# Patient Record
Sex: Male | Born: 1950
Health system: Southern US, Community
[De-identification: ages and names within clinical notes are randomized; demographics above are authoritative.]

## PROBLEM LIST (undated history)

## (undated) DIAGNOSIS — E785 Hyperlipidemia, unspecified: Secondary | ICD-10-CM

## (undated) DIAGNOSIS — M1990 Unspecified osteoarthritis, unspecified site: Secondary | ICD-10-CM

## (undated) DIAGNOSIS — I10 Essential (primary) hypertension: Secondary | ICD-10-CM

## (undated) DIAGNOSIS — T7840XA Allergy, unspecified, initial encounter: Secondary | ICD-10-CM

## (undated) HISTORY — PX: HERNIA REPAIR: SHX51

## (undated) HISTORY — DX: Allergy, unspecified, initial encounter: T78.40XA

## (undated) HISTORY — DX: Hyperlipidemia, unspecified: E78.5

## (undated) HISTORY — DX: Unspecified osteoarthritis, unspecified site: M19.90

---

## 2004-04-13 ENCOUNTER — Ambulatory Visit: Payer: Self-pay | Admitting: Internal Medicine

## 2004-05-13 ENCOUNTER — Ambulatory Visit: Payer: Self-pay | Admitting: Internal Medicine

## 2008-03-23 ENCOUNTER — Ambulatory Visit: Payer: Self-pay | Admitting: Internal Medicine

## 2008-03-24 ENCOUNTER — Ambulatory Visit: Payer: Self-pay | Admitting: *Deleted

## 2008-04-27 ENCOUNTER — Ambulatory Visit: Payer: Self-pay | Admitting: Internal Medicine

## 2008-04-27 ENCOUNTER — Encounter: Payer: Self-pay | Admitting: Family Medicine

## 2008-04-27 LAB — CONVERTED CEMR LAB
ALT: 18 units/L (ref 0–53)
AST: 17 units/L (ref 0–37)
Albumin: 4.9 g/dL (ref 3.5–5.2)
Alkaline Phosphatase: 71 units/L (ref 39–117)
BUN: 25 mg/dL — ABNORMAL HIGH (ref 6–23)
Basophils Absolute: 0 10*3/uL (ref 0.0–0.1)
Basophils Relative: 0 % (ref 0–1)
CO2: 21 meq/L (ref 19–32)
Calcium: 9.8 mg/dL (ref 8.4–10.5)
Chloride: 100 meq/L (ref 96–112)
Cholesterol: 268 mg/dL — ABNORMAL HIGH (ref 0–200)
Creatinine, Ser: 1.21 mg/dL (ref 0.40–1.50)
Eosinophils Absolute: 0.4 10*3/uL (ref 0.0–0.7)
Eosinophils Relative: 7 % — ABNORMAL HIGH (ref 0–5)
Glucose, Bld: 200 mg/dL — ABNORMAL HIGH (ref 70–99)
HCT: 40.6 % (ref 39.0–52.0)
HDL: 53 mg/dL (ref >39)
Hemoglobin: 14.1 g/dL (ref 13.0–17.0)
LDL Cholesterol: 191 mg/dL — ABNORMAL HIGH (ref 0–99)
Lymphocytes Relative: 46 % (ref 12–46)
Lymphs Abs: 2.6 10*3/uL (ref 0.7–4.0)
MCHC: 34.7 g/dL (ref 30.0–36.0)
MCV: 80.7 fL (ref 78.0–100.0)
Microalb, Ur: 1.04 mg/dL (ref 0.00–1.89)
Monocytes Absolute: 0.4 10*3/uL (ref 0.1–1.0)
Monocytes Relative: 7 % (ref 3–12)
Neutro Abs: 2.3 10*3/uL (ref 1.7–7.7)
Neutrophils Relative %: 40 % — ABNORMAL LOW (ref 43–77)
PSA: 2.27 ng/mL (ref 0.10–4.00)
Platelets: 345 10*3/uL (ref 150–400)
Potassium: 4.5 meq/L (ref 3.5–5.3)
RBC: 5.03 M/uL (ref 4.22–5.81)
RDW: 15 % (ref 11.5–15.5)
Sodium: 136 meq/L (ref 135–145)
TSH: 2.608 microintl units/mL (ref 0.350–4.50)
Total Bilirubin: 0.4 mg/dL (ref 0.3–1.2)
Total CHOL/HDL Ratio: 5.1
Total Protein: 7.9 g/dL (ref 6.0–8.3)
Triglycerides: 122 mg/dL (ref <150)
VLDL: 24 mg/dL (ref 0–40)
WBC: 5.8 10*3/uL (ref 4.0–10.5)

## 2008-05-13 ENCOUNTER — Emergency Department (HOSPITAL_COMMUNITY): Admission: EM | Admit: 2008-05-13 | Discharge: 2008-05-13 | Payer: Self-pay | Admitting: Family Medicine

## 2008-06-12 ENCOUNTER — Ambulatory Visit: Payer: Self-pay | Admitting: Internal Medicine

## 2008-06-29 ENCOUNTER — Ambulatory Visit: Payer: Self-pay | Admitting: Internal Medicine

## 2008-06-29 ENCOUNTER — Ambulatory Visit: Payer: Self-pay | Admitting: Family Medicine

## 2009-01-21 ENCOUNTER — Ambulatory Visit: Payer: Self-pay | Admitting: Internal Medicine

## 2009-02-17 ENCOUNTER — Ambulatory Visit: Payer: Self-pay | Admitting: Internal Medicine

## 2009-04-19 ENCOUNTER — Ambulatory Visit: Payer: Self-pay | Admitting: Internal Medicine

## 2009-09-16 ENCOUNTER — Ambulatory Visit: Payer: Self-pay | Admitting: Internal Medicine

## 2009-10-21 ENCOUNTER — Ambulatory Visit: Payer: Self-pay | Admitting: Internal Medicine

## 2009-10-21 LAB — CONVERTED CEMR LAB: Microalb, Ur: 11.65 mg/dL — ABNORMAL HIGH (ref 0.00–1.89)

## 2009-11-11 ENCOUNTER — Ambulatory Visit: Payer: Self-pay | Admitting: Internal Medicine

## 2009-11-11 LAB — CONVERTED CEMR LAB
BUN: 24 mg/dL — ABNORMAL HIGH (ref 6–23)
CO2: 24 meq/L (ref 19–32)
Calcium: 10.4 mg/dL (ref 8.4–10.5)
Chloride: 100 meq/L (ref 96–112)
Cholesterol: 196 mg/dL (ref 0–200)
Creatinine, Ser: 0.97 mg/dL (ref 0.40–1.50)
Glucose, Bld: 161 mg/dL — ABNORMAL HIGH (ref 70–99)
HDL: 65 mg/dL (ref >39)
LDL Cholesterol: 108 mg/dL — ABNORMAL HIGH (ref 0–99)
Magnesium: 1.9 mg/dL (ref 1.5–2.5)
Potassium: 4.4 meq/L (ref 3.5–5.3)
Sodium: 136 meq/L (ref 135–145)
Total CHOL/HDL Ratio: 3
Triglycerides: 114 mg/dL (ref <150)
VLDL: 23 mg/dL (ref 0–40)

## 2009-11-18 ENCOUNTER — Ambulatory Visit: Payer: Self-pay | Admitting: Internal Medicine

## 2010-01-20 ENCOUNTER — Ambulatory Visit: Payer: Self-pay | Admitting: Internal Medicine

## 2010-04-20 ENCOUNTER — Encounter (INDEPENDENT_AMBULATORY_CARE_PROVIDER_SITE_OTHER): Payer: Self-pay | Admitting: Internal Medicine

## 2010-04-20 LAB — CONVERTED CEMR LAB
BUN: 15 mg/dL (ref 6–23)
CO2: 23 meq/L (ref 19–32)
Calcium: 10.1 mg/dL (ref 8.4–10.5)
Chloride: 99 meq/L (ref 96–112)
Cholesterol: 177 mg/dL (ref 0–200)
Creatinine, Ser: 1.03 mg/dL (ref 0.40–1.50)
Glucose, Bld: 163 mg/dL — ABNORMAL HIGH (ref 70–99)
HDL: 108 mg/dL (ref >39)
Hgb A1c MFr Bld: 7 % — ABNORMAL HIGH (ref <5.7)
LDL Cholesterol: 50 mg/dL (ref 0–99)
Potassium: 4.2 meq/L (ref 3.5–5.3)
Pro B Natriuretic peptide (BNP): 8.5 pg/mL (ref 0.0–100.0)
Sodium: 135 meq/L (ref 135–145)
Total CHOL/HDL Ratio: 1.6
Triglycerides: 93 mg/dL (ref <150)
VLDL: 19 mg/dL (ref 0–40)

## 2011-06-14 ENCOUNTER — Emergency Department (HOSPITAL_COMMUNITY): Payer: Self-pay

## 2011-06-14 ENCOUNTER — Emergency Department (HOSPITAL_COMMUNITY)
Admission: EM | Admit: 2011-06-14 | Discharge: 2011-06-14 | Disposition: A | Discharge status: Home / Self Care | Payer: Self-pay | Attending: Emergency Medicine | Admitting: Emergency Medicine

## 2011-06-14 DIAGNOSIS — J3489 Other specified disorders of nose and nasal sinuses: Secondary | ICD-10-CM | POA: Insufficient documentation

## 2011-06-14 DIAGNOSIS — R05 Cough: Secondary | ICD-10-CM | POA: Insufficient documentation

## 2011-06-14 DIAGNOSIS — Z7982 Long term (current) use of aspirin: Secondary | ICD-10-CM | POA: Insufficient documentation

## 2011-06-14 DIAGNOSIS — J069 Acute upper respiratory infection, unspecified: Secondary | ICD-10-CM | POA: Insufficient documentation

## 2011-06-14 DIAGNOSIS — R51 Headache: Secondary | ICD-10-CM | POA: Insufficient documentation

## 2011-06-14 DIAGNOSIS — Z79899 Other long term (current) drug therapy: Secondary | ICD-10-CM | POA: Insufficient documentation

## 2011-06-14 DIAGNOSIS — J40 Bronchitis, not specified as acute or chronic: Secondary | ICD-10-CM | POA: Insufficient documentation

## 2011-06-14 DIAGNOSIS — R062 Wheezing: Secondary | ICD-10-CM | POA: Insufficient documentation

## 2011-06-14 DIAGNOSIS — R070 Pain in throat: Secondary | ICD-10-CM | POA: Insufficient documentation

## 2011-06-14 DIAGNOSIS — R599 Enlarged lymph nodes, unspecified: Secondary | ICD-10-CM | POA: Insufficient documentation

## 2011-06-14 DIAGNOSIS — E119 Type 2 diabetes mellitus without complications: Secondary | ICD-10-CM | POA: Insufficient documentation

## 2011-06-14 DIAGNOSIS — I10 Essential (primary) hypertension: Secondary | ICD-10-CM | POA: Insufficient documentation

## 2011-06-14 DIAGNOSIS — R059 Cough, unspecified: Secondary | ICD-10-CM | POA: Insufficient documentation

## 2011-06-14 HISTORY — DX: Essential (primary) hypertension: I10

## 2011-06-14 MED ORDER — ALBUTEROL SULFATE HFA 108 (90 BASE) MCG/ACT IN AERS: 2.0000 | INHALATION_SPRAY | RESPIRATORY_TRACT | Status: DC | PRN

## 2011-06-14 MED ORDER — AEROCHAMBER PLUS MISC: Status: AC

## 2011-06-14 NOTE — ED Notes (Signed)
Pt presents with 4-5 day  H/o productive cough with yellow phlegm at onset, but now is white.  +nasal congestion +chills, denies any body aches.

## 2011-06-14 NOTE — ED Notes (Signed)
No active coughing noted. No distress noted. Pt sitting on bed quietly

## 2011-06-14 NOTE — ED Provider Notes (Signed)
History     CSN: [id number] Arrival date & time: 06/14/2011  1:44 PM   First MD Initiated Contact with Patient 06/14/11 1702      Chief Complaint  Patient presents with  . Cough    (Consider location/radiation/quality/duration/timing/severity/associated sxs/prior treatment) Patient is a 60 y.o. male presenting with cough. The history is provided by the patient.  Cough This is a new problem. The current episode started more than 2 days ago (about 1 week ago). The problem occurs constantly. The problem has not changed since onset.The cough is productive of sputum. Maximum temperature: subjective. Associated symptoms include headaches and sore throat. Pertinent negatives include no chest pain, no chills, no myalgias and no shortness of breath. He has tried nothing for the symptoms. The treatment provided no relief.    Past Medical History  Diagnosis Date  . Diabetes mellitus   . Hypertension     History reviewed. No pertinent past surgical history.  No family history on file.  History  Substance Use Topics  . Smoking status: Never Smoker   . Smokeless tobacco: Not on file  . Alcohol Use: No      Review of Systems  Unable to perform ROS Constitutional: Negative for fever and chills.  HENT: Positive for congestion and sore throat. Negative for neck pain and neck stiffness.   Respiratory: Positive for cough. Negative for shortness of breath.   Cardiovascular: Negative for chest pain and palpitations.  Gastrointestinal: Negative for nausea, vomiting and abdominal pain.  Musculoskeletal: Negative for myalgias and arthralgias.  Skin: Negative for color change and rash.  Neurological: Positive for headaches. Negative for weakness and light-headedness.  Psychiatric/Behavioral: Negative for confusion and decreased concentration.  All other systems reviewed and are negative.    Allergies  Review of patient's allergies indicates no known allergies.  Home Medications    Current Outpatient Rx  Name Route Sig Dispense Refill  . AMLODIPINE BESYLATE 10 MG PO TABS Oral Take 10 mg by mouth daily.      . ASPIRIN EC 81 MG PO TBEC Oral Take 81 mg by mouth daily.      Marland Kitchen GLIPIZIDE ER 10 MG PO TB24 Oral Take 10 mg by mouth daily.      Marland Kitchen LOSARTAN POTASSIUM-HCTZ 50-12.5 MG PO TABS Oral Take 1 tablet by mouth daily.      Marland Kitchen METFORMIN HCL 1000 MG PO TABS Oral Take 1,000 mg by mouth 2 (two) times daily with a meal.      . PRAVASTATIN SODIUM 20 MG PO TABS Oral Take 20 mg by mouth daily.        BP 119/75  Pulse 99  Temp(Src) 99 F (37.2 C) (Oral)  Resp 18  SpO2 96%  Physical Exam  Nursing note and vitals reviewed. Constitutional: He is oriented to person, place, and time. He appears well-developed and well-nourished.  HENT:  Head: Normocephalic and atraumatic.  Eyes: EOM are normal. Pupils are equal, round, and reactive to light.  Neck: Normal range of motion. Neck supple.  Cardiovascular: Normal rate and regular rhythm.   Pulmonary/Chest: Effort normal. No respiratory distress. He has wheezes (mild, diffuse).  Abdominal: Soft. There is no tenderness.  Lymphadenopathy:    He has cervical adenopathy.  Neurological: He is alert and oriented to person, place, and time.  Skin: Skin is warm and dry.  Psychiatric: He has a normal mood and affect.    ED Course  Procedures (including critical care time)  Labs Reviewed - No data to  display Dg Chest 2 View  06/14/2011  *RADIOLOGY REPORT*  Clinical Data: Productive cough and chills.  High blood pressure. Diabetic.  Nonsmoker.  CHEST - 2 VIEW  Comparison: None.  Findings: Peribronchial thickening may represent bronchitis type changes without segmental consolidation.  Central pulmonary vascular prominence without pulmonary edema.  Biapical pleural thickening without associate bony destruction.  No pneumothorax.  Mild prominence aortic knob.  Heart size within normal limits.  IMPRESSION: Peribronchial thickening may  represent mild bronchitis type changes without evidence of a segmental infiltrate.  Original Report Authenticated By: Fuller Canada, M.D.     1. Bronchitis   2. URI (upper respiratory infection)       MDM  This is a 60 year old male who presents with about one week of gradually worsening productive cough, accompanied by nasal congestion, sore throat, subjective fevers, and intermittent headache. Denies any chest pain, shortness of breath, abdominal pain, nausea vomiting, neck pain or stiffness, or any other complaints. Exam is remarkable for mild wheezing diffusely, mild tender anterior cervical lymphadenopathy mild pharyngeal erythema, no neck stiffness or meningeal signs. Suspicion for probable viral syndrome, however due to a week of gradually worsening cough, will get a chest x-ray to evaluate for possible underlying pneumonia.  Chest x-ray is unremarkable. Discussed with patient symptomatic treatment, including use of inhaler for wheezing and persistent cough, and indications for return. Patient expresses understanding of this plan.      Theotis Burrow, MD 06/15/11 [phone]

## 2011-06-15 NOTE — ED Provider Notes (Signed)
I saw and evaluated the patient, reviewed the resident's note and I agree with the findings and plan.   Dione Booze, MD 06/15/11 1046

## 2012-06-13 ENCOUNTER — Encounter (HOSPITAL_COMMUNITY): Payer: Self-pay

## 2012-06-13 ENCOUNTER — Emergency Department (HOSPITAL_COMMUNITY)
Admission: EM | Admit: 2012-06-13 | Discharge: 2012-06-13 | Disposition: A | Discharge status: Home / Self Care | Payer: No Typology Code available for payment source | Source: Home / Self Care

## 2012-06-13 DIAGNOSIS — E119 Type 2 diabetes mellitus without complications: Secondary | ICD-10-CM

## 2012-06-13 DIAGNOSIS — I10 Essential (primary) hypertension: Secondary | ICD-10-CM

## 2012-06-13 MED ORDER — ASPIRIN EC 81 MG PO TBEC: 81.0000 mg | DELAYED_RELEASE_TABLET | Freq: Every day | ORAL | Status: DC

## 2012-06-13 MED ORDER — AMLODIPINE BESYLATE 10 MG PO TABS: 10.0000 mg | ORAL_TABLET | Freq: Every day | ORAL | Status: DC

## 2012-06-13 MED ORDER — METFORMIN HCL 1000 MG PO TABS: 1000.0000 mg | ORAL_TABLET | Freq: Two times a day (BID) | ORAL | Status: DC

## 2012-06-13 MED ORDER — LOSARTAN POTASSIUM-HCTZ 50-12.5 MG PO TABS: 1.0000 | ORAL_TABLET | Freq: Every day | ORAL | Status: DC

## 2012-06-13 MED ORDER — PRAVASTATIN SODIUM 20 MG PO TABS: 20.0000 mg | ORAL_TABLET | Freq: Every day | ORAL | Status: DC

## 2012-06-13 MED ORDER — GLIPIZIDE ER 10 MG PO TB24: 10.0000 mg | ORAL_TABLET | Freq: Every day | ORAL | Status: DC

## 2012-06-13 NOTE — ED Notes (Signed)
[facility] needing medication refill

## 2012-06-13 NOTE — ED Provider Notes (Signed)
Patient Demographics  Jerry Serrano, is a 61 y.o. male  WUJ:[id number]  NFA:[id number]  DOB - 27-Feb-1951  Chief Complaint  Patient presents with  . Medication Refill        Subjective:   Jerry Serrano is a 61 year old with history of hypertension diabetes and hyperlipidemia who presents for medication refill. Denies any complaints - No headache, No chest pain, No abdominal pain - No Nausea, No new weakness tingling or numbness, No Cough - SOB.   Objective:    Filed Vitals:   06/13/12 1642  BP: 121/82  Pulse: 75  Temp: 98.4 F (36.9 C)  TempSrc: Oral  Resp: 20  SpO2: 100%     Exam  Awake Alert, Oriented X 3, nonfocal, Normal affect [place].AT,PERRAL Supple Neck,No JVD, No cervical lymphadenopathy appriciated.  Symmetrical Chest wall movement, Good air movement bilaterally, CTAB RRR,No Gallops,Rubs or new Murmurs, No Parasternal Heave +ve B.Sounds, Abd Soft, Non tender, No organomegaly appreciated, No rebound - guarding or rigidity. No Cyanosis, Clubbing or edema, no rash    Data Review   CBC No results found for this basename: WBC:5,HGB:5,HCT:5,PLT:5,MCV:5,MCH:5,MCHC:5,RDW:5,NEUTRABS:5,LYMPHSABS:5,MONOABS:5,EOSABS:5,BASOSABS:5,BANDABS:5,BANDSABD:5 in the last 168 hours  Chemistries   No results found for this basename: NA:5,K:5,CL:5,CO2:5,GLUCOSE:5,BUN:5,CREATININE:5,GFRCGP,:5,CALCIUM:5,MG:5,AST:5,ALT:5,ALKPHOS:5,BILITOT:5 in the last 168 hours ------------------------------------------------------------------------------------------------------------------ No results found for this basename: HGBA1C:2 in the last 72 hours ------------------------------------------------------------------------------------------------------------------ No results found for this basename: CHOL:2,HDL:2,LDLCALC:2,TRIG:2,CHOLHDL:2,LDLDIRECT:2 in the last 72  hours ------------------------------------------------------------------------------------------------------------------ No results found for this basename: TSH,T4TOTAL,FREET3,T3FREE,THYROIDAB in the last 72 hours ------------------------------------------------------------------------------------------------------------------ No results found for this basename: VITAMINB12:2,FOLATE:2,FERRITIN:2,TIBC:2,IRON:2,RETICCTPCT:2 in the last 72 hours  Coagulation profile  No results found for this basename: INR:5,PROTIME:5 in the last 168 hours     Prior to Admission medications   Medication Sig Start Date End Date Taking? Authorizing Provider  albuterol (PROVENTIL HFA;VENTOLIN HFA) 108 (90 BASE) MCG/ACT inhaler Inhale 2 puffs into the lungs every 4 (four) hours as needed for wheezing or shortness of breath (cough). 06/14/11 06/13/12  Katie Mahoney-Tesoriero, MD  amLODipine (NORVASC) 10 MG tablet Take 1 tablet (10 mg total) by mouth daily. 06/13/12   Kela Millin, MD  aspirin EC 81 MG tablet Take 1 tablet (81 mg total) by mouth daily. 06/13/12   Kela Millin, MD  glipiZIDE (GLUCOTROL XL) 10 MG 24 hr tablet Take 1 tablet (10 mg total) by mouth daily. 06/13/12   Kela Millin, MD  losartan-hydrochlorothiazide (HYZAAR) 50-12.5 MG per tablet Take 1 tablet by mouth daily. 06/13/12   Kela Millin, MD  metFORMIN (GLUCOPHAGE) 1000 MG tablet Take 1 tablet (1,000 mg total) by mouth 2 (two) times daily with a meal. 06/13/12   Ercelle Winkles C Jonanthan Bolender, MD  pravastatin (PRAVACHOL) 20 MG tablet Take 1 tablet (20 mg total) by mouth daily. 06/13/12   Kela Millin, MD  Spacer/Aero-Holding Chambers (AEROCHAMBER PLUS) inhaler Use as instructed 06/14/11 06/13/12  Theotis Burrow, MD     Assessment & Plan   Hypertension -well controlled, continue current medication -I have refilled all his antihypertensives, his to follow up in 2 months with lab work Diabetes mellitus -Continue metformin, as above  followup as above Hyperlipidemia -Continue pravastatin Follow-up Information    Please follow up. (In 2 months at [facility] with labs)           Denver Faster.D on 06/13/2012 at 5:46 PM   Kela Millin, MD 06/13/12 1750

## 2012-08-02 MED ORDER — LOSARTAN POTASSIUM-HCTZ 50-12.5 MG PO TABS: 1.0000 | ORAL_TABLET | Freq: Every day | ORAL | Status: DC

## 2012-08-02 MED ORDER — METFORMIN HCL 1000 MG PO TABS: 1000.0000 mg | ORAL_TABLET | Freq: Two times a day (BID) | ORAL | Status: DC

## 2012-08-02 MED ORDER — PRAVASTATIN SODIUM 20 MG PO TABS: 20.0000 mg | ORAL_TABLET | Freq: Every day | ORAL | Status: DC

## 2012-08-02 MED ORDER — AMLODIPINE BESYLATE 10 MG PO TABS: 10.0000 mg | ORAL_TABLET | Freq: Every day | ORAL | Status: DC

## 2012-08-02 MED ORDER — GLIPIZIDE ER 10 MG PO TB24: 10.0000 mg | ORAL_TABLET | Freq: Every day | ORAL | Status: DC

## 2012-08-02 NOTE — Progress Notes (Signed)
Pt called requesting refills of meds to [facility] on [address].  Rxs sent to pharmacy for chronic meds.  Pt advised to followup in 1 month for regular medical recheck of chronic medical conditions.  I reviewed note from Dr. Donna Bernard.     Rodney Langton, MD, CDE, FAAFP [facility] [place], [place]

## 2012-08-05 MED ORDER — GLIPIZIDE ER 10 MG PO TB24: 10.0000 mg | ORAL_TABLET | Freq: Every day | ORAL | Status: DC

## 2012-08-05 MED ORDER — LOSARTAN POTASSIUM-HCTZ 50-12.5 MG PO TABS: 1.0000 | ORAL_TABLET | Freq: Every day | ORAL | Status: DC

## 2012-08-08 ENCOUNTER — Encounter (HOSPITAL_COMMUNITY): Payer: Self-pay

## 2012-08-08 ENCOUNTER — Emergency Department (INDEPENDENT_AMBULATORY_CARE_PROVIDER_SITE_OTHER)
Admission: EM | Admit: 2012-08-08 | Discharge: 2012-08-08 | Disposition: A | Discharge status: Home / Self Care | Payer: No Typology Code available for payment source | Source: Home / Self Care | Attending: Family Medicine | Admitting: Family Medicine

## 2012-08-08 DIAGNOSIS — Z23 Encounter for immunization: Secondary | ICD-10-CM

## 2012-08-08 DIAGNOSIS — E785 Hyperlipidemia, unspecified: Secondary | ICD-10-CM

## 2012-08-08 DIAGNOSIS — I10 Essential (primary) hypertension: Secondary | ICD-10-CM

## 2012-08-08 DIAGNOSIS — K029 Dental caries, unspecified: Secondary | ICD-10-CM

## 2012-08-08 LAB — LIPID PANEL
Cholesterol: 195 mg/dL (ref 0–200)
HDL: 54 mg/dL (ref >39)
LDL Cholesterol: 120 mg/dL — ABNORMAL HIGH (ref 0–99)
Total CHOL/HDL Ratio: 3.6 RATIO
Triglycerides: 104 mg/dL (ref <150)
VLDL: 21 mg/dL (ref 0–40)

## 2012-08-08 LAB — COMPREHENSIVE METABOLIC PANEL
ALT: 16 U/L (ref 0–53)
AST: 17 U/L (ref 0–37)
Albumin: 4.2 g/dL (ref 3.5–5.2)
Alkaline Phosphatase: 100 U/L (ref 39–117)
BUN: 10 mg/dL (ref 6–23)
CO2: 25 mEq/L (ref 19–32)
Calcium: 9.9 mg/dL (ref 8.4–10.5)
Chloride: 105 mEq/L (ref 96–112)
Creatinine, Ser: 0.72 mg/dL (ref 0.50–1.35)
GFR calc Af Amer: >90 mL/min (ref >90)
GFR calc non Af Amer: >90 mL/min (ref >90)
Glucose, Bld: 200 mg/dL — ABNORMAL HIGH (ref 70–99)
Potassium: 4.6 mEq/L (ref 3.5–5.1)
Sodium: 139 mEq/L (ref 135–145)
Total Bilirubin: 0.3 mg/dL (ref 0.3–1.2)
Total Protein: 7.8 g/dL (ref 6.0–8.3)

## 2012-08-08 MED ORDER — PRAVASTATIN SODIUM 20 MG PO TABS: 20.0000 mg | ORAL_TABLET | Freq: Every day | ORAL | Status: DC

## 2012-08-08 MED ORDER — GLIPIZIDE ER 10 MG PO TB24: 10.0000 mg | ORAL_TABLET | Freq: Every day | ORAL | Status: DC

## 2012-08-08 MED ORDER — INFLUENZA VIRUS VACC SPLIT PF IM SUSP
0.5000 mL | Freq: Once | INTRAMUSCULAR | Status: AC
  Administered 2012-08-08: 0.5 mL via INTRAMUSCULAR

## 2012-08-08 MED ORDER — LOSARTAN POTASSIUM-HCTZ 50-12.5 MG PO TABS: 1.0000 | ORAL_TABLET | Freq: Every day | ORAL | Status: DC

## 2012-08-08 MED ORDER — METFORMIN HCL 1000 MG PO TABS: 1000.0000 mg | ORAL_TABLET | Freq: Two times a day (BID) | ORAL | Status: DC

## 2012-08-08 MED ORDER — AMLODIPINE BESYLATE 10 MG PO TABS: 10.0000 mg | ORAL_TABLET | Freq: Every day | ORAL | Status: DC

## 2012-08-08 NOTE — ED Notes (Signed)
REFERRAL FAXED TO [facility] 

## 2012-08-08 NOTE — ED Provider Notes (Signed)
History   CSN: [id number]  Arrival date & time 08/08/12  1626   First MD Initiated Contact with Patient 08/08/12 1639     Chief Complaint  Patient presents with  . Medication Refill   HPI Pt presenting to follow up and to have his medications refilled.  Pt says that he has been pleased that his BP has remained well controlled on the medications.  His glycemic control has been good.  No hypoglycemia reported.   Pt reports that he would like to get a flu vaccine today.    Past Medical History  Diagnosis Date  . Diabetes mellitus   . Hypertension    History reviewed. No pertinent past surgical history.  No family history on file.  History  Substance Use Topics  . Smoking status: Never Smoker   . Smokeless tobacco: Not on file  . Alcohol Use: No    Review of Systems  Constitutional: Positive for fatigue.  HENT: Positive for congestion.   All other systems reviewed and are negative.    Allergies  Review of patient's allergies indicates no known allergies.  Home Medications   Current Outpatient Rx  Name  Route  Sig  Dispense  Refill  . ALBUTEROL SULFATE HFA 108 (90 BASE) MCG/ACT IN AERS   Inhalation   Inhale 2 puffs into the lungs every 4 (four) hours as needed for wheezing or shortness of breath (cough).   3.7 g   0   . AMLODIPINE BESYLATE 10 MG PO TABS   Oral   Take 1 tablet (10 mg total) by mouth daily.   30 tablet   3   . ASPIRIN EC 81 MG PO TBEC   Oral   Take 1 tablet (81 mg total) by mouth daily.   30 tablet   0   . GLIPIZIDE ER 10 MG PO TB24   Oral   Take 1 tablet (10 mg total) by mouth daily.   30 tablet   3   . LOSARTAN POTASSIUM-HCTZ 50-12.5 MG PO TABS   Oral   Take 1 tablet by mouth daily.   30 tablet   3   . METFORMIN HCL 1000 MG PO TABS   Oral   Take 1 tablet (1,000 mg total) by mouth 2 (two) times daily with a meal.   60 tablet   0   . PRAVASTATIN SODIUM 20 MG PO TABS   Oral   Take 1 tablet (20 mg total) by mouth daily.   30  tablet   3     BP 117/80  Pulse 76  Temp 97.8 F (36.6 C) (Oral)  Resp 18  SpO2 99%  Physical Exam  Nursing note and vitals reviewed. Constitutional: He is oriented to person, place, and time. He appears well-developed and well-nourished. No distress.  HENT:  Head: Normocephalic and atraumatic.  Right Ear: External ear normal.  Left Ear: External ear normal.  Mouth/Throat: Mucous membranes are normal. Abnormal dentition. Dental caries present. No posterior oropharyngeal erythema or tonsillar abscesses.  Eyes: Conjunctivae normal and EOM are normal. Pupils are equal, round, and reactive to light.  Neck: Normal range of motion. Neck supple. No JVD present. No thyromegaly present.  Cardiovascular: Normal rate, regular rhythm and normal heart sounds.   Pulmonary/Chest: Effort normal and breath sounds normal.  Abdominal: Soft. Bowel sounds are normal. He exhibits no distension and no mass. There is no tenderness. There is no rebound and no guarding.  Musculoskeletal: Normal range of motion.  Neurological:  He is alert and oriented to person, place, and time. He has normal reflexes.  Skin: Skin is warm and dry. No erythema.  Psychiatric: He has a normal mood and affect. His behavior is normal. Judgment and thought content normal.    ED Course  Procedures (including critical care time)  Labs Reviewed - No data to display No results found.   No diagnosis found.  MDM  IMPRESSION  Hypertension  Diabetes Mellitus  Hyperlipidemia  Dental Caries   RECOMMENDATIONS / PLAN Dental Referral made Check labs today Refilled medications Flu vaccine provided  FOLLOW UP 3 months   The patient was given clear instructions to go to ER or return to medical center if symptoms don't improve, worsen or new problems develop.  The patient verbalized understanding.  The patient was told to call to get lab results if they haven't heard anything in the next week.           Cleora Fleet, MD 08/08/12 1712

## 2012-08-08 NOTE — ED Notes (Signed)
Medication refill

## 2012-08-09 LAB — TSH: TSH: 2.122 u[IU]/mL (ref 0.350–4.500)

## 2012-08-09 LAB — HEMOGLOBIN A1C
Hgb A1c MFr Bld: 8.6 % — ABNORMAL HIGH (ref <5.7)
Mean Plasma Glucose: 200 mg/dL — ABNORMAL HIGH (ref <117)

## 2012-08-09 NOTE — Progress Notes (Signed)
Quick Note:  Please notify patient that his blood work came back revealing that his diabetes is poorly controlled as evidenced by hemoglobin A1c of 8.6%. This may be because the patient had been without his medications for a short time. We need to recheck his blood sugar and A1c in 3 months. Please send him some patient education information on diabetes diet plans and exercise plans and meal planning. His other labs came back okay except that his cholesterol was elevated. His LDL cholesterol was greater than 100. For diabetic patients the LDL cholesterol goal is less than 100. Please take the pravastatin regularly and we will recheck his lab work in 3 months.   Rodney Langton, MD, CDE, FAAFP [facility] [place], [place]   ______

## 2012-08-27 NOTE — ED Notes (Signed)
[facility] and pt. Stated he had no time for [place] and wanted off the list.

## 2012-12-09 ENCOUNTER — Ambulatory Visit: Payer: No Typology Code available for payment source | Attending: Family Medicine | Admitting: Family Medicine

## 2012-12-09 VITALS — BP 105/69 | HR 80 | Temp 98.4°F | Resp 17 | Wt 139.6 lb

## 2012-12-09 DIAGNOSIS — I10 Essential (primary) hypertension: Secondary | ICD-10-CM

## 2012-12-09 DIAGNOSIS — IMO0001 Reserved for inherently not codable concepts without codable children: Secondary | ICD-10-CM

## 2012-12-09 DIAGNOSIS — E119 Type 2 diabetes mellitus without complications: Secondary | ICD-10-CM | POA: Insufficient documentation

## 2012-12-09 DIAGNOSIS — E785 Hyperlipidemia, unspecified: Secondary | ICD-10-CM

## 2012-12-09 DIAGNOSIS — K029 Dental caries, unspecified: Secondary | ICD-10-CM

## 2012-12-09 MED ORDER — LOSARTAN POTASSIUM-HCTZ 50-12.5 MG PO TABS: 1.0000 | ORAL_TABLET | Freq: Every day | ORAL | Status: DC

## 2012-12-09 MED ORDER — GLIPIZIDE ER 10 MG PO TB24: 10.0000 mg | ORAL_TABLET | Freq: Every day | ORAL | Status: DC

## 2012-12-09 MED ORDER — METFORMIN HCL 1000 MG PO TABS: 1000.0000 mg | ORAL_TABLET | Freq: Two times a day (BID) | ORAL | Status: DC

## 2012-12-09 MED ORDER — PRAVASTATIN SODIUM 40 MG PO TABS: 40.0000 mg | ORAL_TABLET | Freq: Every day | ORAL | Status: DC

## 2012-12-09 MED ORDER — AMLODIPINE BESYLATE 10 MG PO TABS: 10.0000 mg | ORAL_TABLET | Freq: Every day | ORAL | Status: DC

## 2012-12-09 NOTE — Progress Notes (Signed)
Patient ID: Jerry Serrano, male   DOB: 1951-02-08, 62 y.o.   MRN: [id number]  CC: follow up DM, HTN  HPI: PT without complaints.  He did see a dentist.  His blood sugar has been good per pt.  He says BS was 108-130.  BP has been well controlled on meds.   No Known Allergies Past Medical History  Diagnosis Date  . Diabetes mellitus   . Hypertension    Current Outpatient Prescriptions on File Prior to Visit  Medication Sig Dispense Refill  . albuterol (PROVENTIL HFA;VENTOLIN HFA) 108 (90 BASE) MCG/ACT inhaler Inhale 2 puffs into the lungs every 4 (four) hours as needed for wheezing or shortness of breath (cough).  3.7 g  0  . aspirin EC 81 MG tablet Take 1 tablet (81 mg total) by mouth daily.  30 tablet  0   No current facility-administered medications on file prior to visit.   History reviewed. No pertinent family history. History   Social History  . Marital Status: Married    Spouse Name: N/A    Number of Children: N/A  . Years of Education: N/A   Occupational History  . Not on file.   Social History Main Topics  . Smoking status: Never Smoker   . Smokeless tobacco: Not on file  . Alcohol Use: No  . Drug Use: No  . Sexually Active:    Other Topics Concern  . Not on file   Social History Narrative  . No narrative on file    Review of Systems  Constitutional: Negative for fever, chills, diaphoresis, activity change, appetite change and fatigue.  HENT: Negative for ear pain, nosebleeds, congestion, facial swelling, rhinorrhea, neck pain, neck stiffness and ear discharge.   Eyes: Negative for pain, discharge, redness, itching and visual disturbance.  Respiratory: Negative for cough, choking, chest tightness, shortness of breath, wheezing and stridor.   Cardiovascular: Negative for chest pain, palpitations and leg swelling.  Gastrointestinal: Negative for abdominal distention.  Genitourinary: Negative for dysuria, urgency, frequency, hematuria, flank pain,  decreased urine volume, difficulty urinating and dyspareunia.  Musculoskeletal: Negative for back pain, joint swelling, arthralgias and gait problem.  Neurological: Negative for dizziness, tremors, seizures, syncope, facial asymmetry, speech difficulty, weakness, light-headedness, numbness and headaches.  Hematological: Negative for adenopathy. Does not bruise/bleed easily.  Psychiatric/Behavioral: Negative for hallucinations, behavioral problems, confusion, dysphoric mood, decreased concentration and agitation.    Objective:   Filed Vitals:   12/09/12 1746  BP: 105/69  Pulse: 80  Temp: 98.4 F (36.9 C)  Resp: 17    Physical Exam  Constitutional: Appears well-developed and well-nourished. No distress.  HENT: Normocephalic. External right and left ear normal. Oropharynx is clear and moist. dental caries Eyes: Conjunctivae and EOM are normal. PERRLA, no scleral icterus.  Neck: Normal ROM. Neck supple. No JVD. No tracheal deviation. No thyromegaly.  CVS: RRR, S1/S2 +, no murmurs, no gallops, no carotid bruit.  Pulmonary: Effort and breath sounds normal, no stridor, rhonchi, wheezes, rales.  Abdominal: Soft. BS +,  no distension, tenderness, rebound or guarding.  Musculoskeletal: Normal range of motion. No edema and no tenderness.  Lymphadenopathy: No lymphadenopathy noted, cervical, inguinal. Neuro: Alert. Normal reflexes, muscle tone coordination. No cranial nerve deficit. Skin: Skin is warm and dry. No rash noted. Not diaphoretic. No erythema. No pallor.  Psychiatric: Normal mood and affect. Behavior, judgment, thought content normal.   Lab Results  Component Value Date   WBC 5.8 04/27/2008   HGB 14.1 04/27/2008   HCT  40.6 04/27/2008   MCV 80.7 04/27/2008   PLT 345 04/27/2008   Lab Results  Component Value Date   CREATININE 0.72 08/08/2012   BUN 10 08/08/2012   NA 139 08/08/2012   K 4.6 08/08/2012   CL 105 08/08/2012   CO2 25 08/08/2012    Lab Results  Component Value Date    HGBA1C 8.6* 08/08/2012   Lipid Panel     Component Value Date/Time   CHOL 195 08/08/2012 1649   TRIG 104 08/08/2012 1649   HDL 54 08/08/2012 1649   CHOLHDL 3.6 08/08/2012 1649   VLDL 21 08/08/2012 1649   LDLCALC 120* 08/08/2012 1649       Assessment and plan:   Patient Active Problem List   Diagnosis Date Noted  . Unspecified essential hypertension 12/09/2012  . Type II or unspecified type diabetes mellitus without mention of complication, uncontrolled 12/09/2012  . Dental caries 12/09/2012   Refilled meds Check labs today Recommended pt see dentist   The patient was given clear instructions to go to ER or return to medical center if symptoms don't improve, worsen or new problems develop.  The patient verbalized understanding.  The patient was told to call to get lab results if they haven't heard anything in the next week.    Rodney Langton, MD, CDE, FAAFP [facility] [place], [place]

## 2012-12-09 NOTE — Patient Instructions (Addendum)
DASH Diet The DASH diet stands for "Dietary Approaches to Stop Hypertension." It is a healthy eating plan that has been shown to reduce high blood pressure (hypertension) in as little as 14 days, while also possibly providing other significant health benefits. These other health benefits include reducing the risk of breast cancer after menopause and reducing the risk of type 2 diabetes, heart disease, colon cancer, and stroke. Health benefits also include weight loss and slowing kidney failure in patients with chronic kidney disease.  DIET GUIDELINES  Limit salt (sodium). Your diet should contain less than 1500 mg of sodium daily.  Limit refined or processed carbohydrates. Your diet should include mostly whole grains. Desserts and added sugars should be used sparingly.  Include small amounts of heart-healthy fats. These types of fats include nuts, oils, and tub margarine. Limit saturated and trans fats. These fats have been shown to be harmful in the body. CHOOSING FOODS  The following food groups are based on a 2000 calorie diet. See your Registered Dietitian for individual calorie needs. Grains and Grain Products (6 to 8 servings daily)  Eat More Often: Whole-wheat bread, brown rice, whole-grain or wheat pasta, quinoa, popcorn without added fat or salt (air popped).  Eat Less Often: White bread, white pasta, white rice, cornbread. Vegetables (4 to 5 servings daily)  Eat More Often: Fresh, frozen, and canned vegetables. Vegetables may be raw, steamed, roasted, or grilled with a minimal amount of fat.  Eat Less Often/Avoid: Creamed or fried vegetables. Vegetables in a cheese sauce. Fruit (4 to 5 servings daily)  Eat More Often: All fresh, canned (in natural juice), or frozen fruits. Dried fruits without added sugar. One hundred percent fruit juice ( cup [237 mL] daily).  Eat Less Often: Dried fruits with added sugar. Canned fruit in light or heavy syrup. Foot LockerLean Meats, Fish, and Poultry (2  servings or less daily. One serving is 3 to 4 oz [85-114 g]).  Eat More Often: Ninety percent or leaner ground beef, tenderloin, sirloin. Round cuts of beef, chicken breast, [place]key breast. All fish. Grill, bake, or broil your meat. Nothing should be fried.  Eat Less Often/Avoid: Fatty cuts of meat, [place], or chicken leg, thigh, or wing. Fried cuts of meat or fish. Dairy (2 to 3 servings)  Eat More Often: Low-fat or fat-free milk, low-fat plain or light yogurt, reduced-fat or part-skim cheese.  Eat Less Often/Avoid: Milk (whole, 2%).Whole milk yogurt. Full-fat cheeses. Nuts, Seeds, and Legumes (4 to 5 servings per week)  Eat More Often: All without added salt.  Eat Less Often/Avoid: Salted nuts and seeds, canned beans with added salt. Fats and Sweets (limited)  Eat More Often: Vegetable oils, tub margarines without trans fats, sugar-free gelatin. Mayonnaise and salad dressings.  Eat Less Often/Avoid: Coconut oils, palm oils, butter, stick margarine, cream, half and half, cookies, candy, pie. FOR MORE INFORMATION The Dash Diet Eating Plan: www.dashdiet.org Document Released: 06/15/2011 Document Revised: 09/18/2011 Document Reviewed: 06/15/2011 [facility] Patient Information 2014 [address] [place], [place]. Hypoglycemia (Low Blood Sugar) Hypoglycemia is when the glucose (sugar) in your blood is too low. Hypoglycemia can happen for many reasons. It can happen to people with or without diabetes. Hypoglycemia can develop quickly and can be a medical emergency.  CAUSES  Having hypoglycemia does not mean that you will develop diabetes. Different causes include:  Missed or delayed meals or not enough carbohydrates eaten.  Medication overdose. This could be by accident or deliberate. If by accident, your medication may need to be adjusted or  changed.  Exercise or increased activity without adjustments in carbohydrates or medications.  A nerve disorder that affects body functions like your heart rate,  blood pressure and digestion (autonomic neuropathy).  A condition where the stomach muscles do not function properly (gastroparesis). Therefore, medications may not absorb properly.  The inability to recognize the signs of hypoglycemia (hypoglycemic unawareness).  Absorption of insulin  may be altered.  Alcohol consumption.  Pregnancy/menstrual cycles/postpartum. This may be due to hormones.  Certain kinds of tumors. This is very rare. SYMPTOMS   Sweating.  Hunger.  Dizziness.  Blurred vision.  Drowsiness.  Weakness.  Headache.  Rapid heart beat.  Shakiness.  Nervousness. DIAGNOSIS  Diagnosis is made by monitoring blood glucose in one or all of the following ways:  Fingerstick blood glucose monitoring.  Laboratory results. TREATMENT  If you think your blood glucose is low:  Check your blood glucose, if possible. If it is less than 70 mg/dl, take one of the following:  3-4 glucose tablets.   cup juice (prefer clear like apple).   cup "regular" soda pop.  1 cup milk.  -1 tube of glucose gel.  5-6 hard candies.  Do not over treat because your blood glucose (sugar) will only go too high.  Wait 15 minutes and recheck your blood glucose. If it is still less than 70 mg/dl (or below your target range), repeat treatment.  Eat a snack if it is more than one hour until your next meal. Sometimes, your blood glucose may go so low that you are unable to treat yourself. You may need someone to help you. You may even pass out or be unable to swallow. This may require you to get an injection of glucagon, which raises the blood glucose. HOME CARE INSTRUCTIONS  Check blood glucose as recommended by your caregiver.  Take medication as prescribed by your caregiver.  Follow your meal plan. Do not skip meals. Eat on time.  If you are going to drink alcohol, drink it only with meals.  Check your blood glucose before driving.  Check your blood glucose before and  after exercise. If you exercise longer or different than usual, be sure to check blood glucose more frequently.  Always carry treatment with you. Glucose tablets are the easiest to carry.  Always wear medical alert jewelry or carry some form of identification that states that you have diabetes. This will alert people that you have diabetes. If you have hypoglycemia, they will have a better idea on what to do. SEEK MEDICAL CARE IF:   You are having problems keeping your blood sugar at target range.  You are having frequent episodes of hypoglycemia.  You feel you might be having side effects from your medicines.  You have symptoms of an illness that is not improving after 3-4 days.  You notice a change in vision or a new problem with your vision. SEEK IMMEDIATE MEDICAL CARE IF:   You are a family member or friend of a person whose blood glucose goes below 70 mg/dl and is accompanied by:  Confusion.  A change in mental status.  The inability to swallow.  Passing out. Document Released: 06/26/2005 Document Revised: 09/18/2011 Document Reviewed: 10/23/2011 [facility] Patient Information 2014 [place], [place]. Blood Sugar Monitoring, Adult GLUCOSE METERS FOR SELF-MONITORING OF BLOOD GLUCOSE  It is important to be able to correctly measure your blood sugar (glucose). You can use a blood glucose monitor (a small battery-operated device) to check your glucose level at any time.  This allows you and your caregiver to monitor your diabetes and to determine how well your treatment plan is working. The process of monitoring your blood glucose with a glucose meter is called self-monitoring of blood glucose (SMBG). When people with diabetes control their blood sugar, they have better health. To test for glucose with a typical glucose meter, place the disposable strip in the meter. Then place a small sample of blood on the "test strip." The test strip is coated with chemicals that combine with glucose in  blood. The meter measures how much glucose is present. The meter displays the glucose level as a number. Several new models can record and store a number of test results. Some models can connect to personal computers to store test results or print them out.  Newer meters are often easier to use than older models. Some meters allow you to get blood from places other than your fingertip. Some new models have automatic timing, error codes, signals, or barcode readers to help with proper adjustment (calibration). Some meters have a large display screen or spoken instructions for people with visual impairments.  INSTRUCTIONS FOR USING GLUCOSE METERS  Wash your hands with soap and warm water, or clean the area with alcohol. Dry your hands completely.  Prick the side of your fingertip with a lancet (a sharp-pointed tool used by hand).  Hold the hand down and gently milk the finger until a small drop of blood appears. Catch the blood with the test strip.  Follow the instructions for inserting the test strip and using the SMBG meter. Most meters require the meter to be turned on and the test strip to be inserted before applying the blood sample.  Record the test result.  Read the instructions carefully for both the meter and the test strips that go with it. Meter instructions are found in the user manual. Keep this manual to help you solve any problems that may arise. Many meters use "error codes" when there is a problem with the meter, the test strip, or the blood sample on the strip. You will need the manual to understand these error codes and fix the problem.  New devices are available such as laser lancets and meters that can test blood taken from "alternative sites" of the body, other than fingertips. However, you should use standard fingertip testing if your glucose changes rapidly. Also, use standard testing if:  You have eaten, exercised, or taken insulin in the past 2 hours.  You think your  glucose is low.  You tend to not feel symptoms of low blood glucose (hypoglycemia).  You are ill or under stress.  Clean the meter as directed by the manufacturer.  Test the meter for accuracy as directed by the manufacturer.  Take your meter with you to your caregiver's office. This way, you can test your glucose in front of your caregiver to make sure you are using the meter correctly. Your caregiver can also take a sample of blood to test using a routine lab method. If values on the glucose meter are close to the lab results, you and your caregiver will see that your meter is working well and you are using good technique. Your caregiver will advise you about what to do if the results do not match. FREQUENCY OF TESTING  Your caregiver will tell you how often you should check your blood glucose. This will depend on your type of diabetes, your current level of diabetes control, and your types of  medicines. The following are general guidelines, but your care plan may be different. Record all your readings and the time of day you took them for review with your caregiver.   Diabetes type 1.  When you are using insulin with good diabetic control (either multiple daily injections or via a pump), you should check your glucose 4 times a day.  If your diabetes is not well controlled, you may need to monitor more frequently, including before meals and 2 hours after meals, at bedtime, and occasionally between 2 a.m. and 3 a.m.  You should always check your glucose before a dose of insulin or before changing the rate on your insulin pump.  Diabetes type 2.  Guidelines for SMBG in diabetes type 2 are not as well defined.  If you are on insulin, follow the guidelines above.  If you are on medicines, but not insulin, and your glucose is not well controlled, you should test at least twice daily.  If you are not on insulin, and your diabetes is controlled with medicines or diet alone, you should test  at least once daily, usually before breakfast.  A weekly profile will help your caregiver advise you on your care plan. The week before your visit, check your glucose before a meal and 2 hours after a meal at least daily. You may want to test before and after a different meal each day so you and your caregiver can tell how well controlled your blood sugars are throughout the course of a 24 hour period.  Gestational diabetes (diabetes during pregnancy).  Frequent testing is often necessary. Accurate timing is important.  If you are not on insulin, check your glucose 4 times a day. Check it before breakfast and 1 hour after the start of each meal.  If you are on insulin, check your glucose 6 times a day. Check it before each meal and 1 hour after the first bite of each meal.  General guidelines.  More frequent testing is required at the start of insulin treatment. Your caregiver will instruct you.  Test your glucose any time you suspect you have low blood sugar (hypoglycemia).  You should test more often when you change medicines, when you have unusual stress or illness, or in other unusual circumstances. OTHER THINGS TO KNOW ABOUT GLUCOSE METERS  Measurement Range. Most glucose meters are able to read glucose levels over a broad range of values from as low as 0 to as high as 600 mg/dL. If you get an extremely high or low reading from your meter, you should first confirm it with another reading. Report very high or very low readings to your caregiver.  Whole Blood Glucose versus Plasma Glucose. Some older home glucose meters measure glucose in your whole blood. In a lab or when using some newer home glucose meters, the glucose is measured in your plasma (one component of blood). The difference can be important. It is important for you and your caregiver to know whether your meter gives its results as "whole blood equivalent" or "plasma equivalent."  Display of High and Low Glucose Values. Part  of learning how to operate a meter is understanding what the meter results mean. Know how high and low glucose concentrations are displayed on your meter.  Factors that Affect Glucose Meter Performance. The accuracy of your test results depends on many factors and varies depending on the brand and type of meter. These factors include:  Low red blood cell count (anemia).  Substances in  your blood (such as uric acid, vitamin C, and others).  Environmental factors (temperature, humidity, altitude).  Name-brand versus generic test strips.  Calibration. Make sure your meter is set up properly. It is a good idea to do a calibration test with a control solution recommended by the manufacturer of your meter whenever you begin using a fresh bottle of test strips. This will help verify the accuracy of your meter.  Improperly stored, expired, or defective test strips. Keep your strips in a dry place with the lid on.  Soiled meter.  Inadequate blood sample. NEW TECHNOLOGIES FOR GLUCOSE TESTING Alternative site testing Some glucose meters allow testing blood from alternative sites. These include the:  Upper arm.  Forearm.  Base of the thumb.  Thigh. Sampling blood from alternative sites may be desirable. However, it may have some limitations. Blood in the fingertips show changes in glucose levels more quickly than blood in other parts of the body. This means that alternative site test results may be different from fingertip test results, not because of the meter's ability to test accurately, but because the actual glucose concentration can be different.  Continuous Glucose Monitoring Devices to measure your blood glucose continuously are available, and others are in development. These methods can be more expensive than self-monitoring with a glucose meter. However, it is uncertain how effective and reliable these devices are. Your caregiver will advise you if this approach makes sense for you. IF  BLOOD SUGARS ARE CONTROLLED, PEOPLE WITH DIABETES REMAIN HEALTHIER.  SMBG is an important part of the treatment plan of patients with diabetes mellitus. Below are reasons for using SMBG:   It confirms that your glucose is at a specific, healthy level.  It detects hypoglycemia and severe hyperglycemia.  It allows you and your caregiver to make adjustments in response to changes in lifestyle for individuals requiring medicine.  It determines the need for starting insulin therapy in temporary diabetes that happens during pregnancy (gestational diabetes). Document Released: 06/29/2003 Document Revised: 09/18/2011 Document Reviewed: 10/20/2010 [facility] Patient Information 2014 [place], [place].

## 2012-12-09 NOTE — Progress Notes (Signed)
Patient here for follow up History of DM  

## 2012-12-10 LAB — COMPREHENSIVE METABOLIC PANEL
ALT: 21 U/L (ref 0–53)
AST: 24 U/L (ref 0–37)
Albumin: 4.7 g/dL (ref 3.5–5.2)
Alkaline Phosphatase: 75 U/L (ref 39–117)
BUN: 15 mg/dL (ref 6–23)
CO2: 25 mEq/L (ref 19–32)
Calcium: 9.6 mg/dL (ref 8.4–10.5)
Chloride: 102 mEq/L (ref 96–112)
Creat: 0.82 mg/dL (ref 0.50–1.35)
Glucose, Bld: 126 mg/dL — ABNORMAL HIGH (ref 70–99)
Potassium: 4.2 mEq/L (ref 3.5–5.3)
Sodium: 134 mEq/L — ABNORMAL LOW (ref 135–145)
Total Bilirubin: 0.7 mg/dL (ref 0.3–1.2)
Total Protein: 7.5 g/dL (ref 6.0–8.3)

## 2012-12-10 LAB — LIPID PANEL
Cholesterol: 171 mg/dL (ref 0–200)
HDL: 50 mg/dL (ref >39)
LDL Cholesterol: 110 mg/dL — ABNORMAL HIGH (ref 0–99)
Total CHOL/HDL Ratio: 3.4 Ratio
Triglycerides: 53 mg/dL (ref <150)
VLDL: 11 mg/dL (ref 0–40)

## 2012-12-10 LAB — HEMOGLOBIN A1C
Hgb A1c MFr Bld: 7.2 % — ABNORMAL HIGH (ref <5.7)
Mean Plasma Glucose: 160 mg/dL — ABNORMAL HIGH (ref <117)

## 2012-12-10 LAB — TSH: TSH: 1.024 u[IU]/mL (ref 0.350–4.500)

## 2012-12-10 NOTE — Progress Notes (Signed)
Quick Note:  Please inform patient that his labs came back and show much improvement in his diabetes control. Please inform patient that other labs came back OK.  Rodney Langton, MD, CDE, FAAFP [facility] [place], [place]   ______

## 2012-12-11 ENCOUNTER — Telehealth: Payer: Self-pay | Admitting: *Deleted

## 2012-12-11 NOTE — Progress Notes (Signed)
12/11/12 Spoke with patient  Made aware that lab showed much improvement in his diabetes control and other labs  Were Okay. P.Haven Pylant,RN BSN [facility]

## 2013-01-20 ENCOUNTER — Ambulatory Visit: Payer: No Typology Code available for payment source

## 2013-01-27 ENCOUNTER — Ambulatory Visit: Payer: No Typology Code available for payment source | Attending: Family Medicine | Admitting: Internal Medicine

## 2013-01-27 ENCOUNTER — Encounter: Payer: Self-pay | Admitting: Internal Medicine

## 2013-01-27 VITALS — BP 127/77 | HR 88 | Temp 98.7°F | Resp 16 | Ht 63.0 in | Wt 147.4 lb

## 2013-01-27 DIAGNOSIS — K029 Dental caries, unspecified: Secondary | ICD-10-CM | POA: Insufficient documentation

## 2013-01-27 DIAGNOSIS — IMO0001 Reserved for inherently not codable concepts without codable children: Secondary | ICD-10-CM | POA: Insufficient documentation

## 2013-01-27 DIAGNOSIS — Z79899 Other long term (current) drug therapy: Secondary | ICD-10-CM | POA: Insufficient documentation

## 2013-01-27 DIAGNOSIS — I10 Essential (primary) hypertension: Secondary | ICD-10-CM | POA: Insufficient documentation

## 2013-01-27 NOTE — Progress Notes (Signed)
PT ONLY HERE FOR DENTAL REFERRAL. DENIES PAIN VSS

## 2013-01-27 NOTE — Progress Notes (Signed)
Patient ID: Jerry Serrano, male   DOB: 1950/12/27, 62 y.o.   MRN: [id number]   HPI: 62 year old patient here for followup on dental referral states that he has not yet been called for an appointment. He states he has cavities and one loose tooth. No Known Allergies Past Medical History  Diagnosis Date  . Diabetes mellitus   . Hypertension    Current Outpatient Prescriptions on File Prior to Visit  Medication Sig Dispense Refill  . amLODipine (NORVASC) 10 MG tablet Take 1 tablet (10 mg total) by mouth daily.  30 tablet  4  . aspirin EC 81 MG tablet Take 1 tablet (81 mg total) by mouth daily.  30 tablet  0  . glipiZIDE (GLUCOTROL XL) 10 MG 24 hr tablet Take 1 tablet (10 mg total) by mouth daily.  30 tablet  4  . losartan-hydrochlorothiazide (HYZAAR) 50-12.5 MG per tablet Take 1 tablet by mouth daily.  30 tablet  4  . metFORMIN (GLUCOPHAGE) 1000 MG tablet Take 1 tablet (1,000 mg total) by mouth 2 (two) times daily with a meal.  60 tablet  4  . pravastatin (PRAVACHOL) 40 MG tablet Take 1 tablet (40 mg total) by mouth daily.  30 tablet  4  . albuterol (PROVENTIL HFA;VENTOLIN HFA) 108 (90 BASE) MCG/ACT inhaler Inhale 2 puffs into the lungs every 4 (four) hours as needed for wheezing or shortness of breath (cough).  3.7 g  0   No current facility-administered medications on file prior to visit.   History reviewed. No pertinent family history. History   Social History  . Marital Status: Married    Spouse Name: N/A    Number of Children: N/A  . Years of Education: N/A   Occupational History  . Not on file.   Social History Main Topics  . Smoking status: Never Smoker   . Smokeless tobacco: Not on file  . Alcohol Use: No  . Drug Use: No  . Sexually Active:    Other Topics Concern  . Not on file   Social History Narrative  . No narrative on file    Review of Systems ______ Constitutional: Negative for fever, chills, diaphoresis, activity change, appetite change and fatigue.  ____ HENT: Negative for ear pain, nosebleeds, congestion, facial swelling, rhinorrhea, neck pain, neck stiffness and ear discharge.  ____ Eyes: Negative for pain, discharge, redness, itching and visual disturbance. ____ Respiratory: Negative for cough, choking, chest tightness, shortness of breath, wheezing and stridor.  ____ Cardiovascular: Negative for chest pain, palpitations and leg swelling. ____ Gastrointestinal: Negative for Nausea/ Vomiting/ Diarrhea or Consitpation Genitourinary: Negative for dysuria, urgency, frequency, hematuria, flank pain, decreased urine volume, difficulty urinating and dyspareunia. ____ Musculoskeletal: Negative for back pain, joint swelling, arthralgias and gait problem. ________ Neurological: Negative for dizziness, tremors, seizures, syncope, facial asymmetry, speech difficulty, weakness, light-headedness, numbness and headaches. ____ Hematological: Negative for adenopathy. Does not bruise/bleed easily. ____ Psychiatric/Behavioral: Negative for hallucinations, behavioral problems, confusion, dysphoric mood, decreased concentration and agitation. ______   Objective:   Filed Vitals:   01/27/13 1324  BP: 127/77  Pulse: 88  Temp: 98.7 F (37.1 C)  Resp: 16    Physical Exam ______ Constitutional: Appears well-developed and well-nourished. No distress. ____ HENT: Normocephalic. External right and left ear normal. Oropharynx is clear and moist. ____ Eyes: Conjunctivae and EOM are normal. PERRLA, no scleral icterus. ____ Neck: Normal ROM. Neck supple. No JVD. No tracheal deviation. No thyromegaly. ____ CVS: RRR, S1/S2 +, no murmurs, no gallops, no  carotid bruit.  Pulmonary: Effort and breath sounds normal, no stridor, rhonchi, wheezes, rales.  Abdominal: Soft. BS +,  no distension, tenderness, rebound or guarding. ________ Musculoskeletal: Normal range of motion. No edema and no tenderness. ____ Lymphadenopathy: No lymphadenopathy noted, cervical,  inguinal. Neuro: Alert. Normal reflexes, muscle tone coordination. No cranial nerve deficit. Skin: Skin is warm and dry. No rash noted. Not diaphoretic. No erythema. No pallor. ____ Psychiatric: Normal mood and affect. Behavior, judgment, thought content normal. __  Lab Results  Component Value Date   WBC 5.8 04/27/2008   HGB 14.1 04/27/2008   HCT 40.6 04/27/2008   MCV 80.7 04/27/2008   PLT 345 04/27/2008   Lab Results  Component Value Date   CREATININE 0.82 12/09/2012   BUN 15 12/09/2012   NA 134* 12/09/2012   K 4.2 12/09/2012   CL 102 12/09/2012   CO2 25 12/09/2012    Lab Results  Component Value Date   HGBA1C 7.2* 12/09/2012   Lipid Panel     Component Value Date/Time   CHOL 171 12/09/2012 1815   TRIG 53 12/09/2012 1815   HDL 50 12/09/2012 1815   CHOLHDL 3.4 12/09/2012 1815   VLDL 11 12/09/2012 1815   LDLCALC 110* 12/09/2012 1815       Assessment and plan:   Patient Active Problem List   Diagnosis Date Noted  . Unspecified essential hypertension 12/09/2012  . Type II or unspecified type diabetes mellitus without mention of complication, uncontrolled 12/09/2012  . Dental caries 12/09/2012    Dental referral ordered.

## 2013-03-07 ENCOUNTER — Other Ambulatory Visit: Payer: Self-pay | Admitting: Family Medicine

## 2013-03-11 NOTE — Telephone Encounter (Signed)
Medication refill-glipizide

## 2013-04-10 ENCOUNTER — Encounter: Payer: Self-pay | Admitting: Internal Medicine

## 2013-04-10 ENCOUNTER — Ambulatory Visit: Payer: No Typology Code available for payment source | Attending: Internal Medicine | Admitting: Internal Medicine

## 2013-04-10 VITALS — BP 117/77 | HR 74 | Temp 98.0°F | Resp 16 | Ht 64.0 in | Wt 146.0 lb

## 2013-04-10 DIAGNOSIS — E119 Type 2 diabetes mellitus without complications: Secondary | ICD-10-CM

## 2013-04-10 MED ORDER — METFORMIN HCL 1000 MG PO TABS: 1000.0000 mg | ORAL_TABLET | Freq: Two times a day (BID) | ORAL | Status: DC

## 2013-04-10 MED ORDER — AMLODIPINE BESYLATE 10 MG PO TABS: 10.0000 mg | ORAL_TABLET | Freq: Every day | ORAL | Status: DC

## 2013-04-10 MED ORDER — PRAVASTATIN SODIUM 40 MG PO TABS: 40.0000 mg | ORAL_TABLET | Freq: Every day | ORAL | Status: DC

## 2013-04-10 MED ORDER — GLIPIZIDE ER 10 MG PO TB24: 10.0000 mg | ORAL_TABLET | Freq: Every day | ORAL | Status: DC

## 2013-04-10 MED ORDER — LOSARTAN POTASSIUM-HCTZ 50-12.5 MG PO TABS: 1.0000 | ORAL_TABLET | Freq: Every day | ORAL | Status: DC

## 2013-04-10 MED ORDER — ASPIRIN EC 81 MG PO TBEC: 81.0000 mg | DELAYED_RELEASE_TABLET | Freq: Every day | ORAL | Status: DC

## 2013-04-10 NOTE — Progress Notes (Signed)
Patient ID: Jerry Serrano, male   DOB: [birth year], 62 y.o.   MRN: [id number]  Patient Demographics  Jerry Serrano, is a 62 y.o. male  AOZ:[id number]  NGE:[id number]  DOB - [birth year]  No chief complaint on file.       Subjective:   Jerry Serrano with History of diabetes mellitus type 2, hypertension is here for routine treatment and incentive get A1c checked has no subjective complaints  Denies any subjective complaints except as above, no active headache, no chest abdominal pain at this time, not short of breath. No focal weakness which is new.    Objective:    Patient Active Problem List   Diagnosis Date Noted  . Unspecified essential hypertension 12/09/2012  . Type II or unspecified type diabetes mellitus without mention of complication, uncontrolled 12/09/2012  . Dental caries 12/09/2012     There were no vitals filed for this visit.   Exam   Awake Alert, Oriented X 3, No new F.N deficits, Normal affect [place].AT,PERRAL Supple Neck,No JVD, No cervical lymphadenopathy appriciated.  Symmetrical Chest wall movement, Good air movement bilaterally, CTAB RRR,No Gallops,Rubs or new Murmurs, No Parasternal Heave +ve B.Sounds, Abd Soft, Non tender, No organomegaly appriciated, No rebound - guarding or rigidity. No Cyanosis, Clubbing or edema, No new Rash or bruise       Data Review   CBC No results found for this basename: WBC, HGB, HCT, PLT, MCV, MCH, MCHC, RDW, NEUTRABS, LYMPHSABS, MONOABS, EOSABS, BASOSABS, BANDABS, BANDSABD,  in the last 168 hours  Chemistries   No results found for this basename: NA, K, CL, CO2, GLUCOSE, BUN, CREATININE, GFRCGP, CALCIUM, MG, AST, ALT, ALKPHOS, BILITOT,  in the last 168 hours ------------------------------------------------------------------------------------------------------------------ No results found for this basename: HGBA1C,  in the last 72  hours ------------------------------------------------------------------------------------------------------------------ No results found for this basename: CHOL, HDL, LDLCALC, TRIG, CHOLHDL, LDLDIRECT,  in the last 72 hours ------------------------------------------------------------------------------------------------------------------ No results found for this basename: TSH, T4TOTAL, FREET3, T3FREE, THYROIDAB,  in the last 72 hours ------------------------------------------------------------------------------------------------------------------ No results found for this basename: VITAMINB12, FOLATE, FERRITIN, TIBC, IRON, RETICCTPCT,  in the last 72 hours  Coagulation profile  No results found for this basename: INR, PROTIME,  in the last 168 hours     Prior to Admission medications   Medication Sig Start Date End Date Taking? Authorizing Provider  albuterol (PROVENTIL HFA;VENTOLIN HFA) 108 (90 BASE) MCG/ACT inhaler Inhale 2 puffs into the lungs every 4 (four) hours as needed for wheezing or shortness of breath (cough). 06/14/11 06/13/12  Katie Mahoney-Tesoriero, MD  amLODipine (NORVASC) 10 MG tablet Take 1 tablet (10 mg total) by mouth daily. 04/10/13   Leroy Sea, MD  aspirin EC 81 MG tablet Take 1 tablet (81 mg total) by mouth daily. 04/10/13   Leroy Sea, MD  glipiZIDE (GLIPIZIDE XL) 10 MG 24 hr tablet Take 1 tablet (10 mg total) by mouth daily. 04/10/13   Leroy Sea, MD  losartan-hydrochlorothiazide (HYZAAR) 50-12.5 MG per tablet Take 1 tablet by mouth daily. 04/10/13   Leroy Sea, MD  metFORMIN (GLUCOPHAGE) 1000 MG tablet Take 1 tablet (1,000 mg total) by mouth 2 (two) times daily with a meal. 04/10/13   Leroy Sea, MD  pravastatin (PRAVACHOL) 40 MG tablet Take 1 tablet (40 mg total) by mouth daily. 04/10/13   Leroy Sea, MD     Assessment & Plan    Diabetes mellitus type 2. A1c ordered. Continue home medications get him back in 2  weeks  Hypertension stable. He is on diuretic and ACE Will check BMP     Routine health maintenance.  A1c along with BMP rechecked  Referred for GI made patient is due for colonoscopy

## 2013-05-05 ENCOUNTER — Ambulatory Visit: Payer: No Typology Code available for payment source | Attending: Internal Medicine | Admitting: Internal Medicine

## 2013-05-05 VITALS — BP 134/87 | HR 86 | Temp 98.4°F | Resp 16 | Wt 148.0 lb

## 2013-05-05 DIAGNOSIS — E1110 Type 2 diabetes mellitus with ketoacidosis without coma: Secondary | ICD-10-CM

## 2013-05-05 DIAGNOSIS — E119 Type 2 diabetes mellitus without complications: Secondary | ICD-10-CM | POA: Insufficient documentation

## 2013-05-05 DIAGNOSIS — I10 Essential (primary) hypertension: Secondary | ICD-10-CM | POA: Insufficient documentation

## 2013-05-05 DIAGNOSIS — E1310 Other specified diabetes mellitus with ketoacidosis without coma: Secondary | ICD-10-CM

## 2013-05-05 LAB — CBC WITH DIFFERENTIAL/PLATELET
Basophils Absolute: 0 10*3/uL (ref 0.0–0.1)
Basophils Relative: 0 % (ref 0–1)
Eosinophils Absolute: 0.4 10*3/uL (ref 0.0–0.7)
Eosinophils Relative: 5 % (ref 0–5)
HCT: 37.8 % — ABNORMAL LOW (ref 39.0–52.0)
Hemoglobin: 13.5 g/dL (ref 13.0–17.0)
Lymphocytes Relative: 37 % (ref 12–46)
Lymphs Abs: 2.9 10*3/uL (ref 0.7–4.0)
MCH: 28 pg (ref 26.0–34.0)
MCHC: 35.7 g/dL (ref 30.0–36.0)
MCV: 78.4 fL (ref 78.0–100.0)
Monocytes Absolute: 0.7 10*3/uL (ref 0.1–1.0)
Monocytes Relative: 9 % (ref 3–12)
Neutro Abs: 3.7 10*3/uL (ref 1.7–7.7)
Neutrophils Relative %: 49 % (ref 43–77)
Platelets: 364 10*3/uL (ref 150–400)
RBC: 4.82 MIL/uL (ref 4.22–5.81)
RDW: 15.2 % (ref 11.5–15.5)
WBC: 7.6 10*3/uL (ref 4.0–10.5)

## 2013-05-05 LAB — POCT GLYCOSYLATED HEMOGLOBIN (HGB A1C): Hemoglobin A1C: 8.1

## 2013-05-05 MED ORDER — LOSARTAN POTASSIUM-HCTZ 50-12.5 MG PO TABS: 1.0000 | ORAL_TABLET | Freq: Every day | ORAL | Status: DC

## 2013-05-05 MED ORDER — METFORMIN HCL 1000 MG PO TABS: 1000.0000 mg | ORAL_TABLET | Freq: Two times a day (BID) | ORAL | Status: DC

## 2013-05-05 NOTE — Addendum Note (Signed)
Addended by: Dorothea Ogle on: 05/05/2013 11:28 AM   Modules accepted: Orders

## 2013-05-05 NOTE — Progress Notes (Signed)
Patient here for follow up DM 

## 2013-05-05 NOTE — Progress Notes (Signed)
Patient ID: Jerry Serrano, male   DOB: 1951-06-10, 62 y.o.   MRN: [id number]   CC: follow up  HPI: patient is 62 year old very pleasant gentleman who comes to clinic for followup on diabetes and blood pressure. He was told he needs to come into her blood tests checked. He reports compliance with his medicines, checks blood pressure and sugar levels regularly. He has brought a log book of the monitoring with him. Numbers indicate CBG is less than 150 on daily basis and systolic blood pressure less than 130 on daily basis. Patient denies chest pain or shortness of breath, no recent sicknesses or hospitalizations, no abdominal or urinary concerns.    No Known Allergies Past Medical History  Diagnosis Date  . Diabetes mellitus   . Hypertension    Current Outpatient Prescriptions on File Prior to Visit  Medication Sig Dispense Refill  . albuterol (PROVENTIL HFA;VENTOLIN HFA) 108 (90 BASE) MCG/ACT inhaler Inhale 2 puffs into the lungs every 4 (four) hours as needed for wheezing or shortness of breath (cough).  3.7 g  0  . amLODipine (NORVASC) 10 MG tablet Take 1 tablet (10 mg total) by mouth daily.  30 tablet  4  . aspirin EC 81 MG tablet Take 1 tablet (81 mg total) by mouth daily.  30 tablet  0  . glipiZIDE (GLIPIZIDE XL) 10 MG 24 hr tablet Take 1 tablet (10 mg total) by mouth daily.  30 tablet  3  . losartan-hydrochlorothiazide (HYZAAR) 50-12.5 MG per tablet Take 1 tablet by mouth daily.  30 tablet  3  . metFORMIN (GLUCOPHAGE) 1000 MG tablet Take 1 tablet (1,000 mg total) by mouth 2 (two) times daily with a meal.  60 tablet  4  . pravastatin (PRAVACHOL) 40 MG tablet Take 1 tablet (40 mg total) by mouth daily.  30 tablet  3   No current facility-administered medications on file prior to visit.   No known family medical history  History   Social History  . Marital Status: Married    Spouse Name: N/A    Number of Children: N/A  . Years of Education: N/A   Occupational History  . Not  on file.   Social History Main Topics  . Smoking status: Never Smoker   . Smokeless tobacco: Not on file  . Alcohol Use: No  . Drug Use: No  . Sexual Activity:    Other Topics Concern  . Not on file   Social History Narrative  . No narrative on file    Review of Systems  Constitutional: Negative for fever, chills, diaphoresis, activity change, appetite change and fatigue.  HENT: Negative for ear pain, nosebleeds, congestion, facial swelling, rhinorrhea, neck pain, neck stiffness and ear discharge.   Eyes: Negative for pain, discharge, redness, itching and visual disturbance.  Respiratory: Negative for cough, choking, chest tightness, shortness of breath, wheezing and stridor.   Cardiovascular: Negative for chest pain, palpitations and leg swelling.  Gastrointestinal: Negative for abdominal distention.  Genitourinary: Negative for dysuria, urgency, frequency, hematuria, flank pain, decreased urine volume, difficulty urinating and dyspareunia.  Musculoskeletal: Negative for back pain, joint swelling, arthralgias and gait problem.  Neurological: Negative for dizziness, tremors, seizures, syncope, facial asymmetry, speech difficulty, weakness, light-headedness, numbness and headaches.  Hematological: Negative for adenopathy. Does not bruise/bleed easily.  Psychiatric/Behavioral: Negative for hallucinations, behavioral problems, confusion, dysphoric mood, decreased concentration and agitation.    Objective:   Filed Vitals:   05/05/13 1051  BP: 134/87  Pulse: 86  Temp:  98.4 F (36.9 C)  Resp: 16    Physical Exam  Constitutional: Appears well-developed and well-nourished. No distress.  CVS: RRR, S1/S2 +, no murmurs, no gallops, no carotid bruit.  Pulmonary: Effort and breath sounds normal, no stridor, rhonchi, wheezes, rales.  Abdominal: Soft. BS +,  no distension, tenderness, rebound or guarding.    Lab Results  Component Value Date   WBC 5.8 04/27/2008   HGB 14.1  04/27/2008   HCT 40.6 04/27/2008   MCV 80.7 04/27/2008   PLT 345 04/27/2008   Lab Results  Component Value Date   CREATININE 0.82 12/09/2012   BUN 15 12/09/2012   NA 134* 12/09/2012   K 4.2 12/09/2012   CL 102 12/09/2012   CO2 25 12/09/2012    Lab Results  Component Value Date   HGBA1C 8.1 05/05/2013   Lipid Panel     Component Value Date/Time   CHOL 171 12/09/2012 1815   TRIG 53 12/09/2012 1815   HDL 50 12/09/2012 1815   CHOLHDL 3.4 12/09/2012 1815   VLDL 11 12/09/2012 1815   LDLCALC 110* 12/09/2012 1815       Assessment and plan:   Patient Active Problem List   Diagnosis Date Noted  . Unspecified essential hypertension - we have discussed target blood pressure goal and patient and has shown lockable call his monitoring. I have advised continuing compliance with medicines. We'll check electrolyte panel today.  12/09/2012  . Type II or unspecified type diabetes mellitus without mention of complication, uncontrolled - A1c is 8.1. We have discussed meaning of A1c and importance of regular checkup every 3 months. Patient advised to stay compliant with recommended exercise, dietary restrictions, medicines. We will continue same regimen for now and patient advised to come back in 3 months. If A1c continues to trend down we'll plan on readjust the regimen as indicated.  12/09/2012

## 2013-05-05 NOTE — Patient Instructions (Signed)

## 2013-05-06 LAB — COMPREHENSIVE METABOLIC PANEL
ALT: 17 U/L (ref 0–53)
AST: 18 U/L (ref 0–37)
Albumin: 4.7 g/dL (ref 3.5–5.2)
Alkaline Phosphatase: 87 U/L (ref 39–117)
BUN: 16 mg/dL (ref 6–23)
CO2: 24 mEq/L (ref 19–32)
Calcium: 9.8 mg/dL (ref 8.4–10.5)
Chloride: 101 mEq/L (ref 96–112)
Creat: 0.86 mg/dL (ref 0.50–1.35)
Glucose, Bld: 157 mg/dL — ABNORMAL HIGH (ref 70–99)
Potassium: 4.7 mEq/L (ref 3.5–5.3)
Sodium: 134 mEq/L — ABNORMAL LOW (ref 135–145)
Total Bilirubin: 0.3 mg/dL (ref 0.3–1.2)
Total Protein: 7.1 g/dL (ref 6.0–8.3)

## 2013-05-06 LAB — LIPID PANEL
Cholesterol: 161 mg/dL (ref 0–200)
HDL: 45 mg/dL (ref >39)
LDL Cholesterol: 95 mg/dL (ref 0–99)
Total CHOL/HDL Ratio: 3.6 Ratio
Triglycerides: 103 mg/dL (ref <150)
VLDL: 21 mg/dL (ref 0–40)

## 2013-05-06 LAB — TSH: TSH: 2.254 u[IU]/mL (ref 0.350–4.500)

## 2013-07-17 ENCOUNTER — Ambulatory Visit: Payer: Self-pay

## 2013-07-18 ENCOUNTER — Ambulatory Visit: Payer: 59 | Attending: Internal Medicine | Admitting: Internal Medicine

## 2013-07-18 ENCOUNTER — Encounter: Payer: Self-pay | Admitting: Internal Medicine

## 2013-07-18 VITALS — BP 131/78 | HR 74 | Temp 98.0°F | Resp 16 | Wt 149.2 lb

## 2013-07-18 DIAGNOSIS — E119 Type 2 diabetes mellitus without complications: Secondary | ICD-10-CM | POA: Insufficient documentation

## 2013-07-18 DIAGNOSIS — R059 Cough, unspecified: Secondary | ICD-10-CM

## 2013-07-18 DIAGNOSIS — I10 Essential (primary) hypertension: Secondary | ICD-10-CM | POA: Insufficient documentation

## 2013-07-18 DIAGNOSIS — E785 Hyperlipidemia, unspecified: Secondary | ICD-10-CM | POA: Insufficient documentation

## 2013-07-18 DIAGNOSIS — R05 Cough: Secondary | ICD-10-CM

## 2013-07-18 LAB — GLUCOSE, POCT (MANUAL RESULT ENTRY): POC Glucose: 151 mg/dl — AB (ref 70–99)

## 2013-07-18 LAB — POCT GLYCOSYLATED HEMOGLOBIN (HGB A1C): Hemoglobin A1C: 9

## 2013-07-18 MED ORDER — AMLODIPINE BESYLATE 10 MG PO TABS: 10.0000 mg | ORAL_TABLET | Freq: Every day | ORAL | Status: DC

## 2013-07-18 MED ORDER — PRAVASTATIN SODIUM 40 MG PO TABS: 40.0000 mg | ORAL_TABLET | Freq: Every day | ORAL | Status: DC

## 2013-07-18 MED ORDER — BENZONATATE 100 MG PO CAPS: 100.0000 mg | ORAL_CAPSULE | Freq: Three times a day (TID) | ORAL | Status: DC | PRN

## 2013-07-18 MED ORDER — GLIPIZIDE ER 10 MG PO TB24: 10.0000 mg | ORAL_TABLET | Freq: Two times a day (BID) | ORAL | Status: DC

## 2013-07-18 MED ORDER — GLUCOSE BLOOD VI STRP: ORAL_STRIP | Status: DC

## 2013-07-18 MED ORDER — GLIPIZIDE ER 10 MG PO TB24: 10.0000 mg | ORAL_TABLET | Freq: Every day | ORAL | Status: DC

## 2013-07-18 NOTE — Progress Notes (Signed)
MRN: [id number] Name: Jerry Serrano  Sex: male Age: 63 y.o. DOB: [birth year]  Allergies: Review of patient's allergies indicates no known allergies.  Chief Complaint  Patient presents with  . Follow-up    HPI: Patient is 63 y.o. male who comes today for followup her has history of diabetes hypertension hyperlipidemia, is compliant with his medications and is requesting refill, but reported to have some cough denies any headache dizziness chest and shortness of breath any stuffy nose runny nose.  Past Medical History  Diagnosis Date  . Diabetes mellitus   . Hypertension     History reviewed. No pertinent past surgical history.    Medication List       This list is accurate as of: 07/18/13 11:14 AM.  Always use your most recent med list.               albuterol 108 (90 BASE) MCG/ACT inhaler  Commonly known as:  PROVENTIL HFA;VENTOLIN HFA  Inhale 2 puffs into the lungs every 4 (four) hours as needed for wheezing or shortness of breath (cough).     amLODipine 10 MG tablet  Commonly known as:  NORVASC  Take 1 tablet (10 mg total) by mouth daily.     aspirin EC 81 MG tablet  Take 1 tablet (81 mg total) by mouth daily.     benzonatate 100 MG capsule  Commonly known as:  TESSALON  Take 1 capsule (100 mg total) by mouth 3 (three) times daily as needed for cough.     glipiZIDE 10 MG 24 hr tablet  Commonly known as:  GLIPIZIDE XL  Take 1 tablet (10 mg total) by mouth 2 (two) times daily.     glucose blood test strip  Use as instructed     losartan-hydrochlorothiazide 50-12.5 MG per tablet  Commonly known as:  HYZAAR  Take 1 tablet by mouth daily.     metFORMIN 1000 MG tablet  Commonly known as:  GLUCOPHAGE  Take 1 tablet (1,000 mg total) by mouth 2 (two) times daily with a meal.     pravastatin 40 MG tablet  Commonly known as:  PRAVACHOL  Take 1 tablet (40 mg total) by mouth daily.        Meds ordered this encounter  Medications  . amLODipine (NORVASC) 10  MG tablet    Sig: Take 1 tablet (10 mg total) by mouth daily.    Dispense:  30 tablet    Refill:  4  . DISCONTD: glipiZIDE (GLIPIZIDE XL) 10 MG 24 hr tablet    Sig: Take 1 tablet (10 mg total) by mouth daily.    Dispense:  30 tablet    Refill:  3  . pravastatin (PRAVACHOL) 40 MG tablet    Sig: Take 1 tablet (40 mg total) by mouth daily.    Dispense:  30 tablet    Refill:  3  . glipiZIDE (GLIPIZIDE XL) 10 MG 24 hr tablet    Sig: Take 1 tablet (10 mg total) by mouth 2 (two) times daily.    Dispense:  60 tablet    Refill:  3  . benzonatate (TESSALON) 100 MG capsule    Sig: Take 1 capsule (100 mg total) by mouth 3 (three) times daily as needed for cough.    Dispense:  30 capsule    Refill:  1  . glucose blood test strip    Sig: Use as instructed    Dispense:  100 each    Refill:  12  Immunization History  Administered Date(s) Administered  . Influenza Split 08/08/2012    History reviewed. No pertinent family history.  History  Substance Use Topics  . Smoking status: Never Smoker   . Smokeless tobacco: Not on file  . Alcohol Use: No    Review of Systems  As noted in HPI  Filed Vitals:   07/18/13 1058  BP: 131/78  Pulse: 74  Temp: 98 F (36.7 C)  Resp: 16    Physical Exam  Physical Exam  Constitutional: No distress.  Eyes: EOM are normal. Pupils are equal, round, and reactive to light.  Cardiovascular: Normal rate and regular rhythm.   Pulmonary/Chest: Breath sounds normal. No respiratory distress. He has no wheezes. He has no rales.    CBC    Component Value Date/Time   WBC 7.6 05/05/2013 1116   RBC 4.82 05/05/2013 1116   HGB 13.5 05/05/2013 1116   HCT 37.8* 05/05/2013 1116   PLT 364 05/05/2013 1116   MCV 78.4 05/05/2013 1116   LYMPHSABS 2.9 05/05/2013 1116   MONOABS 0.7 05/05/2013 1116   EOSABS 0.4 05/05/2013 1116   BASOSABS 0.0 05/05/2013 1116    CMP     Component Value Date/Time   NA 134* 05/05/2013 1116   K 4.7 05/05/2013 1116   CL  101 05/05/2013 1116   CO2 24 05/05/2013 1116   GLUCOSE 157* 05/05/2013 1116   BUN 16 05/05/2013 1116   CREATININE 0.86 05/05/2013 1116   CREATININE 0.72 08/08/2012 1649   CALCIUM 9.8 05/05/2013 1116   PROT 7.1 05/05/2013 1116   ALBUMIN 4.7 05/05/2013 1116   AST 18 05/05/2013 1116   ALT 17 05/05/2013 1116   ALKPHOS 87 05/05/2013 1116   BILITOT 0.3 05/05/2013 1116   GFRNONAA >90 08/08/2012 1649   GFRAA >90 08/08/2012 1649    Lab Results  Component Value Date/Time   CHOL 161 05/05/2013 11:16 AM    No components found with this basename: hga1c    Lab Results  Component Value Date/Time   AST 18 05/05/2013 11:16 AM    Assessment and Plan  DM (diabetes mellitus) - Plan: Glucose (CBG), HgB A1c 9.0% uncontrolled, we'll continue with metformin 1 g twice a day I have increased, glipiZIDE (GLIPIZIDE XL) 10 MG 24 hr tablet twice a day, glucose blood test strip, advised to keep the fingerstick log  Unspecified essential hypertension Well controlled continue with losartan hydrochlorothiazide, low salt diet  Other and unspecified hyperlipidemia Pravachol 40 mg daily will check lipid profile on the next visit.  Cough - Plan: When necessary benzonatate (TESSALON) 100 MG capsule.   Return in about 3 months (around 10/16/2013).  Lorayne Marek, MD

## 2013-07-18 NOTE — Progress Notes (Signed)
Patient here for follow up on DM  Needs medication refills

## 2013-07-18 NOTE — Patient Instructions (Signed)

## 2013-08-04 ENCOUNTER — Other Ambulatory Visit: Payer: Self-pay | Admitting: Emergency Medicine

## 2013-08-04 MED ORDER — LOSARTAN POTASSIUM-HCTZ 50-12.5 MG PO TABS: 1.0000 | ORAL_TABLET | Freq: Every day | ORAL | Status: DC

## 2013-08-07 ENCOUNTER — Ambulatory Visit: Payer: Self-pay

## 2013-09-16 ENCOUNTER — Other Ambulatory Visit: Payer: Self-pay | Admitting: Pharmacist

## 2013-09-16 ENCOUNTER — Other Ambulatory Visit: Payer: Self-pay | Admitting: Emergency Medicine

## 2013-09-16 ENCOUNTER — Ambulatory Visit: Payer: 59

## 2013-09-16 DIAGNOSIS — E119 Type 2 diabetes mellitus without complications: Secondary | ICD-10-CM

## 2013-09-16 MED ORDER — TRUERESULT BLOOD GLUCOSE W/DEVICE KIT: 1.0000 | PACK | Status: DC

## 2013-09-16 MED ORDER — TRUEPLUS LANCETS 28G MISC: 1.0000 | Status: DC

## 2013-09-16 MED ORDER — PRAVASTATIN SODIUM 40 MG PO TABS: 40.0000 mg | ORAL_TABLET | Freq: Every day | ORAL | Status: DC

## 2013-09-16 MED ORDER — GLUCOSE BLOOD VI STRP: ORAL_STRIP | Status: DC

## 2013-09-25 ENCOUNTER — Ambulatory Visit: Payer: 59

## 2013-10-10 ENCOUNTER — Ambulatory Visit: Payer: 59

## 2013-10-14 ENCOUNTER — Ambulatory Visit: Payer: 59 | Attending: Internal Medicine

## 2013-10-16 ENCOUNTER — Ambulatory Visit: Payer: 59 | Attending: Internal Medicine | Admitting: Internal Medicine

## 2013-10-16 ENCOUNTER — Encounter: Payer: Self-pay | Admitting: Internal Medicine

## 2013-10-16 VITALS — BP 140/80 | HR 81 | Temp 98.6°F | Resp 15 | Wt 147.8 lb

## 2013-10-16 DIAGNOSIS — E785 Hyperlipidemia, unspecified: Secondary | ICD-10-CM | POA: Insufficient documentation

## 2013-10-16 DIAGNOSIS — I10 Essential (primary) hypertension: Secondary | ICD-10-CM | POA: Insufficient documentation

## 2013-10-16 DIAGNOSIS — E1110 Type 2 diabetes mellitus with ketoacidosis without coma: Secondary | ICD-10-CM

## 2013-10-16 DIAGNOSIS — E1310 Other specified diabetes mellitus with ketoacidosis without coma: Secondary | ICD-10-CM | POA: Insufficient documentation

## 2013-10-16 DIAGNOSIS — E119 Type 2 diabetes mellitus without complications: Secondary | ICD-10-CM

## 2013-10-16 LAB — GLUCOSE, POCT (MANUAL RESULT ENTRY): POC Glucose: 106 mg/dl — AB (ref 70–99)

## 2013-10-16 LAB — POCT GLYCOSYLATED HEMOGLOBIN (HGB A1C): Hemoglobin A1C: 8.8

## 2013-10-16 MED ORDER — SITAGLIPTIN PHOSPHATE 25 MG PO TABS: 25.0000 mg | ORAL_TABLET | Freq: Every day | ORAL | Status: DC

## 2013-10-16 MED ORDER — GLIPIZIDE ER 10 MG PO TB24: 10.0000 mg | ORAL_TABLET | Freq: Two times a day (BID) | ORAL | Status: DC

## 2013-10-16 MED ORDER — PRAVASTATIN SODIUM 40 MG PO TABS: 40.0000 mg | ORAL_TABLET | Freq: Every day | ORAL | Status: DC

## 2013-10-16 NOTE — Progress Notes (Signed)
Pt here for a follow up for diabetes Pt glucose 106 Pt A1C ran

## 2013-10-16 NOTE — Patient Instructions (Signed)

## 2013-10-16 NOTE — Progress Notes (Signed)
MRN: [id number] Name: Jerry Serrano  Sex: male Age: 63 y.o. DOB: 1950/08/02  Allergies: Review of patient's allergies indicates no known allergies.  No chief complaint on file.   HPI: Patient is 63 y.o. male who has history of diabetes hypertension hyperlipidemia comes today for followup, patient denies any hypoglycemic symptoms, has been taking metformin and glipizide as prescribed, initially his blood pressure was elevated, repeat manual blood pressure is 140/80, as per patient he has been taking his blood pressure medication but has not been compliant with low salt diet, patient denies any acute symptoms. Her  Past Medical History  Diagnosis Date  . Diabetes mellitus   . Hypertension     No past surgical history on file.    Medication List       This list is accurate as of: 10/16/13 10:48 AM.  Always use your most recent med list.               albuterol 108 (90 BASE) MCG/ACT inhaler  Commonly known as:  PROVENTIL HFA;VENTOLIN HFA  Inhale 2 puffs into the lungs every 4 (four) hours as needed for wheezing or shortness of breath (cough).     amLODipine 10 MG tablet  Commonly known as:  NORVASC  Take 1 tablet (10 mg total) by mouth daily.     aspirin EC 81 MG tablet  Take 1 tablet (81 mg total) by mouth daily.     benzonatate 100 MG capsule  Commonly known as:  TESSALON  Take 1 capsule (100 mg total) by mouth 3 (three) times daily as needed for cough.     glipiZIDE 10 MG 24 hr tablet  Commonly known as:  GLIPIZIDE XL  Take 1 tablet (10 mg total) by mouth 2 (two) times daily.     glucose blood test strip  Commonly known as:  TRUETEST TEST  Use as instructed     losartan-hydrochlorothiazide 50-12.5 MG per tablet  Commonly known as:  HYZAAR  Take 1 tablet by mouth daily.     metFORMIN 1000 MG tablet  Commonly known as:  GLUCOPHAGE  Take 1 tablet (1,000 mg total) by mouth 2 (two) times daily with a meal.     pravastatin 40 MG tablet  Commonly known  as:  PRAVACHOL  Take 1 tablet (40 mg total) by mouth daily.     sitaGLIPtin 25 MG tablet  Commonly known as:  JANUVIA  Take 1 tablet (25 mg total) by mouth daily.     TRUEPLUS LANCETS 28G Misc  1 Package by Does not apply route as directed.     TRUERESULT BLOOD GLUCOSE W/DEVICE Kit  1 Package by Does not apply route as directed.        Meds ordered this encounter  Medications  . glipiZIDE (GLIPIZIDE XL) 10 MG 24 hr tablet    Sig: Take 1 tablet (10 mg total) by mouth 2 (two) times daily.    Dispense:  60 tablet    Refill:  3  . pravastatin (PRAVACHOL) 40 MG tablet    Sig: Take 1 tablet (40 mg total) by mouth daily.    Dispense:  30 tablet    Refill:  3  . sitaGLIPtin (JANUVIA) 25 MG tablet    Sig: Take 1 tablet (25 mg total) by mouth daily.    Dispense:  30 tablet    Refill:  3    Immunization History  Administered Date(s) Administered  . Influenza Split 08/08/2012    No family  history on file.  History  Substance Use Topics  . Smoking status: Never Smoker   . Smokeless tobacco: Not on file  . Alcohol Use: No    Review of Systems   As noted in HPI  Filed Vitals:   10/16/13 1045  BP: 140/80  Pulse:   Temp:   Resp:     Physical Exam  Physical Exam  Constitutional: He is oriented to person, place, and time. No distress.  HENT:  Head: Normocephalic and atraumatic.  Eyes: EOM are normal. Pupils are equal, round, and reactive to light.  Cardiovascular: Normal rate and regular rhythm.   Pulmonary/Chest: Breath sounds normal. No respiratory distress. He has no wheezes. He has no rales.  Musculoskeletal: He exhibits no edema.  Neurological: He is alert and oriented to person, place, and time.    CBC    Component Value Date/Time   WBC 7.6 05/05/2013 1116   RBC 4.82 05/05/2013 1116   HGB 13.5 05/05/2013 1116   HCT 37.8* 05/05/2013 1116   PLT 364 05/05/2013 1116   MCV 78.4 05/05/2013 1116   LYMPHSABS 2.9 05/05/2013 1116   MONOABS 0.7 05/05/2013  1116   EOSABS 0.4 05/05/2013 1116   BASOSABS 0.0 05/05/2013 1116    CMP     Component Value Date/Time   NA 134* 05/05/2013 1116   K 4.7 05/05/2013 1116   CL 101 05/05/2013 1116   CO2 24 05/05/2013 1116   GLUCOSE 157* 05/05/2013 1116   BUN 16 05/05/2013 1116   CREATININE 0.86 05/05/2013 1116   CREATININE 0.72 08/08/2012 1649   CALCIUM 9.8 05/05/2013 1116   PROT 7.1 05/05/2013 1116   ALBUMIN 4.7 05/05/2013 1116   AST 18 05/05/2013 1116   ALT 17 05/05/2013 1116   ALKPHOS 87 05/05/2013 1116   BILITOT 0.3 05/05/2013 1116   GFRNONAA >90 08/08/2012 1649   GFRAA >90 08/08/2012 1649    Lab Results  Component Value Date/Time   CHOL 161 05/05/2013 11:16 AM    No components found with this basename: hga1c    Lab Results  Component Value Date/Time   AST 18 05/05/2013 11:16 AM    Assessment and Plan  DM (diabetes mellitus) type 2, uncontrolled,  Results for orders placed in visit on 10/16/13  POCT GLYCOSYLATED HEMOGLOBIN (HGB A1C)      Result Value Ref Range   Hemoglobin A1C 8.8    GLUCOSE, POCT (MANUAL RESULT ENTRY)      Result Value Ref Range   POC Glucose 106 (*) 70 - 99 mg/dl   Still uncontrolled, patient will continue with metformin and glipizide, I have added Januvia, advise patient to keep the fingerstick log   Hypertension- Plan: Manual blood pressure is 140/80, advise patient for DASH diet, continue with losartan/hydrochlorothiazide.   Other and unspecified hyperlipidemia Patient will continue with pravastatin (PRAVACHOL) 40 MG tablet, will repeat fasting lipid panel on next visit    Return in about 3 months (around 01/15/2014) for diabetes, hypertension, hyperipidemia.  Lorayne Marek, MD

## 2013-10-17 ENCOUNTER — Telehealth: Payer: Self-pay

## 2013-10-17 NOTE — Telephone Encounter (Signed)
Spoke with [employer] for prior authorization for his Balinda [employer] Max approved the medication Authorization number #[id number] Expires 10/18/2018

## 2014-02-23 ENCOUNTER — Other Ambulatory Visit: Payer: Self-pay | Admitting: Internal Medicine

## 2014-02-23 DIAGNOSIS — E119 Type 2 diabetes mellitus without complications: Secondary | ICD-10-CM

## 2014-04-20 ENCOUNTER — Telehealth: Payer: Self-pay | Admitting: Internal Medicine

## 2014-04-20 NOTE — Telephone Encounter (Signed)
Pt is requesting a refill on metFORMIN (GLUCOPHAGE) 1000 MG tablet. Pt ran out about 2 weeks ago. Please follow up with pt. Prefers the medication to be sent to the [facility] on [address].

## 2014-04-23 ENCOUNTER — Other Ambulatory Visit: Payer: Self-pay | Admitting: Emergency Medicine

## 2014-04-23 MED ORDER — METFORMIN HCL 1000 MG PO TABS: 1000.0000 mg | ORAL_TABLET | Freq: Two times a day (BID) | ORAL | Status: DC

## 2014-05-01 ENCOUNTER — Other Ambulatory Visit: Payer: Self-pay

## 2014-05-01 MED ORDER — AMLODIPINE BESYLATE 10 MG PO TABS: 10.0000 mg | ORAL_TABLET | Freq: Every day | ORAL | Status: DC

## 2014-05-29 ENCOUNTER — Telehealth: Payer: Self-pay | Admitting: Internal Medicine

## 2014-05-29 ENCOUNTER — Other Ambulatory Visit: Payer: Self-pay | Admitting: Internal Medicine

## 2014-05-29 NOTE — Telephone Encounter (Signed)
Patient has come in today to request a medication refill for Betamethasone Dipropionate Ointment USP (Augmented*) 0.05%; Patient states this is the only cream that works for his hands and is only available at [facility]; please f/u with this request;

## 2014-06-03 NOTE — Telephone Encounter (Signed)
Expand All Collapse All   Patient has come in today to request a medication refill for Betamethasone Dipropionate Ointment USP (Augmented*) 0.05%; Patient states this is the only cream that works for his hands and is only available at [facility]; please f/u with this request;

## 2014-06-09 ENCOUNTER — Ambulatory Visit: Payer: 59 | Attending: Internal Medicine | Admitting: Internal Medicine

## 2014-06-09 ENCOUNTER — Encounter: Payer: Self-pay | Admitting: Internal Medicine

## 2014-06-09 VITALS — BP 120/75 | HR 92 | Temp 97.0°F | Resp 16 | Wt 152.2 lb

## 2014-06-09 DIAGNOSIS — E785 Hyperlipidemia, unspecified: Secondary | ICD-10-CM | POA: Insufficient documentation

## 2014-06-09 DIAGNOSIS — L309 Dermatitis, unspecified: Secondary | ICD-10-CM | POA: Insufficient documentation

## 2014-06-09 DIAGNOSIS — E139 Other specified diabetes mellitus without complications: Secondary | ICD-10-CM

## 2014-06-09 DIAGNOSIS — E1165 Type 2 diabetes mellitus with hyperglycemia: Secondary | ICD-10-CM | POA: Insufficient documentation

## 2014-06-09 DIAGNOSIS — I10 Essential (primary) hypertension: Secondary | ICD-10-CM | POA: Diagnosis not present

## 2014-06-09 DIAGNOSIS — Z7982 Long term (current) use of aspirin: Secondary | ICD-10-CM | POA: Diagnosis not present

## 2014-06-09 DIAGNOSIS — Z1211 Encounter for screening for malignant neoplasm of colon: Secondary | ICD-10-CM

## 2014-06-09 LAB — POCT GLYCOSYLATED HEMOGLOBIN (HGB A1C): Hemoglobin A1C: 8.7

## 2014-06-09 LAB — GLUCOSE, POCT (MANUAL RESULT ENTRY): POC Glucose: 320 mg/dl — AB (ref 70–99)

## 2014-06-09 MED ORDER — BETAMETHASONE DIPROPIONATE 0.05 % EX OINT: TOPICAL_OINTMENT | Freq: Two times a day (BID) | CUTANEOUS | Status: DC

## 2014-06-09 MED ORDER — SITAGLIPTIN PHOSPHATE 50 MG PO TABS: 50.0000 mg | ORAL_TABLET | Freq: Every day | ORAL | Status: DC

## 2014-06-09 NOTE — Progress Notes (Signed)
MRN: [id number] Name: Jerry Serrano  Sex: male Age: 63 y.o. DOB: [birth year]  Allergies: Review of patient's allergies indicates no known allergies.  Chief Complaint  Patient presents with  . Follow-up    HPI: Patient is 63 y.o. male who has to of diabetes hypertension and hyperlipidemia, dermatitis comes today for followup, patient is requesting refill for dermatitis cream, for diabetes patient has been taking metformin, glipizide as well as Januvia 25 mg, patient denies any hypoglycemic symptoms, his hemoglobin A1c has not much improved compared to last visit. His current hemoglobin A1c is 8.7% still uncontrolled, as per patient he recently got the flu shot at the pharmacy. Past Medical History  Diagnosis Date  . Diabetes mellitus   . Hypertension     History reviewed. No pertinent past surgical history.    Medication List       This list is accurate as of: 06/09/14  3:25 PM.  Always use your most recent med list.               albuterol 108 (90 BASE) MCG/ACT inhaler  Commonly known as:  PROVENTIL HFA;VENTOLIN HFA  Inhale 2 puffs into the lungs every 4 (four) hours as needed for wheezing or shortness of breath (cough).     amLODipine 10 MG tablet  Commonly known as:  NORVASC  Take 1 tablet (10 mg total) by mouth daily.     aspirin EC 81 MG tablet  Take 1 tablet (81 mg total) by mouth daily.     benzonatate 100 MG capsule  Commonly known as:  TESSALON  Take 1 capsule (100 mg total) by mouth 3 (three) times daily as needed for cough.     betamethasone dipropionate 0.05 % ointment  Commonly known as:  DIPROLENE  Apply topically 2 (two) times daily.     glipiZIDE 10 MG 24 hr tablet  Commonly known as:  GLIPIZIDE XL  Take 1 tablet (10 mg total) by mouth 2 (two) times daily.     glucose blood test strip  Commonly known as:  TRUETEST TEST  Use as instructed     losartan-hydrochlorothiazide 50-12.5 MG per tablet  Commonly known as:  HYZAAR  Take 1 tablet  by mouth daily.     metFORMIN 1000 MG tablet  Commonly known as:  GLUCOPHAGE  Take 1 tablet (1,000 mg total) by mouth 2 (two) times daily with a meal.     pravastatin 40 MG tablet  Commonly known as:  PRAVACHOL  Take 1 tablet (40 mg total) by mouth daily.     sitaGLIPtin 50 MG tablet  Commonly known as:  JANUVIA  Take 1 tablet (50 mg total) by mouth daily.     TRUEPLUS LANCETS 28G Misc  1 Package by Does not apply route as directed.     TRUERESULT BLOOD GLUCOSE W/DEVICE Kit  1 Package by Does not apply route as directed.        Meds ordered this encounter  Medications  . sitaGLIPtin (JANUVIA) 50 MG tablet    Sig: Take 1 tablet (50 mg total) by mouth daily.    Dispense:  30 tablet    Refill:  3    FUTURE REFILLS REQUIRE AN APPOINTMENT  . betamethasone dipropionate (DIPROLENE) 0.05 % ointment    Sig: Apply topically 2 (two) times daily.    Dispense:  30 g    Refill:  0    Immunization History  Administered Date(s) Administered  . Influenza [place] 08/08/2012  History reviewed. No pertinent family history.  History  Substance Use Topics  . Smoking status: Never Smoker   . Smokeless tobacco: Not on file  . Alcohol Use: No    Review of Systems   As noted in HPI  Filed Vitals:   06/09/14 1459  BP: 120/75  Pulse: 92  Temp: 97 F (36.1 C)  Resp: 16    Physical Exam  Physical Exam  Constitutional: No distress.  Eyes: EOM are normal. Pupils are equal, round, and reactive to light.  Cardiovascular: Normal rate and regular rhythm.   Pulmonary/Chest: Breath sounds normal. No respiratory distress. He has no wheezes. He has no rales.  Musculoskeletal: He exhibits no edema.    CBC    Component Value Date/Time   WBC 7.6 05/05/2013 1116   RBC 4.82 05/05/2013 1116   HGB 13.5 05/05/2013 1116   HCT 37.8* 05/05/2013 1116   PLT 364 05/05/2013 1116   MCV 78.4 05/05/2013 1116   LYMPHSABS 2.9 05/05/2013 1116   MONOABS 0.7 05/05/2013 1116   EOSABS 0.4  05/05/2013 1116   BASOSABS 0.0 05/05/2013 1116    CMP     Component Value Date/Time   NA 134* 05/05/2013 1116   K 4.7 05/05/2013 1116   CL 101 05/05/2013 1116   CO2 24 05/05/2013 1116   GLUCOSE 157* 05/05/2013 1116   BUN 16 05/05/2013 1116   CREATININE 0.86 05/05/2013 1116   CREATININE 0.72 08/08/2012 1649   CALCIUM 9.8 05/05/2013 1116   PROT 7.1 05/05/2013 1116   ALBUMIN 4.7 05/05/2013 1116   AST 18 05/05/2013 1116   ALT 17 05/05/2013 1116   ALKPHOS 87 05/05/2013 1116   BILITOT 0.3 05/05/2013 1116   GFRNONAA >90 08/08/2012 1649   GFRAA >90 08/08/2012 1649    Lab Results  Component Value Date/Time   CHOL 161 05/05/2013 11:16 AM    No components found for: HGA1C  Lab Results  Component Value Date/Time   AST 18 05/05/2013 11:16 AM    Assessment and Plan  Other specified diabetes mellitus without complications - Plan:  Results for orders placed or performed in visit on 06/09/14  Glucose (CBG)  Result Value Ref Range   POC Glucose 320 (A) 70 - 99 mg/dl  HgB A1c  Result Value Ref Range   Hemoglobin A1C 8.7    Diabetes is still uncontrolled, have advised patient for diabetes meal planning, he'll continue with metformin, glipizide, I have increased the dose of Januvia to to 50 mg daily, will repeat  A1c in 3 months. sitaGLIPtin (JANUVIA) 50 MG tablet  Special screening for malignant neoplasms, colon - Plan: Ambulatory referral to Gastroenterology  Essential hypertension - Plan: advised patient for DASH diet, continue with current meds, blood pressure is well controlled, will check blood chemistry COMPLETE METABOLIC PANEL WITH GFR  Hyperlipidemia - Plan: currently patient is on Pravachol 40 mg daily, will get fasting Lipid panel  Dermatitis - Plan: betamethasone dipropionate (DIPROLENE) 0.05 % ointment   Health Maintenance -Colonoscopy: referred to GI   -Vaccinations:  As per patient he got flu shot at the pharmacy    Return in about 3 months (around  09/08/2014) for diabetes, hypertension, hyperipidemia.  Lorayne Marek, MD

## 2014-06-09 NOTE — Patient Instructions (Signed)
Diabetes Mellitus and Food It is important for you to manage your blood sugar (glucose) level. Your blood glucose level can be greatly affected by what you eat. Eating healthier foods in the appropriate amounts throughout the day at about the same time each day will help you control your blood glucose level. It can also help slow or prevent worsening of your diabetes mellitus. Healthy eating may even help you improve the level of your blood pressure and reach or maintain a healthy weight.  HOW CAN FOOD AFFECT ME? Carbohydrates Carbohydrates affect your blood glucose level more than any other type of food. Your dietitian will help you determine how many carbohydrates to eat at each meal and teach you how to count carbohydrates. Counting carbohydrates is important to keep your blood glucose at a healthy level, especially if you are using insulin or taking certain medicines for diabetes mellitus. Alcohol Alcohol can cause sudden decreases in blood glucose (hypoglycemia), especially if you use insulin or take certain medicines for diabetes mellitus. Hypoglycemia can be a life-threatening condition. Symptoms of hypoglycemia (sleepiness, dizziness, and disorientation) are similar to symptoms of having too much alcohol.  If your health care provider has given you approval to drink alcohol, do so in moderation and use the following guidelines:  Women should not have more than one drink per day, and men should not have more than two drinks per day. One drink is equal to:  12 oz of beer.  5 oz of wine.  1 oz of hard liquor.  Do not drink on an empty stomach.  Keep yourself hydrated. Have water, diet soda, or unsweetened iced tea.  Regular soda, juice, and other mixers might contain a lot of carbohydrates and should be counted. WHAT FOODS ARE NOT RECOMMENDED? As you make food choices, it is important to remember that all foods are not the same. Some foods have fewer nutrients per serving than other  foods, even though they might have the same number of calories or carbohydrates. It is difficult to get your body what it needs when you eat foods with fewer nutrients. Examples of foods that you should avoid that are high in calories and carbohydrates but low in nutrients include:  Trans fats (most processed foods list trans fats on the Nutrition Facts label).  Regular soda.  Juice.  Candy.  Sweets, such as cake, pie, doughnuts, and cookies.  Fried foods. WHAT FOODS CAN I EAT? Have nutrient-rich foods, which will nourish your body and keep you healthy. The food you should eat also will depend on several factors, including:  The calories you need.  The medicines you take.  Your weight.  Your blood glucose level.  Your blood pressure level.  Your cholesterol level. You also should eat a variety of foods, including:  Protein, such as meat, poultry, fish, tofu, nuts, and seeds (lean animal proteins are best).  Fruits.  Vegetables.  Dairy products, such as milk, cheese, and yogurt (low fat is best).  Breads, grains, pasta, cereal, rice, and beans.  Fats such as olive oil, trans fat-free margarine, canola oil, avocado, and olives. DOES EVERYONE WITH DIABETES MELLITUS HAVE THE SAME MEAL PLAN? Because every person with diabetes mellitus is different, there is not one meal plan that works for everyone. It is very important that you meet with a dietitian who will help you create a meal plan that is just right for you. Document Released: 03/23/2005 Document Revised: 07/01/2013 Document Reviewed: 05/23/2013 Exit[employer] Patient Information 2015 [employer]. This   information is not intended to replace advice given to you by your health care provider. Make sure you discuss any questions you have with your health care provider.  

## 2014-06-09 NOTE — Progress Notes (Signed)
Patient here for follow up on his DM and HTn Needs refill on his [place]  And betamethasone cream

## 2014-07-07 ENCOUNTER — Other Ambulatory Visit: Payer: Self-pay | Admitting: Internal Medicine

## 2014-07-22 ENCOUNTER — Other Ambulatory Visit: Payer: Self-pay | Admitting: Internal Medicine

## 2014-07-22 NOTE — Telephone Encounter (Signed)
Left voice message, need OV for refills

## 2014-07-22 NOTE — Telephone Encounter (Signed)
Patient has come in today to request a medication refill for betamethasone dipropionate (DIPROLENE) 0.05 % ointment ; Please send request to [facility] on [address] because this is the only place that has his exact brand; please f/u with patient

## 2014-07-28 ENCOUNTER — Other Ambulatory Visit: Payer: Self-pay

## 2014-07-28 DIAGNOSIS — L309 Dermatitis, unspecified: Secondary | ICD-10-CM

## 2014-07-28 MED ORDER — BETAMETHASONE DIPROPIONATE 0.05 % EX OINT: TOPICAL_OINTMENT | Freq: Two times a day (BID) | CUTANEOUS | Status: DC

## 2014-08-10 ENCOUNTER — Other Ambulatory Visit: Payer: Self-pay | Admitting: Internal Medicine

## 2014-08-13 ENCOUNTER — Telehealth: Payer: Self-pay | Admitting: Internal Medicine

## 2014-08-13 MED ORDER — LOSARTAN POTASSIUM-HCTZ 50-12.5 MG PO TABS: 1.0000 | ORAL_TABLET | Freq: Every day | ORAL | Status: DC

## 2014-08-13 MED ORDER — AMLODIPINE BESYLATE 10 MG PO TABS: 10.0000 mg | ORAL_TABLET | Freq: Every day | ORAL | Status: DC

## 2014-08-13 NOTE — Telephone Encounter (Signed)
Patient needs refill on the following medications and would like them to be sent to [facility], [place] - [address]   amLODipine (NORVASC) 10 MG tablet [[id number]],YOFVWAQL-RJPVGKKDPTELMRAJHHI (HYZAAR) 50-12.5 MG per tablet [[id number]], glipiZIDE (GLIPIZIDE XL) 10 MG 24 hr tablet [[id number]]. One of the medications on his chart it states it was sent but he states they did not receive the medication. Please follow up with patient

## 2014-08-13 NOTE — Telephone Encounter (Signed)
Medications refilled and sent to the pharmacy on file

## 2014-08-14 ENCOUNTER — Other Ambulatory Visit: Payer: Self-pay | Admitting: Internal Medicine

## 2014-08-14 ENCOUNTER — Telehealth: Payer: Self-pay | Admitting: Internal Medicine

## 2014-08-14 ENCOUNTER — Other Ambulatory Visit: Payer: Self-pay

## 2014-08-14 NOTE — Telephone Encounter (Signed)
[facility] pharm is calling to request a refill of [place] for pt.

## 2014-08-15 ENCOUNTER — Other Ambulatory Visit: Payer: Self-pay | Admitting: Internal Medicine

## 2014-08-18 ENCOUNTER — Other Ambulatory Visit: Payer: Self-pay | Admitting: *Deleted

## 2014-08-18 DIAGNOSIS — E119 Type 2 diabetes mellitus without complications: Secondary | ICD-10-CM

## 2014-08-18 MED ORDER — GLIPIZIDE ER 10 MG PO TB24: 10.0000 mg | ORAL_TABLET | Freq: Two times a day (BID) | ORAL | Status: DC

## 2014-09-08 ENCOUNTER — Other Ambulatory Visit: Payer: Self-pay | Admitting: *Deleted

## 2014-09-08 DIAGNOSIS — E119 Type 2 diabetes mellitus without complications: Secondary | ICD-10-CM

## 2014-09-08 MED ORDER — GLIPIZIDE ER 10 MG PO TB24: 10.0000 mg | ORAL_TABLET | Freq: Two times a day (BID) | ORAL | Status: DC

## 2014-09-09 ENCOUNTER — Other Ambulatory Visit: Payer: Self-pay

## 2014-09-09 DIAGNOSIS — E1110 Type 2 diabetes mellitus with ketoacidosis without coma: Secondary | ICD-10-CM

## 2014-09-09 MED ORDER — PRAVASTATIN SODIUM 40 MG PO TABS: 40.0000 mg | ORAL_TABLET | Freq: Every day | ORAL | Status: DC

## 2014-10-13 ENCOUNTER — Other Ambulatory Visit: Payer: Self-pay | Admitting: Internal Medicine

## 2014-10-19 ENCOUNTER — Telehealth: Payer: Self-pay | Admitting: Internal Medicine

## 2014-10-19 NOTE — Telephone Encounter (Signed)
Patient has come in today to see if he can receive a medication refill for betamethasone dipropionate (DIPROLENE) 0.05 % ointment ; please f/u with patient

## 2014-10-27 ENCOUNTER — Other Ambulatory Visit: Payer: Self-pay

## 2014-10-27 DIAGNOSIS — L309 Dermatitis, unspecified: Secondary | ICD-10-CM

## 2014-10-27 MED ORDER — BETAMETHASONE DIPROPIONATE 0.05 % EX OINT: TOPICAL_OINTMENT | Freq: Two times a day (BID) | CUTANEOUS | Status: DC

## 2014-10-27 NOTE — Telephone Encounter (Signed)
Patient presented to request a med refill for  betamethasone dipropionate (DIPROLENE) 0.05 % ointment. Please f/u with pt.

## 2014-10-27 NOTE — Progress Notes (Unsigned)
Patient came into office requesting a refill on betamethasone cream [facility]

## 2014-10-29 ENCOUNTER — Other Ambulatory Visit: Payer: Self-pay | Admitting: Internal Medicine

## 2014-11-02 ENCOUNTER — Other Ambulatory Visit: Payer: Self-pay

## 2014-11-02 ENCOUNTER — Ambulatory Visit: Payer: 59 | Attending: Internal Medicine

## 2014-11-02 MED ORDER — LOSARTAN POTASSIUM-HCTZ 50-12.5 MG PO TABS: 1.0000 | ORAL_TABLET | Freq: Every day | ORAL | Status: DC

## 2014-11-02 MED ORDER — AMLODIPINE BESYLATE 10 MG PO TABS: 10.0000 mg | ORAL_TABLET | Freq: Every day | ORAL | Status: DC

## 2014-11-27 ENCOUNTER — Ambulatory Visit: Payer: 59 | Attending: Internal Medicine

## 2015-02-03 ENCOUNTER — Encounter: Payer: Self-pay | Admitting: Internal Medicine

## 2015-02-03 ENCOUNTER — Ambulatory Visit: Payer: 59 | Attending: Internal Medicine | Admitting: Internal Medicine

## 2015-02-03 VITALS — BP 123/84 | HR 82 | Temp 98.0°F | Resp 16 | Wt 145.4 lb

## 2015-02-03 DIAGNOSIS — E139 Other specified diabetes mellitus without complications: Secondary | ICD-10-CM

## 2015-02-03 DIAGNOSIS — E119 Type 2 diabetes mellitus without complications: Secondary | ICD-10-CM | POA: Insufficient documentation

## 2015-02-03 DIAGNOSIS — I10 Essential (primary) hypertension: Secondary | ICD-10-CM | POA: Insufficient documentation

## 2015-02-03 DIAGNOSIS — Z79899 Other long term (current) drug therapy: Secondary | ICD-10-CM | POA: Diagnosis not present

## 2015-02-03 DIAGNOSIS — E785 Hyperlipidemia, unspecified: Secondary | ICD-10-CM | POA: Insufficient documentation

## 2015-02-03 LAB — GLUCOSE, POCT (MANUAL RESULT ENTRY): POC Glucose: 134 mg/dl — AB (ref 70–99)

## 2015-02-03 LAB — POCT GLYCOSYLATED HEMOGLOBIN (HGB A1C): Hemoglobin A1C: 9.4

## 2015-02-03 MED ORDER — SITAGLIPTIN PHOSPHATE 100 MG PO TABS: 50.0000 mg | ORAL_TABLET | Freq: Every day | ORAL | Status: DC

## 2015-02-03 MED ORDER — METFORMIN HCL 1000 MG PO TABS: 1000.0000 mg | ORAL_TABLET | Freq: Two times a day (BID) | ORAL | Status: DC

## 2015-02-03 MED ORDER — PRAVASTATIN SODIUM 40 MG PO TABS: 40.0000 mg | ORAL_TABLET | Freq: Every day | ORAL | Status: DC

## 2015-02-03 MED ORDER — LOSARTAN POTASSIUM-HCTZ 50-12.5 MG PO TABS: 1.0000 | ORAL_TABLET | Freq: Every day | ORAL | Status: DC

## 2015-02-03 MED ORDER — AMLODIPINE BESYLATE 10 MG PO TABS: 10.0000 mg | ORAL_TABLET | Freq: Every day | ORAL | Status: DC

## 2015-02-03 MED ORDER — GLIPIZIDE ER 10 MG PO TB24: 10.0000 mg | ORAL_TABLET | Freq: Two times a day (BID) | ORAL | Status: DC

## 2015-02-03 NOTE — Progress Notes (Signed)
Patient here for follow up on his diabetes and for medications refills

## 2015-02-03 NOTE — Patient Instructions (Signed)
Fat and Cholesterol Control Diet Fat and cholesterol levels in your blood and organs are influenced by your diet. High levels of fat and cholesterol may lead to diseases of the heart, small and large blood vessels, gallbladder, liver, and pancreas. CONTROLLING FAT AND CHOLESTEROL WITH DIET Although exercise and lifestyle factors are important, your diet is key. That is because certain foods are known to raise cholesterol and others to lower it. The goal is to balance foods for their effect on cholesterol and more importantly, to replace saturated and trans fat with other types of fat, such as monounsaturated fat, polyunsaturated fat, and omega-3 fatty acids. On average, a person should consume no more than 15 to 17 g of saturated fat daily. Saturated and trans fats are considered "bad" fats, and they will raise LDL cholesterol. Saturated fats are primarily found in animal products such as meats, butter, and cream. However, that does not mean you need to give up all your favorite foods. Today, there are good tasting, low-fat, low-cholesterol substitutes for most of the things you like to eat. Choose low-fat or nonfat alternatives. Choose round or loin cuts of red meat. These types of cuts are lowest in fat and cholesterol. Chicken (without the skin), fish, veal, and ground turkey breast are great choices. Eliminate fatty meats, such as hot dogs and salami. Even shellfish have little or no saturated fat. Have a 3 oz (85 g) portion when you eat lean meat, poultry, or fish. Trans fats are also called "partially hydrogenated oils." They are oils that have been scientifically manipulated so that they are solid at room temperature resulting in a longer shelf life and improved taste and texture of foods in which they are added. Trans fats are found in stick margarine, some tub margarines, cookies, crackers, and baked goods.  When baking and cooking, oils are a great substitute for butter. The monounsaturated oils are  especially beneficial since it is believed they lower LDL and raise HDL. The oils you should avoid entirely are saturated tropical oils, such as coconut and palm.  Remember to eat a lot from food groups that are naturally free of saturated and trans fat, including fish, fruit, vegetables, beans, grains (barley, rice, couscous, bulgur wheat), and pasta (without cream sauces).  IDENTIFYING FOODS THAT LOWER FAT AND CHOLESTEROL  Soluble fiber may lower your cholesterol. This type of fiber is found in fruits such as apples, vegetables such as broccoli, potatoes, and carrots, legumes such as beans, peas, and lentils, and grains such as barley. Foods fortified with plant sterols (phytosterol) may also lower cholesterol. You should eat at least 2 g per day of these foods for a cholesterol lowering effect.  Read package labels to identify low-saturated fats, trans fat free, and low-fat foods at the supermarket. Select cheeses that have only 2 to 3 g saturated fat per ounce. Use a heart-healthy tub margarine that is free of trans fats or partially hydrogenated oil. When buying baked goods (cookies, crackers), avoid partially hydrogenated oils. Breads and muffins should be made from whole grains (whole-wheat or whole oat flour, instead of "flour" or "enriched flour"). Buy non-creamy canned soups with reduced salt and no added fats.  FOOD PREPARATION TECHNIQUES  Never deep-fry. If you must fry, either stir-fry, which uses very little fat, or use non-stick cooking sprays. When possible, broil, bake, or roast meats, and steam vegetables. Instead of putting butter or margarine on vegetables, use lemon and herbs, applesauce, and cinnamon (for squash and sweet potatoes). Use nonfat   yogurt, salsa, and low-fat dressings for salads.  LOW-SATURATED FAT / LOW-FAT FOOD SUBSTITUTES Meats / Saturated Fat (g)  Avoid: Steak, marbled (3 oz/85 g) / 11 g  Choose: Steak, lean (3 oz/85 g) / 4 g  Avoid: Hamburger (3 oz/85 g) / 7  g  Choose: Hamburger, lean (3 oz/85 g) / 5 g  Avoid: Ham (3 oz/85 g) / 6 g  Choose: Ham, lean cut (3 oz/85 g) / 2.4 g  Avoid: Chicken, with skin, dark meat (3 oz/85 g) / 4 g  Choose: Chicken, skin removed, dark meat (3 oz/85 g) / 2 g  Avoid: Chicken, with skin, light meat (3 oz/85 g) / 2.5 g  Choose: Chicken, skin removed, light meat (3 oz/85 g) / 1 g Dairy / Saturated Fat (g)  Avoid: Whole milk (1 cup) / 5 g  Choose: Low-fat milk, 2% (1 cup) / 3 g  Choose: Low-fat milk, 1% (1 cup) / 1.5 g  Choose: Skim milk (1 cup) / 0.3 g  Avoid: Hard cheese (1 oz/28 g) / 6 g  Choose: Skim milk cheese (1 oz/28 g) / 2 to 3 g  Avoid: Cottage cheese, 4% fat (1 cup) / 6.5 g  Choose: Low-fat cottage cheese, 1% fat (1 cup) / 1.5 g  Avoid: Ice cream (1 cup) / 9 g  Choose: Sherbet (1 cup) / 2.5 g  Choose: Nonfat frozen yogurt (1 cup) / 0.3 g  Choose: Frozen fruit bar / trace  Avoid: Whipped cream (1 tbs) / 3.5 g  Choose: Nondairy whipped topping (1 tbs) / 1 g Condiments / Saturated Fat (g)  Avoid: Mayonnaise (1 tbs) / 2 g  Choose: Low-fat mayonnaise (1 tbs) / 1 g  Avoid: Butter (1 tbs) / 7 g  Choose: Extra light margarine (1 tbs) / 1 g  Avoid: Coconut oil (1 tbs) / 11.8 g  Choose: Olive oil (1 tbs) / 1.8 g  Choose: Corn oil (1 tbs) / 1.7 g  Choose: Safflower oil (1 tbs) / 1.2 g  Choose: Sunflower oil (1 tbs) / 1.4 g  Choose: Soybean oil (1 tbs) / 2.4 g  Choose: Canola oil (1 tbs) / 1 g Document Released: 06/26/2005 Document Revised: 10/21/2012 Document Reviewed: 09/24/2013 ExitCare Patient Information 2015 [place], [place]. This information is not intended to replace advice given to you by your health care provider. Make sure you discuss any questions you have with your health care provider. Diabetes Mellitus and Food It is important for you to manage your blood sugar (glucose) level. Your blood glucose level can be greatly affected by what you eat. Eating healthier  foods in the appropriate amounts throughout the day at about the same time each day will help you control your blood glucose level. It can also help slow or prevent worsening of your diabetes mellitus. Healthy eating may even help you improve the level of your blood pressure and reach or maintain a healthy weight.  HOW CAN FOOD AFFECT ME? Carbohydrates Carbohydrates affect your blood glucose level more than any other type of food. Your dietitian will help you determine how many carbohydrates to eat at each meal and teach you how to count carbohydrates. Counting carbohydrates is important to keep your blood glucose at a healthy level, especially if you are using insulin or taking certain medicines for diabetes mellitus. Alcohol Alcohol can cause sudden decreases in blood glucose (hypoglycemia), especially if you use insulin or take certain medicines for diabetes mellitus. Hypoglycemia can be a life-threatening  condition. Symptoms of hypoglycemia (sleepiness, dizziness, and disorientation) are similar to symptoms of having too much alcohol.  If your health care provider has given you approval to drink alcohol, do so in moderation and use the following guidelines:  Women should not have more than one drink per day, and men should not have more than two drinks per day. One drink is equal to:  12 oz of beer.  5 oz of wine.  1 oz of hard liquor.  Do not drink on an empty stomach.  Keep yourself hydrated. Have water, diet soda, or unsweetened iced tea.  Regular soda, juice, and other mixers might contain a lot of carbohydrates and should be counted. WHAT FOODS ARE NOT RECOMMENDED? As you make food choices, it is important to remember that all foods are not the same. Some foods have fewer nutrients per serving than other foods, even though they might have the same number of calories or carbohydrates. It is difficult to get your body what it needs when you eat foods with fewer nutrients. Examples of  foods that you should avoid that are high in calories and carbohydrates but low in nutrients include:  Trans fats (most processed foods list trans fats on the Nutrition Facts label).  Regular soda.  Juice.  Candy.  Sweets, such as cake, pie, doughnuts, and cookies.  Fried foods. WHAT FOODS CAN I EAT? Have nutrient-rich foods, which will nourish your body and keep you healthy. The food you should eat also will depend on several factors, including:  The calories you need.  The medicines you take.  Your weight.  Your blood glucose level.  Your blood pressure level.  Your cholesterol level. You also should eat a variety of foods, including:  Protein, such as meat, poultry, fish, tofu, nuts, and seeds (lean animal proteins are best).  Fruits.  Vegetables.  Dairy products, such as milk, cheese, and yogurt (low fat is best).  Breads, grains, pasta, cereal, rice, and beans.  Fats such as olive oil, trans fat-free margarine, canola oil, avocado, and olives. DOES EVERYONE WITH DIABETES MELLITUS HAVE THE SAME MEAL PLAN? Because every person with diabetes mellitus is different, there is not one meal plan that works for everyone. It is very important that you meet with a dietitian who will help you create a meal plan that is just right for you. Document Released: 03/23/2005 Document Revised: 07/01/2013 Document Reviewed: 05/23/2013 ExitCare Patient Information 2015 [employer]. This information is not intended to replace advice given to you by your health care provider. Make sure you discuss any questions you have with your health care provider.  

## 2015-02-03 NOTE — Progress Notes (Signed)
MRN: [id number] Name: Jerry Serrano  Sex: male Age: 64 y.o. DOB: 1950/11/04  Allergies: Review of patient's allergies indicates no known allergies.  Chief Complaint  Patient presents with  . Follow-up    HPI: Patient is 64 y.o. male who history of hypertension diabetes, hyperlipidemia comes today for followup, he denies any hypoglycemic symptoms, currently taking metformin glipizide and Januvia, today noticed his hemoglobin A1c has trended up, as per patient is compliant in taking his medications, currently denies any headache dizziness chest and shortness of breath.  Past Medical History  Diagnosis Date  . Diabetes mellitus   . Hypertension     History reviewed. No pertinent past surgical history.    Medication List       This list is accurate as of: 02/03/15 12:24 PM.  Always use your most recent med list.               albuterol 108 (90 BASE) MCG/ACT inhaler  Commonly known as:  PROVENTIL HFA;VENTOLIN HFA  Inhale 2 puffs into the lungs every 4 (four) hours as needed for wheezing or shortness of breath (cough).     amLODipine 10 MG tablet  Commonly known as:  NORVASC  Take 1 tablet (10 mg total) by mouth daily.     aspirin EC 81 MG tablet  Take 1 tablet (81 mg total) by mouth daily.     benzonatate 100 MG capsule  Commonly known as:  TESSALON  Take 1 capsule (100 mg total) by mouth 3 (three) times daily as needed for cough.     betamethasone dipropionate 0.05 % ointment  Commonly known as:  DIPROLENE  Apply topically 2 (two) times daily.     glipiZIDE 10 MG 24 hr tablet  Commonly known as:  GLIPIZIDE XL  Take 1 tablet (10 mg total) by mouth 2 (two) times daily.     glucose blood test strip  Commonly known as:  TRUETEST TEST  Use as instructed     losartan-hydrochlorothiazide 50-12.5 MG per tablet  Commonly known as:  HYZAAR  Take 1 tablet by mouth daily.     metFORMIN 1000 MG tablet  Commonly known as:  GLUCOPHAGE  Take 1 tablet (1,000 mg  total) by mouth 2 (two) times daily with a meal.     pravastatin 40 MG tablet  Commonly known as:  PRAVACHOL  Take 1 tablet (40 mg total) by mouth daily.     sitaGLIPtin 100 MG tablet  Commonly known as:  JANUVIA  Take 0.5 tablets (50 mg total) by mouth daily.     TRUEPLUS LANCETS 28G Misc  1 Package by Does not apply route as directed.     TRUERESULT BLOOD GLUCOSE W/DEVICE Kit  1 Package by Does not apply route as directed.        Meds ordered this encounter  Medications  . amLODipine (NORVASC) 10 MG tablet    Sig: Take 1 tablet (10 mg total) by mouth daily.    Dispense:  30 tablet    Refill:  4  . glipiZIDE (GLIPIZIDE XL) 10 MG 24 hr tablet    Sig: Take 1 tablet (10 mg total) by mouth 2 (two) times daily.    Dispense:  60 tablet    Refill:  4  . sitaGLIPtin (JANUVIA) 100 MG tablet    Sig: Take 0.5 tablets (50 mg total) by mouth daily.    Dispense:  30 tablet    Refill:  4  . losartan-hydrochlorothiazide (HYZAAR) 50-12.5  MG per tablet    Sig: Take 1 tablet by mouth daily.    Dispense:  30 tablet    Refill:  4  . metFORMIN (GLUCOPHAGE) 1000 MG tablet    Sig: Take 1 tablet (1,000 mg total) by mouth 2 (two) times daily with a meal.    Dispense:  60 tablet    Refill:  7  . pravastatin (PRAVACHOL) 40 MG tablet    Sig: Take 1 tablet (40 mg total) by mouth daily.    Dispense:  30 tablet    Refill:  4    Immunization History  Administered Date(s) Administered  . Influenza Split 08/08/2012    History reviewed. No pertinent family history.  History  Substance Use Topics  . Smoking status: Never Smoker   . Smokeless tobacco: Not on file  . Alcohol Use: No    Review of Systems   As noted in HPI  Filed Vitals:   02/03/15 1207  BP: 123/84  Pulse: 82  Temp: 98 F (36.7 C)  Resp: 16    Physical Exam  Physical Exam  Constitutional: No distress.  Eyes: EOM are normal. Pupils are equal, round, and reactive to light.  Cardiovascular: Normal rate and  regular rhythm.   Pulmonary/Chest: Breath sounds normal. No respiratory distress. He has no wheezes. He has no rales.  Musculoskeletal: He exhibits no edema.    Labs   Lab Results  Component Value Date   WBC 7.6 05/05/2013   HGB 13.5 05/05/2013   HCT 37.8* 05/05/2013   PLT 364 05/05/2013   GLUCOSE 157* 05/05/2013   CHOL 161 05/05/2013   TRIG 103 05/05/2013   HDL 45 05/05/2013   LDLCALC 95 05/05/2013   ALT 17 05/05/2013   AST 18 05/05/2013   NA 134* 05/05/2013   K 4.7 05/05/2013   CL 101 05/05/2013   CREATININE 0.86 05/05/2013   BUN 16 05/05/2013   CO2 24 05/05/2013   TSH 2.254 05/05/2013   PSA 2.27 04/27/2008   HGBA1C 9.40 02/03/2015   MICROALBUR 11.65* 10/21/2009    Lab Results  Component Value Date   HGBA1C 9.40 02/03/2015   HGBA1C 8.7 06/09/2014   HGBA1C 8.8 10/16/2013     Assessment and Plan  Other specified diabetes mellitus without complications - Plan:  Results for orders placed or performed in visit on 02/03/15  Glucose (CBG)  Result Value Ref Range   POC Glucose 134.0 (A) 70 - 99 mg/dl  HgB A1c  Result Value Ref Range   Hemoglobin A1C 9.40    Hemoglobin A1c has trended up, I have advised patient for diabetes meal planning, increased the dose of January 2 100 mg, continue with metformin, glipizide, check A1c in 3 months  glipiZIDE (GLIPIZIDE XL) 10 MG 24 hr tablet, sitaGLIPtin (JANUVIA) 100 MG tablet, metFORMIN (GLUCOPHAGE) 1000 MG tablet, COMPLETE METABOLIC PANEL WITH GFR  Hyperlipemia - Plan:Currently patient is on  pravastatin (PRAVACHOL) 40 MG tablet, repeat Lipid panel  Essential hypertension - Plan:blood pressure is well controlled, continue with current meds  amLODipine (NORVASC) 10 MG tablet, losartan-hydrochlorothiazide (HYZAAR) 50-12.5 MG per tablet   Return in about 3 months (around 05/06/2015), or if symptoms worsen or fail to improve.   This note has been created with Surveyor, quantity. Any  transcriptional errors are unintentional.    Lorayne Marek, MD

## 2015-02-04 ENCOUNTER — Telehealth: Payer: Self-pay

## 2015-02-04 LAB — COMPLETE METABOLIC PANEL WITH GFR
ALT: 16 U/L (ref 9–46)
AST: 17 U/L (ref 10–35)
Albumin: 4.5 g/dL (ref 3.6–5.1)
Alkaline Phosphatase: 60 U/L (ref 40–115)
BUN: 17 mg/dL (ref 7–25)
CO2: 23 mEq/L (ref 20–31)
Calcium: 10.4 mg/dL — ABNORMAL HIGH (ref 8.6–10.3)
Chloride: 100 mEq/L (ref 98–110)
Creat: 0.89 mg/dL (ref 0.70–1.25)
GFR, Est African American: >89 mL/min (ref >=60)
GFR, Est Non African American: >89 mL/min (ref >=60)
Glucose, Bld: 136 mg/dL — ABNORMAL HIGH (ref 65–99)
Potassium: 4.6 mEq/L (ref 3.5–5.3)
Sodium: 140 mEq/L (ref 135–146)
Total Bilirubin: 0.8 mg/dL (ref 0.2–1.2)
Total Protein: 7.4 g/dL (ref 6.1–8.1)

## 2015-02-04 LAB — LIPID PANEL
Cholesterol: 205 mg/dL — ABNORMAL HIGH (ref 125–200)
HDL: 52 mg/dL (ref >=40)
LDL Cholesterol: 143 mg/dL — ABNORMAL HIGH (ref <130)
Total CHOL/HDL Ratio: 3.9 Ratio (ref <=5.0)
Triglycerides: 52 mg/dL (ref <150)
VLDL: 10 mg/dL (ref <30)

## 2015-02-04 NOTE — Telephone Encounter (Signed)
Patient not available Left message on voice mail to return our call 

## 2015-02-04 NOTE — Telephone Encounter (Signed)
-----   Message from Lorayne Marek, MD sent at 02/04/2015 12:09 PM EDT ----- Call and let the patient know that his cholesterol has increased as compared to last year, advised patient for low carbohydrate diet, compliance in taking Lipitor, recheck fasting lipid panel on the following visit.

## 2015-02-05 ENCOUNTER — Telehealth: Payer: Self-pay

## 2015-02-05 NOTE — Telephone Encounter (Signed)
Patient not available Left message on voice mail to return our call 

## 2015-02-05 NOTE — Telephone Encounter (Signed)
Patient came into office stating he received vm to contact us back. Please f/u with patient

## 2015-02-08 ENCOUNTER — Telehealth: Payer: Self-pay

## 2015-02-08 NOTE — Telephone Encounter (Signed)
Patient came into facility to request blood work results, please f/u with pt. °

## 2015-02-08 NOTE — Telephone Encounter (Signed)
Returned patient phone call and he is aware of his lab results

## 2015-02-17 ENCOUNTER — Telehealth: Payer: Self-pay | Admitting: Internal Medicine

## 2015-02-17 NOTE — Telephone Encounter (Signed)
Patient came into office requesting eye test for diabetes, patient received a letter from insurance requesting to get an appointment . Please f/u with patient

## 2015-03-16 ENCOUNTER — Other Ambulatory Visit: Payer: Self-pay | Admitting: Internal Medicine

## 2015-04-22 ENCOUNTER — Ambulatory Visit: Payer: 59 | Attending: Internal Medicine | Admitting: Internal Medicine

## 2015-04-22 ENCOUNTER — Encounter: Payer: Self-pay | Admitting: Internal Medicine

## 2015-04-22 VITALS — BP 123/72 | HR 75 | Temp 98.0°F | Resp 16 | Ht 64.0 in | Wt 148.2 lb

## 2015-04-22 DIAGNOSIS — I10 Essential (primary) hypertension: Secondary | ICD-10-CM | POA: Insufficient documentation

## 2015-04-22 DIAGNOSIS — Z7984 Long term (current) use of oral hypoglycemic drugs: Secondary | ICD-10-CM | POA: Insufficient documentation

## 2015-04-22 DIAGNOSIS — Z79899 Other long term (current) drug therapy: Secondary | ICD-10-CM | POA: Insufficient documentation

## 2015-04-22 DIAGNOSIS — E119 Type 2 diabetes mellitus without complications: Secondary | ICD-10-CM | POA: Diagnosis not present

## 2015-04-22 DIAGNOSIS — Z7982 Long term (current) use of aspirin: Secondary | ICD-10-CM | POA: Insufficient documentation

## 2015-04-22 LAB — BASIC METABOLIC PANEL
BUN: 10 mg/dL (ref 7–25)
CO2: 27 mmol/L (ref 20–31)
Calcium: 9.9 mg/dL (ref 8.6–10.3)
Chloride: 103 mmol/L (ref 98–110)
Creat: 0.94 mg/dL (ref 0.70–1.25)
Glucose, Bld: 160 mg/dL — ABNORMAL HIGH (ref 65–99)
Potassium: 4.7 mmol/L (ref 3.5–5.3)
Sodium: 137 mmol/L (ref 135–146)

## 2015-04-22 LAB — POCT GLYCOSYLATED HEMOGLOBIN (HGB A1C): Hemoglobin A1C: 8.5

## 2015-04-22 LAB — GLUCOSE, POCT (MANUAL RESULT ENTRY): POC Glucose: 198 mg/dl — AB (ref 70–99)

## 2015-04-22 MED ORDER — LINAGLIPTIN 5 MG PO TABS: 5.0000 mg | ORAL_TABLET | Freq: Every day | ORAL | Status: DC

## 2015-04-22 NOTE — Progress Notes (Signed)
Patient ID: Jerry Serrano, male   DOB: 12-Mar-1951, 64 y.o.   MRN: [id number] SUBJECTIVE: 64 y.o. male for follow up of diabetes and hypertension. Diabetic Review of Systems - medication compliance: compliant most of the time, diabetic diet compliance: compliant most of the time, further diabetic ROS: no polyuria or polydipsia, no chest pain, dyspnea or TIA's, no numbness, tingling or pain in extremities, no unusual visual symptoms, no hypoglycemia.  Other symptoms and concerns: Patient states that he has been having a hard time affording Januvia and would like to be switched if possible. He has already received his flu vaccine at his local pharmacy.   Current Outpatient Prescriptions  Medication Sig Dispense Refill  . amLODipine (NORVASC) 10 MG tablet Take 1 tablet (10 mg total) by mouth daily. 30 tablet 4  . aspirin EC 81 MG tablet Take 1 tablet (81 mg total) by mouth daily. 30 tablet 0  . benzonatate (TESSALON) 100 MG capsule Take 1 capsule (100 mg total) by mouth 3 (three) times daily as needed for cough. 30 capsule 1  . glipiZIDE (GLIPIZIDE XL) 10 MG 24 hr tablet Take 1 tablet (10 mg total) by mouth 2 (two) times daily. 60 tablet 4  . losartan-hydrochlorothiazide (HYZAAR) 50-12.5 MG per tablet Take 1 tablet by mouth daily. 30 tablet 4  . metFORMIN (GLUCOPHAGE) 1000 MG tablet Take 1 tablet (1,000 mg total) by mouth 2 (two) times daily with a meal. 60 tablet 7  . pravastatin (PRAVACHOL) 40 MG tablet Take 1 tablet (40 mg total) by mouth daily. 30 tablet 4  . sitaGLIPtin (JANUVIA) 100 MG tablet Take 0.5 tablets (50 mg total) by mouth daily. 30 tablet 4  . albuterol (PROVENTIL HFA;VENTOLIN HFA) 108 (90 BASE) MCG/ACT inhaler Inhale 2 puffs into the lungs every 4 (four) hours as needed for wheezing or shortness of breath (cough). 3.7 g 0  . betamethasone dipropionate (DIPROLENE) 0.05 % ointment Apply topically 2 (two) times daily. 30 g 0  . Blood Glucose Monitoring Suppl (TRUERESULT BLOOD GLUCOSE)  W/DEVICE KIT 1 Package by Does not apply route as directed. 1 each 0  . glucose blood (TRUETEST TEST) test strip Use as instructed 100 each 12  . TRUEPLUS LANCETS 28G MISC 1 Package by Does not apply route as directed. 100 each 12   No current facility-administered medications for this visit.    OBJECTIVE: Appearance: alert, well appearing, and in no distress, oriented to person, place, and time and normal appearing weight. BP 123/72 mmHg  Pulse 75  Temp(Src) 98 F (36.7 C)  Resp 16  Ht _0  (1.626 m)  Wt 148 lb 3.2 oz (67.223 kg)  BMI 25.43 kg/m2  SpO2 100%  Exam: heart sounds normal rate, regular rhythm, normal S1, S2, no murmurs, rubs, clicks or gallops, no JVD, chest clear, no carotid bruits, feet: warm, good capillary refill, no trophic changes or ulcerative lesions, normal DP and PT pulses, normal monofilament exam and normal sensory exam  ASSESSMENT: Diabetes Mellitus: needs improvement and needs to follow diet more regularly  HTN: Patient blood pressure is stable and may continue on current medication.  Education on diet, exercise, and modifiable risk factors discussed. Will obtain appropriate labs as needed. Will follow up in 3-6 months.    PLAN: See orders for this visit as documented in the electronic medical record. Issues reviewed with him: diabetic diet discussed in detail, written exchange diet given, low cholesterol diet, weight control and daily exercise discussed, foot care discussed and Podiatry visits discussed,  annual eye examinations at Ophthalmology discussed and long term diabetic complications discussed.   Return in about 3 months (around 07/23/2015) for DM/HTN.  Lance Bosch, NP 04/25/2015 7:58 PM

## 2015-04-22 NOTE — Progress Notes (Signed)
Patient here for follow up on his HTN and diabetes Patient is having a hard time affording [place] and want to know if  It can be changed to something else Patient received his flu vaccine at [facility] about three weeks ago

## 2015-04-22 NOTE — Patient Instructions (Signed)
Stop Januvia and switch to Tradjenta. We will monitor you in 3 months

## 2015-04-23 ENCOUNTER — Telehealth: Payer: Self-pay

## 2015-04-23 LAB — MICROALBUMIN, URINE: Microalb, Ur: 0.4 mg/dL (ref <2.0)

## 2015-04-23 NOTE — Telephone Encounter (Signed)
-----   Message from Lance Bosch, NP sent at 04/23/2015 12:19 PM EDT ----- Labs are within normal limits

## 2015-04-23 NOTE — Telephone Encounter (Signed)
Spoke with patient and he is aware of his normal lab results 

## 2015-07-01 ENCOUNTER — Ambulatory Visit: Payer: Self-pay

## 2015-07-01 ENCOUNTER — Ambulatory Visit (INDEPENDENT_AMBULATORY_CARE_PROVIDER_SITE_OTHER): Payer: 59 | Admitting: Emergency Medicine

## 2015-07-01 ENCOUNTER — Ambulatory Visit (INDEPENDENT_AMBULATORY_CARE_PROVIDER_SITE_OTHER): Payer: PPO

## 2015-07-01 VITALS — BP 132/84 | HR 80 | Temp 97.8°F | Resp 16 | Ht 64.0 in | Wt 145.0 lb

## 2015-07-01 DIAGNOSIS — M549 Dorsalgia, unspecified: Secondary | ICD-10-CM | POA: Diagnosis not present

## 2015-07-01 DIAGNOSIS — M79642 Pain in left hand: Secondary | ICD-10-CM | POA: Diagnosis not present

## 2015-07-01 DIAGNOSIS — M79643 Pain in unspecified hand: Secondary | ICD-10-CM | POA: Diagnosis not present

## 2015-07-01 DIAGNOSIS — M19049 Primary osteoarthritis, unspecified hand: Secondary | ICD-10-CM

## 2015-07-01 DIAGNOSIS — M79641 Pain in right hand: Secondary | ICD-10-CM

## 2015-07-01 DIAGNOSIS — M1990 Unspecified osteoarthritis, unspecified site: Secondary | ICD-10-CM | POA: Diagnosis not present

## 2015-07-01 MED ORDER — DICLOFENAC SODIUM 75 MG PO TBEC: 75.0000 mg | DELAYED_RELEASE_TABLET | Freq: Two times a day (BID) | ORAL | Status: DC | PRN

## 2015-07-01 MED ORDER — METHOCARBAMOL 500 MG PO TABS: 500.0000 mg | ORAL_TABLET | Freq: Every evening | ORAL | Status: DC | PRN

## 2015-07-01 NOTE — Patient Instructions (Signed)
Warm should feel good on your hands.
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
## 2015-07-01 NOTE — Progress Notes (Signed)
  Medical screening examination/treatment/procedure(s) were performed by non-physician practitioner and as supervising physician I was immediately available for consultation/collaboration.     

## 2015-07-20 ENCOUNTER — Telehealth: Payer: Self-pay

## 2015-07-20 ENCOUNTER — Telehealth: Payer: Self-pay | Admitting: Internal Medicine

## 2015-07-20 NOTE — Telephone Encounter (Signed)
Pt. Needs prescriptions change due to new healthcare coverage...he now has Jerry Serrano....please follow up with patient

## 2015-07-20 NOTE — Telephone Encounter (Signed)
Returned call to patient  Patient not available Message left on voice mail to return our call

## 2015-07-21 ENCOUNTER — Telehealth: Payer: Self-pay

## 2015-07-21 DIAGNOSIS — E119 Type 2 diabetes mellitus without complications: Secondary | ICD-10-CM

## 2015-07-21 MED ORDER — LINAGLIPTIN 5 MG PO TABS: 5.0000 mg | ORAL_TABLET | Freq: Every day | ORAL | Status: DC

## 2015-07-21 NOTE — Telephone Encounter (Signed)
Pt. Returned call. Pt. Stated he has been out of his medications for three weeks. Please f/u with pt.

## 2015-07-21 NOTE — Telephone Encounter (Signed)
Returned phone call to patient  Patient is requesting a refill on [facility]

## 2015-07-29 ENCOUNTER — Other Ambulatory Visit: Payer: Self-pay

## 2015-07-29 DIAGNOSIS — E785 Hyperlipidemia, unspecified: Secondary | ICD-10-CM

## 2015-07-29 MED ORDER — PRAVASTATIN SODIUM 40 MG PO TABS: 40.0000 mg | ORAL_TABLET | Freq: Every day | ORAL | Status: DC

## 2015-08-03 ENCOUNTER — Telehealth: Payer: Self-pay | Admitting: Internal Medicine

## 2015-08-03 NOTE — Telephone Encounter (Signed)
Patient came into office requesting medication change due to new insurance,patient states he has not started medication due to medication not being covered by insurance. Please f/u with pt

## 2015-08-04 ENCOUNTER — Telehealth: Payer: Self-pay

## 2015-08-04 ENCOUNTER — Other Ambulatory Visit: Payer: Self-pay | Admitting: Internal Medicine

## 2015-08-04 NOTE — Telephone Encounter (Signed)
I thought I switched his medication to Januvia in the past. You may resend the Januvia if needed

## 2015-08-04 NOTE — Telephone Encounter (Signed)
Returned phone call to patient Patient stated he has new insurance and his [place] is not  Covered Patient is requesting a new medication  be prescribed

## 2015-08-10 ENCOUNTER — Telehealth: Payer: Self-pay | Admitting: Internal Medicine

## 2015-08-10 NOTE — Telephone Encounter (Signed)
Pt is here and states that the pharmacy informed him that he would be unable to retrieve his medications, linagliptin (TRADJENTA) 5 MG TABS and sitaGLIPtin (JANUVIA) 25 MG tablet, because his insurance is refusing to pay for it.  Pt likely needs a prior auth. For this med Please inform pt when complete.  Sadie Reynolds, ASA

## 2015-08-16 ENCOUNTER — Other Ambulatory Visit: Payer: Self-pay

## 2015-08-16 MED ORDER — SITAGLIPTIN PHOSPHATE 50 MG PO TABS: 50.0000 mg | ORAL_TABLET | Freq: Every day | ORAL | Status: DC

## 2015-08-17 ENCOUNTER — Telehealth: Payer: Self-pay

## 2015-08-17 ENCOUNTER — Other Ambulatory Visit: Payer: Self-pay | Admitting: Internal Medicine

## 2015-08-17 MED ORDER — SAXAGLIPTIN HCL 5 MG PO TABS: 5.0000 mg | ORAL_TABLET | Freq: Every day | ORAL | Status: DC

## 2015-08-17 NOTE — Telephone Encounter (Signed)
CMA called patient, patient verified name and DOB. CMA called patient to follow up with diabetes medication. Patient informed me that he was here at [facility]. Patient states that a new rx was sent over to the Pharmacy here. Patient was given the name of the new med, Onglyza (saxagliptin) for his diabetes. Patient states he understood with no further questions.

## 2015-08-18 MED FILL — ONGLYZA 5 MG TABLET: 5 | 30 days supply | Qty: 30 | Fill #0

## 2015-08-25 ENCOUNTER — Telehealth: Payer: Self-pay | Admitting: Internal Medicine

## 2015-08-25 DIAGNOSIS — I10 Essential (primary) hypertension: Secondary | ICD-10-CM

## 2015-08-25 MED ORDER — AMLODIPINE BESYLATE 10 MG PO TABS: 10.0000 mg | ORAL_TABLET | Freq: Every day | ORAL | Status: DC

## 2015-08-25 MED ORDER — LOSARTAN POTASSIUM-HCTZ 50-12.5 MG PO TABS: 1.0000 | ORAL_TABLET | Freq: Every day | ORAL | Status: DC

## 2015-08-25 MED FILL — AMLODIPINE BESYLATE 10 MG T: 10 | 30 days supply | Qty: 30 | Fill #0

## 2015-08-25 MED FILL — LOSARTAN-HCTZ 50-12.5 MG TA: 50-12.5 | 30 days supply | Qty: 30 | Fill #0

## 2015-08-25 NOTE — Telephone Encounter (Signed)
Patient came in requesting a medication refill for losartan and amlodipine.  [facility] on [address]

## 2015-08-26 ENCOUNTER — Other Ambulatory Visit: Payer: Self-pay

## 2015-08-26 DIAGNOSIS — I10 Essential (primary) hypertension: Secondary | ICD-10-CM

## 2015-08-26 MED ORDER — LOSARTAN POTASSIUM-HCTZ 50-12.5 MG PO TABS: 1.0000 | ORAL_TABLET | Freq: Every day | ORAL | Status: DC

## 2015-08-27 ENCOUNTER — Telehealth: Payer: Self-pay

## 2015-08-27 NOTE — Telephone Encounter (Signed)
Pt. Came into facility stating that his medication of amlodipine and losartan have not been refilled. Pt. Was told that his medication had been refilled. Pt. Would like to speak to nurse. Please f/u

## 2015-08-27 NOTE — Telephone Encounter (Signed)
Returned phone call to patient   Patient stated he had picked up his medication already

## 2015-09-06 ENCOUNTER — Other Ambulatory Visit: Payer: Self-pay | Admitting: Internal Medicine

## 2015-09-15 ENCOUNTER — Other Ambulatory Visit: Payer: Self-pay | Admitting: Internal Medicine

## 2015-09-15 ENCOUNTER — Other Ambulatory Visit: Payer: Self-pay | Admitting: Pharmacist

## 2015-09-15 MED ORDER — BAYER CONTOUR MONITOR DEVI: Status: DC

## 2015-09-15 MED ORDER — GLUCOSE BLOOD VI STRP: ORAL_STRIP | Status: DC

## 2015-09-15 MED ORDER — MICROLET LANCETS MISC: Status: DC

## 2015-09-15 MED ORDER — TRUE METRIX METER W/DEVICE KIT: PACK | Status: DC

## 2015-09-20 MED FILL — ONGLYZA 5 MG TABLET: 5 | 30 days supply | Qty: 30 | Fill #1

## 2015-09-23 MED FILL — AMLODIPINE BESYLATE 10 MG T: 10 | 30 days supply | Qty: 30 | Fill #1

## 2015-10-14 MED FILL — ONGLYZA 5 MG TABLET: 5 | 30 days supply | Qty: 30 | Fill #2

## 2015-10-25 MED FILL — AMLODIPINE BESYLATE 10 MG T: 10 | 30 days supply | Qty: 30 | Fill #2

## 2015-11-12 MED FILL — ONGLYZA 5 MG TABLET: 5 | 30 days supply | Qty: 30 | Fill #3

## 2015-11-24 ENCOUNTER — Other Ambulatory Visit: Payer: Self-pay | Admitting: Internal Medicine

## 2015-11-24 MED FILL — AMLODIPINE BESYLATE 10 MG T: 10 | 30 days supply | Qty: 30 | Fill #0

## 2015-12-20 ENCOUNTER — Other Ambulatory Visit: Payer: Self-pay | Admitting: Internal Medicine

## 2015-12-20 MED FILL — ONGLYZA 5 MG TABLET: 5 | 30 days supply | Qty: 30 | Fill #0

## 2015-12-22 ENCOUNTER — Other Ambulatory Visit: Payer: Self-pay | Admitting: Internal Medicine

## 2015-12-22 MED FILL — AMLODIPINE BESYLATE 10 MG T: 10 | 30 days supply | Qty: 30 | Fill #0

## 2015-12-30 ENCOUNTER — Telehealth: Payer: Self-pay | Admitting: Internal Medicine

## 2015-12-30 DIAGNOSIS — I10 Essential (primary) hypertension: Secondary | ICD-10-CM

## 2015-12-30 MED ORDER — LOSARTAN POTASSIUM-HCTZ 50-12.5 MG PO TABS: 1.0000 | ORAL_TABLET | Freq: Every day | ORAL | Status: DC

## 2015-12-30 MED FILL — LOSARTAN-HCTZ 50-12.5 MG TA: 50-12.5 | 30 days supply | Qty: 30 | Fill #1

## 2015-12-30 NOTE — Telephone Encounter (Signed)
Medication Refill: losartan-hydrochlorothiazide (HYZAAR) 50-12.5 MG tablet

## 2015-12-30 NOTE — Telephone Encounter (Signed)
Refilled losartan-HCTZ x 30 days - patient must have office visit for more refills

## 2016-01-03 ENCOUNTER — Telehealth: Payer: Self-pay | Admitting: Internal Medicine

## 2016-01-03 DIAGNOSIS — E785 Hyperlipidemia, unspecified: Secondary | ICD-10-CM

## 2016-01-03 MED ORDER — PRAVASTATIN SODIUM 40 MG PO TABS: 40.0000 mg | ORAL_TABLET | Freq: Every day | ORAL | Status: DC

## 2016-01-03 NOTE — Telephone Encounter (Signed)
Pravastatin refilled x 30 days

## 2016-01-03 NOTE — Telephone Encounter (Signed)
Patient needs a refill for pravastatin please follow up.

## 2016-01-04 MED FILL — PRAVASTATIN NA 40 MG TAB: 40 | 30 days supply | Qty: 30 | Fill #0

## 2016-01-25 MED FILL — AMLODIPINE BESYLATE 10 MG T: 10 | 30 days supply | Qty: 30 | Fill #1

## 2016-01-26 ENCOUNTER — Other Ambulatory Visit: Payer: Self-pay | Admitting: Internal Medicine

## 2016-01-26 NOTE — Telephone Encounter (Signed)
Refill request for Onglyza

## 2016-01-27 MED FILL — ONGLYZA 5 MG TABLET: 5 | 30 days supply | Qty: 30 | Fill #0

## 2016-01-31 ENCOUNTER — Other Ambulatory Visit: Payer: Self-pay | Admitting: Internal Medicine

## 2016-01-31 MED FILL — LOSARTAN-HCTZ 50-12.5 MG TA: 50-12.5 | 30 days supply | Qty: 30 | Fill #2

## 2016-01-31 NOTE — Telephone Encounter (Signed)
Rx request 

## 2016-02-01 ENCOUNTER — Other Ambulatory Visit: Payer: Self-pay | Admitting: Internal Medicine

## 2016-02-01 DIAGNOSIS — E785 Hyperlipidemia, unspecified: Secondary | ICD-10-CM

## 2016-02-02 NOTE — Telephone Encounter (Signed)
Rx Request 

## 2016-02-03 MED FILL — PRAVASTATIN NA 40 MG TAB: 40 | 30 days supply | Qty: 30 | Fill #0

## 2016-02-08 ENCOUNTER — Other Ambulatory Visit: Payer: Self-pay | Admitting: Pharmacist

## 2016-02-08 DIAGNOSIS — E139 Other specified diabetes mellitus without complications: Secondary | ICD-10-CM

## 2016-02-08 MED ORDER — METFORMIN HCL 1000 MG PO TABS: 1000.0000 mg | ORAL_TABLET | Freq: Two times a day (BID) | ORAL | 0 refills | Status: DC

## 2016-02-14 ENCOUNTER — Other Ambulatory Visit: Payer: Self-pay | Admitting: Pharmacist

## 2016-02-14 DIAGNOSIS — E139 Other specified diabetes mellitus without complications: Secondary | ICD-10-CM

## 2016-02-14 MED ORDER — METFORMIN HCL 1000 MG PO TABS: 1000.0000 mg | ORAL_TABLET | Freq: Two times a day (BID) | ORAL | 0 refills | Status: DC

## 2016-02-16 DIAGNOSIS — Z23 Encounter for immunization: Secondary | ICD-10-CM | POA: Diagnosis not present

## 2016-02-17 MED FILL — TRUE METRIX TEST STRIP: 25 days supply | Qty: 100 | Fill #0

## 2016-02-22 ENCOUNTER — Telehealth: Payer: Self-pay | Admitting: Internal Medicine

## 2016-02-22 MED ORDER — AMLODIPINE BESYLATE 10 MG PO TABS: 10.0000 mg | ORAL_TABLET | Freq: Every day | ORAL | 0 refills | Status: DC

## 2016-02-22 MED FILL — AMLODIPINE BESYLATE 10 MG T: 10 | 30 days supply | Qty: 30 | Fill #0

## 2016-02-22 NOTE — Telephone Encounter (Signed)
Appt scheduled for 02/29/16 with Dr. Jarold Song. Amlodipine refilled x 30 days

## 2016-02-22 NOTE — Telephone Encounter (Signed)
Patient needs a refill for amlodipine. Please follow up.

## 2016-02-28 ENCOUNTER — Other Ambulatory Visit: Payer: Self-pay | Admitting: *Deleted

## 2016-02-28 DIAGNOSIS — E119 Type 2 diabetes mellitus without complications: Secondary | ICD-10-CM

## 2016-02-28 MED ORDER — GLUCOSE BLOOD VI STRP: ORAL_STRIP | 12 refills | Status: DC

## 2016-02-28 MED ORDER — ACCU-CHEK SOFTCLIX LANCET DEV MISC: 12 refills | Status: DC

## 2016-02-28 MED ORDER — ACCU-CHEK AVIVA PLUS W/DEVICE KIT: 1.0000 | PACK | Freq: Three times a day (TID) | 0 refills | Status: DC

## 2016-02-28 MED FILL — ACCU-CHEK SOFTCLIX LANCETS: 25 days supply | Qty: 100 | Fill #0

## 2016-02-28 MED FILL — ACCU-CHEK AVIVA PLUS TEST S: 25 days supply | Qty: 100 | Fill #0

## 2016-02-28 MED FILL — ACCU-CHEK AVIVA PLUS METER: W/DEVICE | 1 days supply | Qty: 1 | Fill #0

## 2016-02-28 NOTE — Telephone Encounter (Signed)
Accu-chek DM supplies ordered based on patients insurance.

## 2016-02-29 ENCOUNTER — Encounter: Payer: Self-pay | Admitting: Family Medicine

## 2016-02-29 ENCOUNTER — Ambulatory Visit: Payer: Medicare Other | Attending: Family Medicine | Admitting: Family Medicine

## 2016-02-29 VITALS — BP 116/77 | HR 87 | Temp 98.3°F | Ht 64.0 in | Wt 147.2 lb

## 2016-02-29 DIAGNOSIS — E119 Type 2 diabetes mellitus without complications: Secondary | ICD-10-CM

## 2016-02-29 DIAGNOSIS — M1990 Unspecified osteoarthritis, unspecified site: Secondary | ICD-10-CM

## 2016-02-29 DIAGNOSIS — M79643 Pain in unspecified hand: Secondary | ICD-10-CM

## 2016-02-29 DIAGNOSIS — Z794 Long term (current) use of insulin: Secondary | ICD-10-CM | POA: Insufficient documentation

## 2016-02-29 DIAGNOSIS — E785 Hyperlipidemia, unspecified: Secondary | ICD-10-CM | POA: Diagnosis not present

## 2016-02-29 DIAGNOSIS — M19049 Primary osteoarthritis, unspecified hand: Secondary | ICD-10-CM

## 2016-02-29 DIAGNOSIS — E139 Other specified diabetes mellitus without complications: Secondary | ICD-10-CM | POA: Diagnosis not present

## 2016-02-29 DIAGNOSIS — I10 Essential (primary) hypertension: Secondary | ICD-10-CM | POA: Diagnosis not present

## 2016-02-29 DIAGNOSIS — Z79899 Other long term (current) drug therapy: Secondary | ICD-10-CM | POA: Insufficient documentation

## 2016-02-29 LAB — COMPLETE METABOLIC PANEL WITH GFR
ALT: 17 U/L (ref 9–46)
AST: 18 U/L (ref 10–35)
Albumin: 4.3 g/dL (ref 3.6–5.1)
Alkaline Phosphatase: 72 U/L (ref 40–115)
BUN: 21 mg/dL (ref 7–25)
CO2: 26 mmol/L (ref 20–31)
Calcium: 9.7 mg/dL (ref 8.6–10.3)
Chloride: 100 mmol/L (ref 98–110)
Creat: 0.88 mg/dL (ref 0.70–1.25)
GFR, Est African American: >89 mL/min (ref >=60)
GFR, Est Non African American: 89 mL/min (ref >=60)
Glucose, Bld: 163 mg/dL — ABNORMAL HIGH (ref 65–99)
Potassium: 4.2 mmol/L (ref 3.5–5.3)
Sodium: 135 mmol/L (ref 135–146)
Total Bilirubin: 0.7 mg/dL (ref 0.2–1.2)
Total Protein: 7.2 g/dL (ref 6.1–8.1)

## 2016-02-29 LAB — LIPID PANEL
Cholesterol: 147 mg/dL (ref 125–200)
HDL: 52 mg/dL (ref >=40)
LDL Cholesterol: 83 mg/dL (ref <130)
Total CHOL/HDL Ratio: 2.8 Ratio (ref <=5.0)
Triglycerides: 58 mg/dL (ref <150)
VLDL: 12 mg/dL (ref <30)

## 2016-02-29 LAB — POCT GLYCOSYLATED HEMOGLOBIN (HGB A1C): Hemoglobin A1C: 10.1

## 2016-02-29 LAB — GLUCOSE, POCT (MANUAL RESULT ENTRY): POC Glucose: 171 mg/dl — AB (ref 70–99)

## 2016-02-29 MED ORDER — ASPIRIN EC 81 MG PO TBEC: 81.0000 mg | DELAYED_RELEASE_TABLET | Freq: Every day | ORAL | 3 refills | Status: AC

## 2016-02-29 MED ORDER — DICLOFENAC SODIUM 75 MG PO TBEC: 75.0000 mg | DELAYED_RELEASE_TABLET | Freq: Two times a day (BID) | ORAL | 1 refills | Status: DC | PRN

## 2016-02-29 MED ORDER — METFORMIN HCL 1000 MG PO TABS: 1000.0000 mg | ORAL_TABLET | Freq: Two times a day (BID) | ORAL | 3 refills | Status: DC

## 2016-02-29 MED ORDER — GLIPIZIDE ER 10 MG PO TB24: 10.0000 mg | ORAL_TABLET | Freq: Two times a day (BID) | ORAL | 4 refills | Status: DC

## 2016-02-29 MED ORDER — AMLODIPINE BESYLATE 10 MG PO TABS: 10.0000 mg | ORAL_TABLET | Freq: Every day | ORAL | 3 refills | Status: DC

## 2016-02-29 MED ORDER — PRAVASTATIN SODIUM 40 MG PO TABS: 40.0000 mg | ORAL_TABLET | Freq: Every day | ORAL | 3 refills | Status: DC

## 2016-02-29 MED ORDER — LOSARTAN POTASSIUM-HCTZ 50-12.5 MG PO TABS: 1.0000 | ORAL_TABLET | Freq: Every day | ORAL | 3 refills | Status: DC

## 2016-02-29 MED ORDER — CANAGLIFLOZIN 100 MG PO TABS: 100.0000 mg | ORAL_TABLET | Freq: Every day | ORAL | 3 refills | Status: DC

## 2016-02-29 MED FILL — DICLOFENAC SOD DR 75 MG TAB: 75 | 60 days supply | Qty: 60 | Fill #0

## 2016-02-29 MED FILL — PRAVASTATIN NA 40 MG TAB: 40 | 30 days supply | Qty: 30 | Fill #0

## 2016-02-29 MED FILL — INVOKANA 100 MG TABLET: 100 | 30 days supply | Qty: 30 | Fill #0

## 2016-02-29 MED FILL — LOSARTAN-HCTZ 50-12.5 MG TA: 50-12.5 | 30 days supply | Qty: 30 | Fill #0

## 2016-02-29 MED FILL — glipiZIDE XL 10 MG TB24: 10 | 30 days supply | Qty: 60 | Fill #0

## 2016-02-29 NOTE — Progress Notes (Signed)
Medication refills-everything Mentioned having arthritis- has not filled the voltaren

## 2016-02-29 NOTE — Progress Notes (Signed)
Subjective:  Patient ID: Jerry Serrano, male    DOB: 10-16-1950  Age: 65 y.o. MRN: [id number]  CC: Diabetes; Hypertension; and hyperlipidema   HPI Jerry Serrano is a 65 year old male with a history of type 2 diabetes mellitus (A1c 10), hypertension, hyperlipidemia, arthritis who presents today to establish care with me  He endorses compliance with all his medications however his A1c today is 10.0 and has trended up from 8.5 previously. He denies hypoglycemia and has no tingling or numbness in hands or feet. He denies blurry vision and is not up-to-date on an annual eye exam. Not compliant with prescribed exercise regimen or a diabetic diet.  Takes diclofenac for occasional pain in the joints of his hands and this helps control his symptoms.  He received his Pneumovax and flu shot at the [facility]. He has no complaints today.  Past Medical History:  Diagnosis Date  . Diabetes mellitus   . Hypertension     History reviewed. No pertinent surgical history.  No Known Allergies   Outpatient Medications Prior to Visit  Medication Sig Dispense Refill  . Blood Glucose Monitoring Suppl (ACCU-CHEK AVIVA PLUS) w/Device KIT 1 each by Does not apply route 4 (four) times daily - after meals and at bedtime. 1 kit 0  . glucose blood test strip Use as instructed 100 each 12  . Lancet Devices (ACCU-CHEK SOFTCLIX) lancets Use as instructed 1 each 12  . MICROLET LANCETS MISC Test 3 times per day 100 each 12  . saxagliptin HCl (ONGLYZA) 5 MG TABS tablet Take 1 tablet (5 mg total) by mouth daily. 30 tablet 0  . TRUEPLUS LANCETS 28G MISC 1 Package by Does not apply route as directed. 100 each 12  . amLODipine (NORVASC) 10 MG tablet Take 1 tablet (10 mg total) by mouth daily. 30 tablet 0  . aspirin EC 81 MG tablet Take 1 tablet (81 mg total) by mouth daily. 30 tablet 0  . glipiZIDE (GLIPIZIDE XL) 10 MG 24 hr tablet Take 1 tablet (10 mg total) by mouth 2 (two) times daily. 60  tablet 4  . losartan-hydrochlorothiazide (HYZAAR) 50-12.5 MG tablet Take 1 tablet by mouth daily. Must have office visit for refills 30 tablet 0  . metFORMIN (GLUCOPHAGE) 1000 MG tablet Take 1 tablet (1,000 mg total) by mouth 2 (two) times daily with a meal. 60 tablet 0  . pravastatin (PRAVACHOL) 40 MG tablet TAKE 1 TABLET BY MOUTH DAILY. 30 tablet 0  . diclofenac (VOLTAREN) 75 MG EC tablet Take 1 tablet (75 mg total) by mouth 2 (two) times daily as needed for moderate pain. (Patient not taking: Reported on 02/29/2016) 60 tablet 1  . methocarbamol (ROBAXIN) 500 MG tablet Take 1 tablet (500 mg total) by mouth at bedtime as needed for muscle spasms. (Patient not taking: Reported on 02/29/2016) 30 tablet 0   No facility-administered medications prior to visit.     ROS Review of Systems  Constitutional: Negative for activity change and appetite change.  HENT: Negative for sinus pressure and sore throat.   Eyes: Negative for visual disturbance.  Respiratory: Negative for cough, chest tightness and shortness of breath.   Cardiovascular: Negative for chest pain and leg swelling.  Gastrointestinal: Negative for abdominal distention, abdominal pain, constipation and diarrhea.  Endocrine: Negative.   Genitourinary: Negative for dysuria.  Musculoskeletal:       See history of present illness  Skin: Negative for rash.  Allergic/Immunologic: Negative.   Neurological: Negative for weakness, light-headedness and  numbness.  Psychiatric/Behavioral: Negative for dysphoric mood and suicidal ideas.    Objective:  BP 116/77 (BP Location: Right Arm, Patient Position: Sitting, Cuff Size: Large)   Pulse 87   Temp 98.3 F (36.8 C) (Oral)   Ht '5\' 4"'$  (1.626 m)   Wt 147 lb 3.2 oz (66.8 kg)   SpO2 96%   BMI 25.27 kg/m   BP/Weight 02/29/2016 07/01/2015 16/01/3709  Systolic BP 626 948 546  Diastolic BP 77 84 72  Wt. (Lbs) 147.2 145 148.2  BMI 25.27 24.88 25.43      Physical Exam  Constitutional: He is  oriented to person, place, and time. He appears well-developed and well-nourished.  Cardiovascular: Normal rate, normal heart sounds and intact distal pulses.   No murmur heard. Pulmonary/Chest: Effort normal and breath sounds normal. He has no wheezes. He has no rales. He exhibits no tenderness.  Abdominal: Soft. Bowel sounds are normal. He exhibits no distension and no mass. There is no tenderness.  Musculoskeletal: Normal range of motion.  Neurological: He is alert and oriented to person, place, and time.     Assessment & Plan:   1. Type 2 diabetes mellitus without complication, without long-term current use of insulin (HCC) Uncontrolled with A1c of 10 which has trended up from 8.5 a few months ago [place], added to regimen Discussed with the patient-insulin may be the next step of management if hyperglycemia persists Referred to ophthalmology for annual eye exam Up-to-date on Pneumovax (received from right-sided) which has been updated chart - Glucose (CBG) - HgB A1c - COMPLETE METABOLIC PANEL WITH GFR - Microalbumin / creatinine urine ratio - canagliflozin (INVOKANA) 100 MG TABS tablet; Take 1 tablet (100 mg total) by mouth daily before breakfast.  Dispense: 30 tablet; Refill: 3  2. Arthritis pain, hand Stable - diclofenac (VOLTAREN) 75 MG EC tablet; Take 1 tablet (75 mg total) by mouth 2 (two) times daily as needed for moderate pain.  Dispense: 60 tablet; Refill: 1  3. Other specified diabetes mellitus without complications (HCC) - glipiZIDE (GLIPIZIDE XL) 10 MG 24 hr tablet; Take 1 tablet (10 mg total) by mouth 2 (two) times daily.  Dispense: 60 tablet; Refill: 4 - metFORMIN (GLUCOPHAGE) 1000 MG tablet; Take 1 tablet (1,000 mg total) by mouth 2 (two) times daily with a meal.  Dispense: 60 tablet; Refill: 3  4. Essential hypertension Controlled- losartan-hydrochlorothiazide (HYZAAR) 50-12.5 MG tablet; Take 1 tablet by mouth daily.  Dispense: 30 tablet;  Refill: 3  5. Hyperlipemia Advised on low-cholesterol diet - pravastatin (PRAVACHOL) 40 MG tablet; Take 1 tablet (40 mg total) by mouth daily.  Dispense: 30 tablet; Refill: 3 - Lipid panel   Meds ordered this encounter  Medications  . amLODipine (NORVASC) 10 MG tablet    Sig: Take 1 tablet (10 mg total) by mouth daily.    Dispense:  30 tablet    Refill:  3  . aspirin EC 81 MG tablet    Sig: Take 1 tablet (81 mg total) by mouth daily.    Dispense:  30 tablet    Refill:  3  . diclofenac (VOLTAREN) 75 MG EC tablet    Sig: Take 1 tablet (75 mg total) by mouth 2 (two) times daily as needed for moderate pain.    Dispense:  60 tablet    Refill:  1  . glipiZIDE (GLIPIZIDE XL) 10 MG 24 hr tablet    Sig: Take 1 tablet (10 mg total) by mouth 2 (two) times daily.  Dispense:  60 tablet    Refill:  4  . losartan-hydrochlorothiazide (HYZAAR) 50-12.5 MG tablet    Sig: Take 1 tablet by mouth daily.    Dispense:  30 tablet    Refill:  3  . metFORMIN (GLUCOPHAGE) 1000 MG tablet    Sig: Take 1 tablet (1,000 mg total) by mouth 2 (two) times daily with a meal.    Dispense:  60 tablet    Refill:  3  . pravastatin (PRAVACHOL) 40 MG tablet    Sig: Take 1 tablet (40 mg total) by mouth daily.    Dispense:  30 tablet    Refill:  3  . canagliflozin (INVOKANA) 100 MG TABS tablet    Sig: Take 1 tablet (100 mg total) by mouth daily before breakfast.    Dispense:  30 tablet    Refill:  3    Discontinue Onglyza    Follow-up: Return in about 3 months (around 05/31/2016) for follow up on Diabetes Mellitus.   Arnoldo Morale MD

## 2016-03-01 LAB — MICROALBUMIN / CREATININE URINE RATIO
Creatinine, Urine: 84 mg/dL (ref 20–370)
Microalb Creat Ratio: 6 ug/mg{creat}
Microalb, Ur: 0.5 mg/dL

## 2016-03-02 ENCOUNTER — Telehealth: Payer: Self-pay

## 2016-03-02 NOTE — Telephone Encounter (Signed)
-----   Message from Arnoldo Morale, MD sent at 03/02/2016  1:41 PM EDT ----- Please inform the patient that labs are normal. Thank you.

## 2016-03-02 NOTE — Telephone Encounter (Signed)
Writer called patient per Dr. Jarold Song to inform him that his labs came back normal.  Patient stated understanding.

## 2016-03-03 ENCOUNTER — Encounter: Payer: Self-pay | Admitting: Family Medicine

## 2016-03-22 ENCOUNTER — Encounter: Payer: Self-pay | Admitting: Family Medicine

## 2016-03-22 DIAGNOSIS — E119 Type 2 diabetes mellitus without complications: Secondary | ICD-10-CM | POA: Diagnosis not present

## 2016-03-22 DIAGNOSIS — H25813 Combined forms of age-related cataract, bilateral: Secondary | ICD-10-CM | POA: Diagnosis not present

## 2016-03-22 LAB — HM DIABETES EYE EXAM

## 2016-03-23 MED FILL — metFORMIN HCL 1000 MG TABS: 1000 | 30 days supply | Qty: 60 | Fill #0

## 2016-03-27 MED FILL — AMLODIPINE BESYLATE 10 MG T: 10 | 30 days supply | Qty: 30 | Fill #0

## 2016-03-31 MED FILL — PRAVASTATIN NA 40 MG TAB: 40 | 30 days supply | Qty: 30 | Fill #1

## 2016-03-31 MED FILL — LOSARTAN-HCTZ 50-12.5 MG TA: 50-12.5 | 30 days supply | Qty: 30 | Fill #1

## 2016-03-31 MED FILL — INVOKANA 100 MG TABLET: 100 | 30 days supply | Qty: 30 | Fill #1

## 2016-04-26 MED FILL — AMLODIPINE BESYLATE 10 MG T: 10 | 30 days supply | Qty: 30 | Fill #1

## 2016-05-02 MED FILL — metFORMIN HCL 1000 MG TABS: 1000 | 30 days supply | Qty: 60 | Fill #1

## 2016-05-02 MED FILL — LOSARTAN-HCTZ 50-12.5 MG TA: 50-12.5 | 30 days supply | Qty: 30 | Fill #2

## 2016-05-05 MED FILL — glipiZIDE ER 10 MG TB24: 10 | 30 days supply | Qty: 60 | Fill #1

## 2016-05-05 MED FILL — PRAVASTATIN NA 40 MG TAB: 40 | 30 days supply | Qty: 30 | Fill #2

## 2016-05-17 ENCOUNTER — Encounter: Payer: Self-pay | Admitting: Family Medicine

## 2016-05-17 ENCOUNTER — Ambulatory Visit: Payer: Medicare Other | Attending: Family Medicine | Admitting: Family Medicine

## 2016-05-17 VITALS — BP 128/80 | HR 82 | Temp 97.6°F | Resp 16 | Ht 64.0 in | Wt 143.0 lb

## 2016-05-17 DIAGNOSIS — E7800 Pure hypercholesterolemia, unspecified: Secondary | ICD-10-CM

## 2016-05-17 DIAGNOSIS — Z79899 Other long term (current) drug therapy: Secondary | ICD-10-CM | POA: Diagnosis not present

## 2016-05-17 DIAGNOSIS — Z7982 Long term (current) use of aspirin: Secondary | ICD-10-CM | POA: Diagnosis not present

## 2016-05-17 DIAGNOSIS — I10 Essential (primary) hypertension: Secondary | ICD-10-CM | POA: Insufficient documentation

## 2016-05-17 DIAGNOSIS — Z7984 Long term (current) use of oral hypoglycemic drugs: Secondary | ICD-10-CM | POA: Diagnosis not present

## 2016-05-17 DIAGNOSIS — E785 Hyperlipidemia, unspecified: Secondary | ICD-10-CM | POA: Diagnosis not present

## 2016-05-17 DIAGNOSIS — E119 Type 2 diabetes mellitus without complications: Secondary | ICD-10-CM | POA: Diagnosis not present

## 2016-05-17 DIAGNOSIS — Z9119 Patient's noncompliance with other medical treatment and regimen: Secondary | ICD-10-CM | POA: Diagnosis not present

## 2016-05-17 LAB — GLUCOSE, POCT (MANUAL RESULT ENTRY): POC Glucose: 182 mg/dl — AB (ref 70–99)

## 2016-05-17 LAB — POCT GLYCOSYLATED HEMOGLOBIN (HGB A1C): Hemoglobin A1C: 9.9

## 2016-05-17 MED ORDER — SITAGLIPTIN PHOSPHATE 100 MG PO TABS: 100.0000 mg | ORAL_TABLET | Freq: Every day | ORAL | 5 refills | Status: DC

## 2016-05-17 NOTE — Progress Notes (Signed)
CC: Follow-up on diabetes mellitus  HPI: Jerry Serrano is a 65 y.o. male here today for a follow up visit.   Patient has past medical history of type 2 diabetes mellitus (A1c 9.9 from today), hypertension, hyperlipidemia who presents today for follow-up visit. He was commenced on Invokana at his last office visit but informs me that "it does not work for him"and he would like to go back to [place] but on further questioning he admits that the co-pay for invokana, is $40 while it is $5 for Januvia. Compliance with Invokana cannot be ascertained and he endorses maintaining dietary indiscretion as he admits to eating a lot of rice and starchy foods.  He has been compliant with his antihypertensive.  Patient has No headache, No chest pain, No abdominal pain - No Nausea, No new weakness tingling or numbness, No Cough - SOB.   No Known Allergies Past Medical History:  Diagnosis Date  . Diabetes mellitus   . Hypertension    Current Outpatient Prescriptions on File Prior to Visit  Medication Sig Dispense Refill  . amLODipine (NORVASC) 10 MG tablet Take 1 tablet (10 mg total) by mouth daily. 30 tablet 3  . aspirin EC 81 MG tablet Take 1 tablet (81 mg total) by mouth daily. 30 tablet 3  . diclofenac (VOLTAREN) 75 MG EC tablet Take 1 tablet (75 mg total) by mouth 2 (two) times daily as needed for moderate pain. 60 tablet 1  . glipiZIDE (GLIPIZIDE XL) 10 MG 24 hr tablet Take 1 tablet (10 mg total) by mouth 2 (two) times daily. 60 tablet 4  . losartan-hydrochlorothiazide (HYZAAR) 50-12.5 MG tablet Take 1 tablet by mouth daily. 30 tablet 3  . metFORMIN (GLUCOPHAGE) 1000 MG tablet Take 1 tablet (1,000 mg total) by mouth 2 (two) times daily with a meal. 60 tablet 3  . pravastatin (PRAVACHOL) 40 MG tablet Take 1 tablet (40 mg total) by mouth daily. 30 tablet 3  . Blood Glucose Monitoring Suppl (ACCU-CHEK AVIVA PLUS) w/Device KIT 1 each by Does not apply route 4 (four) times daily - after meals  and at bedtime. 1 kit 0  . glucose blood test strip Use as instructed 100 each 12  . Lancet Devices (ACCU-CHEK SOFTCLIX) lancets Use as instructed 1 each 12  . MICROLET LANCETS MISC Test 3 times per day 100 each 12  . TRUEPLUS LANCETS 28G MISC 1 Package by Does not apply route as directed. 100 each 12   No current facility-administered medications on file prior to visit.    No family history on file. Social History   Social History  . Marital status: Married    Spouse name: N/A  . Number of children: N/A  . Years of education: N/A   Occupational History  . Not on file.   Social History Main Topics  . Smoking status: Never Smoker  . Smokeless tobacco: Never Used  . Alcohol use No  . Drug use: No  . Sexual activity: Not on file   Other Topics Concern  . Not on file   Social History Narrative  . No narrative on file    Review of Systems: Constitutional: Negative for fever, chills, diaphoresis, activity change, appetite change and fatigue. HENT: Negative for ear pain, nosebleeds, congestion, facial swelling, rhinorrhea, neck pain, neck stiffness and ear discharge.  Eyes: Negative for pain, discharge, redness, itching and visual disturbance. Respiratory: Negative for cough, choking, chest tightness, shortness of breath, wheezing and stridor.  Cardiovascular: Negative for chest  pain, palpitations and leg swelling. Gastrointestinal: Negative for abdominal distention. Genitourinary: Negative for dysuria, urgency, frequency, hematuria, flank pain, decreased urine volume, difficulty urinating and dyspareunia.  Musculoskeletal: Negative for back pain, joint swelling, arthralgias and gait problem. Neurological: Negative for dizziness, tremors, seizures, syncope, facial asymmetry, speech difficulty, weakness, light-headedness, numbness and headaches.  Hematological: Negative for adenopathy. Does not bruise/bleed easily. Psychiatric/Behavioral: Negative for hallucinations, behavioral  problems, confusion, dysphoric mood, decreased concentration and agitation.    Objective:   Vitals:   05/17/16 1040  BP: 128/80  Pulse: 82  Resp: 16  Temp: 97.6 F (36.4 C)    Physical Exam: Constitutional: Patient appears well-developed and well-nourished. No distress. HENT: Normocephalic, atraumatic, External right and left ear normal. Oropharynx is clear and moist.  Eyes: Conjunctivae and EOM are normal. PERRLA, no scleral icterus. Neck: Normal ROM. Neck supple. No JVD. No tracheal deviation. No thyromegaly. CVS: RRR, S1/S2 +, no murmurs, no gallops, no carotid bruit.  Pulmonary: Effort and breath sounds normal, no stridor, rhonchi, wheezes, rales.  Abdominal: Soft. BS +,  no distension, tenderness, rebound or guarding.  Musculoskeletal: Normal range of motion. No edema and no tenderness.  Lymphadenopathy: No lymphadenopathy noted, cervical, inguinal or axillary Neuro: Alert. Normal reflexes, muscle tone coordination. No cranial nerve deficit. Skin: Skin is warm and dry. No rash noted. Not diaphoretic. No erythema. No pallor. Psychiatric: Normal mood and affect. Behavior, judgment, thought content normal.  Lab Results  Component Value Date   WBC 7.6 05/05/2013   HGB 13.5 05/05/2013   HCT 37.8 (L) 05/05/2013   MCV 78.4 05/05/2013   PLT 364 05/05/2013   Lab Results  Component Value Date   CREATININE 0.88 02/29/2016   BUN 21 02/29/2016   NA 135 02/29/2016   K 4.2 02/29/2016   CL 100 02/29/2016   CO2 26 02/29/2016    Lab Results  Component Value Date   HGBA1C 9.9 05/17/2016   Lipid Panel     Component Value Date/Time   CHOL 147 02/29/2016 1018   TRIG 58 02/29/2016 1018   HDL 52 02/29/2016 1018   CHOLHDL 2.8 02/29/2016 1018   VLDL 12 02/29/2016 1018   LDLCALC 83 02/29/2016 1018        Assessment and plan:  1. Type 2 diabetes mellitus without complication, without long-term current use of insulin (HCC) Uncontrolled with A1c of 9.9 due to dietary  indiscretion and noncompliance with Invokana Switched to Januvia as per patient request  Discontinue Onglyza, Invokana Continue metformin and glipizide Will review blood sugar log at next visit. Keep blood sugar logs with fasting goals of 80-120 mg/dl, random of less than 180 and in the event of sugars less than 60 mg/dl or greater than 400 mg/dl please notify the clinic ASAP. It is recommended that you undergo annual eye exams and annual foot exams. Pneumovax is recommended every 5 years before the age of 78 and once for a lifetime at or after the age of 31. Discussed with the patient-insulin may be the next step of management if hyperglycemia persists Up-to-date on Pneumovax (received from right-sided) which has been updated chart - Glucose (CBG) - HgB A1c  2. Essential hypertension Controlled- losartan-hydrochlorothiazide (HYZAAR) 50-12.5 MG tablet; Take 1 tablet by mouth daily.  Dispense: 30 tablet; Refill: 3  3. Hyperlipemia Advised on low-cholesterol diet - pravastatin (PRAVACHOL) 40 MG tablet; Take 1 tablet (40 mg total) by mouth daily.  Dispense: 30 tablet; Refill: 3       Arnoldo Morale, MD. [facility] [phone] 05/17/2016, 11:24 AM

## 2016-05-17 NOTE — Progress Notes (Signed)
Patient states [place] 50mg  once daily works well would like refill.

## 2016-05-18 MED FILL — JANUVIA 100 MG TABLET: 100 | 30 days supply | Qty: 30 | Fill #0

## 2016-05-26 MED FILL — AMLODIPINE BESYLATE 10 MG T: 10 | 30 days supply | Qty: 30 | Fill #2

## 2016-05-31 MED FILL — LOSARTAN-HCTZ 50-12.5 MG TA: 50-12.5 | 30 days supply | Qty: 30 | Fill #3

## 2016-05-31 MED FILL — DICLOFENAC SOD DR 75 MG TAB: 75 | 60 days supply | Qty: 60 | Fill #1

## 2016-05-31 MED FILL — PRAVASTATIN NA 40 MG TAB: 40 | 30 days supply | Qty: 30 | Fill #3

## 2016-06-07 MED FILL — metFORMIN HCL 1000 MG TABS: 1000 | 30 days supply | Qty: 60 | Fill #2

## 2016-06-07 MED FILL — glipiZIDE XL 10 MG TB24: 10 | 30 days supply | Qty: 60 | Fill #2

## 2016-06-15 MED FILL — JANUVIA 100 MG TABLET: 100 | 30 days supply | Qty: 30 | Fill #1

## 2016-06-16 ENCOUNTER — Ambulatory Visit: Payer: Medicare Other | Admitting: Family Medicine

## 2016-06-26 MED FILL — AMLODIPINE BESYLATE 10 MG T: 10 | 30 days supply | Qty: 30 | Fill #3

## 2016-07-05 ENCOUNTER — Other Ambulatory Visit: Payer: Self-pay | Admitting: Family Medicine

## 2016-07-05 DIAGNOSIS — I10 Essential (primary) hypertension: Secondary | ICD-10-CM

## 2016-07-05 DIAGNOSIS — E785 Hyperlipidemia, unspecified: Secondary | ICD-10-CM

## 2016-07-05 MED FILL — LOSARTAN-HCTZ 50-12.5 MG TA: 50-12.5 | 30 days supply | Qty: 30 | Fill #0

## 2016-07-05 MED FILL — PRAVASTATIN NA 40 MG TAB: 40 | 30 days supply | Qty: 30 | Fill #0

## 2016-07-07 ENCOUNTER — Ambulatory Visit: Payer: Medicare Other | Admitting: Family Medicine

## 2016-07-17 MED FILL — glipiZIDE XL 10 MG TB24: 10 | 30 days supply | Qty: 60 | Fill #3

## 2016-07-17 MED FILL — JANUVIA 100 MG TABLET: 100 | 30 days supply | Qty: 30 | Fill #2

## 2016-07-18 MED FILL — metFORMIN HCL 1000 MG TABS: 1000 | 30 days supply | Qty: 60 | Fill #3

## 2016-07-21 ENCOUNTER — Other Ambulatory Visit: Payer: Self-pay | Admitting: Family Medicine

## 2016-07-21 DIAGNOSIS — M19049 Primary osteoarthritis, unspecified hand: Secondary | ICD-10-CM

## 2016-07-21 MED FILL — ?AMLODIPINE BESYLATE 10 MG: 10 | 30 days supply | Qty: 30 | Fill #0

## 2016-07-21 MED FILL — ?DICLOFENAC SOD DR 75 MG TA: 75 | 30 days supply | Qty: 60 | Fill #0

## 2016-07-31 MED FILL — LOSARTAN-HCTZ 50-12.5 MG TA: 50-12.5 | 30 days supply | Qty: 30 | Fill #1

## 2016-07-31 MED FILL — PRAVASTATIN NA 40 MG TAB: 40 | 30 days supply | Qty: 30 | Fill #1

## 2016-08-01 ENCOUNTER — Ambulatory Visit: Payer: Medicare Other | Admitting: Family Medicine

## 2016-08-17 MED FILL — JANUVIA 100 MG TABLET: 100 | 30 days supply | Qty: 30 | Fill #3

## 2016-08-18 ENCOUNTER — Telehealth: Payer: Self-pay | Admitting: Family Medicine

## 2016-08-18 MED FILL — glipiZIDE XL 10 MG TB24: 10 | 30 days supply | Qty: 60 | Fill #4

## 2016-08-21 ENCOUNTER — Other Ambulatory Visit: Payer: Self-pay | Admitting: Family Medicine

## 2016-08-21 MED FILL — AMLODIPINE BESYLATE 10 MG T: 10 | 30 days supply | Qty: 30 | Fill #1

## 2016-08-21 MED FILL — metFORMIN HCL 1000 MG TABS: 1000 | 30 days supply | Qty: 60 | Fill #0

## 2016-08-28 ENCOUNTER — Other Ambulatory Visit: Payer: Self-pay | Admitting: Family Medicine

## 2016-08-28 DIAGNOSIS — M19049 Primary osteoarthritis, unspecified hand: Secondary | ICD-10-CM

## 2016-08-29 ENCOUNTER — Other Ambulatory Visit: Payer: Self-pay | Admitting: Family Medicine

## 2016-08-29 DIAGNOSIS — M19049 Primary osteoarthritis, unspecified hand: Secondary | ICD-10-CM

## 2016-08-29 MED FILL — PRAVASTATIN NA 40 MG TAB: 40 | 30 days supply | Qty: 30 | Fill #2

## 2016-08-29 MED FILL — LOSARTAN-HCTZ 50-12.5 MG TA: 50-12.5 | 30 days supply | Qty: 30 | Fill #2

## 2016-08-30 ENCOUNTER — Other Ambulatory Visit: Payer: Self-pay | Admitting: Family Medicine

## 2016-08-30 DIAGNOSIS — M19049 Primary osteoarthritis, unspecified hand: Secondary | ICD-10-CM

## 2016-08-30 MED FILL — ?DICLOFENAC SOD DR 75 MG TA: 75 | 30 days supply | Qty: 60 | Fill #0

## 2016-09-05 ENCOUNTER — Encounter: Payer: Self-pay | Admitting: Gastroenterology

## 2016-09-05 ENCOUNTER — Ambulatory Visit: Payer: Medicare Other | Attending: Family Medicine | Admitting: Family Medicine

## 2016-09-05 ENCOUNTER — Encounter: Payer: Self-pay | Admitting: Family Medicine

## 2016-09-05 VITALS — BP 126/74 | HR 76 | Temp 98.1°F | Ht 64.0 in | Wt 148.2 lb

## 2016-09-05 DIAGNOSIS — M19049 Primary osteoarthritis, unspecified hand: Secondary | ICD-10-CM

## 2016-09-05 DIAGNOSIS — Z9111 Patient's noncompliance with dietary regimen: Secondary | ICD-10-CM | POA: Insufficient documentation

## 2016-09-05 DIAGNOSIS — Z1211 Encounter for screening for malignant neoplasm of colon: Secondary | ICD-10-CM

## 2016-09-05 DIAGNOSIS — E7800 Pure hypercholesterolemia, unspecified: Secondary | ICD-10-CM | POA: Diagnosis not present

## 2016-09-05 DIAGNOSIS — Z79899 Other long term (current) drug therapy: Secondary | ICD-10-CM | POA: Diagnosis not present

## 2016-09-05 DIAGNOSIS — Z7984 Long term (current) use of oral hypoglycemic drugs: Secondary | ICD-10-CM | POA: Diagnosis not present

## 2016-09-05 DIAGNOSIS — E119 Type 2 diabetes mellitus without complications: Secondary | ICD-10-CM

## 2016-09-05 DIAGNOSIS — Z7982 Long term (current) use of aspirin: Secondary | ICD-10-CM | POA: Diagnosis not present

## 2016-09-05 DIAGNOSIS — I10 Essential (primary) hypertension: Secondary | ICD-10-CM | POA: Diagnosis not present

## 2016-09-05 LAB — COMPLETE METABOLIC PANEL WITH GFR
ALT: 23 U/L (ref 9–46)
AST: 22 U/L (ref 10–35)
Albumin: 4.5 g/dL (ref 3.6–5.1)
Alkaline Phosphatase: 78 U/L (ref 40–115)
BUN: 14 mg/dL (ref 7–25)
CO2: 25 mmol/L (ref 20–31)
Calcium: 9.6 mg/dL (ref 8.6–10.3)
Chloride: 103 mmol/L (ref 98–110)
Creat: 0.9 mg/dL (ref 0.70–1.25)
GFR, Est African American: >89 mL/min (ref >=60)
GFR, Est Non African American: 88 mL/min (ref >=60)
Glucose, Bld: 168 mg/dL — ABNORMAL HIGH (ref 65–99)
Potassium: 4.2 mmol/L (ref 3.5–5.3)
Sodium: 136 mmol/L (ref 135–146)
Total Bilirubin: 0.5 mg/dL (ref 0.2–1.2)
Total Protein: 7.6 g/dL (ref 6.1–8.1)

## 2016-09-05 LAB — POCT GLYCOSYLATED HEMOGLOBIN (HGB A1C): Hemoglobin A1C: 8.6

## 2016-09-05 LAB — GLUCOSE, POCT (MANUAL RESULT ENTRY): POC Glucose: 166 mg/dl — AB (ref 70–99)

## 2016-09-05 MED ORDER — PRAVASTATIN SODIUM 40 MG PO TABS: 40.0000 mg | ORAL_TABLET | Freq: Every day | ORAL | 5 refills | Status: DC

## 2016-09-05 MED ORDER — AMLODIPINE BESYLATE 10 MG PO TABS: 10.0000 mg | ORAL_TABLET | Freq: Every day | ORAL | 5 refills | Status: DC

## 2016-09-05 MED ORDER — DICLOFENAC SODIUM 75 MG PO TBEC: DELAYED_RELEASE_TABLET | ORAL | 1 refills | Status: DC

## 2016-09-05 MED ORDER — LOSARTAN POTASSIUM-HCTZ 50-12.5 MG PO TABS: 1.0000 | ORAL_TABLET | Freq: Every day | ORAL | 5 refills | Status: DC

## 2016-09-05 MED ORDER — METFORMIN HCL 1000 MG PO TABS: ORAL_TABLET | ORAL | 5 refills | Status: DC

## 2016-09-05 MED ORDER — SITAGLIPTIN PHOSPHATE 100 MG PO TABS: 100.0000 mg | ORAL_TABLET | Freq: Every day | ORAL | 5 refills | Status: DC

## 2016-09-05 MED ORDER — GLIPIZIDE ER 10 MG PO TB24: 20.0000 mg | ORAL_TABLET | Freq: Every day | ORAL | 5 refills | Status: DC

## 2016-09-05 MED FILL — DICLOFENAC SOD DR 75 MG TAB: 75 | 30 days supply | Qty: 60 | Fill #0

## 2016-09-05 NOTE — Progress Notes (Signed)
Subjective:  Patient ID: Jerry Serrano, male    DOB: 1950-07-16  Age: 66 y.o. MRN: [id number]  CC: Diabetes and Hypertension   HPI Jerry Serrano is a 66 year old male with history of hypertension, hyperlipidemia, type 2 diabetes mellitus (A1c 8.4 from today) who presents today for follow-up visit.  He endorses compliance with his medications but has been noncompliant with a diabetic diet. Endorses ingesting foods high in starches and less of vegetables and fruits He states he gets exercise at his job at the [employer]. Denies numbness in extremities, hypoglycemia and his last eye exam was in 04/2016.  He tolerates his antihypertensives been no side effects and also tolerates his statins and denies any myalgias.  Denies chest pains, shortness of breath and has no complaints today.  Past Medical History:  Diagnosis Date  . Diabetes mellitus   . Hypertension     History reviewed. No pertinent surgical history.  No Known Allergies  Outpatient Medications Prior to Visit  Medication Sig Dispense Refill  . aspirin EC 81 MG tablet Take 1 tablet (81 mg total) by mouth daily. 30 tablet 3  . Blood Glucose Monitoring Suppl (ACCU-CHEK AVIVA PLUS) w/Device KIT 1 each by Does not apply route 4 (four) times daily - after meals and at bedtime. 1 kit 0  . glucose blood test strip Use as instructed 100 each 12  . Lancet Devices (ACCU-CHEK SOFTCLIX) lancets Use as instructed 1 each 12  . MICROLET LANCETS MISC Test 3 times per day 100 each 12  . TRUEPLUS LANCETS 28G MISC 1 Package by Does not apply route as directed. 100 each 12  . amLODipine (NORVASC) 10 MG tablet TAKE 1 TABLET BY MOUTH DAILY. 30 tablet 3  . diclofenac (VOLTAREN) 75 MG EC tablet TAKE 1 TABLET BY MOUTH 2 TIMES DAILY AS NEEDED FOR MODERATE PAIN. 60 tablet 0  . glipiZIDE (GLIPIZIDE XL) 10 MG 24 hr tablet Take 1 tablet (10 mg total) by mouth 2 (two) times daily. 60 tablet 4  . losartan-hydrochlorothiazide (HYZAAR)  50-12.5 MG tablet TAKE 1 TABLET BY MOUTH DAILY. 30 tablet 3  . metFORMIN (GLUCOPHAGE) 1000 MG tablet TAKE 1 TABLET BY MOUTH 2 TIMES DAILY WITH A MEAL. 60 tablet 3  . pravastatin (PRAVACHOL) 40 MG tablet TAKE 1 TABLET BY MOUTH DAILY. 30 tablet 3  . sitaGLIPtin (JANUVIA) 100 MG tablet Take 1 tablet (100 mg total) by mouth daily. 30 tablet 5   No facility-administered medications prior to visit.     ROS Review of Systems  Constitutional: Negative for activity change and appetite change.  HENT: Negative for sinus pressure and sore throat.   Eyes: Negative for visual disturbance.  Respiratory: Negative for cough, chest tightness and shortness of breath.   Cardiovascular: Negative for chest pain and leg swelling.  Gastrointestinal: Negative for abdominal distention, abdominal pain, constipation and diarrhea.  Endocrine: Negative.   Genitourinary: Negative for dysuria.  Musculoskeletal: Negative for joint swelling and myalgias.  Skin: Negative for rash.  Allergic/Immunologic: Negative.   Neurological: Negative for weakness, light-headedness and numbness.  Psychiatric/Behavioral: Negative for dysphoric mood and suicidal ideas.    Objective:  BP 126/74 (BP Location: Right Arm, Patient Position: Sitting, Cuff Size: Small)   Pulse 76   Temp 98.1 F (36.7 C) (Oral)   Ht '5\' 4"'$  (1.626 m)   Wt 148 lb 3.2 oz (67.2 kg)   SpO2 98%   BMI 25.44 kg/m   BP/Weight 09/05/2016 05/17/2016 7/49/4496  Systolic BP 759 163 846  Diastolic BP 74 80 77  Wt. (Lbs) 148.2 143 147.2  BMI 25.44 24.55 25.27      Physical Exam  Constitutional: He is oriented to person, place, and time. He appears well-developed and well-nourished.  HENT:  Right Ear: External ear normal.  Left Ear: External ear normal.  Neck: Normal range of motion.  Cardiovascular: Normal rate, normal heart sounds and intact distal pulses.   No murmur heard. Pulmonary/Chest: Effort normal and breath sounds normal. He has no wheezes. He  has no rales. He exhibits no tenderness.  Abdominal: Soft. Bowel sounds are normal. He exhibits no distension and no mass. There is no tenderness.  Musculoskeletal: Normal range of motion.  Neurological: He is alert and oriented to person, place, and time.  Skin: Skin is warm and dry.  Psychiatric: He has a normal mood and affect.     Lab Results  Component Value Date   HGBA1C 8.6 09/05/2016    Assessment & Plan:   1. Screening for colon cancer - Ambulatory referral to Gastroenterology  2. Arthritis pain, hand - diclofenac (VOLTAREN) 75 MG EC tablet; TAKE 1 TABLET BY MOUTH 2 TIMES DAILY AS NEEDED FOR MODERATE PAIN.  Dispense: 60 tablet; Refill: 1  3. Essential hypertension Controlled Low-sodium diet - amLODipine (NORVASC) 10 MG tablet; Take 1 tablet (10 mg total) by mouth daily.  Dispense: 30 tablet; Refill: 5 - losartan-hydrochlorothiazide (HYZAAR) 50-12.5 MG tablet; Take 1 tablet by mouth daily.  Dispense: 30 tablet; Refill: 5  4. Pure hypercholesterolemia Controlled Low-cholesterol diet - pravastatin (PRAVACHOL) 40 MG tablet; Take 1 tablet (40 mg total) by mouth daily.  Dispense: 30 tablet; Refill: 5  5. Type 2 diabetes mellitus without complication, without long-term current use of insulin (HCC) Improved with A1c of 8.6 which has trended down from 9.9 previously Less than goal of 7.0 Poor control likely due to intake of high, will hydrate foods Discussed my plate method, diabetic diet He will work on reducing carbs Increase metformin from total daily dose of 2002 maximum dose of 2500 We'll consider addition of insulin next visit if still uncontrolled Up-to-date on eye exam, foot exam. - Glucose (CBG) - HgB A1c - Microalbumin / creatinine urine ratio - COMPLETE METABOLIC PANEL WITH GFR - metFORMIN (GLUCOPHAGE) 1000 MG tablet; TAKE 1.5 TABLET ('1500MG'$ ) BY MOUTH IN THE MORNING AND 1 TABLET ('1000MG'$ ) IN THE EVENING.  Dispense: 75 tablet; Refill: 5 - glipiZIDE (GLIPIZIDE  XL) 10 MG 24 hr tablet; Take 2 tablets (20 mg total) by mouth daily with breakfast.  Dispense: 60 tablet; Refill: 5 - sitaGLIPtin (JANUVIA) 100 MG tablet; Take 1 tablet (100 mg total) by mouth daily.  Dispense: 30 tablet; Refill: 5   Meds ordered this encounter  Medications  . amLODipine (NORVASC) 10 MG tablet    Sig: Take 1 tablet (10 mg total) by mouth daily.    Dispense:  30 tablet    Refill:  5  . metFORMIN (GLUCOPHAGE) 1000 MG tablet    Sig: TAKE 1.5 TABLET ('1500MG'$ ) BY MOUTH IN THE MORNING AND 1 TABLET ('1000MG'$ ) IN THE EVENING.    Dispense:  75 tablet    Refill:  5  . diclofenac (VOLTAREN) 75 MG EC tablet    Sig: TAKE 1 TABLET BY MOUTH 2 TIMES DAILY AS NEEDED FOR MODERATE PAIN.    Dispense:  60 tablet    Refill:  1  . losartan-hydrochlorothiazide (HYZAAR) 50-12.5 MG tablet    Sig: Take 1 tablet by mouth daily.  Dispense:  30 tablet    Refill:  5  . pravastatin (PRAVACHOL) 40 MG tablet    Sig: Take 1 tablet (40 mg total) by mouth daily.    Dispense:  30 tablet    Refill:  5  . glipiZIDE (GLIPIZIDE XL) 10 MG 24 hr tablet    Sig: Take 2 tablets (20 mg total) by mouth daily with breakfast.    Dispense:  60 tablet    Refill:  5  . sitaGLIPtin (JANUVIA) 100 MG tablet    Sig: Take 1 tablet (100 mg total) by mouth daily.    Dispense:  30 tablet    Refill:  5    Discontinue Invokana and Onglyza    Follow-up: Return in about 3 months (around 12/03/2016) for Follow-up of diabetes mellitus.   Arnoldo Morale MD

## 2016-09-05 NOTE — Patient Instructions (Signed)
Diabetes Mellitus and Food It is important for you to manage your blood sugar (glucose) level. Your blood glucose level can be greatly affected by what you eat. Eating healthier foods in the appropriate amounts throughout the day at about the same time each day will help you control your blood glucose level. It can also help slow or prevent worsening of your diabetes mellitus. Healthy eating may even help you improve the level of your blood pressure and reach or maintain a healthy weight. General recommendations for healthful eating and cooking habits include:  Eating meals and snacks regularly. Avoid going long periods of time without eating to lose weight.  Eating a diet that consists mainly of plant-based foods, such as fruits, vegetables, nuts, legumes, and whole grains.  Using low-heat cooking methods, such as baking, instead of high-heat cooking methods, such as deep frying.  Work with your dietitian to make sure you understand how to use the Nutrition Facts information on food labels. How can food affect me? Carbohydrates Carbohydrates affect your blood glucose level more than any other type of food. Your dietitian will help you determine how many carbohydrates to eat at each meal and teach you how to count carbohydrates. Counting carbohydrates is important to keep your blood glucose at a healthy level, especially if you are using insulin or taking certain medicines for diabetes mellitus. Alcohol Alcohol can cause sudden decreases in blood glucose (hypoglycemia), especially if you use insulin or take certain medicines for diabetes mellitus. Hypoglycemia can be a life-threatening condition. Symptoms of hypoglycemia (sleepiness, dizziness, and disorientation) are similar to symptoms of having too much alcohol. If your health care provider has given you approval to drink alcohol, do so in moderation and use the following guidelines:  Women should not have more than one drink per day, and men  should not have more than two drinks per day. One drink is equal to: ? 12 oz of beer. ? 5 oz of wine. ? 1 oz of hard liquor.  Do not drink on an empty stomach.  Keep yourself hydrated. Have water, diet soda, or unsweetened iced tea.  Regular soda, juice, and other mixers might contain a lot of carbohydrates and should be counted.  What foods are not recommended? As you make food choices, it is important to remember that all foods are not the same. Some foods have fewer nutrients per serving than other foods, even though they might have the same number of calories or carbohydrates. It is difficult to get your body what it needs when you eat foods with fewer nutrients. Examples of foods that you should avoid that are high in calories and carbohydrates but low in nutrients include:  Trans fats (most processed foods list trans fats on the Nutrition Facts label).  Regular soda.  Juice.  Candy.  Sweets, such as cake, pie, doughnuts, and cookies.  Fried foods.  What foods can I eat? Eat nutrient-rich foods, which will nourish your body and keep you healthy. The food you should eat also will depend on several factors, including:  The calories you need.  The medicines you take.  Your weight.  Your blood glucose level.  Your blood pressure level.  Your cholesterol level.  You should eat a variety of foods, including:  Protein. ? Lean cuts of meat. ? Proteins low in saturated fats, such as fish, egg whites, and beans. Avoid processed meats.  Fruits and vegetables. ? Fruits and vegetables that may help control blood glucose levels, such as apples,   mangoes, and yams.  Dairy products. ? Choose fat-free or low-fat dairy products, such as milk, yogurt, and cheese.  Grains, bread, pasta, and rice. ? Choose whole grain products, such as multigrain bread, whole oats, and brown rice. These foods may help control blood pressure.  Fats. ? Foods containing healthful fats, such as  nuts, avocado, olive oil, canola oil, and fish.  Does everyone with diabetes mellitus have the same meal plan? Because every person with diabetes mellitus is different, there is not one meal plan that works for everyone. It is very important that you meet with a dietitian who will help you create a meal plan that is just right for you. This information is not intended to replace advice given to you by your health care provider. Make sure you discuss any questions you have with your health care provider. Document Released: 03/23/2005 Document Revised: 12/02/2015 Document Reviewed: 05/23/2013 Elsevier Interactive Patient Education  2017 [employer]  

## 2016-09-05 NOTE — Progress Notes (Signed)
Needs refills on all meds 

## 2016-09-06 LAB — MICROALBUMIN / CREATININE URINE RATIO
Creatinine, Urine: 378 mg/dL — ABNORMAL HIGH (ref 20–370)
Microalb Creat Ratio: 5 mcg/mg creat (ref <30)
Microalb, Ur: 2 mg/dL

## 2016-09-07 ENCOUNTER — Telehealth: Payer: Self-pay

## 2016-09-07 NOTE — Telephone Encounter (Signed)
Writer called patient and discussed labs.  Patient stated understanding of those results.

## 2016-09-07 NOTE — Telephone Encounter (Signed)
-----   Message from Arnoldo Morale, MD sent at 09/06/2016 10:05 AM EST ----- Please inform the patient that labs are normal. Thank you.

## 2016-09-11 MED FILL — JANUVIA 100 MG TABLET: 100 | 30 days supply | Qty: 30 | Fill #0

## 2016-09-12 MED FILL — metFORMIN HCL 1000 MG TABS: 1000 | 30 days supply | Qty: 75 | Fill #0
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
## 2016-09-18 ENCOUNTER — Other Ambulatory Visit: Payer: Self-pay | Admitting: Family Medicine

## 2016-09-22 ENCOUNTER — Other Ambulatory Visit: Payer: Self-pay | Admitting: Family Medicine

## 2016-09-25 LAB — IFOBT (OCCULT BLOOD): IFOBT: NEGATIVE

## 2016-09-25 MED FILL — AMLODIPINE BESYLATE 10 MG T: 10 | 30 days supply | Qty: 30 | Fill #2

## 2016-09-26 MED FILL — glipiZIDE XL 10 MG TB24: 10 | 30 days supply | Qty: 60 | Fill #0

## 2016-09-27 ENCOUNTER — Other Ambulatory Visit: Payer: Self-pay

## 2016-09-27 ENCOUNTER — Other Ambulatory Visit: Payer: Self-pay | Admitting: Family Medicine

## 2016-09-27 MED ORDER — BETAMETHASONE DIPROPIONATE AUG 0.05 % EX CREA: TOPICAL_CREAM | Freq: Two times a day (BID) | CUTANEOUS | 0 refills | Status: DC

## 2016-09-29 ENCOUNTER — Telehealth: Payer: Self-pay | Admitting: Family Medicine

## 2016-09-29 MED ORDER — BETAMETHASONE DIPROPIONATE AUG 0.05 % EX CREA: TOPICAL_CREAM | Freq: Two times a day (BID) | CUTANEOUS | 0 refills | Status: DC

## 2016-09-29 NOTE — Telephone Encounter (Signed)
Pt. Came to facility stating that he was prescribed augmented betamethasone dipropionate (DIPROLENE-AF) 0.05 % cream, and when he went to [facility] pharmacy he was told that they did not have it. Could the Rx be sent again. Please f/u

## 2016-10-02 MED FILL — LOSARTAN-HCTZ 50-12.5 MG TA: 50-12.5 | 30 days supply | Qty: 30 | Fill #3

## 2016-10-02 MED FILL — PRAVASTATIN NA 40 MG TAB: 40 | 30 days supply | Qty: 30 | Fill #3

## 2016-10-18 ENCOUNTER — Encounter: Payer: Self-pay | Admitting: Gastroenterology

## 2016-10-18 ENCOUNTER — Ambulatory Visit (AMBULATORY_SURGERY_CENTER): Payer: Self-pay

## 2016-10-18 VITALS — Ht 64.0 in | Wt 149.2 lb

## 2016-10-18 DIAGNOSIS — Z1211 Encounter for screening for malignant neoplasm of colon: Secondary | ICD-10-CM

## 2016-10-18 MED ORDER — NA SULFATE-K SULFATE-MG SULF 17.5-3.13-1.6 GM/177ML PO SOLN: 1.0000 | Freq: Once | ORAL | 0 refills | Status: AC

## 2016-10-18 MED FILL — JANUVIA 100 MG TABLET: 100 | 30 days supply | Qty: 30 | Fill #4

## 2016-10-18 NOTE — Progress Notes (Signed)
Denies allergies to eggs or soy products. Denies complication of anesthesia or sedation. Denies use of weight loss medication. Denies use of O2.   Emmi instructions declined, does not have email.

## 2016-10-23 MED FILL — AMLODIPINE BESYLATE 10 MG T: 10 | 30 days supply | Qty: 30 | Fill #3

## 2016-10-26 MED FILL — glipiZIDE ER 10 MG TB24: 10 | 30 days supply | Qty: 60 | Fill #1

## 2016-10-30 MED FILL — DICLOFENAC SOD DR 75 MG TAB: 75 | 30 days supply | Qty: 60 | Fill #1

## 2016-10-30 MED FILL — PRAVASTATIN NA 40 MG TAB: 40 | 30 days supply | Qty: 30 | Fill #0

## 2016-10-31 MED FILL — LOSARTAN-HCTZ 50-12.5 MG TA: 50-12.5 | 30 days supply | Qty: 30 | Fill #0

## 2016-11-01 ENCOUNTER — Encounter: Payer: Self-pay | Admitting: Gastroenterology

## 2016-11-01 ENCOUNTER — Ambulatory Visit (AMBULATORY_SURGERY_CENTER): Payer: Medicare Other | Admitting: Gastroenterology

## 2016-11-01 VITALS — BP 128/61 | HR 88 | Temp 96.8°F | Resp 18 | Ht 64.0 in | Wt 149.0 lb

## 2016-11-01 DIAGNOSIS — D128 Benign neoplasm of rectum: Secondary | ICD-10-CM

## 2016-11-01 DIAGNOSIS — Z1211 Encounter for screening for malignant neoplasm of colon: Secondary | ICD-10-CM

## 2016-11-01 DIAGNOSIS — Z1212 Encounter for screening for malignant neoplasm of rectum: Secondary | ICD-10-CM | POA: Diagnosis not present

## 2016-11-01 DIAGNOSIS — K623 Rectal prolapse: Secondary | ICD-10-CM | POA: Diagnosis not present

## 2016-11-01 MED ORDER — SODIUM CHLORIDE 0.9 % IV SOLN: 500.0000 mL | INTRAVENOUS | Status: AC

## 2016-11-01 MED FILL — metFORMIN HCL 1000 MG TABS: 1000 | 30 days supply | Qty: 75 | Fill #1

## 2016-11-01 NOTE — Op Note (Signed)
[place] Patient Name: Jerry Serrano Procedure Date: 11/01/2016 11:23 AM MRN: [id number] Endoscopist: Cuy[place]. Loletha Carrow , MD Age: 66 Referring MD:  Date of Birth: [birth year] Gender: Male Account #: [id number] Procedure:                Colonoscopy Indications:              Screening for colorectal malignant neoplasm, This                            is the patient's first colonoscopy Medicines:                Monitored Anesthesia Care Procedure:                Pre-Anesthesia Assessment:                           - Prior to the procedure, a History and Physical                            was performed, and patient medications and                            allergies were reviewed. The patient's tolerance of                            previous anesthesia was also reviewed. The risks                            and benefits of the procedure and the sedation                            options and risks were discussed with the patient.                            All questions were answered, and informed consent                            was obtained. Anticoagulants: The patient has taken                            aspirin. It was decided not to withhold this                            medication prior to the procedure. ASA Grade                            Assessment: III - A patient with severe systemic                            disease. After reviewing the risks and benefits,                            the patient was deemed in satisfactory condition to  undergo the procedure.                           After obtaining informed consent, the colonoscope                            was passed under direct vision. Throughout the                            procedure, the patient's blood pressure, pulse, and                            oxygen saturations were monitored continuously. The                            Colonoscope was introduced through the anus  and                            advanced to the the cecum, identified by                            appendiceal orifice and ileocecal valve. The                            colonoscopy was performed with moderate difficulty                            due to multiple diverticula in the colon and a                            tortuous colon. Successful completion of the                            procedure was aided by changing the patient to a                            supine position. The patient tolerated the                            procedure well. The quality of the bowel                            preparation was good. The ileocecal valve,                            appendiceal orifice, and rectum were photographed.                            The quality of the bowel preparation was evaluated                            using the BBPS [facility] Bowel Preparation Scale)  with scores of: Right Colon = 2, Transverse Colon =                            2 and Left Colon = 2. The total BBPS score equals                            6. The bowel preparation used was SUPREP. Scope In: 11:35:20 AM Scope Out: 11:53:39 AM Scope Withdrawal Time: 0 hours 10 minutes 39 seconds  Total Procedure Duration: 0 hours 18 minutes 19 seconds  Findings:                 The perianal and digital rectal examinations were                            normal.                           Many small and large-mouthed diverticula were found                            in the entire colon.                           A 6 mm polyp was found in the rectum. The polyp was                            semi-pedunculated. The polyp was removed with a hot                            snare. Resection and retrieval were complete.                           The exam was otherwise without abnormality on                            direct and retroflexion views. Complications:            No immediate  complications. Estimated Blood Loss:     Estimated blood loss: none. Impression:               - Diverticulosis in the entire examined colon.                           - One 6 mm polyp in the rectum, removed with a hot                            snare. Resected and retrieved.                           - The examination was otherwise normal on direct                            and retroflexion views. Recommendation:           - Patient has a contact number available for  emergencies. The signs and symptoms of potential                            delayed complications were discussed with the                            patient. Return to normal activities tomorrow.                            Written discharge instructions were provided to the                            patient.                           - Resume previous diet.                           - No aspirin, ibuprofen, naproxen, or other                            non-steroidal anti-inflammatory drugs for 7 days                            after polyp removal.                           - Await pathology results.                           - Repeat colonoscopy is recommended for                            surveillance. The colonoscopy date will be                            determined after pathology results from today's                            exam become available for review. Henry L. Loletha Carrow, MD 11/01/2016 12:00:15 PM This report has been signed electronically.

## 2016-11-01 NOTE — Progress Notes (Signed)
Called to room to assist during endoscopic procedure.  Patient ID and intended procedure confirmed with present staff. Received instructions for my participation in the procedure from the performing physician.  

## 2016-11-01 NOTE — Patient Instructions (Signed)
Diverticulosis and 1 polyp found and removed.  Await pathology for final recommendations.   No aspirin, ibuprofen, naproxen or other non-steroidal anti-inflammatory drugs for 7 days.  ( 11/08/2016)    YOU HAD AN ENDOSCOPIC PROCEDURE TODAY AT [facility]:   Refer to the procedure report that was given to you for any specific questions about what was found during the examination.  If the procedure report does not answer your questions, please call your gastroenterologist to clarify.  If you requested that your care partner not be given the details of your procedure findings, then the procedure report has been included in a sealed envelope for you to review at your convenience later.  YOU SHOULD EXPECT: Some feelings of bloating in the abdomen. Passage of more gas than usual.  Walking can help get rid of the air that was put into your GI tract during the procedure and reduce the bloating. If you had a lower endoscopy (such as a colonoscopy or flexible sigmoidoscopy) you may notice spotting of blood in your stool or on the toilet paper. If you underwent a bowel prep for your procedure, you may not have a normal bowel movement for a few days.  Please Note:  You might notice some irritation and congestion in your nose or some drainage.  This is from the oxygen used during your procedure.  There is no need for concern and it should clear up in a day or so.  SYMPTOMS TO REPORT IMMEDIATELY:   Following lower endoscopy (colonoscopy or flexible sigmoidoscopy):  Excessive amounts of blood in the stool  Significant tenderness or worsening of abdominal pains  Swelling of the abdomen that is new, acute  Fever of 100F or higher   For urgent or emergent issues, a gastroenterologist can be reached at any hour by calling [phone].   DIET:  We do recommend a small meal at first, but then you may proceed to your regular diet.  Drink plenty of fluids but you should avoid alcoholic beverages  for 24 hours.  ACTIVITY:  You should plan to take it easy for the rest of today and you should NOT DRIVE or use heavy machinery until tomorrow (because of the sedation medicines used during the test).    FOLLOW UP: Our staff will call the number listed on your records the next business day following your procedure to check on you and address any questions or concerns that you may have regarding the information given to you following your procedure. If we do not reach you, we will leave a message.  However, if you are feeling well and you are not experiencing any problems, there is no need to return our call.  We will assume that you have returned to your regular daily activities without incident.  If any biopsies were taken you will be contacted by phone or by letter within the next 1-3 weeks.  Please call us at [phone] if you have not heard about the biopsies in 3 weeks.    SIGNATURES/CONFIDENTIALITY: You and/or your care partner have signed paperwork which will be entered into your electronic medical record.  These signatures attest to the fact that that the information above on your After Visit Summary has been reviewed and is understood.  Full responsibility of the confidentiality of this discharge information lies with you and/or your care-partner.

## 2016-11-01 NOTE — Progress Notes (Signed)
A and O x3. Report to RN. Tolerated MAC anesthesia well.

## 2016-11-02 ENCOUNTER — Telehealth: Payer: Self-pay | Admitting: *Deleted

## 2016-11-02 NOTE — Telephone Encounter (Signed)
  Follow up Call-  Call back number 11/01/2016  Post procedure Call Back phone  # [phone]  Permission to leave phone message Yes  Some recent data might be hidden     No answer at # given.  Left message on VM.

## 2016-11-02 NOTE — Telephone Encounter (Signed)
  Follow up Call-  Call back number 11/01/2016  Post procedure Call Back phone  # [phone]  Permission to leave phone message Yes  Some recent data might be hidden     Patient questions:  Left message to call if necessary.

## 2016-11-06 ENCOUNTER — Encounter: Payer: Self-pay | Admitting: Gastroenterology

## 2016-11-21 ENCOUNTER — Other Ambulatory Visit: Payer: Self-pay | Admitting: Family Medicine

## 2016-11-21 DIAGNOSIS — E119 Type 2 diabetes mellitus without complications: Secondary | ICD-10-CM

## 2016-11-21 MED FILL — JANUVIA 100 MG TABLET: 100 | 30 days supply | Qty: 30 | Fill #0

## 2016-11-24 ENCOUNTER — Other Ambulatory Visit: Payer: Self-pay | Admitting: Family Medicine

## 2016-11-27 MED FILL — glipiZIDE ER 10 MG TB24: 10 | 30 days supply | Qty: 60 | Fill #2

## 2016-11-27 MED FILL — AMLODIPINE BESYLATE 10 MG T: 10 | 30 days supply | Qty: 30 | Fill #0

## 2016-11-27 MED FILL — PRAVASTATIN NA 40 MG TAB: 40 | 30 days supply | Qty: 30 | Fill #1

## 2016-11-28 ENCOUNTER — Other Ambulatory Visit: Payer: Self-pay | Admitting: Internal Medicine

## 2016-11-28 DIAGNOSIS — I10 Essential (primary) hypertension: Secondary | ICD-10-CM

## 2016-11-28 MED FILL — LOSARTAN-HCTZ 50-12.5 MG TA: 50-12.5 | 30 days supply | Qty: 30 | Fill #0

## 2016-12-01 ENCOUNTER — Other Ambulatory Visit: Payer: Self-pay | Admitting: Family Medicine

## 2016-12-01 DIAGNOSIS — M19049 Primary osteoarthritis, unspecified hand: Secondary | ICD-10-CM

## 2016-12-05 ENCOUNTER — Ambulatory Visit: Payer: Medicare Other | Attending: Family Medicine | Admitting: Family Medicine

## 2016-12-05 ENCOUNTER — Encounter: Payer: Self-pay | Admitting: Family Medicine

## 2016-12-05 VITALS — BP 96/62 | HR 84 | Temp 98.3°F | Resp 18 | Ht 64.0 in | Wt 145.4 lb

## 2016-12-05 DIAGNOSIS — Z7982 Long term (current) use of aspirin: Secondary | ICD-10-CM | POA: Insufficient documentation

## 2016-12-05 DIAGNOSIS — Z79899 Other long term (current) drug therapy: Secondary | ICD-10-CM | POA: Insufficient documentation

## 2016-12-05 DIAGNOSIS — M19049 Primary osteoarthritis, unspecified hand: Secondary | ICD-10-CM

## 2016-12-05 DIAGNOSIS — E119 Type 2 diabetes mellitus without complications: Secondary | ICD-10-CM | POA: Diagnosis not present

## 2016-12-05 DIAGNOSIS — M13849 Other specified arthritis, unspecified hand: Secondary | ICD-10-CM | POA: Diagnosis not present

## 2016-12-05 DIAGNOSIS — K089 Disorder of teeth and supporting structures, unspecified: Secondary | ICD-10-CM | POA: Insufficient documentation

## 2016-12-05 DIAGNOSIS — E7800 Pure hypercholesterolemia, unspecified: Secondary | ICD-10-CM | POA: Insufficient documentation

## 2016-12-05 DIAGNOSIS — Z7984 Long term (current) use of oral hypoglycemic drugs: Secondary | ICD-10-CM | POA: Insufficient documentation

## 2016-12-05 DIAGNOSIS — I10 Essential (primary) hypertension: Secondary | ICD-10-CM | POA: Insufficient documentation

## 2016-12-05 LAB — POCT GLYCOSYLATED HEMOGLOBIN (HGB A1C): Hemoglobin A1C: 7.8

## 2016-12-05 LAB — GLUCOSE, POCT (MANUAL RESULT ENTRY): POC Glucose: 124 mg/dl — AB (ref 70–99)

## 2016-12-05 MED ORDER — METFORMIN HCL 1000 MG PO TABS: ORAL_TABLET | ORAL | 5 refills | Status: DC

## 2016-12-05 MED ORDER — PRAVASTATIN SODIUM 40 MG PO TABS: 40.0000 mg | ORAL_TABLET | Freq: Every day | ORAL | 5 refills | Status: DC

## 2016-12-05 MED ORDER — SITAGLIPTIN PHOSPHATE 100 MG PO TABS: 100.0000 mg | ORAL_TABLET | Freq: Every day | ORAL | 5 refills | Status: DC

## 2016-12-05 MED ORDER — LOSARTAN POTASSIUM-HCTZ 50-12.5 MG PO TABS: 1.0000 | ORAL_TABLET | Freq: Every day | ORAL | 5 refills | Status: DC

## 2016-12-05 MED ORDER — AMLODIPINE BESYLATE 10 MG PO TABS: 10.0000 mg | ORAL_TABLET | Freq: Every day | ORAL | 5 refills | Status: DC

## 2016-12-05 MED ORDER — GLIPIZIDE ER 10 MG PO TB24: 20.0000 mg | ORAL_TABLET | Freq: Every day | ORAL | 5 refills | Status: DC

## 2016-12-05 MED ORDER — DICLOFENAC SODIUM 75 MG PO TBEC: DELAYED_RELEASE_TABLET | ORAL | 3 refills | Status: DC

## 2016-12-05 MED FILL — ?DICLOFENAC SOD DR 75 MG TA: 75 | 30 days supply | Qty: 60 | Fill #0

## 2016-12-05 NOTE — Progress Notes (Signed)
Patient is here for f/up  

## 2016-12-05 NOTE — Progress Notes (Signed)
 Subjective:  Patient ID: Jerry Serrano, male    DOB: 05/21/1951  Age: 65 y.o. MRN: [id number]  CC: Follow-up on diabetes  HPI Jerry Serrano  is a 65-year-old male with history of hypertension, hyperlipidemia, type 2 diabetes mellitus (A1c 7.8 which is down from 8.6) who presents today for follow-up visit.  He endorses compliance with his medications and has been working on cutting back on his starches as he eats a lot of starches and less of vegetables and fruits. He denies neuropathy in his extremities or visual complaints ;he gets exercise at his job at the [employer] where he states he is constantly walking Denies hypoglycemia and his last eye exam was in 04/2016.  He tolerates his antihypertensives been no side effects and also tolerates his statins and denies any myalgias. 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 Allergy   . Arthritis   . Diabetes mellitus   . Hyperlipidemia   . Hypertension     Past Surgical History:  Procedure Laterality Date  . HERNIA REPAIR      No Known Allergies  Outpatient Medications Prior to Visit  Medication Sig Dispense Refill  . aspirin EC 81 MG tablet Take 1 tablet (81 mg total) by mouth daily. 30 tablet 3  . Blood Glucose Monitoring Suppl (ACCU-CHEK AVIVA PLUS) w/Device KIT 1 each by Does not apply route 4 (four) times daily - after meals and at bedtime. 1 kit 0  . glucose blood test strip Use as instructed 100 each 12  . Lancet Devices (ACCU-CHEK SOFTCLIX) lancets Use as instructed 1 each 12  . MICROLET LANCETS MISC Test 3 times per day 100 each 12  . TRUEPLUS LANCETS 28G MISC 1 Package by Does not apply route as directed. 100 each 12  . amLODipine (NORVASC) 10 MG tablet Take 1 tablet (10 mg total) by mouth daily. 30 tablet 5  . diclofenac (VOLTAREN) 75 MG EC tablet TAKE 1 TABLET BY MOUTH  2 TIMES DAILY AS NEEDED FOR MODERATE PAIN. 60 tablet 1  . glipiZIDE (GLIPIZIDE XL) 10 MG 24 hr tablet Take 2 tablets (20 mg total) by mouth daily with breakfast. 60 tablet 5  . JANUVIA 100 MG tablet TAKE 1 TABLET BY MOUTH DAILY. 30 tablet 0  . losartan-hydrochlorothiazide (HYZAAR) 50-12.5 MG tablet Take 1 tablet by mouth daily. 30 tablet 5  . metFORMIN (GLUCOPHAGE) 1000 MG tablet TAKE 1.5 TABLET (1500MG) BY MOUTH IN THE MORNING AND 1 TABLET (1000MG) IN THE EVENING. 75 tablet 5  . pravastatin (PRAVACHOL) 40 MG tablet Take 1 tablet (40 mg total) by mouth daily. 30 tablet 5   Facility-Administered Medications Prior to Visit  Medication Dose Route Frequency Provider Last Rate Last Dose  . 0.9 %  sodium chloride infusion  500 mL Intravenous Continuous Danis, Henry L III, MD        ROS Review of Systems  Constitutional: Negative for activity change and appetite change.  HENT: Negative for sinus pressure and sore throat.   Eyes: Negative for visual disturbance.  Respiratory: Negative for cough, chest tightness and shortness of breath.   Cardiovascular: Negative for chest pain and leg swelling.  Gastrointestinal: Negative for abdominal distention, abdominal pain, constipation and diarrhea.  Endocrine: Negative.   Genitourinary: Negative for dysuria.  Musculoskeletal: Negative for joint swelling and myalgias.  Skin: Negative for   rash.  Allergic/Immunologic: Negative.   Neurological: Negative for weakness, light-headedness and numbness.  Psychiatric/Behavioral: Negative for dysphoric mood and suicidal ideas.    Objective:  BP 96/62 (BP Location: Left Arm, Patient Position: Sitting, Cuff Size: Normal)   Pulse 84   Temp 98.3 F (36.8 C) (Oral)   Resp 18   Ht 5' 4" (1.626 m)   Wt 145 lb 6.4 oz (66 kg)   SpO2 97%   BMI 24.96 kg/m   BP/Weight 12/05/2016 11/01/2016 10/18/2016  Systolic BP 96 128 -  Diastolic BP 62 61 -  Wt. (Lbs) 145.4 149 149.2  BMI 24.96 25.58 25.61      Physical  Exam  Constitutional: He is oriented to person, place, and time. He appears well-developed and well-nourished.  Cardiovascular: Normal rate, normal heart sounds and intact distal pulses.   No murmur heard. Pulmonary/Chest: Effort normal and breath sounds normal. He has no wheezes. He has no rales. He exhibits no tenderness.  Abdominal: Soft. Bowel sounds are normal. He exhibits no distension and no mass. There is no tenderness.  Musculoskeletal: Normal range of motion.  Neurological: He is alert and oriented to person, place, and time.  Skin: Skin is warm and dry.  Psychiatric: He has a normal mood and affect.    CMP Latest Ref Rng & Units 09/05/2016 02/29/2016 04/22/2015  Glucose 65 - 99 mg/dL 168(H) 163(H) 160(H)  BUN 7 - 25 mg/dL 14 21 10  Creatinine 0.70 - 1.25 mg/dL 0.90 0.88 0.94  Sodium 135 - 146 mmol/L 136 135 137  Potassium 3.5 - 5.3 mmol/L 4.2 4.2 4.7  Chloride 98 - 110 mmol/L 103 100 103  CO2 20 - 31 mmol/L 25 26 27  Calcium 8.6 - 10.3 mg/dL 9.6 9.7 9.9  Total Protein 6.1 - 8.1 g/dL 7.6 7.2 -  Total Bilirubin 0.2 - 1.2 mg/dL 0.5 0.7 -  Alkaline Phos 40 - 115 U/L 78 72 -  AST 10 - 35 U/L 22 18 -  ALT 9 - 46 U/L 23 17 -    Lipid Panel     Component Value Date/Time   CHOL 147 02/29/2016 1018   TRIG 58 02/29/2016 1018   HDL 52 02/29/2016 1018   CHOLHDL 2.8 02/29/2016 1018   VLDL 12 02/29/2016 1018   LDLCALC 83 02/29/2016 1018    Lab Results  Component Value Date   HGBA1C 7.8 12/05/2016     Assessment & Plan:   1. Essential hypertension Controlled - amLODipine (NORVASC) 10 MG tablet; Take 1 tablet (10 mg total) by mouth daily.  Dispense: 30 tablet; Refill: 5 - losartan-hydrochlorothiazide (HYZAAR) 50-12.5 MG tablet; Take 1 tablet by mouth daily.  Dispense: 30 tablet; Refill: 5  2. Arthritis pain, hand Stable - diclofenac (VOLTAREN) 75 MG EC tablet; TAKE 1 TABLET BY MOUTH 2 TIMES DAILY AS NEEDED FOR MODERATE PAIN.  Dispense: 60 tablet; Refill: 3  3. Type 2  diabetes mellitus without complication, without long-term current use of insulin (HCC) A1c of 7.8 which has improved from 8.6 previously He has not been taking metformin according to prescribed dose and this has been corrected Continue to work on diabetic diet and exercise and cutting back on starches and sweets. - Glucose (CBG) - HgB A1c - glipiZIDE (GLIPIZIDE XL) 10 MG 24 hr tablet; Take 2 tablets (20 mg total) by mouth daily with breakfast.  Dispense: 60 tablet; Refill: 5 - sitaGLIPtin (JANUVIA) 100 MG tablet; Take 1 tablet (100 mg total) by mouth daily.    Dispense: 30 tablet; Refill: 5 - metFORMIN (GLUCOPHAGE) 1000 MG tablet; TAKE 1.5 TABLET (1500MG) BY MOUTH IN THE MORNING AND 1 TABLET (1000MG) IN THE EVENING.  Dispense: 75 tablet; Refill: 5  4. Pure hypercholesterolemia Stable - pravastatin (PRAVACHOL) 40 MG tablet; Take 1 tablet (40 mg total) by mouth daily.  Dispense: 30 tablet; Refill: 5  5. Dental disorder - Ambulatory referral to Dentistry   Meds ordered this encounter  Medications  . amLODipine (NORVASC) 10 MG tablet    Sig: Take 1 tablet (10 mg total) by mouth daily.    Dispense:  30 tablet    Refill:  5  . diclofenac (VOLTAREN) 75 MG EC tablet    Sig: TAKE 1 TABLET BY MOUTH 2 TIMES DAILY AS NEEDED FOR MODERATE PAIN.    Dispense:  60 tablet    Refill:  3  . glipiZIDE (GLIPIZIDE XL) 10 MG 24 hr tablet    Sig: Take 2 tablets (20 mg total) by mouth daily with breakfast.    Dispense:  60 tablet    Refill:  5  . sitaGLIPtin (JANUVIA) 100 MG tablet    Sig: Take 1 tablet (100 mg total) by mouth daily.    Dispense:  30 tablet    Refill:  5    Must have office visit for refills  . losartan-hydrochlorothiazide (HYZAAR) 50-12.5 MG tablet    Sig: Take 1 tablet by mouth daily.    Dispense:  30 tablet    Refill:  5  . metFORMIN (GLUCOPHAGE) 1000 MG tablet    Sig: TAKE 1.5 TABLET (1500MG) BY MOUTH IN THE MORNING AND 1 TABLET (1000MG) IN THE EVENING.    Dispense:  75 tablet     Refill:  5  . pravastatin (PRAVACHOL) 40 MG tablet    Sig: Take 1 tablet (40 mg total) by mouth daily.    Dispense:  30 tablet    Refill:  5    Follow-up: Return in about 3 months (around 03/07/2017) for Follow-up on diabetes mellitus.   Enobong Amao MD   

## 2016-12-05 NOTE — Patient Instructions (Signed)
Diabetes Mellitus and Food It is important for you to manage your blood sugar (glucose) level. Your blood glucose level can be greatly affected by what you eat. Eating healthier foods in the appropriate amounts throughout the day at about the same time each day will help you control your blood glucose level. It can also help slow or prevent worsening of your diabetes mellitus. Healthy eating may even help you improve the level of your blood pressure and reach or maintain a healthy weight. General recommendations for healthful eating and cooking habits include:  Eating meals and snacks regularly. Avoid going long periods of time without eating to lose weight.  Eating a diet that consists mainly of plant-based foods, such as fruits, vegetables, nuts, legumes, and whole grains.  Using low-heat cooking methods, such as baking, instead of high-heat cooking methods, such as deep frying.  Work with your dietitian to make sure you understand how to use the Nutrition Facts information on food labels. How can food affect me? Carbohydrates Carbohydrates affect your blood glucose level more than any other type of food. Your dietitian will help you determine how many carbohydrates to eat at each meal and teach you how to count carbohydrates. Counting carbohydrates is important to keep your blood glucose at a healthy level, especially if you are using insulin or taking certain medicines for diabetes mellitus. Alcohol Alcohol can cause sudden decreases in blood glucose (hypoglycemia), especially if you use insulin or take certain medicines for diabetes mellitus. Hypoglycemia can be a life-threatening condition. Symptoms of hypoglycemia (sleepiness, dizziness, and disorientation) are similar to symptoms of having too much alcohol. If your health care provider has given you approval to drink alcohol, do so in moderation and use the following guidelines:  Women should not have more than one drink per day, and men  should not have more than two drinks per day. One drink is equal to: ? 12 oz of beer. ? 5 oz of wine. ? 1 oz of hard liquor.  Do not drink on an empty stomach.  Keep yourself hydrated. Have water, diet soda, or unsweetened iced tea.  Regular soda, juice, and other mixers might contain a lot of carbohydrates and should be counted.  What foods are not recommended? As you make food choices, it is important to remember that all foods are not the same. Some foods have fewer nutrients per serving than other foods, even though they might have the same number of calories or carbohydrates. It is difficult to get your body what it needs when you eat foods with fewer nutrients. Examples of foods that you should avoid that are high in calories and carbohydrates but low in nutrients include:  Trans fats (most processed foods list trans fats on the Nutrition Facts label).  Regular soda.  Juice.  Candy.  Sweets, such as cake, pie, doughnuts, and cookies.  Fried foods.  What foods can I eat? Eat nutrient-rich foods, which will nourish your body and keep you healthy. The food you should eat also will depend on several factors, including:  The calories you need.  The medicines you take.  Your weight.  Your blood glucose level.  Your blood pressure level.  Your cholesterol level.  You should eat a variety of foods, including:  Protein. ? Lean cuts of meat. ? Proteins low in saturated fats, such as fish, egg whites, and beans. Avoid processed meats.  Fruits and vegetables. ? Fruits and vegetables that may help control blood glucose levels, such as apples,   mangoes, and yams.  Dairy products. ? Choose fat-free or low-fat dairy products, such as milk, yogurt, and cheese.  Grains, bread, pasta, and rice. ? Choose whole grain products, such as multigrain bread, whole oats, and brown rice. These foods may help control blood pressure.  Fats. ? Foods containing healthful fats, such as  nuts, avocado, olive oil, canola oil, and fish.  Does everyone with diabetes mellitus have the same meal plan? Because every person with diabetes mellitus is different, there is not one meal plan that works for everyone. It is very important that you meet with a dietitian who will help you create a meal plan that is just right for you. This information is not intended to replace advice given to you by your health care provider. Make sure you discuss any questions you have with your health care provider. Document Released: 03/23/2005 Document Revised: 12/02/2015 Document Reviewed: 05/23/2013 Elsevier Interactive Patient Education  2017 [employer]  

## 2016-12-07 ENCOUNTER — Other Ambulatory Visit: Payer: Self-pay | Admitting: Family Medicine

## 2016-12-07 DIAGNOSIS — M19049 Primary osteoarthritis, unspecified hand: Secondary | ICD-10-CM

## 2016-12-07 MED ORDER — DICLOFENAC SODIUM 75 MG PO TBEC: DELAYED_RELEASE_TABLET | ORAL | 3 refills | Status: DC

## 2016-12-07 MED FILL — metFORMIN HCL 1000 MG TABS: 1000 | 30 days supply | Qty: 75 | Fill #0

## 2016-12-25 MED FILL — LOSARTAN-HCTZ 50-12.5 MG TA: 50-12.5 | 30 days supply | Qty: 30 | Fill #1

## 2016-12-25 MED FILL — glipiZIDE ER 10 MG TB24: 10 | 30 days supply | Qty: 60 | Fill #3

## 2016-12-25 MED FILL — PRAVASTATIN NA 40 MG TAB: 40 | 30 days supply | Qty: 30 | Fill #2

## 2016-12-25 MED FILL — AMLODIPINE BESYLATE 10 MG T: 10 | 30 days supply | Qty: 30 | Fill #1

## 2017-01-05 MED FILL — metFORMIN HCL 1000 MG TABS: 1000 | 30 days supply | Qty: 75 | Fill #1

## 2017-01-08 MED FILL — DICLOFENAC SOD DR 75 MG TAB: 75 | 30 days supply | Qty: 60 | Fill #0

## 2017-01-23 MED FILL — PRAVASTATIN NA 40 MG TAB: 40 | 30 days supply | Qty: 30 | Fill #3

## 2017-01-23 MED FILL — LOSARTAN-HCTZ 50-12.5 MG TA: 50-12.5 | 30 days supply | Qty: 30 | Fill #2

## 2017-01-23 MED FILL — AMLODIPINE BESYLATE 10 MG T: 10 | 30 days supply | Qty: 30 | Fill #2

## 2017-01-25 MED FILL — glipiZIDE ER 10 MG TB24: 10 | 30 days supply | Qty: 60 | Fill #4

## 2017-02-08 MED FILL — metFORMIN HCL 1000 MG TABS: 1000 | 30 days supply | Qty: 75 | Fill #2

## 2017-02-23 MED FILL — PRAVASTATIN NA 40 MG TAB: 40 | 30 days supply | Qty: 30 | Fill #4

## 2017-03-01 MED FILL — LOSARTAN-HCTZ 50-12.5 MG TA: 50-12.5 | 30 days supply | Qty: 30 | Fill #3

## 2017-03-01 MED FILL — AMLODIPINE BESYLATE 10 MG T: 10 | 30 days supply | Qty: 30 | Fill #3

## 2017-03-01 MED FILL — glipiZIDE ER 10 MG TB24: 10 | 30 days supply | Qty: 60 | Fill #5

## 2017-03-05 ENCOUNTER — Other Ambulatory Visit: Payer: Self-pay | Admitting: Family Medicine

## 2017-03-05 DIAGNOSIS — E119 Type 2 diabetes mellitus without complications: Secondary | ICD-10-CM

## 2017-03-07 ENCOUNTER — Other Ambulatory Visit: Payer: Self-pay | Admitting: Family Medicine

## 2017-03-07 DIAGNOSIS — E119 Type 2 diabetes mellitus without complications: Secondary | ICD-10-CM

## 2017-03-07 MED FILL — metFORMIN HCL 1000 MG TABS: 1000 | 30 days supply | Qty: 75 | Fill #3

## 2017-03-28 ENCOUNTER — Ambulatory Visit: Payer: Medicare Other | Admitting: Family Medicine

## 2017-03-29 MED FILL — AMLODIPINE BESYLATE 10 MG T: 10 | 30 days supply | Qty: 30 | Fill #4

## 2017-03-29 MED FILL — LOSARTAN-HCTZ 50-12.5 MG TA: 50-12.5 | 30 days supply | Qty: 30 | Fill #4

## 2017-03-29 MED FILL — PRAVASTATIN NA 40 MG TAB: 40 | 30 days supply | Qty: 30 | Fill #5

## 2017-04-05 ENCOUNTER — Other Ambulatory Visit: Payer: Self-pay | Admitting: Family Medicine

## 2017-04-05 DIAGNOSIS — E119 Type 2 diabetes mellitus without complications: Secondary | ICD-10-CM

## 2017-04-05 MED FILL — ?DICLOFENAC SOD DR 75 MG TA: 75 | 30 days supply | Qty: 60 | Fill #1

## 2017-04-06 MED FILL — glipiZIDE XL 10 MG TB24: 10 | 30 days supply | Qty: 60 | Fill #0

## 2017-04-10 ENCOUNTER — Encounter: Payer: Self-pay | Admitting: Family Medicine

## 2017-04-10 ENCOUNTER — Ambulatory Visit: Payer: Medicare Other | Attending: Family Medicine | Admitting: Family Medicine

## 2017-04-10 VITALS — BP 140/72 | HR 84 | Temp 98.2°F | Ht 64.0 in | Wt 144.4 lb

## 2017-04-10 DIAGNOSIS — Z23 Encounter for immunization: Secondary | ICD-10-CM | POA: Insufficient documentation

## 2017-04-10 DIAGNOSIS — Z79899 Other long term (current) drug therapy: Secondary | ICD-10-CM | POA: Diagnosis not present

## 2017-04-10 DIAGNOSIS — M19049 Primary osteoarthritis, unspecified hand: Secondary | ICD-10-CM | POA: Insufficient documentation

## 2017-04-10 DIAGNOSIS — Z794 Long term (current) use of insulin: Secondary | ICD-10-CM | POA: Diagnosis not present

## 2017-04-10 DIAGNOSIS — E7800 Pure hypercholesterolemia, unspecified: Secondary | ICD-10-CM | POA: Insufficient documentation

## 2017-04-10 DIAGNOSIS — I10 Essential (primary) hypertension: Secondary | ICD-10-CM | POA: Diagnosis not present

## 2017-04-10 DIAGNOSIS — E1100 Type 2 diabetes mellitus with hyperosmolarity without nonketotic hyperglycemic-hyperosmolar coma (NKHHC): Secondary | ICD-10-CM | POA: Diagnosis not present

## 2017-04-10 DIAGNOSIS — E119 Type 2 diabetes mellitus without complications: Secondary | ICD-10-CM | POA: Diagnosis not present

## 2017-04-10 DIAGNOSIS — Z7982 Long term (current) use of aspirin: Secondary | ICD-10-CM | POA: Diagnosis not present

## 2017-04-10 DIAGNOSIS — Z9119 Patient's noncompliance with other medical treatment and regimen: Secondary | ICD-10-CM | POA: Diagnosis not present

## 2017-04-10 DIAGNOSIS — E1165 Type 2 diabetes mellitus with hyperglycemia: Secondary | ICD-10-CM | POA: Insufficient documentation

## 2017-04-10 LAB — POCT GLYCOSYLATED HEMOGLOBIN (HGB A1C): Hemoglobin A1C: 8.8

## 2017-04-10 LAB — GLUCOSE, POCT (MANUAL RESULT ENTRY): POC Glucose: 144 mg/dl — AB (ref 70–99)

## 2017-04-10 MED ORDER — AMLODIPINE BESYLATE 10 MG PO TABS: 10.0000 mg | ORAL_TABLET | Freq: Every day | ORAL | 5 refills | Status: DC

## 2017-04-10 MED ORDER — INSULIN PEN NEEDLE 31G X 5 MM MISC: 1.0000 | Freq: Every day | 5 refills | Status: DC

## 2017-04-10 MED ORDER — INSULIN GLARGINE 100 UNIT/ML SOLOSTAR PEN: 10.0000 [IU] | PEN_INJECTOR | Freq: Every day | SUBCUTANEOUS | 5 refills | Status: DC

## 2017-04-10 MED ORDER — PRAVASTATIN SODIUM 40 MG PO TABS: 40.0000 mg | ORAL_TABLET | Freq: Every day | ORAL | 5 refills | Status: DC

## 2017-04-10 MED ORDER — LOSARTAN POTASSIUM-HCTZ 50-12.5 MG PO TABS: 1.0000 | ORAL_TABLET | Freq: Every day | ORAL | 5 refills | Status: DC

## 2017-04-10 MED ORDER — METFORMIN HCL 1000 MG PO TABS: ORAL_TABLET | ORAL | 5 refills | Status: DC

## 2017-04-10 MED ORDER — GLIPIZIDE ER 10 MG PO TB24
20.0000 mg | ORAL_TABLET | Freq: Every day | ORAL | 5 refills | Status: DC
Start: 2017-04-10 — End: 2017-07-11

## 2017-04-10 MED ORDER — DICLOFENAC SODIUM 75 MG PO TBEC: DELAYED_RELEASE_TABLET | ORAL | 3 refills | Status: DC

## 2017-04-10 MED FILL — TRUEPLUS PEN NDL 31GX3/16": 31G X 5 MM | 30 days supply | Qty: 100 | Fill #0

## 2017-04-10 MED FILL — DICLOFENAC SOD DR 75 MG TAB: 75 | 30 days supply | Qty: 60 | Fill #0

## 2017-04-10 MED FILL — metFORMIN HCL 1000 MG TABS: 1000 | 30 days supply | Qty: 75 | Fill #0

## 2017-04-10 MED FILL — LANTUS SOLOSTAR 100 UNITS/M: 100 | 28 days supply | Qty: 3 | Fill #0

## 2017-04-10 MED FILL — TRUEPLUS PEN NDL 31GX3/16: 31G X 5 MM | 30 days supply | Qty: 100 | Fill #0

## 2017-04-10 NOTE — Patient Instructions (Signed)
Diabetes Mellitus and Food It is important for you to manage your blood sugar (glucose) level. Your blood glucose level can be greatly affected by what you eat. Eating healthier foods in the appropriate amounts throughout the day at about the same time each day will help you control your blood glucose level. It can also help slow or prevent worsening of your diabetes mellitus. Healthy eating may even help you improve the level of your blood pressure and reach or maintain a healthy weight. General recommendations for healthful eating and cooking habits include:  Eating meals and snacks regularly. Avoid going long periods of time without eating to lose weight.  Eating a diet that consists mainly of plant-based foods, such as fruits, vegetables, nuts, legumes, and whole grains.  Using low-heat cooking methods, such as baking, instead of high-heat cooking methods, such as deep frying.  Work with your dietitian to make sure you understand how to use the Nutrition Facts information on food labels. How can food affect me? Carbohydrates Carbohydrates affect your blood glucose level more than any other type of food. Your dietitian will help you determine how many carbohydrates to eat at each meal and teach you how to count carbohydrates. Counting carbohydrates is important to keep your blood glucose at a healthy level, especially if you are using insulin or taking certain medicines for diabetes mellitus. Alcohol Alcohol can cause sudden decreases in blood glucose (hypoglycemia), especially if you use insulin or take certain medicines for diabetes mellitus. Hypoglycemia can be a life-threatening condition. Symptoms of hypoglycemia (sleepiness, dizziness, and disorientation) are similar to symptoms of having too much alcohol. If your health care provider has given you approval to drink alcohol, do so in moderation and use the following guidelines:  Women should not have more than one drink per day, and men  should not have more than two drinks per day. One drink is equal to: ? 12 oz of beer. ? 5 oz of wine. ? 1 oz of hard liquor.  Do not drink on an empty stomach.  Keep yourself hydrated. Have water, diet soda, or unsweetened iced tea.  Regular soda, juice, and other mixers might contain a lot of carbohydrates and should be counted.  What foods are not recommended? As you make food choices, it is important to remember that all foods are not the same. Some foods have fewer nutrients per serving than other foods, even though they might have the same number of calories or carbohydrates. It is difficult to get your body what it needs when you eat foods with fewer nutrients. Examples of foods that you should avoid that are high in calories and carbohydrates but low in nutrients include:  Trans fats (most processed foods list trans fats on the Nutrition Facts label).  Regular soda.  Juice.  Candy.  Sweets, such as cake, pie, doughnuts, and cookies.  Fried foods.  What foods can I eat? Eat nutrient-rich foods, which will nourish your body and keep you healthy. The food you should eat also will depend on several factors, including:  The calories you need.  The medicines you take.  Your weight.  Your blood glucose level.  Your blood pressure level.  Your cholesterol level.  You should eat a variety of foods, including:  Protein. ? Lean cuts of meat. ? Proteins low in saturated fats, such as fish, egg whites, and beans. Avoid processed meats.  Fruits and vegetables. ? Fruits and vegetables that may help control blood glucose levels, such as apples,   mangoes, and yams.  Dairy products. ? Choose fat-free or low-fat dairy products, such as milk, yogurt, and cheese.  Grains, bread, pasta, and rice. ? Choose whole grain products, such as multigrain bread, whole oats, and brown rice. These foods may help control blood pressure.  Fats. ? Foods containing healthful fats, such as  nuts, avocado, olive oil, canola oil, and fish.  Does everyone with diabetes mellitus have the same meal plan? Because every person with diabetes mellitus is different, there is not one meal plan that works for everyone. It is very important that you meet with a dietitian who will help you create a meal plan that is just right for you. This information is not intended to replace advice given to you by your health care provider. Make sure you discuss any questions you have with your health care provider. Document Released: 03/23/2005 Document Revised: 12/02/2015 Document Reviewed: 05/23/2013 Elsevier Interactive Patient Education  2017 [employer]  

## 2017-04-10 NOTE — Progress Notes (Signed)
Subjective:  Patient ID: Jerry Serrano, male    DOB: [birth year]  Age: 66 y.o. MRN: [id number]  CC: Diabetes   HPI Jerry Serrano is a 66 year old male with history of hypertension, hyperlipidemia, type 2 diabetes mellitus (A1c 8.8 up from 7.8) who presents today for follow-up visit.  His diabetes has been uncontrolled due to running out of glipizide for the last 2 weeks and he endorses not taking Januvia for the last 3 months due to a high co-pay. He had previously declined initiation of insulin but is agreeable today. A currently working on a diabetic diet and exercise. Denies numbness in extremities, hypoglycemia visual complaints.  He is doing well on his antihypertensive and denies chest pain or shortness of breath.  Tolerates his statin with no complaints of myalgia.   He has been unable to see a dentist for implant insertion due to the dentist denying Medicare.   Past Medical History:  Diagnosis Date  . Allergy   . Arthritis   . Diabetes mellitus   . Hyperlipidemia   . Hypertension     Past Surgical History:  Procedure Laterality Date  . HERNIA REPAIR      Outpatient Medications Prior to Visit  Medication Sig Dispense Refill  . aspirin EC 81 MG tablet Take 1 tablet (81 mg total) by mouth daily. 30 tablet 3  . Blood Glucose Monitoring Suppl (ACCU-CHEK AVIVA PLUS) w/Device KIT 1 each by Does not apply route 4 (four) times daily - after meals and at bedtime. 1 kit 0  . glucose blood test strip Use as instructed 100 each 12  . Lancet Devices (ACCU-CHEK SOFTCLIX) lancets Use as instructed 1 each 12  . MICROLET LANCETS MISC Test 3 times per day 100 each 12  . TRUEPLUS LANCETS 28G MISC 1 Package by Does not apply route as directed. 100 each 12  . amLODipine (NORVASC) 10 MG tablet Take 1 tablet (10 mg total) by mouth daily. 30 tablet 5  . diclofenac (VOLTAREN) 75 MG EC tablet TAKE 1 TABLET BY MOUTH 2 TIMES DAILY AS NEEDED FOR MODERATE PAIN. 60 tablet 3  .  losartan-hydrochlorothiazide (HYZAAR) 50-12.5 MG tablet Take 1 tablet by mouth daily. 30 tablet 5  . metFORMIN (GLUCOPHAGE) 1000 MG tablet TAKE 1.5 TABLET ('1500MG'$ ) BY MOUTH IN THE MORNING AND 1 TABLET ('1000MG'$ ) IN THE EVENING. 75 tablet 5  . pravastatin (PRAVACHOL) 40 MG tablet Take 1 tablet (40 mg total) by mouth daily. 30 tablet 5  . glipiZIDE (GLIPIZIDE XL) 10 MG 24 hr tablet Take 2 tablets (20 mg total) by mouth daily with breakfast. (Patient not taking: Reported on 04/10/2017) 60 tablet 5  . sitaGLIPtin (JANUVIA) 100 MG tablet Take 1 tablet (100 mg total) by mouth daily. (Patient not taking: Reported on 04/10/2017) 30 tablet 5   Facility-Administered Medications Prior to Visit  Medication Dose Route Frequency Provider Last Rate Last Dose  . 0.9 %  sodium chloride infusion  500 mL Intravenous Continuous Danis, Estill Cotta III, MD        ROS Review of Systems  Constitutional: Negative for activity change and appetite change.  HENT: Negative for sinus pressure and sore throat.   Eyes: Negative for visual disturbance.  Respiratory: Negative for cough, chest tightness and shortness of breath.   Cardiovascular: Negative for chest pain and leg swelling.  Gastrointestinal: Negative for abdominal distention, abdominal pain, constipation and diarrhea.  Endocrine: Negative.   Genitourinary: Negative for dysuria.  Musculoskeletal: Negative for joint swelling and myalgias.  Skin: Negative for rash.  Allergic/Immunologic: Negative.   Neurological: Negative for weakness, light-headedness and numbness.  Psychiatric/Behavioral: Negative for dysphoric mood and suicidal ideas.    Objective:  BP 140/72   Pulse 84   Temp 98.2 F (36.8 C) (Oral)   Ht 5' 4" (1.626 m)   Wt 144 lb 6.4 oz (65.5 kg)   SpO2 99%   BMI 24.79 kg/m   BP/Weight 04/10/2017 12/05/2016 4/97/5300  Systolic BP 511 96 021  Diastolic BP 72 62 61  Wt. (Lbs) 144.4 145.4 149  BMI 24.79 24.96 25.58      Physical Exam    Constitutional: He is oriented to person, place, and time. He appears well-developed and well-nourished.  Cardiovascular: Normal rate, normal heart sounds and intact distal pulses.   No murmur heard. Pulmonary/Chest: Effort normal and breath sounds normal. He has no wheezes. He has no rales. He exhibits no tenderness.  Abdominal: Soft. Bowel sounds are normal. He exhibits no distension and no mass. There is no tenderness.  Musculoskeletal: Normal range of motion.  Neurological: He is alert and oriented to person, place, and time.  Skin: Skin is warm and dry.  Psychiatric: He has a normal mood and affect.    CMP Latest Ref Rng & Units 09/05/2016 02/29/2016 04/22/2015  Glucose 65 - 99 mg/dL 168(H) 163(H) 160(H)  BUN 7 - 25 mg/dL _0 Creatinine 0.70 - 1.25 mg/dL 0.90 0.88 0.94  Sodium 135 - 146 mmol/L 136 135 137  Potassium 3.5 - 5.3 mmol/L 4.2 4.2 4.7  Chloride 98 - 110 mmol/L 103 100 103  CO2 20 - 31 mmol/L _1 Calcium 8.6 - 10.3 mg/dL 9.6 9.7 9.9  Total Protein 6.1 - 8.1 g/dL 7.6 7.2 -  Total Bilirubin 0.2 - 1.2 mg/dL 0.5 0.7 -  Alkaline Phos 40 - 115 U/L 78 72 -  AST 10 - 35 U/L 22 18 -  ALT 9 - 46 U/L 23 17 -    Lipid Panel     Component Value Date/Time   CHOL 147 02/29/2016 1018   TRIG 58 02/29/2016 1018   HDL 52 02/29/2016 1018   CHOLHDL 2.8 02/29/2016 1018   VLDL 12 02/29/2016 1018   LDLCALC 83 02/29/2016 1018    Lab Results  Component Value Date   HGBA1C 8.8 04/10/2017    Assessment & Plan:   1. Type 2 diabetes mellitus without complication, with long-term current use of insulin (HCC) Uncontrolled with A1c of 8.8 He has not been compliant with Januvia due to cost Commenced on Lantus Keep blood sugar logs with fasting goals of 80-120 mg/dl, random of less than 180 and in the event of sugars less than 60 mg/dl or greater than 400 mg/dl please notify the clinic ASAP. It is recommended that you undergo annual eye exa assess compliance with diabetic  dietms and annual foot exams. Pneumovax is recommended every 5 years before the age of 53 and once for a lifetime at or after the age of 42.  - POCT glucose (manual entry) - POCT glycosylated hemoglobin (Hb A1C) - glipiZIDE (GLIPIZIDE XL) 10 MG 24 hr tablet; Take 2 tablets (20 mg total) by mouth daily with breakfast.  Dispense: 60 tablet; Refill: 5 - metFORMIN (GLUCOPHAGE) 1000 MG tablet; TAKE 1.5 TABLET (1500MG) BY MOUTH IN THE MORNING AND 1 TABLET (1000MG) IN THE EVENING.  Dispense: 75 tablet; Refill: 5 - Comprehensive metabolic panel - Lipid panel - Microalbumin/Creatinine Ratio, Urine - Insulin Glargine (  LANTUS SOLOSTAR) 100 UNIT/ML Solostar Pen; Inject 10 Units into the skin daily at 10 pm.  Dispense: 3 pen; Refill: 5 - Insulin Pen Needle 31G X 5 MM MISC; 1 each by Does not apply route at bedtime.  Dispense: 30 each; Refill: 5  2. Arthritis pain, hand No acute flare - diclofenac (VOLTAREN) 75 MG EC tablet; TAKE 1 TABLET BY MOUTH 2 TIMES DAILY AS NEEDED FOR MODERATE PAIN.  Dispense: 60 tablet; Refill: 3  3. Essential hypertension Slightly above goal of less than 130/80 Low-sodium diet No regimen changes - losartan-hydrochlorothiazide (HYZAAR) 50-12.5 MG tablet; Take 1 tablet by mouth daily.  Dispense: 30 tablet; Refill: 5 - amLODipine (NORVASC) 10 MG tablet; Take 1 tablet (10 mg total) by mouth daily.  Dispense: 30 tablet; Refill: 5  4. Pure hypercholesterolemia Controlled Low cholesterol diet - pravastatin (PRAVACHOL) 40 MG tablet; Take 1 tablet (40 mg total) by mouth daily.  Dispense: 30 tablet; Refill: 5  5. Need for vaccination against Streptococcus pneumoniae Prevnar 13 administered   Meds ordered this encounter  Medications  . glipiZIDE (GLIPIZIDE XL) 10 MG 24 hr tablet    Sig: Take 2 tablets (20 mg total) by mouth daily with breakfast.    Dispense:  60 tablet    Refill:  5  . metFORMIN (GLUCOPHAGE) 1000 MG tablet    Sig: TAKE 1.5 TABLET (1500MG) BY MOUTH IN THE  MORNING AND 1 TABLET (1000MG) IN THE EVENING.    Dispense:  75 tablet    Refill:  5  . diclofenac (VOLTAREN) 75 MG EC tablet    Sig: TAKE 1 TABLET BY MOUTH 2 TIMES DAILY AS NEEDED FOR MODERATE PAIN.    Dispense:  60 tablet    Refill:  3  . losartan-hydrochlorothiazide (HYZAAR) 50-12.5 MG tablet    Sig: Take 1 tablet by mouth daily.    Dispense:  30 tablet    Refill:  5  . amLODipine (NORVASC) 10 MG tablet    Sig: Take 1 tablet (10 mg total) by mouth daily.    Dispense:  30 tablet    Refill:  5  . Insulin Glargine (LANTUS SOLOSTAR) 100 UNIT/ML Solostar Pen    Sig: Inject 10 Units into the skin daily at 10 pm.    Dispense:  3 pen    Refill:  5  . Insulin Pen Needle 31G X 5 MM MISC    Sig: 1 each by Does not apply route at bedtime.    Dispense:  30 each    Refill:  5  . pravastatin (PRAVACHOL) 40 MG tablet    Sig: Take 1 tablet (40 mg total) by mouth daily.    Dispense:  30 tablet    Refill:  5    Follow-up: Return in about 3 months (around 07/11/2017) for Follow up on diabetes mellitus.   Arnoldo Morale MD

## 2017-04-11 LAB — COMPREHENSIVE METABOLIC PANEL
ALT: 21 IU/L (ref 0–44)
AST: 18 IU/L (ref 0–40)
Albumin/Globulin Ratio: 2 (ref 1.2–2.2)
Albumin: 4.5 g/dL (ref 3.6–4.8)
Alkaline Phosphatase: 64 IU/L (ref 39–117)
BUN/Creatinine Ratio: 19 (ref 10–24)
BUN: 15 mg/dL (ref 8–27)
Bilirubin Total: 0.4 mg/dL (ref 0.0–1.2)
CO2: 23 mmol/L (ref 20–29)
Calcium: 9.8 mg/dL (ref 8.6–10.2)
Chloride: 101 mmol/L (ref 96–106)
Creatinine, Ser: 0.81 mg/dL (ref 0.76–1.27)
GFR calc Af Amer: 106 mL/min/{1.73_m2} (ref >59)
GFR calc non Af Amer: 91 mL/min/{1.73_m2} (ref >59)
Globulin, Total: 2.3 g/dL (ref 1.5–4.5)
Glucose: 144 mg/dL — ABNORMAL HIGH (ref 65–99)
Potassium: 4.7 mmol/L (ref 3.5–5.2)
Sodium: 137 mmol/L (ref 134–144)
Total Protein: 6.8 g/dL (ref 6.0–8.5)

## 2017-04-11 LAB — LIPID PANEL
Chol/HDL Ratio: 3 ratio (ref 0.0–5.0)
Cholesterol, Total: 144 mg/dL (ref 100–199)
HDL: 48 mg/dL (ref >39)
LDL Calculated: 85 mg/dL (ref 0–99)
Triglycerides: 54 mg/dL (ref 0–149)
VLDL Cholesterol Cal: 11 mg/dL (ref 5–40)

## 2017-04-12 ENCOUNTER — Telehealth: Payer: Self-pay

## 2017-04-12 NOTE — Telephone Encounter (Signed)
Pt was called and informed of lab results. 

## 2017-04-30 MED FILL — AMLODIPINE BESYLATE 10 MG T: 10 | 30 days supply | Qty: 30 | Fill #5

## 2017-04-30 MED FILL — PRAVASTATIN NA 40 MG TAB: 40 | 30 days supply | Qty: 30 | Fill #0

## 2017-05-03 MED FILL — LOSARTAN-HCTZ 50-12.5 MG TA: 50-12.5 | 30 days supply | Qty: 30 | Fill #5

## 2017-05-09 MED FILL — glipiZIDE XL 10 MG TB24: 10 | 30 days supply | Qty: 60 | Fill #1

## 2017-05-11 MED FILL — metFORMIN HCL 1000 MG TABS: 1000 | 30 days supply | Qty: 75 | Fill #1

## 2017-05-18 MED FILL — LANTUS SOLOSTAR 100 UNITS/M: 100 | 28 days supply | Qty: 3 | Fill #1

## 2017-05-28 ENCOUNTER — Ambulatory Visit: Payer: Medicare Other

## 2017-05-28 MED FILL — AMLODIPINE BESYLATE 10 MG T: 10 | 30 days supply | Qty: 30 | Fill #0

## 2017-05-28 MED FILL — LOSARTAN-HCTZ 50-12.5 MG TA: 50-12.5 | 30 days supply | Qty: 30 | Fill #0

## 2017-05-28 MED FILL — PRAVASTATIN NA 40 MG TAB: 40 | 30 days supply | Qty: 30 | Fill #1

## 2017-06-08 MED FILL — DICLOFENAC SODIUM 75 MG TAB: 75 | 30 days supply | Qty: 60 | Fill #1

## 2017-06-08 MED FILL — glipiZIDE XL 10 MG TB24: 10 | 30 days supply | Qty: 60 | Fill #2

## 2017-06-12 MED FILL — metFORMIN HCL 1000 MG TABS: 1000 | 30 days supply | Qty: 75 | Fill #2

## 2017-06-29 MED FILL — PRAVASTATIN NA 40 MG TAB: 40 | 30 days supply | Qty: 30 | Fill #2

## 2017-06-29 MED FILL — AMLODIPINE BESYLATE 10 MG T: 10 | 30 days supply | Qty: 30 | Fill #1

## 2017-06-29 MED FILL — LOSARTAN-HCTZ 50-12.5 MG TA: 50-12.5 | 30 days supply | Qty: 30 | Fill #1

## 2017-07-04 MED FILL — LANTUS SOLOSTAR 100 UNITS/M: 100 | 28 days supply | Qty: 3 | Fill #2

## 2017-07-11 ENCOUNTER — Ambulatory Visit: Payer: Medicare Other | Attending: Family Medicine | Admitting: Family Medicine

## 2017-07-11 ENCOUNTER — Encounter: Payer: Self-pay | Admitting: Family Medicine

## 2017-07-11 VITALS — BP 146/70 | HR 89 | Temp 97.5°F | Ht 64.0 in | Wt 144.4 lb

## 2017-07-11 DIAGNOSIS — Z7982 Long term (current) use of aspirin: Secondary | ICD-10-CM | POA: Insufficient documentation

## 2017-07-11 DIAGNOSIS — Z794 Long term (current) use of insulin: Secondary | ICD-10-CM

## 2017-07-11 DIAGNOSIS — M19049 Primary osteoarthritis, unspecified hand: Secondary | ICD-10-CM | POA: Diagnosis not present

## 2017-07-11 DIAGNOSIS — M1990 Unspecified osteoarthritis, unspecified site: Secondary | ICD-10-CM | POA: Diagnosis not present

## 2017-07-11 DIAGNOSIS — E7800 Pure hypercholesterolemia, unspecified: Secondary | ICD-10-CM

## 2017-07-11 DIAGNOSIS — Z79899 Other long term (current) drug therapy: Secondary | ICD-10-CM | POA: Insufficient documentation

## 2017-07-11 DIAGNOSIS — I10 Essential (primary) hypertension: Secondary | ICD-10-CM | POA: Diagnosis not present

## 2017-07-11 DIAGNOSIS — E119 Type 2 diabetes mellitus without complications: Secondary | ICD-10-CM

## 2017-07-11 LAB — POCT GLYCOSYLATED HEMOGLOBIN (HGB A1C): Hemoglobin A1C: 8.5

## 2017-07-11 LAB — GLUCOSE, POCT (MANUAL RESULT ENTRY): POC Glucose: 142 mg/dl — AB (ref 70–99)

## 2017-07-11 MED ORDER — PRAVASTATIN SODIUM 40 MG PO TABS: 40.0000 mg | ORAL_TABLET | Freq: Every day | ORAL | 5 refills | Status: DC

## 2017-07-11 MED ORDER — GLIPIZIDE ER 10 MG PO TB24: 20.0000 mg | ORAL_TABLET | Freq: Every day | ORAL | 5 refills | Status: DC

## 2017-07-11 MED ORDER — INSULIN GLARGINE 100 UNIT/ML SOLOSTAR PEN: 10.0000 [IU] | PEN_INJECTOR | Freq: Every day | SUBCUTANEOUS | 5 refills | Status: DC

## 2017-07-11 MED ORDER — AMLODIPINE BESYLATE 10 MG PO TABS: 10.0000 mg | ORAL_TABLET | Freq: Every day | ORAL | 5 refills | Status: DC

## 2017-07-11 MED ORDER — LOSARTAN POTASSIUM-HCTZ 50-12.5 MG PO TABS: 1.0000 | ORAL_TABLET | Freq: Every day | ORAL | 5 refills | Status: DC

## 2017-07-11 MED ORDER — METFORMIN HCL 1000 MG PO TABS: ORAL_TABLET | ORAL | 5 refills | Status: DC

## 2017-07-11 MED ORDER — DICLOFENAC SODIUM 75 MG PO TBEC: DELAYED_RELEASE_TABLET | ORAL | 1 refills | Status: DC

## 2017-07-11 MED FILL — metFORMIN HCL 1000 MG TABS: 1000 | 30 days supply | Qty: 75 | Fill #0

## 2017-07-11 MED FILL — DICLOFENAC SODIUM 75 MG TAB: 75 | 30 days supply | Qty: 60 | Fill #0

## 2017-07-11 MED FILL — glipiZIDE XL 10 MG TB24: 10 | 30 days supply | Qty: 60 | Fill #0

## 2017-07-11 NOTE — Patient Instructions (Signed)
Diabetes Mellitus and Nutrition When you have diabetes (diabetes mellitus), it is very important to have healthy eating habits because your blood sugar (glucose) levels are greatly affected by what you eat and drink. Eating healthy foods in the appropriate amounts, at about the same times every day, can help you:  Control your blood glucose.  Lower your risk of heart disease.  Improve your blood pressure.  Reach or maintain a healthy weight.  Every person with diabetes is different, and each person has different needs for a meal plan. Your health care provider may recommend that you work with a diet and nutrition specialist (dietitian) to make a meal plan that is best for you. Your meal plan may vary depending on factors such as:  The calories you need.  The medicines you take.  Your weight.  Your blood glucose, blood pressure, and cholesterol levels.  Your activity level.  Other health conditions you have, such as heart or kidney disease.  How do carbohydrates affect me? Carbohydrates affect your blood glucose level more than any other type of food. Eating carbohydrates naturally increases the amount of glucose in your blood. Carbohydrate counting is a method for keeping track of how many carbohydrates you eat. Counting carbohydrates is important to keep your blood glucose at a healthy level, especially if you use insulin or take certain oral diabetes medicines. It is important to know how many carbohydrates you can safely have in each meal. This is different for every person. Your dietitian can help you calculate how many carbohydrates you should have at each meal and for snack. Foods that contain carbohydrates include:  Bread, cereal, rice, pasta, and crackers.  Potatoes and corn.  Peas, beans, and lentils.  Milk and yogurt.  Fruit and juice.  Desserts, such as cakes, cookies, ice cream, and candy.  How does alcohol affect me? Alcohol can cause a sudden decrease in blood  glucose (hypoglycemia), especially if you use insulin or take certain oral diabetes medicines. Hypoglycemia can be a life-threatening condition. Symptoms of hypoglycemia (sleepiness, dizziness, and confusion) are similar to symptoms of having too much alcohol. If your health care provider says that alcohol is safe for you, follow these guidelines:  Limit alcohol intake to no more than 1 drink per day for nonpregnant women and 2 drinks per day for men. One drink equals 12 oz of beer, 5 oz of wine, or 1 oz of hard liquor.  Do not drink on an empty stomach.  Keep yourself hydrated with water, diet soda, or unsweetened iced tea.  Keep in mind that regular soda, juice, and other mixers may contain a lot of sugar and must be counted as carbohydrates.  What are tips for following this plan? Reading food labels  Start by checking the serving size on the label. The amount of calories, carbohydrates, fats, and other nutrients listed on the label are based on one serving of the food. Many foods contain more than one serving per package.  Check the total grams (g) of carbohydrates in one serving. You can calculate the number of servings of carbohydrates in one serving by dividing the total carbohydrates by 15. For example, if a food has 30 g of total carbohydrates, it would be equal to 2 servings of carbohydrates.  Check the number of grams (g) of saturated and trans fats in one serving. Choose foods that have low or no amount of these fats.  Check the number of milligrams (mg) of sodium in one serving. Most people   should limit total sodium intake to less than 2,300 mg per day.  Always check the nutrition information of foods labeled as "low-fat" or "nonfat". These foods may be higher in added sugar or refined carbohydrates and should be avoided.  Talk to your dietitian to identify your daily goals for nutrients listed on the label. Shopping  Avoid buying canned, premade, or processed foods. These  foods tend to be high in fat, sodium, and added sugar.  Shop around the outside edge of the grocery store. This includes fresh fruits and vegetables, bulk grains, fresh meats, and fresh dairy. Cooking  Use low-heat cooking methods, such as baking, instead of high-heat cooking methods like deep frying.  Cook using healthy oils, such as olive, canola, or sunflower oil.  Avoid cooking with butter, cream, or high-fat meats. Meal planning  Eat meals and snacks regularly, preferably at the same times every day. Avoid going long periods of time without eating.  Eat foods high in fiber, such as fresh fruits, vegetables, beans, and whole grains. Talk to your dietitian about how many servings of carbohydrates you can eat at each meal.  Eat 4-6 ounces of lean protein each day, such as lean meat, chicken, fish, eggs, or tofu. 1 ounce is equal to 1 ounce of meat, chicken, or fish, 1 egg, or 1/4 cup of tofu.  Eat some foods each day that contain healthy fats, such as avocado, nuts, seeds, and fish. Lifestyle   Check your blood glucose regularly.  Exercise at least 30 minutes 5 or more days each week, or as told by your health care provider.  Take medicines as told by your health care provider.  Do not use any products that contain nicotine or tobacco, such as cigarettes and e-cigarettes. If you need help quitting, ask your health care provider.  Work with a counselor or diabetes educator to identify strategies to manage stress and any emotional and social challenges. What are some questions to ask my health care provider?  Do I need to meet with a diabetes educator?  Do I need to meet with a dietitian?  What number can I call if I have questions?  When are the best times to check my blood glucose? Where to find more information:  American Diabetes Association: diabetes.org/food-and-fitness/food  Academy of Nutrition and Dietetics:  www.eatright.org/resources/health/diseases-and-conditions/diabetes  National Institute of Diabetes and Digestive and Kidney Diseases (NIH): www.niddk.nih.gov/health-information/diabetes/overview/diet-eating-physical-activity Summary  A healthy meal plan will help you control your blood glucose and maintain a healthy lifestyle.  Working with a diet and nutrition specialist (dietitian) can help you make a meal plan that is best for you.  Keep in mind that carbohydrates and alcohol have immediate effects on your blood glucose levels. It is important to count carbohydrates and to use alcohol carefully. This information is not intended to replace advice given to you by your health care provider. Make sure you discuss any questions you have with your health care provider. Document Released: 03/23/2005 Document Revised: 07/31/2016 Document Reviewed: 07/31/2016 Elsevier Interactive Patient Education  2018 Elsevier Inc.  

## 2017-07-11 NOTE — Progress Notes (Signed)
Subjective:  Patient ID: Jerry Serrano, male    DOB: 06/22/1951  Age: 67 y.o. MRN: [id number]  CC: Diabetes   HPI Jerry Serrano is a 67 year old male with history of hypertension, hyperlipidemia, type 2 diabetes mellitus (A1c 8.5) who presents today for follow-up visit.  His blood sugar log reveals his fasting sugars have been in the 70-80 range and he denies hypoglycemia but then states he has been chewing gum because he felt blood sugars were low. He denies numbness in extremities blurry vision.  He has been compliant with his statin and denies any adverse effects. Also tolerates his statin with no complaints of myalgias. The major form of exercise he gets is at his job where he works at the [employer].  He denies chest pain, shortness of breath.  Past Medical History:  Diagnosis Date  . Allergy   . Arthritis   . Diabetes mellitus   . Hyperlipidemia   . Hypertension     Past Surgical History:  Procedure Laterality Date  . HERNIA REPAIR      No Known Allergies   Outpatient Medications Prior to Visit  Medication Sig Dispense Refill  . aspirin EC 81 MG tablet Take 1 tablet (81 mg total) by mouth daily. 30 tablet 3  . Blood Glucose Monitoring Suppl (ACCU-CHEK AVIVA PLUS) w/Device KIT 1 each by Does not apply route 4 (four) times daily - after meals and at bedtime. 1 kit 0  . glucose blood test strip Use as instructed 100 each 12  . Insulin Pen Needle 31G X 5 MM MISC 1 each by Does not apply route at bedtime. 30 each 5  . Lancet Devices (ACCU-CHEK SOFTCLIX) lancets Use as instructed 1 each 12  . MICROLET LANCETS MISC Test 3 times per day 100 each 12  . TRUEPLUS LANCETS 28G MISC 1 Package by Does not apply route as directed. 100 each 12  . amLODipine (NORVASC) 10 MG tablet Take 1 tablet (10 mg total) by mouth daily. 30 tablet 5  . diclofenac (VOLTAREN) 75 MG EC tablet TAKE 1 TABLET BY MOUTH 2 TIMES DAILY AS NEEDED FOR MODERATE PAIN. 60 tablet 3  . glipiZIDE  (GLIPIZIDE XL) 10 MG 24 hr tablet Take 2 tablets (20 mg total) by mouth daily with breakfast. 60 tablet 5  . Insulin Glargine (LANTUS SOLOSTAR) 100 UNIT/ML Solostar Pen Inject 10 Units into the skin daily at 10 pm. 3 pen 5  . losartan-hydrochlorothiazide (HYZAAR) 50-12.5 MG tablet Take 1 tablet by mouth daily. 30 tablet 5  . metFORMIN (GLUCOPHAGE) 1000 MG tablet TAKE 1.5 TABLET (1500MG) BY MOUTH IN THE MORNING AND 1 TABLET (1000MG) IN THE EVENING. 75 tablet 5  . pravastatin (PRAVACHOL) 40 MG tablet Take 1 tablet (40 mg total) by mouth daily. 30 tablet 5   Facility-Administered Medications Prior to Visit  Medication Dose Route Frequency Provider Last Rate Last Dose  . 0.9 %  sodium chloride infusion  500 mL Intravenous Continuous Danis, Estill Cotta III, MD        ROS Review of Systems  Constitutional: Negative for activity change and appetite change.  HENT: Negative for sinus pressure and sore throat.   Eyes: Negative for visual disturbance.  Respiratory: Negative for cough, chest tightness and shortness of breath.   Cardiovascular: Negative for chest pain and leg swelling.  Gastrointestinal: Negative for abdominal distention, abdominal pain, constipation and diarrhea.  Endocrine: Negative.   Genitourinary: Negative for dysuria.  Musculoskeletal: Negative for joint swelling and  myalgias.  Skin: Negative for rash.  Allergic/Immunologic: Negative.   Neurological: Negative for weakness, light-headedness and numbness.  Psychiatric/Behavioral: Negative for dysphoric mood and suicidal ideas.    Objective:  BP (!) 146/70   Pulse 89   Temp (!) 97.5 F (36.4 C) (Oral)   Ht _0  (1.626 m)   Wt 144 lb 6.4 oz (65.5 kg)   SpO2 97%   BMI 24.79 kg/m   BP/Weight 07/11/2017 04/10/2017 0/98/1191  Systolic BP 478 295 96  Diastolic BP 70 72 62  Wt. (Lbs) 144.4 144.4 145.4  BMI 24.79 24.79 24.96      Physical Exam  Constitutional: He is oriented to person, place, and time. He appears  well-developed and well-nourished.  Cardiovascular: Normal rate, normal heart sounds and intact distal pulses.  No murmur heard. Pulmonary/Chest: Effort normal and breath sounds normal. He has no wheezes. He has no rales. He exhibits no tenderness.  Abdominal: Soft. Bowel sounds are normal. He exhibits no distension and no mass. There is no tenderness.  Musculoskeletal: Normal range of motion.  Neurological: He is alert and oriented to person, place, and time.  Skin: Skin is warm and dry.  Psychiatric: He has a normal mood and affect.     Lab Results  Component Value Date   HGBA1C 8.5 07/11/2017    CMP Latest Ref Rng & Units 04/10/2017 09/05/2016 02/29/2016  Glucose 65 - 99 mg/dL 144(H) 168(H) 163(H)  BUN 8 - 27 mg/dL _1 Creatinine 0.76 - 1.27 mg/dL 0.81 0.90 0.88  Sodium 134 - 144 mmol/L 137 136 135  Potassium 3.5 - 5.2 mmol/L 4.7 4.2 4.2  Chloride 96 - 106 mmol/L 101 103 100  CO2 20 - 29 mmol/L _2 Calcium 8.6 - 10.2 mg/dL 9.8 9.6 9.7  Total Protein 6.0 - 8.5 g/dL 6.8 7.6 7.2  Total Bilirubin 0.0 - 1.2 mg/dL 0.4 0.5 0.7  Alkaline Phos 39 - 117 IU/L 64 78 72  AST 0 - 40 IU/L _3 ALT 0 - 44 IU/L _4 Assessment & Plan:   1. Type 2 diabetes mellitus without complication, with long-term current use of insulin (HCC) Uncontrolled with A1c of 8.5 Fasting blood sugars have been in the 70-80 range Strongly emphasized compliance with diabetic diet; he may need mealtime insulin if A1c remains elevated - POCT glucose (manual entry) - POCT glycosylated hemoglobin (Hb A1C) - glipiZIDE (GLIPIZIDE XL) 10 MG 24 hr tablet; Take 2 tablets (20 mg total) by mouth daily with breakfast.  Dispense: 60 tablet; Refill: 5 - Insulin Glargine (LANTUS SOLOSTAR) 100 UNIT/ML Solostar Pen; Inject 10 Units into the skin daily at 10 pm.  Dispense: 3 pen; Refill: 5 - metFORMIN (GLUCOPHAGE) 1000 MG tablet; TAKE 1.5 TABLET (1500MG) BY MOUTH IN THE MORNING AND 1 TABLET (1000MG) IN THE  EVENING.  Dispense: 75 tablet; Refill: 5  2. Essential hypertension Slightly elevated above 130/80 No regimen change, continue current medications. Low-sodium diet, lifestyle modifications - amLODipine (NORVASC) 10 MG tablet; Take 1 tablet (10 mg total) by mouth daily.  Dispense: 30 tablet; Refill: 5 - losartan-hydrochlorothiazide (HYZAAR) 50-12.5 MG tablet; Take 1 tablet by mouth daily.  Dispense: 30 tablet; Refill: 5  3. Pure hypercholesterolemia Controlled - pravastatin (PRAVACHOL) 40 MG tablet; Take 1 tablet (40 mg total) by mouth daily.  Dispense: 30 tablet; Refill: 5  4. Arthritis pain, hand Controlled Advised to use diclofenac only as needed - diclofenac (VOLTAREN) 75  MG EC tablet; TAKE 1 TABLET BY MOUTH 2 TIMES DAILY AS NEEDED FOR MODERATE PAIN.  Dispense: 60 tablet; Refill: 1   Meds ordered this encounter  Medications  . amLODipine (NORVASC) 10 MG tablet    Sig: Take 1 tablet (10 mg total) by mouth daily.    Dispense:  30 tablet    Refill:  5  . glipiZIDE (GLIPIZIDE XL) 10 MG 24 hr tablet    Sig: Take 2 tablets (20 mg total) by mouth daily with breakfast.    Dispense:  60 tablet    Refill:  5  . Insulin Glargine (LANTUS SOLOSTAR) 100 UNIT/ML Solostar Pen    Sig: Inject 10 Units into the skin daily at 10 pm.    Dispense:  3 pen    Refill:  5  . losartan-hydrochlorothiazide (HYZAAR) 50-12.5 MG tablet    Sig: Take 1 tablet by mouth daily.    Dispense:  30 tablet    Refill:  5  . metFORMIN (GLUCOPHAGE) 1000 MG tablet    Sig: TAKE 1.5 TABLET (1500MG) BY MOUTH IN THE MORNING AND 1 TABLET (1000MG) IN THE EVENING.    Dispense:  75 tablet    Refill:  5  . pravastatin (PRAVACHOL) 40 MG tablet    Sig: Take 1 tablet (40 mg total) by mouth daily.    Dispense:  30 tablet    Refill:  5  . diclofenac (VOLTAREN) 75 MG EC tablet    Sig: TAKE 1 TABLET BY MOUTH 2 TIMES DAILY AS NEEDED FOR MODERATE PAIN.    Dispense:  60 tablet    Refill:  1    Follow-up: Return in about 3  months (around 10/09/2017) for follow up of diabetes mellitus.   Arnoldo Morale MD

## 2017-07-12 ENCOUNTER — Other Ambulatory Visit: Payer: Self-pay | Admitting: Internal Medicine

## 2017-07-13 ENCOUNTER — Other Ambulatory Visit: Payer: Self-pay | Admitting: Internal Medicine

## 2017-07-13 ENCOUNTER — Telehealth: Payer: Self-pay

## 2017-07-13 MED ORDER — ONETOUCH ULTRASOFT LANCETS MISC: 12 refills | Status: DC

## 2017-07-13 MED ORDER — GLUCOSE BLOOD VI STRP: ORAL_STRIP | 12 refills | Status: DC

## 2017-07-13 MED ORDER — ONETOUCH ULTRA MINI W/DEVICE KIT: PACK | 0 refills | Status: DC

## 2017-07-13 MED FILL — ONE TOUCH ULTRASOFT LANCETS: 30 days supply | Qty: 100 | Fill #0

## 2017-07-13 MED FILL — ONE TOUCH ULTRAMINI METER: W/DEVICE | 1 days supply | Qty: 1 | Fill #0

## 2017-07-13 MED FILL — ONE TOUCH ULTRA TEST STRIPS: 30 days supply | Qty: 100 | Fill #0

## 2017-07-13 NOTE — Telephone Encounter (Signed)
Error

## 2017-07-18 ENCOUNTER — Other Ambulatory Visit: Payer: Self-pay | Admitting: Pharmacist

## 2017-07-18 MED ORDER — GLUCOSE BLOOD VI STRP: ORAL_STRIP | 12 refills | Status: DC

## 2017-07-30 MED FILL — AMLODIPINE BESYLATE 10 MG T: 10 | 30 days supply | Qty: 30 | Fill #2

## 2017-07-30 MED FILL — LOSARTAN-HCTZ 50-12.5 MG TA: 50-12.5 | 30 days supply | Qty: 30 | Fill #2

## 2017-07-30 MED FILL — PRAVASTATIN NA 40 MG TAB: 40 | 30 days supply | Qty: 30 | Fill #3

## 2017-07-31 MED FILL — LANTUS SOLOSTAR 100 UNITS/M: 100 | 28 days supply | Qty: 3 | Fill #3

## 2017-08-07 MED FILL — TRUEPLUS PEN NDL 31GX3/16: 31G X 5 MM | 30 days supply | Qty: 100 | Fill #1

## 2017-08-07 MED FILL — TRUEPLUS PEN NDL 31GX3/16": 31G X 5 MM | 30 days supply | Qty: 100 | Fill #1

## 2017-08-10 MED FILL — ONE TOUCH ULTRASOFT LANCETS: 30 days supply | Qty: 100 | Fill #1

## 2017-08-13 MED FILL — glipiZIDE XL 10 MG TB24: 10 | 30 days supply | Qty: 60 | Fill #1

## 2017-08-15 ENCOUNTER — Other Ambulatory Visit: Payer: Self-pay | Admitting: Family Medicine

## 2017-08-15 DIAGNOSIS — E119 Type 2 diabetes mellitus without complications: Secondary | ICD-10-CM

## 2017-08-20 MED FILL — metFORMIN HCL 1000 MG TABS: 1000 | 30 days supply | Qty: 75 | Fill #1

## 2017-09-03 MED FILL — LOSARTAN-HCTZ 50-12.5 MG TA: 50-12.5 | 30 days supply | Qty: 30 | Fill #3

## 2017-09-03 MED FILL — PRAVASTATIN NA 40 MG TAB: 40 | 30 days supply | Qty: 30 | Fill #4

## 2017-09-03 MED FILL — AMLODIPINE BESYLATE 10 MG T: 10 | 30 days supply | Qty: 30 | Fill #3

## 2017-09-17 MED FILL — glipiZIDE ER 10 MG TB24: 10 | 30 days supply | Qty: 60 | Fill #2

## 2017-09-17 MED FILL — DICLOFENAC SODIUM 75 MG TAB: 75 | 30 days supply | Qty: 60 | Fill #1

## 2017-09-25 MED FILL — metFORMIN HCL 1000 MG TABS: 1000 | 30 days supply | Qty: 75 | Fill #2

## 2017-09-25 MED FILL — LANTUS SOLOSTAR 100 UNITS/M: 100 | 28 days supply | Qty: 3 | Fill #4

## 2017-10-01 MED FILL — AMLODIPINE BESYLATE 10 MG T: 10 | 30 days supply | Qty: 30 | Fill #4

## 2017-10-01 MED FILL — PRAVASTATIN NA 40 MG TAB: 40 | 30 days supply | Qty: 30 | Fill #5

## 2017-10-01 MED FILL — LOSARTAN-HCTZ 50-12.5 MG TA: 50-12.5 | 30 days supply | Qty: 30 | Fill #4

## 2017-10-10 ENCOUNTER — Encounter: Payer: Self-pay | Admitting: Family Medicine

## 2017-10-10 ENCOUNTER — Ambulatory Visit: Payer: Medicare Other | Attending: Family Medicine | Admitting: Family Medicine

## 2017-10-10 VITALS — BP 121/67 | HR 87 | Temp 98.2°F | Ht 64.0 in | Wt 148.8 lb

## 2017-10-10 DIAGNOSIS — M19049 Primary osteoarthritis, unspecified hand: Secondary | ICD-10-CM

## 2017-10-10 DIAGNOSIS — Z79899 Other long term (current) drug therapy: Secondary | ICD-10-CM | POA: Insufficient documentation

## 2017-10-10 DIAGNOSIS — E7800 Pure hypercholesterolemia, unspecified: Secondary | ICD-10-CM | POA: Diagnosis not present

## 2017-10-10 DIAGNOSIS — E119 Type 2 diabetes mellitus without complications: Secondary | ICD-10-CM | POA: Diagnosis not present

## 2017-10-10 DIAGNOSIS — Z794 Long term (current) use of insulin: Secondary | ICD-10-CM | POA: Insufficient documentation

## 2017-10-10 DIAGNOSIS — Z7982 Long term (current) use of aspirin: Secondary | ICD-10-CM | POA: Diagnosis not present

## 2017-10-10 DIAGNOSIS — Z1159 Encounter for screening for other viral diseases: Secondary | ICD-10-CM

## 2017-10-10 DIAGNOSIS — I10 Essential (primary) hypertension: Secondary | ICD-10-CM

## 2017-10-10 LAB — GLUCOSE, POCT (MANUAL RESULT ENTRY): POC Glucose: 105 mg/dl — AB (ref 70–99)

## 2017-10-10 LAB — POCT GLYCOSYLATED HEMOGLOBIN (HGB A1C): Hemoglobin A1C: 9.6

## 2017-10-10 MED ORDER — GLIPIZIDE ER 10 MG PO TB24: 20.0000 mg | ORAL_TABLET | Freq: Every day | ORAL | 5 refills | Status: DC

## 2017-10-10 MED ORDER — METFORMIN HCL 1000 MG PO TABS: ORAL_TABLET | ORAL | 5 refills | Status: DC

## 2017-10-10 MED ORDER — CANAGLIFLOZIN 100 MG PO TABS: 100.0000 mg | ORAL_TABLET | Freq: Every day | ORAL | 6 refills | Status: DC

## 2017-10-10 MED ORDER — LOSARTAN POTASSIUM-HCTZ 50-12.5 MG PO TABS: 1.0000 | ORAL_TABLET | Freq: Every day | ORAL | 5 refills | Status: DC

## 2017-10-10 MED ORDER — PRAVASTATIN SODIUM 40 MG PO TABS: 40.0000 mg | ORAL_TABLET | Freq: Every day | ORAL | 5 refills | Status: DC

## 2017-10-10 MED ORDER — INSULIN GLARGINE 100 UNIT/ML SOLOSTAR PEN: 15.0000 [IU] | PEN_INJECTOR | Freq: Every day | SUBCUTANEOUS | 5 refills | Status: DC

## 2017-10-10 MED ORDER — DICLOFENAC SODIUM 75 MG PO TBEC: DELAYED_RELEASE_TABLET | ORAL | 1 refills | Status: DC

## 2017-10-10 MED ORDER — AMLODIPINE BESYLATE 10 MG PO TABS: 10.0000 mg | ORAL_TABLET | Freq: Every day | ORAL | 5 refills | Status: DC

## 2017-10-10 MED FILL — DICLOFENAC SODIUM 75 MG TAB: 75 | 30 days supply | Qty: 60 | Fill #0

## 2017-10-10 MED FILL — glipiZIDE ER 10 MG TB24: 10 | 30 days supply | Qty: 60 | Fill #0

## 2017-10-10 MED FILL — LANTUS SOLOSTAR 100 UNITS/M: 100 | 20 days supply | Qty: 3 | Fill #0

## 2017-10-10 MED FILL — INVOKANA 100 MG TABLET: 100 | 30 days supply | Qty: 30 | Fill #0

## 2017-10-10 NOTE — Patient Instructions (Signed)
Diabetes Mellitus and Nutrition When you have diabetes (diabetes mellitus), it is very important to have healthy eating habits because your blood sugar (glucose) levels are greatly affected by what you eat and drink. Eating healthy foods in the appropriate amounts, at about the same times every day, can help you:  Control your blood glucose.  Lower your risk of heart disease.  Improve your blood pressure.  Reach or maintain a healthy weight.  Every person with diabetes is different, and each person has different needs for a meal plan. Your health care provider may recommend that you work with a diet and nutrition specialist (dietitian) to make a meal plan that is best for you. Your meal plan may vary depending on factors such as:  The calories you need.  The medicines you take.  Your weight.  Your blood glucose, blood pressure, and cholesterol levels.  Your activity level.  Other health conditions you have, such as heart or kidney disease.  How do carbohydrates affect me? Carbohydrates affect your blood glucose level more than any other type of food. Eating carbohydrates naturally increases the amount of glucose in your blood. Carbohydrate counting is a method for keeping track of how many carbohydrates you eat. Counting carbohydrates is important to keep your blood glucose at a healthy level, especially if you use insulin or take certain oral diabetes medicines. It is important to know how many carbohydrates you can safely have in each meal. This is different for every person. Your dietitian can help you calculate how many carbohydrates you should have at each meal and for snack. Foods that contain carbohydrates include:  Bread, cereal, rice, pasta, and crackers.  Potatoes and corn.  Peas, beans, and lentils.  Milk and yogurt.  Fruit and juice.  Desserts, such as cakes, cookies, ice cream, and candy.  How does alcohol affect me? Alcohol can cause a sudden decrease in blood  glucose (hypoglycemia), especially if you use insulin or take certain oral diabetes medicines. Hypoglycemia can be a life-threatening condition. Symptoms of hypoglycemia (sleepiness, dizziness, and confusion) are similar to symptoms of having too much alcohol. If your health care provider says that alcohol is safe for you, follow these guidelines:  Limit alcohol intake to no more than 1 drink per day for nonpregnant women and 2 drinks per day for men. One drink equals 12 oz of beer, 5 oz of wine, or 1 oz of hard liquor.  Do not drink on an empty stomach.  Keep yourself hydrated with water, diet soda, or unsweetened iced tea.  Keep in mind that regular soda, juice, and other mixers may contain a lot of sugar and must be counted as carbohydrates.  What are tips for following this plan? Reading food labels  Start by checking the serving size on the label. The amount of calories, carbohydrates, fats, and other nutrients listed on the label are based on one serving of the food. Many foods contain more than one serving per package.  Check the total grams (g) of carbohydrates in one serving. You can calculate the number of servings of carbohydrates in one serving by dividing the total carbohydrates by 15. For example, if a food has 30 g of total carbohydrates, it would be equal to 2 servings of carbohydrates.  Check the number of grams (g) of saturated and trans fats in one serving. Choose foods that have low or no amount of these fats.  Check the number of milligrams (mg) of sodium in one serving. Most people   should limit total sodium intake to less than 2,300 mg per day.  Always check the nutrition information of foods labeled as "low-fat" or "nonfat". These foods may be higher in added sugar or refined carbohydrates and should be avoided.  Talk to your dietitian to identify your daily goals for nutrients listed on the label. Shopping  Avoid buying canned, premade, or processed foods. These  foods tend to be high in fat, sodium, and added sugar.  Shop around the outside edge of the grocery store. This includes fresh fruits and vegetables, bulk grains, fresh meats, and fresh dairy. Cooking  Use low-heat cooking methods, such as baking, instead of high-heat cooking methods like deep frying.  Cook using healthy oils, such as olive, canola, or sunflower oil.  Avoid cooking with butter, cream, or high-fat meats. Meal planning  Eat meals and snacks regularly, preferably at the same times every day. Avoid going long periods of time without eating.  Eat foods high in fiber, such as fresh fruits, vegetables, beans, and whole grains. Talk to your dietitian about how many servings of carbohydrates you can eat at each meal.  Eat 4-6 ounces of lean protein each day, such as lean meat, chicken, fish, eggs, or tofu. 1 ounce is equal to 1 ounce of meat, chicken, or fish, 1 egg, or 1/4 cup of tofu.  Eat some foods each day that contain healthy fats, such as avocado, nuts, seeds, and fish. Lifestyle   Check your blood glucose regularly.  Exercise at least 30 minutes 5 or more days each week, or as told by your health care provider.  Take medicines as told by your health care provider.  Do not use any products that contain nicotine or tobacco, such as cigarettes and e-cigarettes. If you need help quitting, ask your health care provider.  Work with a counselor or diabetes educator to identify strategies to manage stress and any emotional and social challenges. What are some questions to ask my health care provider?  Do I need to meet with a diabetes educator?  Do I need to meet with a dietitian?  What number can I call if I have questions?  When are the best times to check my blood glucose? Where to find more information:  American Diabetes Association: diabetes.org/food-and-fitness/food  Academy of Nutrition and Dietetics:  www.eatright.org/resources/health/diseases-and-conditions/diabetes  National Institute of Diabetes and Digestive and Kidney Diseases (NIH): www.niddk.nih.gov/health-information/diabetes/overview/diet-eating-physical-activity Summary  A healthy meal plan will help you control your blood glucose and maintain a healthy lifestyle.  Working with a diet and nutrition specialist (dietitian) can help you make a meal plan that is best for you.  Keep in mind that carbohydrates and alcohol have immediate effects on your blood glucose levels. It is important to count carbohydrates and to use alcohol carefully. This information is not intended to replace advice given to you by your health care provider. Make sure you discuss any questions you have with your health care provider. Document Released: 03/23/2005 Document Revised: 07/31/2016 Document Reviewed: 07/31/2016 Elsevier Interactive Patient Education  2018 Elsevier Inc.  

## 2017-10-10 NOTE — Progress Notes (Signed)
Subjective:  Patient ID: Jerry Serrano, male    DOB: [birth year]  Age: 67 y.o. MRN: [id number]  CC: Diabetes   HPI Jerry Serrano is a 67 year old male with history of hypertension, hyperlipidemia, type 2 diabetes mellitus (A1c 9.6) who presents today for follow-up visit.  His A1c is 9.6 which has trended up from 8.5 previously for which I had commenced Lantus at his last office visit however he informs me a friend had told him to take Lantus a couple of days of the week rather than daily but he has been compliant with his oral diabetic medications and does not have his blood sugar log with him. He denies numbness in extremities blurry vision; last eye exam was in 02/2016 and he is due for an annual eye exam. He has been compliant with his statin and denies any adverse effects. Also tolerates his statin with no complaints of myalgias. The major form of exercise he gets is at his job where he works at the [employer]. He denies chest pain, shortness of breath and has no additional concerns today.   Past Medical History:  Diagnosis Date  . Allergy   . Arthritis   . Diabetes mellitus   . Hyperlipidemia   . Hypertension     Past Surgical History:  Procedure Laterality Date  . HERNIA REPAIR      No Known Allergies   Outpatient Medications Prior to Visit  Medication Sig Dispense Refill  . aspirin EC 81 MG tablet Take 1 tablet (81 mg total) by mouth daily. 30 tablet 3  . Blood Glucose Monitoring Suppl (ACCU-CHEK AVIVA PLUS) w/Device KIT USE AS DIRECTED 4 TIMES DAILY AFTER MEALS AND AT BEDTIME. 1 kit 0  . glucose blood (ACCU-CHEK AVIVA) test strip Use as instructed 3 times daily. E11.9 100 each 12  . Insulin Pen Needle 31G X 5 MM MISC 1 each by Does not apply route at bedtime. 30 each 5  . Lancet Devices (ACCU-CHEK SOFTCLIX) lancets Use as instructed 1 each 12  . MICROLET LANCETS MISC Test 3 times per day 100 each 12  . amLODipine (NORVASC) 10 MG tablet Take 1 tablet (10 mg  total) by mouth daily. 30 tablet 5  . diclofenac (VOLTAREN) 75 MG EC tablet TAKE 1 TABLET BY MOUTH 2 TIMES DAILY AS NEEDED FOR MODERATE PAIN. 60 tablet 1  . glipiZIDE (GLIPIZIDE XL) 10 MG 24 hr tablet Take 2 tablets (20 mg total) by mouth daily with breakfast. 60 tablet 5  . Insulin Glargine (LANTUS SOLOSTAR) 100 UNIT/ML Solostar Pen Inject 10 Units into the skin daily at 10 pm. 3 pen 5  . JANUVIA 100 MG tablet TAKE 1 TABLET BY MOUTH DAILY. 30 tablet 0  . losartan-hydrochlorothiazide (HYZAAR) 50-12.5 MG tablet Take 1 tablet by mouth daily. 30 tablet 5  . metFORMIN (GLUCOPHAGE) 1000 MG tablet TAKE 1.5 TABLET ('1500MG'$ ) BY MOUTH IN THE MORNING AND 1 TABLET ('1000MG'$ ) IN THE EVENING. 75 tablet 5  . pravastatin (PRAVACHOL) 40 MG tablet Take 1 tablet (40 mg total) by mouth daily. 30 tablet 5   Facility-Administered Medications Prior to Visit  Medication Dose Route Frequency Provider Last Rate Last Dose  . 0.9 %  sodium chloride infusion  500 mL Intravenous Continuous Danis, Estill Cotta III, MD        ROS Review of Systems  Constitutional: Negative for activity change and appetite change.  HENT: Negative for sinus pressure and sore throat.   Eyes: Negative for visual disturbance.  Respiratory: Negative for cough, chest tightness and shortness of breath.   Cardiovascular: Negative for chest pain and leg swelling.  Gastrointestinal: Negative for abdominal distention, abdominal pain, constipation and diarrhea.  Endocrine: Negative.   Genitourinary: Negative for dysuria.  Musculoskeletal: Negative for joint swelling and myalgias.  Skin: Negative for rash.  Allergic/Immunologic: Negative.   Neurological: Negative for weakness, light-headedness and numbness.  Psychiatric/Behavioral: Negative for dysphoric mood and suicidal ideas.    Objective:  BP 121/67   Pulse 87   Temp 98.2 F (36.8 C) (Oral)   Ht '5\' 4"'$  (1.626 m)   Wt 148 lb 12.8 oz (67.5 kg)   SpO2 98%   BMI 25.54 kg/m   BP/Weight 10/10/2017  07/11/2017 56/08/5636  Systolic BP 937 342 876  Diastolic BP 67 70 72  Wt. (Lbs) 148.8 144.4 144.4  BMI 25.54 24.79 24.79      Physical Exam  Constitutional: He is oriented to person, place, and time. He appears well-developed and well-nourished.  Cardiovascular: Normal rate, normal heart sounds and intact distal pulses.  No murmur heard. Pulmonary/Chest: Effort normal and breath sounds normal. He has no wheezes. He has no rales. He exhibits no tenderness.  Abdominal: Soft. Bowel sounds are normal. He exhibits no distension and no mass. There is no tenderness.  Musculoskeletal: Normal range of motion.  Neurological: He is alert and oriented to person, place, and time.  Skin: Skin is warm and dry.  Psychiatric: He has a normal mood and affect.     Lab Results  Component Value Date   HGBA1C 9.6 10/10/2017    Assessment & Plan:   1. Type 2 diabetes mellitus without complication, without long-term current use of insulin (HCC) Uncontrolled with A1c of 9.6 which has trended up from 8.5 previously Not compliant with current dose of Lantus; I have increased it from 10 to15 units and encourage compliance Switched from Januvia to [place]. Diabetic diet, lifestyle modifications - POCT glucose (manual entry) - POCT glycosylated hemoglobin (Hb A1C) - Insulin Glargine (LANTUS SOLOSTAR) 100 UNIT/ML Solostar Pen; Inject 15 Units into the skin daily at 10 pm.  Dispense: 3 pen; Refill: 5 - CMP14+EGFR - Lipid panel - Microalbumin/Creatinine Ratio, Urine - Ambulatory referral to Ophthalmology - glipiZIDE (GLIPIZIDE XL) 10 MG 24 hr tablet; Take 2 tablets (20 mg total) by mouth daily with breakfast.  Dispense: 60 tablet; Refill: 5 - metFORMIN (GLUCOPHAGE) 1000 MG tablet; TAKE 1.5 TABLET ('1500MG'$ ) BY MOUTH IN THE MORNING AND 1 TABLET ('1000MG'$ ) IN THE EVENING.  Dispense: 75 tablet; Refill: 5 - canagliflozin (INVOKANA) 100 MG TABS tablet; Take 1 tablet (100 mg total) by mouth daily before breakfast.   Dispense: 30 tablet; Refill: 6  2. Essential Hypertension  Controlled Low-sodium diet - amLODipine (NORVASC) 10 MG tablet; Take 1 tablet (10 mg total) by mouth daily.  Dispense: 30 tablet; Refill: 5 - losartan-hydrochlorothiazide (HYZAAR) 50-12.5 MG tablet; Take 1 tablet by mouth daily.  Dispense: 30 tablet; Refill: 5  3. Arthritis pain, hand Stable - diclofenac (VOLTAREN) 75 MG EC tablet; TAKE 1 TABLET BY MOUTH 2 TIMES DAILY AS NEEDED FOR MODERATE PAIN.  Dispense: 60 tablet; Refill: 1  4. Pure hypercholesterolemia Controlled Low-cholesterol diet - pravastatin (PRAVACHOL) 40 MG tablet; Take 1 tablet (40 mg total) by mouth daily.  Dispense: 30 tablet; Refill: 5  5. Need for hepatitis C screening test - Hepatitis c antibody (reflex)   Meds ordered this encounter  Medications  . Insulin Glargine (LANTUS SOLOSTAR) 100 UNIT/ML Solostar Pen  Sig: Inject 15 Units into the skin daily at 10 pm.    Dispense:  3 pen    Refill:  5    Discontinue previous dose  . amLODipine (NORVASC) 10 MG tablet    Sig: Take 1 tablet (10 mg total) by mouth daily.    Dispense:  30 tablet    Refill:  5  . diclofenac (VOLTAREN) 75 MG EC tablet    Sig: TAKE 1 TABLET BY MOUTH 2 TIMES DAILY AS NEEDED FOR MODERATE PAIN.    Dispense:  60 tablet    Refill:  1  . glipiZIDE (GLIPIZIDE XL) 10 MG 24 hr tablet    Sig: Take 2 tablets (20 mg total) by mouth daily with breakfast.    Dispense:  60 tablet    Refill:  5  . losartan-hydrochlorothiazide (HYZAAR) 50-12.5 MG tablet    Sig: Take 1 tablet by mouth daily.    Dispense:  30 tablet    Refill:  5  . metFORMIN (GLUCOPHAGE) 1000 MG tablet    Sig: TAKE 1.5 TABLET ('1500MG'$ ) BY MOUTH IN THE MORNING AND 1 TABLET ('1000MG'$ ) IN THE EVENING.    Dispense:  75 tablet    Refill:  5  . pravastatin (PRAVACHOL) 40 MG tablet    Sig: Take 1 tablet (40 mg total) by mouth daily.    Dispense:  30 tablet    Refill:  5  . canagliflozin (INVOKANA) 100 MG TABS tablet    Sig: Take  1 tablet (100 mg total) by mouth daily before breakfast.    Dispense:  30 tablet    Refill:  6    Discontinue Januvia    Follow-up: Return in about 3 months (around 01/09/2018) for annual wellness exam.   Charlott Rakes MD

## 2017-10-11 LAB — CMP14+EGFR
ALT: 21 IU/L (ref 0–44)
AST: 24 IU/L (ref 0–40)
Albumin/Globulin Ratio: 1.7 (ref 1.2–2.2)
Albumin: 4.7 g/dL (ref 3.6–4.8)
Alkaline Phosphatase: 103 IU/L (ref 39–117)
BUN/Creatinine Ratio: 24 (ref 10–24)
BUN: 24 mg/dL (ref 8–27)
Bilirubin Total: 0.2 mg/dL (ref 0.0–1.2)
CO2: 20 mmol/L (ref 20–29)
Calcium: 10.1 mg/dL (ref 8.6–10.2)
Chloride: 105 mmol/L (ref 96–106)
Creatinine, Ser: 1.01 mg/dL (ref 0.76–1.27)
GFR calc Af Amer: 88 mL/min/{1.73_m2} (ref >59)
GFR calc non Af Amer: 76 mL/min/{1.73_m2} (ref >59)
Globulin, Total: 2.8 g/dL (ref 1.5–4.5)
Glucose: 126 mg/dL — ABNORMAL HIGH (ref 65–99)
Potassium: 4.6 mmol/L (ref 3.5–5.2)
Sodium: 142 mmol/L (ref 134–144)
Total Protein: 7.5 g/dL (ref 6.0–8.5)

## 2017-10-11 LAB — HCV COMMENT:

## 2017-10-11 LAB — MICROALBUMIN / CREATININE URINE RATIO
Creatinine, Urine: 128.2 mg/dL
Microalb/Creat Ratio: 6.4 mg/g creat (ref 0.0–30.0)
Microalbumin, Urine: 8.2 ug/mL

## 2017-10-11 LAB — LIPID PANEL
Chol/HDL Ratio: 3.5 ratio (ref 0.0–5.0)
Cholesterol, Total: 171 mg/dL (ref 100–199)
HDL: 49 mg/dL (ref >39)
LDL Calculated: 108 mg/dL — ABNORMAL HIGH (ref 0–99)
Triglycerides: 72 mg/dL (ref 0–149)
VLDL Cholesterol Cal: 14 mg/dL (ref 5–40)

## 2017-10-11 LAB — HEPATITIS C ANTIBODY (REFLEX): HCV Ab: <0.1 s/co ratio (ref 0.0–0.9)

## 2017-10-12 ENCOUNTER — Telehealth: Payer: Self-pay

## 2017-10-12 NOTE — Telephone Encounter (Signed)
Patient was called and there is no voicemail set up to leave a message.   If patient call for lab results please inform patient that labs are normal and Hep C is negative.

## 2017-10-22 MED FILL — metFORMIN HCL 1000 MG TABS: 1000 | 30 days supply | Qty: 75 | Fill #3

## 2017-10-23 ENCOUNTER — Telehealth: Payer: Self-pay | Admitting: Family Medicine

## 2017-10-23 NOTE — Telephone Encounter (Signed)
Pt called back, verified DOB and received Note left by his nurse

## 2017-10-25 NOTE — Telephone Encounter (Signed)
Noted  

## 2017-10-29 MED FILL — AMLODIPINE BESYLATE 10 MG T: 10 | 30 days supply | Qty: 30 | Fill #5

## 2017-10-29 MED FILL — LOSARTAN-HCTZ 50-12.5 MG TA: 50-12.5 | 30 days supply | Qty: 30 | Fill #5

## 2017-10-29 MED FILL — PRAVASTATIN NA 40 MG TAB: 40 | 30 days supply | Qty: 30 | Fill #0

## 2017-11-08 MED FILL — INVOKANA 100 MG TABLET: 100 | 30 days supply | Qty: 30 | Fill #1

## 2017-11-19 MED FILL — glipiZIDE ER 10 MG TB24: 10 | 30 days supply | Qty: 60 | Fill #1

## 2017-11-19 MED FILL — LANTUS SOLOSTAR 100 UNITS/M: 100 | 28 days supply | Qty: 3 | Fill #5

## 2017-11-19 MED FILL — DICLOFENAC SODIUM 75 MG TAB: 75 | 30 days supply | Qty: 60 | Fill #1

## 2017-11-20 MED FILL — metFORMIN HCL 1000 MG TABS: 1000 | 30 days supply | Qty: 75 | Fill #4

## 2017-11-26 MED FILL — PRAVASTATIN NA 40 MG TAB: 40 | 30 days supply | Qty: 30 | Fill #1

## 2017-11-26 MED FILL — AMLODIPINE BESYLATE 10 MG T: 10 | 30 days supply | Qty: 30 | Fill #0

## 2017-11-26 MED FILL — LOSARTAN-HCTZ 50-12.5 MG TA: 50-12.5 | 30 days supply | Qty: 30 | Fill #0

## 2017-12-14 MED FILL — INVOKANA 100 MG TABLET: 100 | 30 days supply | Qty: 30 | Fill #2

## 2017-12-17 MED FILL — LANTUS SOLOSTAR 100 UNITS/M: 100 | 30 days supply | Qty: 3 | Fill #0

## 2017-12-24 MED FILL — glipiZIDE XL 10 MG TB24: 10 | 30 days supply | Qty: 60 | Fill #2

## 2017-12-24 MED FILL — LOSARTAN-HCTZ 50-12.5 MG TA: 50-12.5 | 30 days supply | Qty: 30 | Fill #1

## 2017-12-24 MED FILL — metFORMIN HCL 1000 MG TABS: 1000 | 30 days supply | Qty: 75 | Fill #5

## 2017-12-24 MED FILL — DICLOFENAC SOD EC 75 MG TAB: 75 | 30 days supply | Qty: 60 | Fill #1

## 2017-12-24 MED FILL — PRAVASTATIN NA 40 MG TAB: 40 | 30 days supply | Qty: 30 | Fill #2

## 2017-12-24 MED FILL — AMLODIPINE BESYLATE 10 MG T: 10 | 30 days supply | Qty: 30 | Fill #1

## 2017-12-31 MED FILL — TRUEPLUS PEN NDL 31GX3/16: 31G X 5 MM | 30 days supply | Qty: 100 | Fill #2

## 2017-12-31 MED FILL — TRUEPLUS PEN NDL 31GX3/16": 31G X 5 MM | 30 days supply | Qty: 100 | Fill #2

## 2018-01-09 ENCOUNTER — Ambulatory Visit: Payer: Medicare Other | Admitting: Family Medicine

## 2018-01-09 ENCOUNTER — Telehealth: Payer: Self-pay | Admitting: Family Medicine

## 2018-01-09 NOTE — Telephone Encounter (Signed)
Error

## 2018-01-21 MED FILL — DICLOFENAC SOD EC 75 MG TAB: 75 | 30 days supply | Qty: 60 | Fill #2

## 2018-01-21 MED FILL — glipiZIDE XL 10 MG TB24: 10 | 30 days supply | Qty: 60 | Fill #3

## 2018-01-21 MED FILL — metFORMIN HCL 1000 MG TABS: 1000 | 30 days supply | Qty: 75 | Fill #0

## 2018-01-28 MED FILL — AMLODIPINE BESYLATE 10 MG T: 10 | 30 days supply | Qty: 30 | Fill #2

## 2018-01-28 MED FILL — PRAVASTATIN NA 40 MG TAB: 40 | 30 days supply | Qty: 30 | Fill #3

## 2018-01-28 MED FILL — LOSARTAN-HCTZ 50-12.5 MG TA: 50-12.5 | 30 days supply | Qty: 30 | Fill #2

## 2018-02-18 MED FILL — metFORMIN HCL 1000 MG TABS: 1000 | 30 days supply | Qty: 75 | Fill #1

## 2018-02-18 MED FILL — glipiZIDE XL 10 MG TB24: 10 | 30 days supply | Qty: 60 | Fill #4

## 2018-02-21 ENCOUNTER — Encounter: Payer: Self-pay | Admitting: Family Medicine

## 2018-02-21 ENCOUNTER — Ambulatory Visit: Payer: Medicare Other | Attending: Family Medicine | Admitting: Family Medicine

## 2018-02-21 VITALS — BP 145/72 | HR 92 | Temp 98.5°F | Ht 64.0 in | Wt 142.0 lb

## 2018-02-21 DIAGNOSIS — Z794 Long term (current) use of insulin: Secondary | ICD-10-CM | POA: Insufficient documentation

## 2018-02-21 DIAGNOSIS — Z79899 Other long term (current) drug therapy: Secondary | ICD-10-CM | POA: Diagnosis not present

## 2018-02-21 DIAGNOSIS — E785 Hyperlipidemia, unspecified: Secondary | ICD-10-CM | POA: Diagnosis not present

## 2018-02-21 DIAGNOSIS — I10 Essential (primary) hypertension: Secondary | ICD-10-CM | POA: Insufficient documentation

## 2018-02-21 DIAGNOSIS — E119 Type 2 diabetes mellitus without complications: Secondary | ICD-10-CM | POA: Diagnosis not present

## 2018-02-21 DIAGNOSIS — E7800 Pure hypercholesterolemia, unspecified: Secondary | ICD-10-CM

## 2018-02-21 DIAGNOSIS — Z23 Encounter for immunization: Secondary | ICD-10-CM

## 2018-02-21 DIAGNOSIS — Z7982 Long term (current) use of aspirin: Secondary | ICD-10-CM | POA: Diagnosis not present

## 2018-02-21 LAB — POCT GLYCOSYLATED HEMOGLOBIN (HGB A1C): HbA1c, POC (controlled diabetic range): 8 % — AB (ref 0.0–7.0)

## 2018-02-21 LAB — GLUCOSE, POCT (MANUAL RESULT ENTRY): POC Glucose: 103 mg/dl — AB (ref 70–99)

## 2018-02-21 MED ORDER — INSULIN GLARGINE 100 UNIT/ML SOLOSTAR PEN: 15.0000 [IU] | PEN_INJECTOR | Freq: Every day | SUBCUTANEOUS | 5 refills | Status: DC

## 2018-02-21 MED ORDER — CANAGLIFLOZIN 100 MG PO TABS: 100.0000 mg | ORAL_TABLET | Freq: Every day | ORAL | 6 refills | Status: DC

## 2018-02-21 MED ORDER — TETANUS-DIPHTH-ACELL PERTUSSIS 5-2.5-18.5 LF-MCG/0.5 IM SUSP: 0.5000 mL | Freq: Once | INTRAMUSCULAR | 0 refills | Status: AC

## 2018-02-21 MED ORDER — AMLODIPINE BESYLATE 10 MG PO TABS: 10.0000 mg | ORAL_TABLET | Freq: Every day | ORAL | 5 refills | Status: DC

## 2018-02-21 MED ORDER — GLIPIZIDE ER 10 MG PO TB24: 20.0000 mg | ORAL_TABLET | Freq: Every day | ORAL | 5 refills | Status: DC

## 2018-02-21 MED ORDER — SILDENAFIL CITRATE 50 MG PO TABS: 50.0000 mg | ORAL_TABLET | Freq: Every day | ORAL | 1 refills | Status: AC | PRN

## 2018-02-21 MED ORDER — PRAVASTATIN SODIUM 40 MG PO TABS: 40.0000 mg | ORAL_TABLET | Freq: Every day | ORAL | 5 refills | Status: DC

## 2018-02-21 MED ORDER — METFORMIN HCL 1000 MG PO TABS: ORAL_TABLET | ORAL | 5 refills | Status: DC

## 2018-02-21 MED ORDER — LOSARTAN POTASSIUM-HCTZ 50-12.5 MG PO TABS: 1.0000 | ORAL_TABLET | Freq: Every day | ORAL | 5 refills | Status: DC

## 2018-02-21 MED FILL — AMLODIPINE BESYLATE 10 MG T: 10 | 30 days supply | Qty: 30 | Fill #0

## 2018-02-21 MED FILL — PRAVASTATIN NA 40 MG TAB: 40 | 30 days supply | Qty: 30 | Fill #0

## 2018-02-21 MED FILL — LANTUS SOLOSTAR 100 UNITS/M: 100 | 30 days supply | Qty: 3 | Fill #1

## 2018-02-21 MED FILL — INVOKANA 100 MG TABLET: 100 | 30 days supply | Qty: 30 | Fill #3

## 2018-02-21 MED FILL — LOSARTAN-HCTZ 50-12.5 MG TA: 50-12.5 | 30 days supply | Qty: 30 | Fill #0

## 2018-02-21 NOTE — Progress Notes (Signed)
Subjective:  Patient ID: Jerry Serrano, male    DOB: 1951/04/26  Age: 67 y.o. MRN: [id number]  CC: Annual Exam   HPI Jerry Serrano is a 67 year old male with history of hypertension, hyperlipidemia, type 2 diabetes mellitus (A1c 8.0) who presents today for follow-up visit. His A1c  Is  8.0  Which has decreased from 9.6 previously and he endorses non compliance with Invokana and Lantus for the last 2 weeks due to the cost as he has to pay $180 per month for both medications. He denies hypoglycemia, visual concerns or numbness in his extremities. He  Is doing well on his antihypertensive and statin and denies adverse effects from his medications. He requests samples of Viagra. Denies acute concerns at this time.  Past Medical History:  Diagnosis Date  . Allergy   . Arthritis   . Diabetes mellitus   . Hyperlipidemia   . Hypertension     Past Surgical History:  Procedure Laterality Date  . HERNIA REPAIR      No Known Allergies    Outpatient Medications Prior to Visit  Medication Sig Dispense Refill  . amLODipine (NORVASC) 10 MG tablet Take 1 tablet (10 mg total) by mouth daily. 30 tablet 5  . aspirin EC 81 MG tablet Take 1 tablet (81 mg total) by mouth daily. 30 tablet 3  . Blood Glucose Monitoring Suppl (ACCU-CHEK AVIVA PLUS) w/Device KIT USE AS DIRECTED 4 TIMES DAILY AFTER MEALS AND AT BEDTIME. 1 kit 0  . canagliflozin (INVOKANA) 100 MG TABS tablet Take 1 tablet (100 mg total) by mouth daily before breakfast. 30 tablet 6  . glipiZIDE (GLIPIZIDE XL) 10 MG 24 hr tablet Take 2 tablets (20 mg total) by mouth daily with breakfast. 60 tablet 5  . glucose blood (ACCU-CHEK AVIVA) test strip Use as instructed 3 times daily. E11.9 100 each 12  . Insulin Pen Needle 31G X 5 MM MISC 1 each by Does not apply route at bedtime. 30 each 5  . Lancet Devices (ACCU-CHEK SOFTCLIX) lancets Use as instructed 1 each 12  . losartan-hydrochlorothiazide (HYZAAR) 50-12.5 MG tablet Take 1 tablet by  mouth daily. 30 tablet 5  . metFORMIN (GLUCOPHAGE) 1000 MG tablet TAKE 1.5 TABLET ('1500MG'$ ) BY MOUTH IN THE MORNING AND 1 TABLET ('1000MG'$ ) IN THE EVENING. 75 tablet 5  . MICROLET LANCETS MISC Test 3 times per day 100 each 12  . pravastatin (PRAVACHOL) 40 MG tablet Take 1 tablet (40 mg total) by mouth daily. 30 tablet 5  . diclofenac (VOLTAREN) 75 MG EC tablet TAKE 1 TABLET BY MOUTH 2 TIMES DAILY AS NEEDED FOR MODERATE PAIN. (Patient not taking: Reported on 02/21/2018) 60 tablet 1  . Insulin Glargine (LANTUS SOLOSTAR) 100 UNIT/ML Solostar Pen Inject 15 Units into the skin daily at 10 pm. (Patient not taking: Reported on 02/21/2018) 3 pen 5   Facility-Administered Medications Prior to Visit  Medication Dose Route Frequency Provider Last Rate Last Dose  . 0.9 %  sodium chloride infusion  500 mL Intravenous Continuous Danis, Estill Cotta III, MD        ROS Review of Systems  Constitutional: Negative for activity change and appetite change.  HENT: Negative for sinus pressure and sore throat.   Eyes: Negative for visual disturbance.  Respiratory: Negative for cough, chest tightness and shortness of breath.   Cardiovascular: Negative for chest pain and leg swelling.  Gastrointestinal: Negative for abdominal distention, abdominal pain, constipation and diarrhea.  Endocrine: Negative.   Genitourinary: Negative  for dysuria.  Musculoskeletal: Negative for joint swelling and myalgias.  Skin: Negative for rash.  Allergic/Immunologic: Negative.   Neurological: Negative for weakness, light-headedness and numbness.  Psychiatric/Behavioral: Negative for dysphoric mood and suicidal ideas.    Objective:  BP (!) 145/72   Pulse 92   Temp 98.5 F (36.9 C) (Oral)   Ht '5\' 4"'$  (1.626 m)   Wt 142 lb (64.4 kg)   SpO2 98%   BMI 24.37 kg/m   BP/Weight 02/21/2018 07/16/7937 0/09/90  Systolic BP 330 076 226  Diastolic BP 72 67 70  Wt. (Lbs) 142 148.8 144.4  BMI 24.37 25.54 24.79      Physical Exam    Constitutional: He is oriented to person, place, and time. He appears well-developed and well-nourished.  Cardiovascular: Normal rate, normal heart sounds and intact distal pulses.  No murmur heard. Pulmonary/Chest: Effort normal and breath sounds normal. He has no wheezes. He has no rales. He exhibits no tenderness.  Abdominal: Soft. Bowel sounds are normal. He exhibits no distension and no mass. There is no tenderness.  Musculoskeletal: Normal range of motion.  Neurological: He is alert and oriented to person, place, and time.  Skin: Skin is warm and dry.  Psychiatric: He has a normal mood and affect.     CMP Latest Ref Rng & Units 10/10/2017 04/10/2017 09/05/2016  Glucose 65 - 99 mg/dL 126(H) 144(H) 168(H)  BUN 8 - 27 mg/dL '24 15 14  '$ Creatinine 0.76 - 1.27 mg/dL 1.01 0.81 0.90  Sodium 134 - 144 mmol/L 142 137 136  Potassium 3.5 - 5.2 mmol/L 4.6 4.7 4.2  Chloride 96 - 106 mmol/L 105 101 103  CO2 20 - 29 mmol/L '20 23 25  '$ Calcium 8.6 - 10.2 mg/dL 10.1 9.8 9.6  Total Protein 6.0 - 8.5 g/dL 7.5 6.8 7.6  Total Bilirubin 0.0 - 1.2 mg/dL 0.2 0.4 0.5  Alkaline Phos 39 - 117 IU/L 103 64 78  AST 0 - 40 IU/L '24 18 22  '$ ALT 0 - 44 IU/L '21 21 23    '$ Lipid Panel     Component Value Date/Time   CHOL 171 10/10/2017 1233   TRIG 72 10/10/2017 1233   HDL 49 10/10/2017 1233   CHOLHDL 3.5 10/10/2017 1233   CHOLHDL 2.8 02/29/2016 1018   VLDL 12 02/29/2016 1018   LDLCALC 108 (H) 10/10/2017 1233    Lab Results  Component Value Date   HGBA1C 8.0 (A) 02/21/2018    Assessment & Plan:   1. Type 2 diabetes mellitus without complication, with long-term current use of insulin (HCC) A1c of 8.0 which has improved from 9.6 previously He is having difficulty obtaining his medications due to high co-pay The pharmacy will work with his insurance company to find a means of obtaining his medications at a low cost Counseled on Diabetic diet, my plate method, 333 minutes of moderate intensity  exercise/week Keep blood sugar logs with fasting goals of 80-120 mg/dl, random of less than 180 and in the event of sugars less than 60 mg/dl or greater than 400 mg/dl please notify the clinic ASAP. It is recommended that you undergo annual eye exams and annual foot exams. Pneumonia vaccine is recommended. - POCT glucose (manual entry) - POCT glycosylated hemoglobin (Hb A1C)  2. Pure hypercholesterolemia Controlled Low-cholesterol diet  3. Essential hypertension Slightly elevated No regimen change today Counseled on blood pressure goal of less than 130/80, low-sodium, DASH diet, medication compliance, 150 minutes of moderate intensity exercise per week. Discussed  medication compliance, adverse effects.    No orders of the defined types were placed in this encounter.   Follow-up: Return in about 3 months (around 05/24/2018) for Follow-up of diabetes mellitus.   Charlott Rakes MD

## 2018-02-21 NOTE — Patient Instructions (Signed)
Diabetes Mellitus and Nutrition When you have diabetes (diabetes mellitus), it is very important to have healthy eating habits because your blood sugar (glucose) levels are greatly affected by what you eat and drink. Eating healthy foods in the appropriate amounts, at about the same times every day, can help you:  Control your blood glucose.  Lower your risk of heart disease.  Improve your blood pressure.  Reach or maintain a healthy weight.  Every person with diabetes is different, and each person has different needs for a meal plan. Your health care provider may recommend that you work with a diet and nutrition specialist (dietitian) to make a meal plan that is best for you. Your meal plan may vary depending on factors such as:  The calories you need.  The medicines you take.  Your weight.  Your blood glucose, blood pressure, and cholesterol levels.  Your activity level.  Other health conditions you have, such as heart or kidney disease.  How do carbohydrates affect me? Carbohydrates affect your blood glucose level more than any other type of food. Eating carbohydrates naturally increases the amount of glucose in your blood. Carbohydrate counting is a method for keeping track of how many carbohydrates you eat. Counting carbohydrates is important to keep your blood glucose at a healthy level, especially if you use insulin or take certain oral diabetes medicines. It is important to know how many carbohydrates you can safely have in each meal. This is different for every person. Your dietitian can help you calculate how many carbohydrates you should have at each meal and for snack. Foods that contain carbohydrates include:  Bread, cereal, rice, pasta, and crackers.  Potatoes and corn.  Peas, beans, and lentils.  Milk and yogurt.  Fruit and juice.  Desserts, such as cakes, cookies, ice cream, and candy.  How does alcohol affect me? Alcohol can cause a sudden decrease in blood  glucose (hypoglycemia), especially if you use insulin or take certain oral diabetes medicines. Hypoglycemia can be a life-threatening condition. Symptoms of hypoglycemia (sleepiness, dizziness, and confusion) are similar to symptoms of having too much alcohol. If your health care provider says that alcohol is safe for you, follow these guidelines:  Limit alcohol intake to no more than 1 drink per day for nonpregnant women and 2 drinks per day for men. One drink equals 12 oz of beer, 5 oz of wine, or 1 oz of hard liquor.  Do not drink on an empty stomach.  Keep yourself hydrated with water, diet soda, or unsweetened iced tea.  Keep in mind that regular soda, juice, and other mixers may contain a lot of sugar and must be counted as carbohydrates.  What are tips for following this plan? Reading food labels  Start by checking the serving size on the label. The amount of calories, carbohydrates, fats, and other nutrients listed on the label are based on one serving of the food. Many foods contain more than one serving per package.  Check the total grams (g) of carbohydrates in one serving. You can calculate the number of servings of carbohydrates in one serving by dividing the total carbohydrates by 15. For example, if a food has 30 g of total carbohydrates, it would be equal to 2 servings of carbohydrates.  Check the number of grams (g) of saturated and trans fats in one serving. Choose foods that have low or no amount of these fats.  Check the number of milligrams (mg) of sodium in one serving. Most people   should limit total sodium intake to less than 2,300 mg per day.  Always check the nutrition information of foods labeled as "low-fat" or "nonfat". These foods may be higher in added sugar or refined carbohydrates and should be avoided.  Talk to your dietitian to identify your daily goals for nutrients listed on the label. Shopping  Avoid buying canned, premade, or processed foods. These  foods tend to be high in fat, sodium, and added sugar.  Shop around the outside edge of the grocery store. This includes fresh fruits and vegetables, bulk grains, fresh meats, and fresh dairy. Cooking  Use low-heat cooking methods, such as baking, instead of high-heat cooking methods like deep frying.  Cook using healthy oils, such as olive, canola, or sunflower oil.  Avoid cooking with butter, cream, or high-fat meats. Meal planning  Eat meals and snacks regularly, preferably at the same times every day. Avoid going long periods of time without eating.  Eat foods high in fiber, such as fresh fruits, vegetables, beans, and whole grains. Talk to your dietitian about how many servings of carbohydrates you can eat at each meal.  Eat 4-6 ounces of lean protein each day, such as lean meat, chicken, fish, eggs, or tofu. 1 ounce is equal to 1 ounce of meat, chicken, or fish, 1 egg, or 1/4 cup of tofu.  Eat some foods each day that contain healthy fats, such as avocado, nuts, seeds, and fish. Lifestyle   Check your blood glucose regularly.  Exercise at least 30 minutes 5 or more days each week, or as told by your health care provider.  Take medicines as told by your health care provider.  Do not use any products that contain nicotine or tobacco, such as cigarettes and e-cigarettes. If you need help quitting, ask your health care provider.  Work with a counselor or diabetes educator to identify strategies to manage stress and any emotional and social challenges. What are some questions to ask my health care provider?  Do I need to meet with a diabetes educator?  Do I need to meet with a dietitian?  What number can I call if I have questions?  When are the best times to check my blood glucose? Where to find more information:  American Diabetes Association: diabetes.org/food-and-fitness/food  Academy of Nutrition and Dietetics:  www.eatright.org/resources/health/diseases-and-conditions/diabetes  National Institute of Diabetes and Digestive and Kidney Diseases (NIH): www.niddk.nih.gov/health-information/diabetes/overview/diet-eating-physical-activity Summary  A healthy meal plan will help you control your blood glucose and maintain a healthy lifestyle.  Working with a diet and nutrition specialist (dietitian) can help you make a meal plan that is best for you.  Keep in mind that carbohydrates and alcohol have immediate effects on your blood glucose levels. It is important to count carbohydrates and to use alcohol carefully. This information is not intended to replace advice given to you by your health care provider. Make sure you discuss any questions you have with your health care provider. Document Released: 03/23/2005 Document Revised: 07/31/2016 Document Reviewed: 07/31/2016 Elsevier Interactive Patient Education  2018 Elsevier Inc.  

## 2018-02-21 NOTE — Progress Notes (Signed)
Patient has not been taking insulin due to cost.

## 2018-02-25 MED FILL — DICLOFENAC SOD EC 75 MG TAB: 75 | 30 days supply | Qty: 60 | Fill #3

## 2018-03-18 MED FILL — metFORMIN HCL 1000 MG TABS: 1000 | 30 days supply | Qty: 75 | Fill #2

## 2018-04-01 ENCOUNTER — Other Ambulatory Visit: Payer: Self-pay | Admitting: Family Medicine

## 2018-04-01 DIAGNOSIS — M19049 Primary osteoarthritis, unspecified hand: Secondary | ICD-10-CM

## 2018-04-01 MED FILL — glipiZIDE XL 10 MG TB24: 10 | 30 days supply | Qty: 60 | Fill #5

## 2018-04-01 MED FILL — LOSARTAN-HCTZ 50-12.5 MG TA: 50-12.5 | 30 days supply | Qty: 30 | Fill #1

## 2018-04-02 MED FILL — DICLOFENAC SOD EC 75 MG TAB: 75 | 30 days supply | Qty: 60 | Fill #0

## 2018-04-08 MED FILL — PRAVASTATIN NA 40 MG TAB: 40 | 30 days supply | Qty: 30 | Fill #1

## 2018-04-08 MED FILL — AMLODIPINE BESYLATE 10 MG T: 10 | 30 days supply | Qty: 30 | Fill #1

## 2018-04-29 ENCOUNTER — Other Ambulatory Visit: Payer: Self-pay | Admitting: Family Medicine

## 2018-04-29 DIAGNOSIS — M19049 Primary osteoarthritis, unspecified hand: Secondary | ICD-10-CM

## 2018-04-29 MED FILL — glipiZIDE XL 10 MG TB24: 10 | 30 days supply | Qty: 60 | Fill #3

## 2018-04-29 MED FILL — DICLOFENAC SOD EC 75 MG TAB: 75 | 30 days supply | Qty: 60 | Fill #0

## 2018-04-29 MED FILL — metFORMIN HCL 1000 MG TABS: 1000 | 30 days supply | Qty: 75 | Fill #3

## 2018-05-08 MED FILL — LOSARTAN-HCTZ 50-12.5 MG TA: 50-12.5 | 30 days supply | Qty: 30 | Fill #2

## 2018-05-08 MED FILL — AMLODIPINE BESYLATE 10 MG T: 10 | 30 days supply | Qty: 30 | Fill #2

## 2018-05-08 MED FILL — PRAVASTATIN NA 40 MG TAB: 40 | 30 days supply | Qty: 30 | Fill #2

## 2018-05-27 ENCOUNTER — Ambulatory Visit: Payer: Medicare Other | Admitting: Family Medicine

## 2018-06-03 MED FILL — metFORMIN HCL 1000 MG TABS: 1000 | 30 days supply | Qty: 75 | Fill #4

## 2018-06-03 MED FILL — glipiZIDE XL 10 MG TB24: 10 | 30 days supply | Qty: 60 | Fill #4

## 2018-06-03 MED FILL — LOSARTAN-HCTZ 50-12.5 MG TA: 50-12.5 | 30 days supply | Qty: 30 | Fill #3

## 2018-06-04 ENCOUNTER — Ambulatory Visit: Payer: Medicare Other | Attending: Family Medicine | Admitting: Family Medicine

## 2018-06-04 ENCOUNTER — Encounter: Payer: Self-pay | Admitting: Family Medicine

## 2018-06-04 VITALS — BP 143/77 | HR 74 | Temp 97.7°F | Ht 64.0 in | Wt 143.4 lb

## 2018-06-04 DIAGNOSIS — E1142 Type 2 diabetes mellitus with diabetic polyneuropathy: Secondary | ICD-10-CM | POA: Diagnosis not present

## 2018-06-04 DIAGNOSIS — Z794 Long term (current) use of insulin: Secondary | ICD-10-CM

## 2018-06-04 DIAGNOSIS — M1990 Unspecified osteoarthritis, unspecified site: Secondary | ICD-10-CM | POA: Diagnosis not present

## 2018-06-04 DIAGNOSIS — Z79899 Other long term (current) drug therapy: Secondary | ICD-10-CM | POA: Diagnosis not present

## 2018-06-04 DIAGNOSIS — Z23 Encounter for immunization: Secondary | ICD-10-CM | POA: Diagnosis not present

## 2018-06-04 DIAGNOSIS — Z7982 Long term (current) use of aspirin: Secondary | ICD-10-CM | POA: Insufficient documentation

## 2018-06-04 DIAGNOSIS — E7800 Pure hypercholesterolemia, unspecified: Secondary | ICD-10-CM

## 2018-06-04 DIAGNOSIS — I10 Essential (primary) hypertension: Secondary | ICD-10-CM

## 2018-06-04 LAB — GLUCOSE, POCT (MANUAL RESULT ENTRY): POC Glucose: 103 mg/dl — AB (ref 70–99)

## 2018-06-04 LAB — POCT GLYCOSYLATED HEMOGLOBIN (HGB A1C): Hemoglobin A1C: 8.3 % — AB (ref 4.0–5.6)

## 2018-06-04 MED ORDER — GABAPENTIN 300 MG PO CAPS: 300.0000 mg | ORAL_CAPSULE | Freq: Every day | ORAL | 3 refills | Status: DC

## 2018-06-04 MED ORDER — LIRAGLUTIDE 18 MG/3ML ~~LOC~~ SOPN: PEN_INJECTOR | SUBCUTANEOUS | 3 refills | Status: DC

## 2018-06-04 MED FILL — GABAPENTIN 300 MG CAPSULE: 300 | 30 days supply | Qty: 30 | Fill #0

## 2018-06-04 NOTE — Progress Notes (Signed)
Subjective:  Patient ID: Jerry Serrano, male    DOB: 05/12/1951  Age: 67 y.o. MRN: [id number]  CC: Diabetes   HPI Jerry Serrano  is a 67 year old male with history of hypertension, hyperlipidemia, type 2 diabetes mellitus (A1c 8.3) who presents today for follow-up visit. His A1c is 8.3 which has trended up from 8.0 previously despite his compliance with his medications, exercise but has not really been compliant with a diabetic diet.  He endorses some numbness in the tip of his fingers on his feet but this is not bothersome.  Denies visual concerns. Doing well on his antihypertensive and his statin and denies myalgias or other adverse effects. He has no additional concerns today and is willing to receive the flu shot.  Past Medical History:  Diagnosis Date  . Allergy   . Arthritis   . Diabetes mellitus   . Hyperlipidemia   . Hypertension     Past Surgical History:  Procedure Laterality Date  . HERNIA REPAIR      No Known Allergies   Outpatient Medications Prior to Visit  Medication Sig Dispense Refill  . amLODipine (NORVASC) 10 MG tablet Take 1 tablet (10 mg total) by mouth daily. 30 tablet 5  . aspirin EC 81 MG tablet Take 1 tablet (81 mg total) by mouth daily. 30 tablet 3  . Blood Glucose Monitoring Suppl (ACCU-CHEK AVIVA PLUS) w/Device KIT USE AS DIRECTED 4 TIMES DAILY AFTER MEALS AND AT BEDTIME. 1 kit 0  . diclofenac (VOLTAREN) 75 MG EC tablet TAKE 1 TABLET BY MOUTH 2 TIMES DAILY AS NEEDED FOR MODERATE PAIN. 60 tablet 0  . glipiZIDE (GLIPIZIDE XL) 10 MG 24 hr tablet Take 2 tablets (20 mg total) by mouth daily with breakfast. 60 tablet 5  . glucose blood (ACCU-CHEK AVIVA) test strip Use as instructed 3 times daily. E11.9 100 each 12  . Insulin Glargine (LANTUS SOLOSTAR) 100 UNIT/ML Solostar Pen Inject 15 Units into the skin daily at 10 pm. 3 pen 5  . Insulin Pen Needle 31G X 5 MM MISC 1 each by Does not apply route at bedtime. 30 each 5  . Lancet Devices (ACCU-CHEK  SOFTCLIX) lancets Use as instructed 1 each 12  . losartan-hydrochlorothiazide (HYZAAR) 50-12.5 MG tablet Take 1 tablet by mouth daily. 30 tablet 5  . metFORMIN (GLUCOPHAGE) 1000 MG tablet TAKE 1.5 TABLET ('1500MG'$ ) BY MOUTH IN THE MORNING AND 1 TABLET ('1000MG'$ ) IN THE EVENING. 75 tablet 5  . MICROLET LANCETS MISC Test 3 times per day 100 each 12  . pravastatin (PRAVACHOL) 40 MG tablet Take 1 tablet (40 mg total) by mouth daily. 30 tablet 5  . sildenafil (VIAGRA) 50 MG tablet Take 1 tablet (50 mg total) by mouth daily as needed for erectile dysfunction. 10 tablet 1  . canagliflozin (INVOKANA) 100 MG TABS tablet Take 1 tablet (100 mg total) by mouth daily before breakfast. 30 tablet 6   Facility-Administered Medications Prior to Visit  Medication Dose Route Frequency Provider Last Rate Last Dose  . 0.9 %  sodium chloride infusion  500 mL Intravenous Continuous Danis, Estill Cotta III, MD        ROS Review of Systems  Constitutional: Negative for activity change and appetite change.  HENT: Negative for sinus pressure and sore throat.   Eyes: Negative for visual disturbance.  Respiratory: Negative for cough, chest tightness and shortness of breath.   Cardiovascular: Negative for chest pain and leg swelling.  Gastrointestinal: Negative for abdominal distention, abdominal  pain, constipation and diarrhea.  Endocrine: Negative.   Genitourinary: Negative for dysuria.  Musculoskeletal: Negative for joint swelling and myalgias.  Skin: Negative for rash.  Allergic/Immunologic: Negative.   Neurological: Positive for numbness. Negative for weakness and light-headedness.  Psychiatric/Behavioral: Negative for dysphoric mood and suicidal ideas.    Objective:  BP (!) 143/77   Pulse 74   Temp 97.7 F (36.5 C) (Oral)   Ht '5\' 4"'$  (1.626 m)   Wt 143 lb 6.4 oz (65 kg)   SpO2 99%   BMI 24.61 kg/m   BP/Weight 06/04/2018 0/37/0488 02/15/1693  Systolic BP 503 888 280  Diastolic BP 77 72 67  Wt. (Lbs) 143.4 142  148.8  BMI 24.61 24.37 25.54      Physical Exam  Constitutional: He is oriented to person, place, and time. He appears well-developed and well-nourished.  Cardiovascular: Normal rate, normal heart sounds and intact distal pulses.  No murmur heard. Pulmonary/Chest: Effort normal and breath sounds normal. He has no wheezes. He has no rales. He exhibits no tenderness.  Abdominal: Soft. Bowel sounds are normal. He exhibits no distension and no mass. There is no tenderness.  Musculoskeletal: Normal range of motion.  Neurological: He is alert and oriented to person, place, and time.  Skin: Skin is warm and dry.  Psychiatric: He has a normal mood and affect.    CMP Latest Ref Rng & Units 10/10/2017 04/10/2017 09/05/2016  Glucose 65 - 99 mg/dL 126(H) 144(H) 168(H)  BUN 8 - 27 mg/dL '24 15 14  '$ Creatinine 0.76 - 1.27 mg/dL 1.01 0.81 0.90  Sodium 134 - 144 mmol/L 142 137 136  Potassium 3.5 - 5.2 mmol/L 4.6 4.7 4.2  Chloride 96 - 106 mmol/L 105 101 103  CO2 20 - 29 mmol/L '20 23 25  '$ Calcium 8.6 - 10.2 mg/dL 10.1 9.8 9.6  Total Protein 6.0 - 8.5 g/dL 7.5 6.8 7.6  Total Bilirubin 0.0 - 1.2 mg/dL 0.2 0.4 0.5  Alkaline Phos 39 - 117 IU/L 103 64 78  AST 0 - 40 IU/L '24 18 22  '$ ALT 0 - 44 IU/L '21 21 23     '$ Lipid Panel     Component Value Date/Time   CHOL 171 10/10/2017 1233   TRIG 72 10/10/2017 1233   HDL 49 10/10/2017 1233   CHOLHDL 3.5 10/10/2017 1233   CHOLHDL 2.8 02/29/2016 1018   VLDL 12 02/29/2016 1018   LDLCALC 108 (H) 10/10/2017 1233    Lab Results  Component Value Date   HGBA1C 8.3 (A) 06/04/2018    Assessment & Plan:   1. Type 2 diabetes mellitus with diabetic polyneuropathy, with long-term current use of insulin (HCC) Uncontrolled with A1c of 8.3 Victoza added to regimen Discontinue Invokana due to concerns about amputation Continue lifestyle modification, diabetic diet Clinical pharmacist called in to educate on administration of Victoza Commence gabapentin-discussed  sedating side effects - POCT glucose (manual entry) - POCT glycosylated hemoglobin (Hb A1C) - gabapentin (NEURONTIN) 300 MG capsule; Take 1 capsule (300 mg total) by mouth daily.  Dispense: 30 capsule; Refill: 3 - liraglutide (VICTOZA) 18 MG/3ML SOPN; Inject subcutaneously daily 0.6 mg for 1 week then 1.2 mg for 1 week then 1.8 mg thereafter  Dispense: 3 pen; Refill: 3  2. Pure hypercholesterolemia Controlled Continue statin Low-cholesterol diet  3. Essential hypertension Slightly above goal No regimen change today Counseled on blood pressure goal of less than 130/80, low-sodium, DASH diet, medication compliance, 150 minutes of moderate intensity exercise per week. Discussed  medication compliance, adverse effects. - Basic Metabolic Panel   Meds ordered this encounter  Medications  . gabapentin (NEURONTIN) 300 MG capsule    Sig: Take 1 capsule (300 mg total) by mouth daily.    Dispense:  30 capsule    Refill:  3  . liraglutide (VICTOZA) 18 MG/3ML SOPN    Sig: Inject subcutaneously daily 0.6 mg for 1 week then 1.2 mg for 1 week then 1.8 mg thereafter    Dispense:  3 pen    Refill:  3    Discontinue Invokana    Follow-up: Return in about 3 months (around 09/04/2018) for Follow-up of chronic medical conditions.   Charlott Rakes MD

## 2018-06-05 ENCOUNTER — Telehealth: Payer: Self-pay

## 2018-06-05 LAB — BASIC METABOLIC PANEL
BUN/Creatinine Ratio: 20 (ref 10–24)
BUN: 16 mg/dL (ref 8–27)
CO2: 21 mmol/L (ref 20–29)
Calcium: 10.1 mg/dL (ref 8.6–10.2)
Chloride: 103 mmol/L (ref 96–106)
Creatinine, Ser: 0.8 mg/dL (ref 0.76–1.27)
GFR calc Af Amer: 105 mL/min/{1.73_m2} (ref >59)
GFR calc non Af Amer: 91 mL/min/{1.73_m2} (ref >59)
Glucose: 100 mg/dL — ABNORMAL HIGH (ref 65–99)
Potassium: 4.3 mmol/L (ref 3.5–5.2)
Sodium: 140 mmol/L (ref 134–144)

## 2018-06-05 NOTE — Telephone Encounter (Signed)
-----   Message from Charlott Rakes, MD sent at 06/05/2018  1:58 PM EST ----- Please inform the patient that labs are normal. Thank you.

## 2018-06-05 NOTE — Telephone Encounter (Signed)
Patient was called and informed of lab results. 

## 2018-06-10 MED FILL — PRAVASTATIN NA 40 MG TAB: 40 | 30 days supply | Qty: 30 | Fill #3

## 2018-06-10 MED FILL — AMLODIPINE BESYLATE 10 MG T: 10 | 30 days supply | Qty: 30 | Fill #3

## 2018-06-18 ENCOUNTER — Other Ambulatory Visit: Payer: Self-pay | Admitting: Family Medicine

## 2018-06-18 DIAGNOSIS — M19049 Primary osteoarthritis, unspecified hand: Secondary | ICD-10-CM

## 2018-06-18 MED FILL — DICLOFENAC SOD EC 75 MG TAB: 75 | 30 days supply | Qty: 60 | Fill #0

## 2018-07-01 MED FILL — glipiZIDE XL 10 MG TB24: 10 | 30 days supply | Qty: 60 | Fill #5

## 2018-07-01 MED FILL — metFORMIN HCL 1000 MG TABS: 1000 | 30 days supply | Qty: 75 | Fill #5

## 2018-07-08 MED FILL — AMLODIPINE BESYLATE 10 MG T: 10 | 30 days supply | Qty: 30 | Fill #4

## 2018-07-08 MED FILL — LOSARTAN-HCTZ 50-12.5 MG TA: 50-12.5 | 30 days supply | Qty: 30 | Fill #4

## 2018-07-08 MED FILL — GABAPENTIN 300 MG CAPSULE: 300 | 30 days supply | Qty: 30 | Fill #1

## 2018-07-08 MED FILL — PRAVASTATIN NA 40 MG TAB: 40 | 30 days supply | Qty: 30 | Fill #4

## 2018-07-22 MED FILL — DICLOFENAC SOD EC 75 MG TAB: 75 | 30 days supply | Qty: 60 | Fill #1

## 2018-07-29 MED FILL — metFORMIN HCL 1000 MG TABS: 1000 | 30 days supply | Qty: 75 | Fill #0

## 2018-08-05 MED FILL — PRAVASTATIN NA 40 MG TAB: 40 | 30 days supply | Qty: 30 | Fill #5

## 2018-08-05 MED FILL — glipiZIDE XL 10 MG TB24: 10 | 30 days supply | Qty: 60 | Fill #0

## 2018-08-05 MED FILL — GABAPENTIN 300 MG CAPSULE: 300 | 30 days supply | Qty: 30 | Fill #2

## 2018-08-05 MED FILL — AMLODIPINE BESYLATE 10 MG T: 10 | 30 days supply | Qty: 30 | Fill #5

## 2018-08-12 MED FILL — LOSARTAN-HCTZ 50-12.5 MG TA: 50-12.5 | 30 days supply | Qty: 30 | Fill #5

## 2018-08-26 ENCOUNTER — Other Ambulatory Visit: Payer: Self-pay | Admitting: Family Medicine

## 2018-08-26 DIAGNOSIS — M19049 Primary osteoarthritis, unspecified hand: Secondary | ICD-10-CM

## 2018-08-26 MED FILL — metFORMIN HCL 1000 MG TABS: 1000 | 30 days supply | Qty: 75 | Fill #1

## 2018-08-27 MED FILL — DICLOFENAC SOD EC 75 MG TAB: 75 | 30 days supply | Qty: 60 | Fill #0

## 2018-08-30 MED FILL — LANTUS SOLOSTAR 100 UNITS/M: 100 | 20 days supply | Qty: 3 | Fill #0

## 2018-09-02 MED FILL — AMLODIPINE BESYLATE 10 MG T: 10 | 30 days supply | Qty: 30 | Fill #0

## 2018-09-02 MED FILL — GABAPENTIN 300 MG CAPSULE: 300 | 30 days supply | Qty: 30 | Fill #3

## 2018-09-02 MED FILL — glipiZIDE XL 10 MG TB24: 10 | 30 days supply | Qty: 60 | Fill #1

## 2018-09-04 ENCOUNTER — Ambulatory Visit: Payer: Medicare Other | Admitting: Family Medicine

## 2018-09-09 MED FILL — LOSARTAN-HCTZ 50-12.5 MG TA: 50-12.5 | 30 days supply | Qty: 30 | Fill #0

## 2018-09-09 MED FILL — PRAVASTATIN NA 40 MG TAB: 40 | 30 days supply | Qty: 30 | Fill #4

## 2018-09-24 ENCOUNTER — Other Ambulatory Visit: Payer: Self-pay

## 2018-09-24 ENCOUNTER — Ambulatory Visit: Payer: Medicare Other | Attending: Family Medicine | Admitting: Family Medicine

## 2018-09-24 ENCOUNTER — Encounter: Payer: Self-pay | Admitting: Family Medicine

## 2018-09-24 VITALS — BP 138/76 | HR 86 | Temp 97.8°F | Ht 64.0 in | Wt 144.0 lb

## 2018-09-24 DIAGNOSIS — Z794 Long term (current) use of insulin: Secondary | ICD-10-CM | POA: Diagnosis not present

## 2018-09-24 DIAGNOSIS — E119 Type 2 diabetes mellitus without complications: Secondary | ICD-10-CM

## 2018-09-24 DIAGNOSIS — E1142 Type 2 diabetes mellitus with diabetic polyneuropathy: Secondary | ICD-10-CM

## 2018-09-24 DIAGNOSIS — I10 Essential (primary) hypertension: Secondary | ICD-10-CM

## 2018-09-24 DIAGNOSIS — E7800 Pure hypercholesterolemia, unspecified: Secondary | ICD-10-CM

## 2018-09-24 LAB — POCT GLYCOSYLATED HEMOGLOBIN (HGB A1C): HbA1c, POC (controlled diabetic range): 11.2 % — AB (ref 0.0–7.0)

## 2018-09-24 LAB — GLUCOSE, POCT (MANUAL RESULT ENTRY): POC Glucose: 198 mg/dl — AB (ref 70–99)

## 2018-09-24 MED ORDER — LOSARTAN POTASSIUM-HCTZ 50-12.5 MG PO TABS: 1.0000 | ORAL_TABLET | Freq: Every day | ORAL | 5 refills | Status: DC

## 2018-09-24 MED ORDER — AMLODIPINE BESYLATE 10 MG PO TABS: 10.0000 mg | ORAL_TABLET | Freq: Every day | ORAL | 5 refills | Status: DC

## 2018-09-24 MED ORDER — GABAPENTIN 300 MG PO CAPS: 300.0000 mg | ORAL_CAPSULE | Freq: Every day | ORAL | 5 refills | Status: DC

## 2018-09-24 MED ORDER — PANTOPRAZOLE SODIUM 40 MG PO TBEC: 40.0000 mg | DELAYED_RELEASE_TABLET | Freq: Every day | ORAL | 5 refills | Status: DC

## 2018-09-24 MED ORDER — PRAVASTATIN SODIUM 40 MG PO TABS: 40.0000 mg | ORAL_TABLET | Freq: Every day | ORAL | 5 refills | Status: DC

## 2018-09-24 MED ORDER — METFORMIN HCL 1000 MG PO TABS: ORAL_TABLET | ORAL | 5 refills | Status: DC

## 2018-09-24 MED ORDER — GLIPIZIDE ER 10 MG PO TB24: 20.0000 mg | ORAL_TABLET | Freq: Every day | ORAL | 5 refills | Status: DC

## 2018-09-24 MED ORDER — INSULIN GLARGINE 100 UNIT/ML SOLOSTAR PEN: 25.0000 [IU] | PEN_INJECTOR | Freq: Every day | SUBCUTANEOUS | 5 refills | Status: DC

## 2018-09-24 MED FILL — glipiZIDE XL 10 MG TB24: 10 | 90 days supply | Qty: 180 | Fill #0

## 2018-09-24 MED FILL — LANTUS SOLOSTAR 100 UNITS/M: 100 | 12 days supply | Qty: 3 | Fill #0

## 2018-09-24 MED FILL — PRAVASTATIN NA 40 MG TAB: 40 | 90 days supply | Qty: 90 | Fill #0

## 2018-09-24 MED FILL — LOSARTAN-HCTZ 50-12.5 MG TA: 50-12.5 | 90 days supply | Qty: 90 | Fill #0

## 2018-09-24 MED FILL — AMLODIPINE BESYLATE 10 MG T: 10 | 90 days supply | Qty: 90 | Fill #0

## 2018-09-24 MED FILL — PANTOPRAZOLE SOD DR 40 MG T: 40 | 90 days supply | Qty: 90 | Fill #0

## 2018-09-24 MED FILL — GABAPENTIN 300 MG CAPSULE: 300 | 90 days supply | Qty: 90 | Fill #0

## 2018-09-24 MED FILL — metFORMIN HCL 1000 MG TABS: 1000 | 90 days supply | Qty: 225 | Fill #0

## 2018-09-24 NOTE — Progress Notes (Signed)
Subjective:  Patient ID: Jerry Serrano, male    DOB: [birth year]  Age: 68 y.o. MRN: [id number]  CC: Diabetes   HPI Jerry Serrano is a 68 year old male with history of hypertension, hyperlipidemia, type 2 diabetes mellitus (A1c 11.2) who presents today for follow-up visit. His A1c is 11.2 which is up from 8.3 previously.  He attributes this to administering Lantus only when he can afford it due to the high cost and he never commenced Victoza due to the fact that it cost him over $200.  He tries to adhere to a diabetic diet the best he can and gets a lot of exercise at his job at the [employer]. He is compliant with his antihypertensive and statin.  He complains of intermittent epigastric pain which sometimes started with a feeling of hunger and it lasts for a day then resolves.  He denies dyspepsia, sour taste in mouth, reflux and has no diarrhea, vomiting, nausea or constipation.  Past Medical History:  Diagnosis Date  . Allergy   . Arthritis   . Diabetes mellitus   . Hyperlipidemia   . Hypertension     Past Surgical History:  Procedure Laterality Date  . HERNIA REPAIR      Family History  Problem Relation Age of Onset  . Colon cancer Neg Hx   . Esophageal cancer Neg Hx   . Rectal cancer Neg Hx   . Stomach cancer Neg Hx     No Known Allergies  Outpatient Medications Prior to Visit  Medication Sig Dispense Refill  . aspirin EC 81 MG tablet Take 1 tablet (81 mg total) by mouth daily. 30 tablet 3  . Blood Glucose Monitoring Suppl (ACCU-CHEK AVIVA PLUS) w/Device KIT USE AS DIRECTED 4 TIMES DAILY AFTER MEALS AND AT BEDTIME. 1 kit 0  . diclofenac (VOLTAREN) 75 MG EC tablet TAKE 1 TABLET BY MOUTH 2 TIMES DAILY AS NEEDED FOR MODERATE PAIN. 60 tablet 0  . glucose blood (ACCU-CHEK AVIVA) test strip Use as instructed 3 times daily. E11.9 100 each 12  . Insulin Pen Needle 31G X 5 MM MISC 1 each by Does not apply route at bedtime. 30 each 5  . Lancet Devices (ACCU-CHEK SOFTCLIX)  lancets Use as instructed 1 each 12  . liraglutide (VICTOZA) 18 MG/3ML SOPN Inject subcutaneously daily 0.6 mg for 1 week then 1.2 mg for 1 week then 1.8 mg thereafter 3 pen 3  . MICROLET LANCETS MISC Test 3 times per day 100 each 12  . sildenafil (VIAGRA) 50 MG tablet Take 1 tablet (50 mg total) by mouth daily as needed for erectile dysfunction. 10 tablet 1  . amLODipine (NORVASC) 10 MG tablet Take 1 tablet (10 mg total) by mouth daily. 30 tablet 5  . gabapentin (NEURONTIN) 300 MG capsule Take 1 capsule (300 mg total) by mouth daily. 30 capsule 3  . glipiZIDE (GLIPIZIDE XL) 10 MG 24 hr tablet Take 2 tablets (20 mg total) by mouth daily with breakfast. 60 tablet 5  . Insulin Glargine (LANTUS SOLOSTAR) 100 UNIT/ML Solostar Pen Inject 15 Units into the skin daily at 10 pm. 3 pen 5  . losartan-hydrochlorothiazide (HYZAAR) 50-12.5 MG tablet Take 1 tablet by mouth daily. 30 tablet 5  . metFORMIN (GLUCOPHAGE) 1000 MG tablet TAKE 1.5 TABLET ('1500MG'$ ) BY MOUTH IN THE MORNING AND 1 TABLET ('1000MG'$ ) IN THE EVENING. 75 tablet 5  . pravastatin (PRAVACHOL) 40 MG tablet Take 1 tablet (40 mg total) by mouth daily. 30 tablet 5  Facility-Administered Medications Prior to Visit  Medication Dose Route Frequency Provider Last Rate Last Dose  . 0.9 %  sodium chloride infusion  500 mL Intravenous Continuous Danis, Estill Cotta III, MD         ROS Review of Systems  Constitutional: Negative for activity change and appetite change.  HENT: Negative for sinus pressure and sore throat.   Eyes: Negative for visual disturbance.  Respiratory: Negative for cough, chest tightness and shortness of breath.   Cardiovascular: Negative for chest pain and leg swelling.  Gastrointestinal: Negative for abdominal distention, abdominal pain, constipation and diarrhea.  Endocrine: Negative.   Genitourinary: Negative for dysuria.  Musculoskeletal: Negative for joint swelling and myalgias.  Skin: Negative for rash.   Allergic/Immunologic: Negative.   Neurological: Negative for weakness, light-headedness and numbness.  Psychiatric/Behavioral: Negative for dysphoric mood and suicidal ideas.    Objective:  BP 138/76   Pulse 86   Temp 97.8 F (36.6 C) (Oral)   Ht '5\' 4"'$  (1.626 m)   Wt 144 lb (65.3 kg)   SpO2 96%   BMI 24.72 kg/m   BP/Weight 09/24/2018 06/04/2018 9/62/2297  Systolic BP 989 211 941  Diastolic BP 76 77 72  Wt. (Lbs) 144 143.4 142  BMI 24.72 24.61 24.37      Physical Exam Constitutional:      Appearance: He is well-developed.  Cardiovascular:     Rate and Rhythm: Normal rate.     Heart sounds: Normal heart sounds. No murmur.  Pulmonary:     Effort: Pulmonary effort is normal.     Breath sounds: Normal breath sounds. No wheezing or rales.  Chest:     Chest wall: No tenderness.  Abdominal:     General: Bowel sounds are normal. There is no distension.     Palpations: Abdomen is soft. There is no mass.     Tenderness: There is no abdominal tenderness.  Musculoskeletal: Normal range of motion.  Neurological:     Mental Status: He is alert and oriented to person, place, and time.  Psychiatric:        Mood and Affect: Mood normal.        Behavior: Behavior normal.     CMP Latest Ref Rng & Units 06/04/2018 10/10/2017 04/10/2017  Glucose 65 - 99 mg/dL 100(H) 126(H) 144(H)  BUN 8 - 27 mg/dL '16 24 15  '$ Creatinine 0.76 - 1.27 mg/dL 0.80 1.01 0.81  Sodium 134 - 144 mmol/L 140 142 137  Potassium 3.5 - 5.2 mmol/L 4.3 4.6 4.7  Chloride 96 - 106 mmol/L 103 105 101  CO2 20 - 29 mmol/L '21 20 23  '$ Calcium 8.6 - 10.2 mg/dL 10.1 10.1 9.8  Total Protein 6.0 - 8.5 g/dL - 7.5 6.8  Total Bilirubin 0.0 - 1.2 mg/dL - 0.2 0.4  Alkaline Phos 39 - 117 IU/L - 103 64  AST 0 - 40 IU/L - 24 18  ALT 0 - 44 IU/L - 21 21    Lipid Panel     Component Value Date/Time   CHOL 171 10/10/2017 1233   TRIG 72 10/10/2017 1233   HDL 49 10/10/2017 1233   CHOLHDL 3.5 10/10/2017 1233   CHOLHDL 2.8  02/29/2016 1018   VLDL 12 02/29/2016 1018   LDLCALC 108 (H) 10/10/2017 1233    CBC    Component Value Date/Time   WBC 7.6 05/05/2013 1116   RBC 4.82 05/05/2013 1116   HGB 13.5 05/05/2013 1116   HCT 37.8 (L) 05/05/2013 1116  PLT 364 05/05/2013 1116   MCV 78.4 05/05/2013 1116   MCH 28.0 05/05/2013 1116   MCHC 35.7 05/05/2013 1116   RDW 15.2 05/05/2013 1116   LYMPHSABS 2.9 05/05/2013 1116   MONOABS 0.7 05/05/2013 1116   EOSABS 0.4 05/05/2013 1116   BASOSABS 0.0 05/05/2013 1116    Lab Results  Component Value Date   HGBA1C 11.2 (A) 09/24/2018    Assessment & Plan:   1. Type 2 diabetes mellitus with diabetic polyneuropathy, with long-term current use of insulin (HCC) Uncontrolled with A1c of 11.2 which is up from 8.3 Poor controlled due to rationing his medications as a result of financial constraints Discontinue Victoza due to cost, increase Lantus Counseled on Diabetic diet, my plate method, 562 minutes of moderate intensity exercise/week Keep blood sugar logs with fasting goals of 80-120 mg/dl, random of less than 180 and in the event of sugars less than 60 mg/dl or greater than 400 mg/dl please notify the clinic ASAP. It is recommended that you undergo annual eye exams and annual foot exams. Pneumonia vaccine is recommended. - POCT glucose (manual entry) - POCT glycosylated hemoglobin (Hb A1C) - gabapentin (NEURONTIN) 300 MG capsule; Take 1 capsule (300 mg total) by mouth daily.  Dispense: 30 capsule; Refill: 5  2. Essential hypertension Controlled Counseled on blood pressure goal of less than 130/80, low-sodium, DASH diet, medication compliance, 150 minutes of moderate intensity exercise per week. Discussed medication compliance, adverse effects. - amLODipine (NORVASC) 10 MG tablet; Take 1 tablet (10 mg total) by mouth daily.  Dispense: 30 tablet; Refill: 5 - losartan-hydrochlorothiazide (HYZAAR) 50-12.5 MG tablet; Take 1 tablet by mouth daily.  Dispense: 30 tablet;  Refill: 5  3. Type 2 diabetes mellitus without complication, without long-term current use of insulin (HCC) See #1 above - glipiZIDE (GLIPIZIDE XL) 10 MG 24 hr tablet; Take 2 tablets (20 mg total) by mouth daily with breakfast.  Dispense: 60 tablet; Refill: 5 - metFORMIN (GLUCOPHAGE) 1000 MG tablet; TAKE 1.5 TABLET ('1500MG'$ ) BY MOUTH IN THE MORNING AND 1 TABLET ('1000MG'$ ) IN THE EVENING.  Dispense: 75 tablet; Refill: 5 - Insulin Glargine (LANTUS SOLOSTAR) 100 UNIT/ML Solostar Pen; Inject 25 Units into the skin daily at 10 pm.  Dispense: 3 pen; Refill: 5  4. Pure hypercholesterolemia Controlled Low cholesterol diet - pravastatin (PRAVACHOL) 40 MG tablet; Take 1 tablet (40 mg total) by mouth daily.  Dispense: 30 tablet; Refill: 5   Meds ordered this encounter  Medications  . pantoprazole (PROTONIX) 40 MG tablet    Sig: Take 1 tablet (40 mg total) by mouth daily.    Dispense:  30 tablet    Refill:  5  . amLODipine (NORVASC) 10 MG tablet    Sig: Take 1 tablet (10 mg total) by mouth daily.    Dispense:  30 tablet    Refill:  5  . gabapentin (NEURONTIN) 300 MG capsule    Sig: Take 1 capsule (300 mg total) by mouth daily.    Dispense:  30 capsule    Refill:  5  . glipiZIDE (GLIPIZIDE XL) 10 MG 24 hr tablet    Sig: Take 2 tablets (20 mg total) by mouth daily with breakfast.    Dispense:  60 tablet    Refill:  5  . losartan-hydrochlorothiazide (HYZAAR) 50-12.5 MG tablet    Sig: Take 1 tablet by mouth daily.    Dispense:  30 tablet    Refill:  5  . metFORMIN (GLUCOPHAGE) 1000 MG tablet  Sig: TAKE 1.5 TABLET ('1500MG'$ ) BY MOUTH IN THE MORNING AND 1 TABLET ('1000MG'$ ) IN THE EVENING.    Dispense:  75 tablet    Refill:  5  . pravastatin (PRAVACHOL) 40 MG tablet    Sig: Take 1 tablet (40 mg total) by mouth daily.    Dispense:  30 tablet    Refill:  5  . Insulin Glargine (LANTUS SOLOSTAR) 100 UNIT/ML Solostar Pen    Sig: Inject 25 Units into the skin daily at 10 pm.    Dispense:  3 pen     Refill:  5    Discontinue previous dose; discontinue victoza    Follow-up: Return in about 3 months (around 12/25/2018) for follow up of chronic medical conditions.       Charlott Rakes, MD, FAAFP. [facility] [place], [place]   09/24/2018, 12:43 PM

## 2018-09-24 NOTE — Patient Instructions (Signed)
Diabetes Mellitus and Nutrition, Adult  When you have diabetes (diabetes mellitus), it is very important to have healthy eating habits because your blood sugar (glucose) levels are greatly affected by what you eat and drink. Eating healthy foods in the appropriate amounts, at about the same times every day, can help you:  · Control your blood glucose.  · Lower your risk of heart disease.  · Improve your blood pressure.  · Reach or maintain a healthy weight.  Every person with diabetes is different, and each person has different needs for a meal plan. Your health care provider may recommend that you work with a diet and nutrition specialist (dietitian) to make a meal plan that is best for you. Your meal plan may vary depending on factors such as:  · The calories you need.  · The medicines you take.  · Your weight.  · Your blood glucose, blood pressure, and cholesterol levels.  · Your activity level.  · Other health conditions you have, such as heart or kidney disease.  How do carbohydrates affect me?  Carbohydrates, also called carbs, affect your blood glucose level more than any other type of food. Eating carbs naturally raises the amount of glucose in your blood. Carb counting is a method for keeping track of how many carbs you eat. Counting carbs is important to keep your blood glucose at a healthy level, especially if you use insulin or take certain oral diabetes medicines.  It is important to know how many carbs you can safely have in each meal. This is different for every person. Your dietitian can help you calculate how many carbs you should have at each meal and for each snack.  Foods that contain carbs include:  · Bread, cereal, rice, pasta, and crackers.  · Potatoes and corn.  · Peas, beans, and lentils.  · Milk and yogurt.  · Fruit and juice.  · Desserts, such as cakes, cookies, ice cream, and candy.  How does alcohol affect me?  Alcohol can cause a sudden decrease in blood glucose (hypoglycemia),  especially if you use insulin or take certain oral diabetes medicines. Hypoglycemia can be a life-threatening condition. Symptoms of hypoglycemia (sleepiness, dizziness, and confusion) are similar to symptoms of having too much alcohol.  If your health care provider says that alcohol is safe for you, follow these guidelines:  · Limit alcohol intake to no more than 1 drink per day for nonpregnant women and 2 drinks per day for men. One drink equals 12 oz of beer, 5 oz of wine, or 1½ oz of hard liquor.  · Do not drink on an empty stomach.  · Keep yourself hydrated with water, diet soda, or unsweetened iced tea.  · Keep in mind that regular soda, juice, and other mixers may contain a lot of sugar and must be counted as carbs.  What are tips for following this plan?    Reading food labels  · Start by checking the serving size on the "Nutrition Facts" label of packaged foods and drinks. The amount of calories, carbs, fats, and other nutrients listed on the label is based on one serving of the item. Many items contain more than one serving per package.  · Check the total grams (g) of carbs in one serving. You can calculate the number of servings of carbs in one serving by dividing the total carbs by 15. For example, if a food has 30 g of total carbs, it would be equal to 2   servings of carbs.  · Check the number of grams (g) of saturated and trans fats in one serving. Choose foods that have low or no amount of these fats.  · Check the number of milligrams (mg) of salt (sodium) in one serving. Most people should limit total sodium intake to less than 2,300 mg per day.  · Always check the nutrition information of foods labeled as "low-fat" or "nonfat". These foods may be higher in added sugar or refined carbs and should be avoided.  · Talk to your dietitian to identify your daily goals for nutrients listed on the label.  Shopping  · Avoid buying canned, premade, or processed foods. These foods tend to be high in fat, sodium,  and added sugar.  · Shop around the outside edge of the grocery store. This includes fresh fruits and vegetables, bulk grains, fresh meats, and fresh dairy.  Cooking  · Use low-heat cooking methods, such as baking, instead of high-heat cooking methods like deep frying.  · Cook using healthy oils, such as olive, canola, or sunflower oil.  · Avoid cooking with butter, cream, or high-fat meats.  Meal planning  · Eat meals and snacks regularly, preferably at the same times every day. Avoid going long periods of time without eating.  · Eat foods high in fiber, such as fresh fruits, vegetables, beans, and whole grains. Talk to your dietitian about how many servings of carbs you can eat at each meal.  · Eat 4-6 ounces (oz) of lean protein each day, such as lean meat, chicken, fish, eggs, or tofu. One oz of lean protein is equal to:  ? 1 oz of meat, chicken, or fish.  ? 1 egg.  ? ¼ cup of tofu.  · Eat some foods each day that contain healthy fats, such as avocado, nuts, seeds, and fish.  Lifestyle  · Check your blood glucose regularly.  · Exercise regularly as told by your health care provider. This may include:  ? 150 minutes of moderate-intensity or vigorous-intensity exercise each week. This could be brisk walking, biking, or water aerobics.  ? Stretching and doing strength exercises, such as yoga or weightlifting, at least 2 times a week.  · Take medicines as told by your health care provider.  · Do not use any products that contain nicotine or tobacco, such as cigarettes and e-cigarettes. If you need help quitting, ask your health care provider.  · Work with a counselor or diabetes educator to identify strategies to manage stress and any emotional and social challenges.  Questions to ask a health care provider  · Do I need to meet with a diabetes educator?  · Do I need to meet with a dietitian?  · What number can I call if I have questions?  · When are the best times to check my blood glucose?  Where to find more  information:  · American Diabetes Association: diabetes.org  · Academy of Nutrition and Dietetics: www.eatright.org  · National Institute of Diabetes and Digestive and Kidney Diseases (NIH): www.niddk.nih.gov  Summary  · A healthy meal plan will help you control your blood glucose and maintain a healthy lifestyle.  · Working with a diet and nutrition specialist (dietitian) can help you make a meal plan that is best for you.  · Keep in mind that carbohydrates (carbs) and alcohol have immediate effects on your blood glucose levels. It is important to count carbs and to use alcohol carefully.  This information is not intended to   replace advice given to you by your health care provider. Make sure you discuss any questions you have with your health care provider.  Document Released: 03/23/2005 Document Revised: 01/24/2017 Document Reviewed: 07/31/2016  [employer]vier Interactive Patient Education © 2019 [employer]

## 2018-09-30 ENCOUNTER — Other Ambulatory Visit: Payer: Self-pay | Admitting: Family Medicine

## 2018-09-30 DIAGNOSIS — M19049 Primary osteoarthritis, unspecified hand: Secondary | ICD-10-CM

## 2018-10-01 MED FILL — DICLOFENAC SOD EC 75 MG TAB: 75 | 30 days supply | Qty: 60 | Fill #0

## 2018-10-15 MED FILL — LANTUS SOLOSTAR 100 UNITS/M: 100 | 30 days supply | Qty: 9 | Fill #0

## 2018-11-04 MED FILL — DICLOFENAC SOD EC 75 MG TAB: 75 | 60 days supply | Qty: 120 | Fill #1

## 2018-12-23 MED FILL — metFORMIN HCL 1000 MG TABS: 1000 | 90 days supply | Qty: 225 | Fill #1

## 2018-12-26 MED FILL — LANTUS SOLOSTAR 100 UNITS/M: 100 | 30 days supply | Qty: 9 | Fill #1

## 2018-12-30 ENCOUNTER — Other Ambulatory Visit: Payer: Self-pay | Admitting: Family Medicine

## 2018-12-30 DIAGNOSIS — M19049 Primary osteoarthritis, unspecified hand: Secondary | ICD-10-CM

## 2018-12-30 MED FILL — PANTOPRAZOLE SOD DR 40 MG T: 40 | 90 days supply | Qty: 90 | Fill #1

## 2018-12-30 MED FILL — DICLOFENAC SOD EC 75 MG TAB: 75 | 30 days supply | Qty: 60 | Fill #0

## 2018-12-30 MED FILL — GABAPENTIN 300 MG CAPSULE: 300 | 90 days supply | Qty: 90 | Fill #1

## 2018-12-31 ENCOUNTER — Ambulatory Visit: Payer: Medicare Other | Admitting: Family Medicine

## 2019-01-06 MED FILL — glipiZIDE XL 10 MG TB24: 10 | 90 days supply | Qty: 180 | Fill #1

## 2019-01-06 MED FILL — AMLODIPINE BESYLATE 10 MG T: 10 | 90 days supply | Qty: 90 | Fill #1

## 2019-01-07 ENCOUNTER — Other Ambulatory Visit: Payer: Self-pay

## 2019-01-07 ENCOUNTER — Encounter: Payer: Self-pay | Admitting: Family Medicine

## 2019-01-07 ENCOUNTER — Ambulatory Visit: Payer: Medicare Other | Attending: Family Medicine | Admitting: Family Medicine

## 2019-01-07 VITALS — BP 152/75 | HR 94 | Temp 98.6°F | Ht 64.0 in | Wt 179.0 lb

## 2019-01-07 DIAGNOSIS — E7800 Pure hypercholesterolemia, unspecified: Secondary | ICD-10-CM

## 2019-01-07 DIAGNOSIS — E1142 Type 2 diabetes mellitus with diabetic polyneuropathy: Secondary | ICD-10-CM

## 2019-01-07 DIAGNOSIS — E1169 Type 2 diabetes mellitus with other specified complication: Secondary | ICD-10-CM

## 2019-01-07 DIAGNOSIS — Z794 Long term (current) use of insulin: Secondary | ICD-10-CM | POA: Diagnosis not present

## 2019-01-07 DIAGNOSIS — I10 Essential (primary) hypertension: Secondary | ICD-10-CM

## 2019-01-07 LAB — POCT GLYCOSYLATED HEMOGLOBIN (HGB A1C): HbA1c, POC (controlled diabetic range): 9.8 % — AB (ref 0.0–7.0)

## 2019-01-07 LAB — GLUCOSE, POCT (MANUAL RESULT ENTRY): POC Glucose: 306 mg/dl — AB (ref 70–99)

## 2019-01-07 MED ORDER — ATORVASTATIN CALCIUM 40 MG PO TABS: 40.0000 mg | ORAL_TABLET | Freq: Every day | ORAL | 3 refills | Status: DC

## 2019-01-07 MED ORDER — LANTUS SOLOSTAR 100 UNIT/ML ~~LOC~~ SOPN: 30.0000 [IU] | PEN_INJECTOR | Freq: Every day | SUBCUTANEOUS | 5 refills | Status: DC

## 2019-01-07 MED ORDER — LOSARTAN POTASSIUM-HCTZ 50-12.5 MG PO TABS: 1.0000 | ORAL_TABLET | Freq: Every day | ORAL | 5 refills | Status: DC

## 2019-01-07 MED ORDER — GABAPENTIN 300 MG PO CAPS: 300.0000 mg | ORAL_CAPSULE | Freq: Every day | ORAL | 5 refills | Status: DC

## 2019-01-07 MED ORDER — METFORMIN HCL 1000 MG PO TABS: ORAL_TABLET | ORAL | 5 refills | Status: DC

## 2019-01-07 MED ORDER — AMLODIPINE BESYLATE 10 MG PO TABS: 10.0000 mg | ORAL_TABLET | Freq: Every day | ORAL | 5 refills | Status: DC

## 2019-01-07 MED ORDER — GLIPIZIDE ER 10 MG PO TB24: 20.0000 mg | ORAL_TABLET | Freq: Every day | ORAL | 5 refills | Status: DC

## 2019-01-07 MED ORDER — PANTOPRAZOLE SODIUM 40 MG PO TBEC: 40.0000 mg | DELAYED_RELEASE_TABLET | Freq: Every day | ORAL | 5 refills | Status: DC

## 2019-01-07 MED FILL — LOSARTAN-HCTZ 50-12.5 MG TA: 50-12.5 | 90 days supply | Qty: 90 | Fill #0

## 2019-01-07 MED FILL — ATORVASTATIN CALCIUM 40 MG: 40 | 90 days supply | Qty: 90 | Fill #0

## 2019-01-07 MED FILL — LANTUS SOLOSTAR 100 UNITS/M: 100 | 30 days supply | Qty: 15 | Fill #0

## 2019-01-07 NOTE — Progress Notes (Signed)
Subjective:  Patient ID: Jerry Serrano, male    DOB: 06-28-1951  Age: 68 y.o. MRN: [id number]  CC: Diabetes   HPI Jerry Serrano  is a 68 year old male with history of hypertension, hyperlipidemia, type 2 diabetes mellitus (A1c 9.8) who presents today for follow-up visit. His A1c is 9.8 which is down from 11.2 previously.  At his last visit with 2 thought to be discontinued due to the cost and Lantus dose was increased.  Today he informs me his co-pay is $47 and he administers Lantus when he is able to afford the co-pay.  I have spoken with the clinical pharmacist who states they had been in the process of assisting with the application for medication assistance however he never followed through.  His Lantus co-pays are as a result of the medication tier for his insurance company and this is the same for [employer] [place]. He denies hypoglycemic episodes, visual concerns and is not up-to-date on annual eye exam. He has not been compliant with diabetic diet as he eats lots of starchy foods including rice and 'fufu'  He denies chest pain, dyspnea and has no additional concerns today. Doing well on his antihypertensive and his statin.  His blood pressure is elevated which he attributes to taking some ibuprofen tablets due to tooth ache.  Past Medical History:  Diagnosis Date  . Allergy   . Arthritis   . Diabetes mellitus   . Hyperlipidemia   . Hypertension     Past Surgical History:  Procedure Laterality Date  . HERNIA REPAIR      Family History  Problem Relation Age of Onset  . Colon cancer Neg Hx   . Esophageal cancer Neg Hx   . Rectal cancer Neg Hx   . Stomach cancer Neg Hx     No Known Allergies  Outpatient Medications Prior to Visit  Medication Sig Dispense Refill  . aspirin EC 81 MG tablet Take 1 tablet (81 mg total) by mouth daily. 30 tablet 3  . Blood Glucose Monitoring Suppl (ACCU-CHEK AVIVA PLUS) w/Device KIT USE AS DIRECTED 4 TIMES DAILY AFTER MEALS AND AT  BEDTIME. 1 kit 0  . diclofenac (VOLTAREN) 75 MG EC tablet TAKE 1 TABLET BY MOUTH 2 TIMES DAILY AS NEEDED FOR MODERATE PAIN. 60 tablet 2  . glucose blood (ACCU-CHEK AVIVA) test strip Use as instructed 3 times daily. E11.9 100 each 12  . Insulin Pen Needle 31G X 5 MM MISC 1 each by Does not apply route at bedtime. 30 each 5  . Lancet Devices (ACCU-CHEK SOFTCLIX) lancets Use as instructed 1 each 12  . MICROLET LANCETS MISC Test 3 times per day 100 each 12  . amLODipine (NORVASC) 10 MG tablet Take 1 tablet (10 mg total) by mouth daily. 30 tablet 5  . gabapentin (NEURONTIN) 300 MG capsule Take 1 capsule (300 mg total) by mouth daily. 30 capsule 5  . glipiZIDE (GLIPIZIDE XL) 10 MG 24 hr tablet Take 2 tablets (20 mg total) by mouth daily with breakfast. 60 tablet 5  . Insulin Glargine (LANTUS SOLOSTAR) 100 UNIT/ML Solostar Pen Inject 25 Units into the skin daily at 10 pm. 3 pen 5  . liraglutide (VICTOZA) 18 MG/3ML SOPN Inject subcutaneously daily 0.6 mg for 1 week then 1.2 mg for 1 week then 1.8 mg thereafter 3 pen 3  . losartan-hydrochlorothiazide (HYZAAR) 50-12.5 MG tablet Take 1 tablet by mouth daily. 30 tablet 5  . metFORMIN (GLUCOPHAGE) 1000 MG tablet TAKE 1.5 TABLET (  $'1500MG'E$ ) BY MOUTH IN THE MORNING AND 1 TABLET ('1000MG'$ ) IN THE EVENING. 75 tablet 5  . pantoprazole (PROTONIX) 40 MG tablet Take 1 tablet (40 mg total) by mouth daily. 30 tablet 5  . pravastatin (PRAVACHOL) 40 MG tablet Take 1 tablet (40 mg total) by mouth daily. 30 tablet 5  . sildenafil (VIAGRA) 50 MG tablet Take 1 tablet (50 mg total) by mouth daily as needed for erectile dysfunction. (Patient not taking: Reported on 01/07/2019) 10 tablet 1   Facility-Administered Medications Prior to Visit  Medication Dose Route Frequency Provider Last Rate Last Dose  . 0.9 %  sodium chloride infusion  500 mL Intravenous Continuous Danis, Estill Cotta III, MD         ROS Review of Systems  Constitutional: Negative for activity change and appetite  change.  HENT: Negative for sinus pressure and sore throat.   Eyes: Negative for visual disturbance.  Respiratory: Negative for cough, chest tightness and shortness of breath.   Cardiovascular: Negative for chest pain and leg swelling.  Gastrointestinal: Negative for abdominal distention, abdominal pain, constipation and diarrhea.  Endocrine: Negative.   Genitourinary: Negative for dysuria.  Musculoskeletal: Negative for joint swelling and myalgias.  Skin: Negative for rash.  Allergic/Immunologic: Negative.   Neurological: Negative for weakness, light-headedness and numbness.  Psychiatric/Behavioral: Negative for dysphoric mood and suicidal ideas.    Objective:  BP (!) 152/75   Pulse 94   Temp 98.6 F (37 C) (Oral)   Ht '5\' 4"'$  (1.626 m)   Wt 179 lb (81.2 kg)   SpO2 98%   BMI 30.73 kg/m   BP/Weight 01/07/2019 09/24/2018 40/04/2724  Systolic BP 366 440 347  Diastolic BP 75 76 77  Wt. (Lbs) 179 144 143.4  BMI 30.73 24.72 24.61      Physical Exam Constitutional:      Appearance: He is well-developed.  Cardiovascular:     Rate and Rhythm: Normal rate.     Heart sounds: Normal heart sounds. No murmur.  Pulmonary:     Effort: Pulmonary effort is normal.     Breath sounds: Normal breath sounds. No wheezing or rales.  Chest:     Chest wall: No tenderness.  Abdominal:     General: Bowel sounds are normal. There is no distension.     Palpations: Abdomen is soft. There is no mass.     Tenderness: There is no abdominal tenderness.  Musculoskeletal: Normal range of motion.  Neurological:     Mental Status: He is alert and oriented to person, place, and time.     CMP Latest Ref Rng & Units 06/04/2018 10/10/2017 04/10/2017  Glucose 65 - 99 mg/dL 100(H) 126(H) 144(H)  BUN 8 - 27 mg/dL '16 24 15  '$ Creatinine 0.76 - 1.27 mg/dL 0.80 1.01 0.81  Sodium 134 - 144 mmol/L 140 142 137  Potassium 3.5 - 5.2 mmol/L 4.3 4.6 4.7  Chloride 96 - 106 mmol/L 103 105 101  CO2 20 - 29 mmol/L '21 20  23  '$ Calcium 8.6 - 10.2 mg/dL 10.1 10.1 9.8  Total Protein 6.0 - 8.5 g/dL - 7.5 6.8  Total Bilirubin 0.0 - 1.2 mg/dL - 0.2 0.4  Alkaline Phos 39 - 117 IU/L - 103 64  AST 0 - 40 IU/L - 24 18  ALT 0 - 44 IU/L - 21 21    Lipid Panel     Component Value Date/Time   CHOL 171 10/10/2017 1233   TRIG 72 10/10/2017 1233   HDL 49  10/10/2017 1233   CHOLHDL 3.5 10/10/2017 1233   CHOLHDL 2.8 02/29/2016 1018   VLDL 12 02/29/2016 1018   LDLCALC 108 (H) 10/10/2017 1233    CBC    Component Value Date/Time   WBC 7.6 05/05/2013 1116   RBC 4.82 05/05/2013 1116   HGB 13.5 05/05/2013 1116   HCT 37.8 (L) 05/05/2013 1116   PLT 364 05/05/2013 1116   MCV 78.4 05/05/2013 1116   MCH 28.0 05/05/2013 1116   MCHC 35.7 05/05/2013 1116   RDW 15.2 05/05/2013 1116   LYMPHSABS 2.9 05/05/2013 1116   MONOABS 0.7 05/05/2013 1116   EOSABS 0.4 05/05/2013 1116   BASOSABS 0.0 05/05/2013 1116    Lab Results  Component Value Date   HGBA1C 9.8 (A) 01/07/2019    Assessment & Plan:   1. Type 2 diabetes mellitus with diabetic polyneuropathy, with long-term current use of insulin (HCC) Uncontrolled with A1c of 9.8 but this has trended down from 11.2 previously He was previously unable to afford Victoza which was discontinued Has been stretching Lantus due to difficulty affording his co-pays I have spoken with the clinical pharmacist who will work with this patient in regards to medication assistance options Compliance with a diabetic diet is also a major problem -he will need to work on cutting down on carbs Counseled on Diabetic diet, my plate method, 098 minutes of moderate intensity exercise/week Keep blood sugar logs with fasting goals of 80-120 mg/dl, random of less than 180 and in the event of sugars less than 60 mg/dl or greater than 400 mg/dl please notify the clinic ASAP. It is recommended that you undergo annual eye exams and annual foot exams. Pneumonia vaccine is recommended. - POCT glucose (manual  entry) - POCT glycosylated hemoglobin (Hb A1C) - CMP14+EGFR - Microalbumin / creatinine urine ratio - Ambulatory referral to Ophthalmology - gabapentin (NEURONTIN) 300 MG capsule; Take 1 capsule (300 mg total) by mouth daily.  Dispense: 30 capsule; Refill: 5  2. Type 2 diabetes mellitus with other specified complication, without long-term current use of insulin (HCC) See #1 above - Insulin Glargine (LANTUS SOLOSTAR) 100 UNIT/ML Solostar Pen; Inject 30 Units into the skin daily at 10 pm.  Dispense: 5 pen; Refill: 5 - metFORMIN (GLUCOPHAGE) 1000 MG tablet; TAKE 1.5 TABLET ('1500MG'$ ) BY MOUTH IN THE MORNING AND 1 TABLET ('1000MG'$ ) IN THE EVENING.  Dispense: 75 tablet; Refill: 5 - glipiZIDE (GLIPIZIDE XL) 10 MG 24 hr tablet; Take 2 tablets (20 mg total) by mouth daily with breakfast.  Dispense: 60 tablet; Refill: 5  3. Essential hypertension Uncontrolled Elevation likely due to recent ingestion of NSAID - losartan-hydrochlorothiazide (HYZAAR) 50-12.5 MG tablet; Take 1 tablet by mouth daily.  Dispense: 30 tablet; Refill: 5 - amLODipine (NORVASC) 10 MG tablet; Take 1 tablet (10 mg total) by mouth daily.  Dispense: 30 tablet; Refill: 5  4. Pure hypercholesterolemia Controlled Switched to atorvastatin which is a moderate intensity statin Low-cholesterol diet   Meds ordered this encounter  Medications  . Insulin Glargine (LANTUS SOLOSTAR) 100 UNIT/ML Solostar Pen    Sig: Inject 30 Units into the skin daily at 10 pm.    Dispense:  5 pen    Refill:  5    Discontinue previous dose; discontinue victoza  . atorvastatin (LIPITOR) 40 MG tablet    Sig: Take 1 tablet (40 mg total) by mouth daily.    Dispense:  90 tablet    Refill:  3    Discontinue Pravastatin  . metFORMIN (GLUCOPHAGE) 1000  MG tablet    Sig: TAKE 1.5 TABLET ('1500MG'$ ) BY MOUTH IN THE MORNING AND 1 TABLET ('1000MG'$ ) IN THE EVENING.    Dispense:  75 tablet    Refill:  5  . losartan-hydrochlorothiazide (HYZAAR) 50-12.5 MG tablet    Sig:  Take 1 tablet by mouth daily.    Dispense:  30 tablet    Refill:  5  . amLODipine (NORVASC) 10 MG tablet    Sig: Take 1 tablet (10 mg total) by mouth daily.    Dispense:  30 tablet    Refill:  5  . glipiZIDE (GLIPIZIDE XL) 10 MG 24 hr tablet    Sig: Take 2 tablets (20 mg total) by mouth daily with breakfast.    Dispense:  60 tablet    Refill:  5  . gabapentin (NEURONTIN) 300 MG capsule    Sig: Take 1 capsule (300 mg total) by mouth daily.    Dispense:  30 capsule    Refill:  5  . pantoprazole (PROTONIX) 40 MG tablet    Sig: Take 1 tablet (40 mg total) by mouth daily.    Dispense:  30 tablet    Refill:  5    Follow-up: Return in about 3 months (around 04/09/2019) for medical conditions.       Charlott Rakes, MD, FAAFP. [facility] [place], [place]   01/07/2019, 12:23 PM

## 2019-01-07 NOTE — Patient Instructions (Signed)

## 2019-01-07 NOTE — Patient Outreach (Signed)
[facility]) Care Management  01/07/2019  Jerry Serrano 09-22-1950 [id number]   Telephone Screen  Referral Date: 01/07/2019 Referral Source: MD Office Referral Reason: " DM,HTN, help with insulin cost" Insurance: [facility] Medicare   Incoming call from patient returning RN CM call. Spoke with patient and screening completed. Patient states that he currently lives with his son. He reports that he is independent with ADLs/IADLs. He is able to drive himself to appts.He denies any recent falls.  Conditions: Per chart review, patient has PMH of DM with polyneuropathy, HTN and HLD. Patient admits that he is not managing his chronic conditions well. He reports that "it has been a long time" since he checked his blood sugars in the home. He is unable to provide a reason why other than the fact he just hasn't done so. RN CM confirmed with patient that he does have working meter and knows how to use it. He states that he will try to start checking cbgs about 2x/day. Per MD note, patient "noncompliant with diet." He admits to eating a lot of starchy foods. He had A1C level checked today at PCP appt and was 9.8. Patient needs further education and support in managing his Diabetes and is agreeable to [place] referral.  Medications: Per patient, he is taking about five meds. He is not consistently taking Lantus as he tries to "make it last a while." He reports that his insulin costs him about "$47 and it only lasts two weeks." Patient stated that he has both [facility] Medicare and Medicaid and does not understand why his meds are so costly. RN CM discussed [facility] pharmacy referral for possible med assistance and patient agreeable.  Appts: Patient saw PCP on today.   Consent: [facility] services reviewed and discussed with patient. Verbal consent for services given.   Plan: RN CM will send [place] referral for further disease mgmt, education and support. RN CM will send [place]  referral for possible med assistance.    Enzo Montgomery, RN,BSN,CCM [place] [facility] Telephonic Care Management Coordinator Direct Phone: [phone] Toll Free: [phone] Fax: [phone]

## 2019-01-07 NOTE — Patient Outreach (Signed)
[facility]) Care Management  01/07/2019  Jerry Serrano [birth year] [id number]   Telephone Screen  Referral Date: 01/07/2019 Referral Source: MD Office Referral Reason: " DM,HTN, help with insulin cost" Insurance: [facility] Medicare   Outreach attempt # 1 to patient. Spoke with patient. He voices that he is unable to talk at present as he is in a  restaurant. RN CM provided patient with contact info for him to call RN CM back.     Plan: RN CM will follow up with patient within 3-4 business days if no return call from patient.   Enzo Montgomery, RN,BSN,CCM [place] Management Telephonic Care Management Coordinator Direct Phone: [phone] Toll Free: [phone] Fax: [phone]

## 2019-01-08 ENCOUNTER — Other Ambulatory Visit: Payer: Self-pay

## 2019-01-08 LAB — CMP14+EGFR
ALT: 22 IU/L (ref 0–44)
AST: 19 IU/L (ref 0–40)
Albumin/Globulin Ratio: 1.9 (ref 1.2–2.2)
Albumin: 4.6 g/dL (ref 3.8–4.8)
Alkaline Phosphatase: 103 IU/L (ref 39–117)
BUN/Creatinine Ratio: 19 (ref 10–24)
BUN: 15 mg/dL (ref 8–27)
Bilirubin Total: 0.2 mg/dL (ref 0.0–1.2)
CO2: 22 mmol/L (ref 20–29)
Calcium: 9.6 mg/dL (ref 8.6–10.2)
Chloride: 100 mmol/L (ref 96–106)
Creatinine, Ser: 0.8 mg/dL (ref 0.76–1.27)
GFR calc Af Amer: 105 mL/min/{1.73_m2} (ref >59)
GFR calc non Af Amer: 91 mL/min/{1.73_m2} (ref >59)
Globulin, Total: 2.4 g/dL (ref 1.5–4.5)
Glucose: 231 mg/dL — ABNORMAL HIGH (ref 65–99)
Potassium: 4.4 mmol/L (ref 3.5–5.2)
Sodium: 137 mmol/L (ref 134–144)
Total Protein: 7 g/dL (ref 6.0–8.5)

## 2019-01-08 LAB — MICROALBUMIN / CREATININE URINE RATIO
Creatinine, Urine: 44.5 mg/dL
Microalb/Creat Ratio: <7 mg/g creat (ref 0–29)
Microalbumin, Urine: <3 ug/mL

## 2019-01-09 ENCOUNTER — Telehealth: Payer: Self-pay

## 2019-01-09 NOTE — Telephone Encounter (Signed)
-----   Message from Charlott Rakes, MD sent at 01/09/2019  3:20 PM EDT ----- Labs reveal elevated glucose.  Please encourage to adhere to diabetic regimen discussed at his last visit.

## 2019-01-09 NOTE — Telephone Encounter (Signed)
Patient name and DOB has been verified Patient was informed of lab results. Patient had no questions.  

## 2019-01-13 ENCOUNTER — Other Ambulatory Visit: Payer: Self-pay | Admitting: Pharmacist

## 2019-01-13 MED FILL — PRAVASTATIN NA 40 MG TAB: 40 | 90 days supply | Qty: 90 | Fill #1

## 2019-01-13 NOTE — Patient Outreach (Signed)
[facility]) Care Management  01/13/2019  Jerry Serrano September 29, 1950 [id number]   Patient was called regarding medication assistance with Lantus. Unfortunately, he did not answer the phone. HIPAA compliant message was left on the patient's voicemail.  Plan: Send patient an unsuccessful outreach letter. Call patient back in 5-7 business days.   Elayne Guerin, PharmD, [place] Clinical Pharmacist [phone]

## 2019-01-14 ENCOUNTER — Other Ambulatory Visit: Payer: Self-pay | Admitting: Pharmacist

## 2019-01-14 DIAGNOSIS — I10 Essential (primary) hypertension: Secondary | ICD-10-CM

## 2019-01-14 NOTE — Patient Outreach (Signed)
[facility]) Care Management  [place]   01/14/2019  Deitrich Steve Munch 1950-12-29 [id number]  Reason for referral: medication assistance   Referral source: [facility] RN Referral medication(s): Lantus Current insurance: [place]  PMHx:  Type 2 diabetes, hypertension and hyperlipidemia  HPI:  Patient reported issues affording Lantus. HIPAA identifiers were obtained.   Objective: No Known Allergies  Medications Reviewed Today    Reviewed by Elayne Guerin, [facility] (Pharmacist) on 01/14/19 at 1329  Med List Status: <None>  Medication Order Taking? Sig Documenting Provider Last Dose Status Informant  0.9 %  sodium chloride infusion [id number]   Nelida Meuse III, MD  Active   amLODipine (NORVASC) 10 MG tablet [id number] Yes Take 1 tablet (10 mg total) by mouth daily. Charlott Rakes, MD Taking Active   aspirin EC 81 MG tablet [id number] Yes Take 1 tablet (81 mg total) by mouth daily. Charlott Rakes, MD Taking Active   atorvastatin (LIPITOR) 40 MG tablet [id number] Yes Take 1 tablet (40 mg total) by mouth daily. Charlott Rakes, MD Taking Active   Blood Glucose Monitoring Suppl (ACCU-CHEK AVIVA PLUS) w/Device KIT [id number] Yes USE AS DIRECTED 4 TIMES DAILY AFTER MEALS AND AT BEDTIME. Charlott Rakes, MD Taking Active   diclofenac (VOLTAREN) 75 MG EC tablet [id number] Yes TAKE 1 TABLET BY MOUTH 2 TIMES DAILY AS NEEDED FOR MODERATE PAIN. Charlott Rakes, MD Taking Active   gabapentin (NEURONTIN) 300 MG capsule [id number] Yes Take 1 capsule (300 mg total) by mouth daily. Charlott Rakes, MD Taking Active   glipiZIDE (GLIPIZIDE XL) 10 MG 24 hr tablet [id number] Yes Take 2 tablets (20 mg total) by mouth daily with breakfast. Charlott Rakes, MD Taking Active   glucose blood (ACCU-CHEK AVIVA) test strip [id number] Yes Use as instructed 3 times daily. E11.9 Charlott Rakes, MD Taking Active   Insulin Glargine (LANTUS SOLOSTAR) 100 UNIT/ML Solostar Pen [id number] Yes Inject 30  Units into the skin daily at 10 pm. Charlott Rakes, MD Taking Active   Insulin Pen Needle 31G X 5 MM MISC [id number] Yes 1 each by Does not apply route at bedtime. Charlott Rakes, MD Taking Active   Lancet Devices (ACCU-CHEK [place]) lancets [id number] Yes Use as instructed Tresa Garter, MD Taking Active   losartan-hydrochlorothiazide (HYZAAR) 50-12.5 MG tablet [id number] Yes Take 1 tablet by mouth daily. Charlott Rakes, MD Taking Active   metFORMIN (GLUCOPHAGE) 1000 MG tablet [id number] Yes TAKE 1.5 TABLET ('1500MG'$ ) BY MOUTH IN THE MORNING AND 1 TABLET ('1000MG'$ ) IN THE EVENING. Charlott Rakes, MD Taking Active   MICROLET LANCETS MISC [id number] Yes Test 3 times per day Tresa Garter, MD Taking Active   pantoprazole (PROTONIX) 40 MG tablet [id number] Yes Take 1 tablet (40 mg total) by mouth daily. Charlott Rakes, MD Taking Active   pravastatin (PRAVACHOL) 40 MG tablet [id number] Yes Take 1 tablet by mouth daily. [provider] Taking Active   sildenafil (VIAGRA) 50 MG tablet [id number] No Take 1 tablet (50 mg total) by mouth daily as needed for erectile dysfunction.  Patient not taking: Reported on 01/07/2019   Charlott Rakes, MD Not Taking Active           Assessment:  Drugs sorted by system:  Cardiovascular: Amlodipine, Aspirin, Atorvastatin, Losartan-HCTZ, Pravastatin  Gastrointestinal: Pantoprazole,   Endocrine: Glipizide, Lantus, Metformin,   Pain: Diclofenac,   Miscellaneous: Sildenafil  Medication Review Findings:  Therapeutic Duplication-Atorvastatin and Pravastatin.  Atorvastatin was filled 01/07/2019 for a 90 day supply.  Medication Assistance Findings:  Medication assistance needs identified: Lantus  Patient has not spent the required $1000 in out-of-pocket medication expenses to qualify for [employer]'s program. If deemed therapeutically appropriate by her provider, Basaglar can be substituted for Lantus and obtained from [employer] as  they do not require an out-of-pocket spend.  Additional medication assistance options reviewed with patient as warranted:  No other options identified  Plan: I will route patient assistance letter to [place] technician who will coordinate patient assistance program application process for medications listed above.  [facility] pharmacy technician will assist with obtaining all required documents from both patient and provider(s) and submit application(s) once completed.   Route note to Dr. Margarita Rana about switching insulins.    Elayne Guerin, PharmD, [place] Clinical Pharmacist [phone]

## 2019-01-16 ENCOUNTER — Other Ambulatory Visit: Payer: Self-pay | Admitting: Family Medicine

## 2019-01-17 ENCOUNTER — Other Ambulatory Visit: Payer: Self-pay | Admitting: Pharmacist

## 2019-01-17 NOTE — Patient Outreach (Signed)
[facility]) Care Management  01/17/2019  Jerry Serrano 15-Oct-1950 [id number]  Spoke with Claiborne Billings, pharmacy technician in [facility] and [facility] regarding need for patient to apply for manufacturer patient assistance.   Claiborne Billings reports clinic will handle paperwork. Ambrose Pancoast prescriber's portion was faxed to clinic on 01/14/2019 and patient's portion was not mailed out.   Plan:  Will send this message to Sharee Pimple and Alwyn Ren to update them.   Karrie Meres, PharmD, [place] [phone]

## 2019-01-23 ENCOUNTER — Ambulatory Visit: Payer: Medicare Other | Admitting: Pharmacist

## 2019-01-23 ENCOUNTER — Other Ambulatory Visit: Payer: Self-pay | Admitting: Pharmacist

## 2019-01-23 NOTE — Patient Outreach (Signed)
[place] [facility]) Care Management  01/23/2019  Jerry Serrano 01/25/1951 [id number]   Patient's pharmacy case is being closed because his provider's office will be handling his patient assistance process.  Patient has an appointment with [facility] 02/06/2019.   Elayne Guerin, PharmD, [place] Clinical Pharmacist [phone]

## 2019-01-24 ENCOUNTER — Ambulatory Visit: Payer: Self-pay | Admitting: Pharmacist

## 2019-01-29 MED FILL — VICTOZA 18 MG/3 ML INJECT P: 18 | 17 days supply | Qty: 3 | Fill #0

## 2019-02-03 MED FILL — DICLOFENAC SOD EC 75 MG TAB: 75 | 30 days supply | Qty: 60 | Fill #1

## 2019-02-04 ENCOUNTER — Ambulatory Visit: Payer: Medicare Other

## 2019-02-06 ENCOUNTER — Other Ambulatory Visit: Payer: Self-pay

## 2019-02-06 NOTE — Patient Outreach (Signed)
[name]) Care Management  02/06/2019   Jerry Serrano 10/18/1950 [id number]      Outreach attempt # 1 to the patient for initial assessment. HIPAA proved by the patient.  The patient was able to complete the assessment.  Social: The patient lives in the home with his son.  He states he is independent with his care. He has transportation to his appointments.  He denies any falls.  The only equipment he has in the home is a CBG meter (Accu-check).  Conditions: Per chart review the patient has the following conditions: HTN, Type II diabetes and Hyperlipidemia.  The patient states that he has not checked his blood sugars in a long time.  He states that he will start today.  Discussed with him about the importance of checking his blood sugars.  He verbalized understanding.  He states his blood sugars have never been higher than 200.  They range between 74-150. He states that he does not follow any type of special diet.  Discussed with him about how food choices affect his diabetes he verbalized understanding.  He states that he gets exercise on his job and he may walk 2-3 times a week.  Medications:Per chart review and speaking with the patient he is on ten medications.  He states that he is able to manage his medications.  He is currently working with [facility] for medication assistance.  Appointments: 9/16 eye exam with Bernarda Caffey, 10/1 with Dr. Margarita Rana for follow up on diabetes.  Advanced Directives: The patient does not have an advanced directive but he would like for me to send some information.  Current Medications:  Current Outpatient Medications  Medication Sig Dispense Refill  . amLODipine (NORVASC) 10 MG tablet Take 1 tablet (10 mg total) by mouth daily. 30 tablet 5  . aspirin EC 81 MG tablet Take 1 tablet (81 mg total) by mouth daily. 30 tablet 3  . atorvastatin (LIPITOR) 40 MG tablet Take 1 tablet (40 mg total) by mouth daily. 90 tablet 3  . Blood Glucose  Monitoring Suppl (ACCU-CHEK AVIVA PLUS) w/Device KIT USE AS DIRECTED 4 TIMES DAILY AFTER MEALS AND AT BEDTIME. 1 kit 0  . diclofenac (VOLTAREN) 75 MG EC tablet TAKE 1 TABLET BY MOUTH 2 TIMES DAILY AS NEEDED FOR MODERATE PAIN. 60 tablet 2  . gabapentin (NEURONTIN) 300 MG capsule Take 1 capsule (300 mg total) by mouth daily. 30 capsule 5  . glipiZIDE (GLIPIZIDE XL) 10 MG 24 hr tablet Take 2 tablets (20 mg total) by mouth daily with breakfast. 60 tablet 5  . glucose blood (ACCU-CHEK AVIVA) test strip Use as instructed 3 times daily. E11.9 100 each 12  . Insulin Glargine (LANTUS SOLOSTAR) 100 UNIT/ML Solostar Pen Inject 30 Units into the skin daily at 10 pm. 5 pen 5  . Insulin Pen Needle 31G X 5 MM MISC 1 each by Does not apply route at bedtime. 30 each 5  . Lancet Devices (ACCU-CHEK SOFTCLIX) lancets Use as instructed 1 each 12  . losartan-hydrochlorothiazide (HYZAAR) 50-12.5 MG tablet Take 1 tablet by mouth daily. 30 tablet 5  . metFORMIN (GLUCOPHAGE) 1000 MG tablet TAKE 1.5 TABLET ('1500MG'$ ) BY MOUTH IN THE MORNING AND 1 TABLET ('1000MG'$ ) IN THE EVENING. 75 tablet 5  . MICROLET LANCETS MISC Test 3 times per day 100 each 12  . pantoprazole (PROTONIX) 40 MG tablet Take 1 tablet (40 mg total) by mouth daily. 30 tablet 5  . pravastatin (PRAVACHOL) 40 MG tablet  Take 1 tablet by mouth daily.    . sildenafil (VIAGRA) 50 MG tablet Take 1 tablet (50 mg total) by mouth daily as needed for erectile dysfunction. (Patient not taking: Reported on 01/07/2019) 10 tablet 1   Current Facility-Administered Medications  Medication Dose Route Frequency Provider Last Rate Last Dose  . 0.9 %  sodium chloride infusion  500 mL Intravenous Continuous Doran Stabler, MD        Functional Status:  In your present state of health, do you have any difficulty performing the following activities: 02/06/2019  Vision? N  Difficulty concentrating or making decisions? N  Walking or climbing stairs? N  Dressing or bathing? N   Doing errands, shopping? N  Some recent data might be hidden    Fall/Depression Screening: Fall Risk  02/06/2019 01/07/2019 01/07/2019  Falls in the past year? 0 0 0  Number falls in past yr: - 0 -   PHQ 2/9 Scores 02/06/2019 01/07/2019 01/07/2019 09/24/2018 06/04/2018 02/21/2018 10/10/2017  PHQ - 2 Score 0 0 0 0 0 0 0  PHQ- 9 Score - - 0 1 0 0 2    Assessment: Patient will benefit from health coach outreach for disease management and support.  THN CM Care Plan Problem One     Most Recent Value  Care Plan Problem One  Knowledge deficit related to diease management as evidenced by a1c of 9.8  Role Documenting the Problem One  [place] for Problem One  Active  THN Long Term Goal   In 90 days the patient will verbalize lowering his a1c 1-2 points  [facility] Long Term Goal Start Date  02/06/19  Interventions for Problem One Long Term Goal  Discussed blood sugars,encouraged checking his blood sugars daily, eating healthy food, exercise and medication adherence.  Sending the patient educational material for him to read with his family.     Plan:RN Health Coach will provide ongoing education for patient on diabetes through phone calls and sending printed information to patient for further discussion.  RN Health Coach will send Booklet on diabetes.   RN Health Coach will send initial barriers letter, assessment, and care plan to primary care physician.   [place] will send Consent form.  RN Health Coach will contact patient in the month of August and patient agrees to next outreach. Will follow up with the patient about materials mailed out.  [name] RN, BSN, [place] Direct Dial:  [phone]  Fax: [phone]

## 2019-02-14 ENCOUNTER — Other Ambulatory Visit: Payer: Self-pay | Admitting: Pharmacist

## 2019-02-14 MED ORDER — BD PEN NEEDLE NANO 2ND GEN 32G X 4 MM MISC: 2 refills | Status: DC

## 2019-02-17 ENCOUNTER — Other Ambulatory Visit: Payer: Self-pay | Admitting: Family Medicine

## 2019-02-17 MED FILL — TRUEPLUS PEN NDL 32GX5/32: 32G X 4 MM | 90 days supply | Qty: 100 | Fill #0

## 2019-02-17 MED FILL — TRUEPLUS PEN NDL 32GX5/32": 32G X 4 MM | 90 days supply | Qty: 100 | Fill #0

## 2019-02-19 ENCOUNTER — Other Ambulatory Visit: Payer: Self-pay | Admitting: Family Medicine

## 2019-03-06 ENCOUNTER — Other Ambulatory Visit: Payer: Self-pay

## 2019-03-06 ENCOUNTER — Telehealth: Payer: Self-pay

## 2019-03-06 NOTE — Patient Outreach (Signed)
[facility]) Care Management  03/06/2019   Jerry Serrano 1951-06-17 [id number]  Subjective: Successful outreach to the patient for assessment.  Two patient identifiers obtained. The patient states he is doing fine.  He has not received the information that I had mailed to him.  I will resend it. He has been checking his blood sugars daily.  He had not checked this morning because he had just woke up. His FBS yesterday was 109.  He states that in the last month that his blood sugar has gone down to 54. Reviewed the signs and symptoms of hypoglycemia.  Advised the patient to call his physician and let her know.  He states when it is that low he felt dizzy.  He drank some orange juice and rechecked it 15 minutes later and it had gone up.   Discussed diet and having protein, vega tables and fruits.  He states he eats well.  The patient reports that he is moving and gave me the new address.  I will change it in the system.   Current Medications:  Current Outpatient Medications  Medication Sig Dispense Refill  . amLODipine (NORVASC) 10 MG tablet Take 1 tablet (10 mg total) by mouth daily. 30 tablet 5  . aspirin EC 81 MG tablet Take 1 tablet (81 mg total) by mouth daily. 30 tablet 3  . atorvastatin (LIPITOR) 40 MG tablet Take 1 tablet (40 mg total) by mouth daily. 90 tablet 3  . Blood Glucose Monitoring Suppl (ACCU-CHEK AVIVA PLUS) w/Device KIT USE AS DIRECTED 4 TIMES DAILY AFTER MEALS AND AT BEDTIME. 1 kit 0  . diclofenac (VOLTAREN) 75 MG EC tablet TAKE 1 TABLET BY MOUTH 2 TIMES DAILY AS NEEDED FOR MODERATE PAIN. 60 tablet 2  . gabapentin (NEURONTIN) 300 MG capsule Take 1 capsule (300 mg total) by mouth daily. 30 capsule 5  . glipiZIDE (GLIPIZIDE XL) 10 MG 24 hr tablet Take 2 tablets (20 mg total) by mouth daily with breakfast. 60 tablet 5  . glucose blood test strip USE AS DIRECTED THREE TIMES DAILY 100 each 12  . Insulin Glargine (LANTUS SOLOSTAR) 100 UNIT/ML Solostar Pen Inject 30  Units into the skin daily at 10 pm. 5 pen 5  . Insulin Pen Needle (BD PEN NEEDLE NANO 2ND GEN) 32G X 4 MM MISC Use as instructed to inject insulin daily. 100 each 2  . Lancet Devices (ACCU-CHEK SOFTCLIX) lancets Use as instructed 1 each 12  . losartan-hydrochlorothiazide (HYZAAR) 50-12.5 MG tablet Take 1 tablet by mouth daily. 30 tablet 5  . metFORMIN (GLUCOPHAGE) 1000 MG tablet TAKE 1.5 TABLET (1500MG) BY MOUTH IN THE MORNING AND 1 TABLET (1000MG) IN THE EVENING. 75 tablet 5  . MICROLET LANCETS MISC Test 3 times per day 100 each 12  . pantoprazole (PROTONIX) 40 MG tablet Take 1 tablet (40 mg total) by mouth daily. 30 tablet 5  . pravastatin (PRAVACHOL) 40 MG tablet Take 1 tablet by mouth daily.    . sildenafil (VIAGRA) 50 MG tablet Take 1 tablet (50 mg total) by mouth daily as needed for erectile dysfunction. 10 tablet 1   Current Facility-Administered Medications  Medication Dose Route Frequency Provider Last Rate Last Dose  . 0.9 %  sodium chloride infusion  500 mL Intravenous Continuous Doran Stabler, MD        Functional Status:  In your present state of health, do you have any difficulty performing the following activities: 02/06/2019  Vision? N  Difficulty  concentrating or making decisions? N  Walking or climbing stairs? N  Dressing or bathing? N  Doing errands, shopping? N  Some recent data might be hidden    Fall/Depression Screening: Fall Risk  03/06/2019 02/06/2019 01/07/2019  Falls in the past year? 0 0 0  Number falls in past yr: - - 0   PHQ 2/9 Scores 02/06/2019 01/07/2019 01/07/2019 09/24/2018 06/04/2018 02/21/2018 10/10/2017  PHQ - 2 Score 0 0 0 0 0 0 0  PHQ- 9 Score - - 0 1 0 0 2    Assessment: Patient will continue to benefit from health coach outreach for disease management and support. THN CM Care Plan Problem One     Most Recent Value  THN Long Term Goal   In 90 days the patient will verbalize lowering his a1c 1-2 points  THN Long Term Goal Start Date  03/06/19   Interventions for Problem One Long Term Goal  Reviewed fasting goals, discussed food intake and importance of eating meals, reviewed signs and symptoms of hypoglycemia, advised the patient to call his physician to alk about low blood sugars     Plan: RN Health Coach will contact patient in the month of September and patient agrees to next outreach. [place] coach will call next month to confirm if the patient has received the educational material and check his blood sugars.

## 2019-03-06 NOTE — Telephone Encounter (Signed)
Received fax from [facility] with recent A1c results. Per their report on 01/23/2019 patient's A1C was 13.8%

## 2019-03-07 NOTE — Telephone Encounter (Signed)
Jerry Serrano can you please see if this patient has been consistent with Lantus as he had been non compliant due to a high co-pay and Joycelyn Schmid had been trying to obtain assistance for him. What other options do we have for him? Thank you.

## 2019-03-26 ENCOUNTER — Encounter (INDEPENDENT_AMBULATORY_CARE_PROVIDER_SITE_OTHER): Payer: Medicare Other | Admitting: Ophthalmology

## 2019-03-26 ENCOUNTER — Other Ambulatory Visit: Payer: Self-pay

## 2019-03-26 ENCOUNTER — Encounter (INDEPENDENT_AMBULATORY_CARE_PROVIDER_SITE_OTHER): Payer: Self-pay

## 2019-03-28 ENCOUNTER — Encounter (INDEPENDENT_AMBULATORY_CARE_PROVIDER_SITE_OTHER): Payer: Medicare Other | Admitting: Ophthalmology

## 2019-03-31 MED FILL — metFORMIN HCL 1000 MG TABS: 1000 | 30 days supply | Qty: 75 | Fill #0

## 2019-04-03 NOTE — Progress Notes (Signed)
[facility] Note  04/04/2019     CHIEF COMPLAINT Patient presents for Retina Evaluation   HISTORY OF PRESENT ILLNESS: Jerry Serrano is a 68 y.o. male who presents to the clinic today for:   HPI    Retina Evaluation    In both eyes.  This started months ago.  Duration of months.  Context:  distance vision.  I, the attending physician,  performed the HPI with the patient and updated documentation appropriately.          Comments    BS yesterday: 114 Last A1c: 9.0 Patient states he feels like he sees well enough to read and watch television.  Patient's last eye exam was 2 years ago with Dr. Katy Fitch.  Patient denies eye pain or discomfort and denies any new floaters or fol OU.       Last edited by Bernarda Caffey, MD on 04/05/2019  4:52 PM. (History)    pt was referred by his PCP for DM exam, pt states he has never been told he has any eye problems, he has never been on any medications or drops. Pt states that he has been seen previously at [facility]  Referring physician: Charlott Rakes, MD [place],  [place] [address]  HISTORICAL INFORMATION:   Selected notes from the [facility] Referred by [facility] and Wellness for DM exam LEE:  Ocular Hx- PMH-DM (A1c: 8.3 [11.26.19], takes metformin, glipizide, lantus), HTN, HLD, arthritis   CURRENT MEDICATIONS: No current outpatient medications on file. (Ophthalmic Drugs)   No current facility-administered medications for this visit.  (Ophthalmic Drugs)   Current Outpatient Medications (Other)  Medication Sig  . amLODipine (NORVASC) 10 MG tablet Take 1 tablet (10 mg total) by mouth daily.  Marland Kitchen aspirin EC 81 MG tablet Take 1 tablet (81 mg total) by mouth daily.  Marland Kitchen atorvastatin (LIPITOR) 40 MG tablet Take 1 tablet (40 mg total) by mouth daily.  . Blood Glucose Monitoring Suppl (ACCU-CHEK AVIVA PLUS) w/Device KIT USE AS DIRECTED 4 TIMES DAILY AFTER MEALS AND AT BEDTIME.   Marland Kitchen diclofenac (VOLTAREN) 75 MG EC tablet TAKE 1 TABLET BY MOUTH 2 TIMES DAILY AS NEEDED FOR MODERATE PAIN.  Marland Kitchen gabapentin (NEURONTIN) 300 MG capsule Take 1 capsule (300 mg total) by mouth daily.  Marland Kitchen glipiZIDE (GLIPIZIDE XL) 10 MG 24 hr tablet Take 2 tablets (20 mg total) by mouth daily with breakfast.  . glucose blood test strip USE AS DIRECTED THREE TIMES DAILY  . Insulin Glargine (LANTUS SOLOSTAR) 100 UNIT/ML Solostar Pen Inject 30 Units into the skin daily at 10 pm.  . Insulin Pen Needle (BD PEN NEEDLE NANO 2ND GEN) 32G X 4 MM MISC Use as instructed to inject insulin daily.  Elmore Guise Devices (ACCU-CHEK SOFTCLIX) lancets Use as instructed  . losartan-hydrochlorothiazide (HYZAAR) 50-12.5 MG tablet Take 1 tablet by mouth daily.  . metFORMIN (GLUCOPHAGE) 1000 MG tablet TAKE 1.5 TABLET ('1500MG'$ ) BY MOUTH IN THE MORNING AND 1 TABLET ('1000MG'$ ) IN THE EVENING.  Charlott Holler LANCETS MISC Test 3 times per day  . pantoprazole (PROTONIX) 40 MG tablet Take 1 tablet (40 mg total) by mouth daily.  . pravastatin (PRAVACHOL) 40 MG tablet Take 1 tablet by mouth daily.  . sildenafil (VIAGRA) 50 MG tablet Take 1 tablet (50 mg total) by mouth daily as needed for erectile dysfunction.   Current Facility-Administered Medications (Other)  Medication Route  . 0.9 %  sodium chloride infusion  Intravenous      REVIEW OF SYSTEMS: ROS    Positive for: Endocrine, Eyes   Last edited by Doneen Poisson on 04/04/2019 11:21 AM. (History)       ALLERGIES No Known Allergies  PAST MEDICAL HISTORY Past Medical History:  Diagnosis Date  . Allergy   . Arthritis   . Diabetes mellitus   . Hyperlipidemia   . Hypertension    Past Surgical History:  Procedure Laterality Date  . HERNIA REPAIR      FAMILY HISTORY Family History  Problem Relation Age of Onset  . Colon cancer Neg Hx   . Esophageal cancer Neg Hx   . Rectal cancer Neg Hx   . Stomach cancer Neg Hx     SOCIAL HISTORY Social History   Tobacco Use   . Smoking status: Former Smoker    Types: Cigarettes  . Smokeless tobacco: Never Used  . Tobacco comment: over 30 years ago  Substance Use Topics  . Alcohol use: No  . Drug use: No         OPHTHALMIC EXAM:  Base Eye Exam    Visual Acuity (Snellen - Linear)      Right Left   Dist [place] 20/40 -1 20/60 -2   Dist ph [place] 20/40 +1 20/40 -2       Tonometry (Tonopen, 9:49 AM)      Right Left   Pressure 19 13       Pupils      Dark Light Shape React APD   Right 4 3 Round Brisk 0   Left 4 3 Round Brisk 0       Visual Fields      Left Right    Full Full       Extraocular Movement      Right Left    Full Full       Neuro/Psych    Oriented x3: Yes   Mood/Affect: Normal       Dilation    Both eyes: 1.0% Mydriacyl, 2.5% Phenylephrine @ 9:49 AM        Slit Lamp and Fundus Exam    Slit Lamp Exam      Right Left   Lids/Lashes Dermatochalasis - upper lid Dermatochalasis - upper lid   Conjunctiva/Sclera Mild nasal and temporal Pinguecula, Melanosis Mild nasal and temporal Pinguecula, Melanosis   Cornea 2-3+ inferior Punctate epithelial erosions 1-2+ inferior Punctate epithelial erosions, mild Debris in tear film   Anterior Chamber Deep and quiet Deep and quiet   Iris Round and dilated, No NVI Round and dilated, No NVI   Lens 2+ Nuclear sclerosis, 3+ Cortical cataract 2+ Nuclear sclerosis, 3+ Cortical cataract, trace Posterior subcapsular cataract   Vitreous mild Vitreous syneresis mild Vitreous syneresis       Fundus Exam      Right Left   Disc Sharp rim, +cupping, +central pallor, temporal PPP Sharp rim, +cupping, +central pallor, temporal PPP   C/D Ratio 0.7 0.75   Macula Flat, Good foveal reflex, mild Retinal pigment epithelial mottling, No heme or edema Flat, Good foveal reflex, mild Retinal pigment epithelial mottling, No heme or edema   Vessels Mild Vascular attenuation Mild Vascular attenuation   Periphery Attached, No heme  Attached, No heme          Refraction    Wearing Rx      Sphere   Right None   Left None       Manifest Refraction  Sphere Cylinder Axis Dist VA   Right -1.25 +1.50 045 20/30-2   Left -0.50 +0.75 180 20/30-2          IMAGING AND PROCEDURES  Imaging and Procedures for _0 @  OCT, Retina - OU - Both Eyes       Right Eye Quality was good. Central Foveal Thickness: 250. Progression has no prior data. Findings include no IRF, normal foveal contour, no SRF.   Left Eye Quality was good. Central Foveal Thickness: 259. Progression has no prior data. Findings include normal foveal contour, no IRF, no SRF.   Notes *Images captured and stored on drive  Diagnosis / Impression:  NFP, no IRF/SRF OU No DME OU  Clinical management:  See below  Abbreviations: NFP - Normal foveal profile. CME - cystoid macular edema. PED - pigment epithelial detachment. IRF - intraretinal fluid. SRF - subretinal fluid. EZ - ellipsoid zone. ERM - epiretinal membrane. ORA - outer retinal atrophy. ORT - outer retinal tubulation. SRHM - subretinal hyper-reflective material                 ASSESSMENT/PLAN:    ICD-10-CM   1. Diabetes mellitus type 2 without retinopathy ([place])  E11.9   2. Retinal edema  H35.81 OCT, Retina - OU - Both Eyes  3. Essential hypertension  I10   4. Hypertensive retinopathy of both eyes  H35.033   5. Combined forms of age-related cataract of both eyes  H25.813   6. Glaucoma suspect of both eyes  H40.003     1. Diabetes mellitus, type 2 without retinopathy  - The incidence, risk factors for progression, natural history and treatment options for diabetic retinopathy  were discussed with patient.    - The need for close monitoring of blood glucose, blood pressure, and serum lipids, avoiding cigarette or any type of tobacco, and the need for long term follow up was also discussed with patient.  - f/u in 1 year, sooner prn  2. No retinal edema on exam or OCT  3,4. Hypertensive retinopathy  OU  - discussed importance of tight BP control  - monitor  5. Mixed form age related cataract OU  - The symptoms of cataract, surgical options, and treatments and risks were discussed with patient.  - discussed diagnosis and progression  - will refer back to [facility] for eval/managment  6. Glaucoma Suspect OU  - IOP today: 19,13  - +cupping   - will refer back to [facility] for eval/management   Ophthalmic Meds Ordered this visit:  No orders of the defined types were placed in this encounter.      Return in about 1 year (around 04/03/2020) for DM exam, DFE, OCT.  There are no Patient Instructions on file for this visit.   Explained the diagnoses, plan, and follow up with the patient and they expressed understanding.  Patient expressed understanding of the importance of proper follow up care.   This document serves as a record of services personally performed by Gardiner Sleeper, MD, PhD. It was created on their behalf by Ernest Mallick, OA, an ophthalmic assistant. The creation of this record is the provider's dictation and/or activities during the visit.    Electronically signed by: Ernest Mallick, OA  09.24.2020 4:56 PM    Gardiner Sleeper, M.D., Ph.D. Diseases & Surgery of the Retina and Vitreous [facility]  I have reviewed the above documentation for accuracy and completeness, and I agree with the  above. Gardiner Sleeper, M.D., Ph.D. 04/05/19 4:58 PM    Abbreviations: M myopia (nearsighted); A astigmatism; H hyperopia (farsighted); P presbyopia; Mrx spectacle prescription;  CTL contact lenses; OD right eye; OS left eye; OU both eyes  XT exotropia; ET esotropia; PEK punctate epithelial keratitis; PEE punctate epithelial erosions; DES dry eye syndrome; MGD meibomian gland dysfunction; ATs artificial tears; PFAT's preservative free artificial tears; [place] nuclear sclerotic cataract; PSC posterior subcapsular cataract; ERM epi-retinal membrane; PVD  posterior vitreous detachment; RD retinal detachment; DM diabetes mellitus; DR diabetic retinopathy; NPDR non-proliferative diabetic retinopathy; PDR proliferative diabetic retinopathy; CSME clinically significant macular edema; DME diabetic macular edema; dbh dot blot hemorrhages; CWS cotton wool spot; POAG primary open angle glaucoma; C/D cup-to-disc ratio; HVF humphrey visual field; GVF goldmann visual field; OCT optical coherence tomography; IOP intraocular pressure; BRVO Branch retinal vein occlusion; CRVO central retinal vein occlusion; CRAO central retinal artery occlusion; BRAO branch retinal artery occlusion; RT retinal tear; SB scleral buckle; PPV pars plana vitrectomy; VH Vitreous hemorrhage; PRP panretinal laser photocoagulation; IVK intravitreal kenalog; VMT vitreomacular traction; MH Macular hole;  NVD neovascularization of the disc; NVE neovascularization elsewhere; AREDS age related eye disease study; ARMD age related macular degeneration; POAG primary open angle glaucoma; EBMD epithelial/anterior basement membrane dystrophy; ACIOL anterior chamber intraocular lens; IOL intraocular lens; PCIOL posterior chamber intraocular lens; Phaco/IOL phacoemulsification with intraocular lens placement; [place] photorefractive keratectomy; LASIK laser assisted in situ keratomileusis; HTN hypertension; DM diabetes mellitus; COPD chronic obstructive pulmonary disease

## 2019-04-04 ENCOUNTER — Other Ambulatory Visit: Payer: Self-pay

## 2019-04-04 ENCOUNTER — Ambulatory Visit (INDEPENDENT_AMBULATORY_CARE_PROVIDER_SITE_OTHER): Payer: Medicare Other | Admitting: Ophthalmology

## 2019-04-04 DIAGNOSIS — I10 Essential (primary) hypertension: Secondary | ICD-10-CM

## 2019-04-04 DIAGNOSIS — H35033 Hypertensive retinopathy, bilateral: Secondary | ICD-10-CM | POA: Diagnosis not present

## 2019-04-04 DIAGNOSIS — H25813 Combined forms of age-related cataract, bilateral: Secondary | ICD-10-CM | POA: Diagnosis not present

## 2019-04-04 DIAGNOSIS — H3581 Retinal edema: Secondary | ICD-10-CM

## 2019-04-04 DIAGNOSIS — E119 Type 2 diabetes mellitus without complications: Secondary | ICD-10-CM

## 2019-04-04 DIAGNOSIS — H40003 Preglaucoma, unspecified, bilateral: Secondary | ICD-10-CM

## 2019-04-05 ENCOUNTER — Encounter (INDEPENDENT_AMBULATORY_CARE_PROVIDER_SITE_OTHER): Payer: Self-pay | Admitting: Ophthalmology

## 2019-04-08 ENCOUNTER — Other Ambulatory Visit: Payer: Self-pay

## 2019-04-08 NOTE — Patient Outreach (Signed)
[facility]) Care Management  04/08/2019  Jerry Serrano January 02, 1951 [id number]    1st unsuccessful outreach.  No answer.  HIPAA compliant voicemail left with contact information.  Plan: RN Health Coach will send letter. RN Health Coach will make outreach attempt to the patient within thirty business days.  [name] RN, BSN, [place] Direct Dial:  [phone]  Fax: [phone]

## 2019-04-10 ENCOUNTER — Ambulatory Visit: Payer: Medicare Other | Admitting: Family Medicine

## 2019-04-10 ENCOUNTER — Other Ambulatory Visit: Payer: Self-pay | Admitting: Family Medicine

## 2019-04-10 MED FILL — GABAPENTIN 300 MG CAPSULE: 300 | 30 days supply | Qty: 30 | Fill #0

## 2019-04-10 MED FILL — glipiZIDE XL 10 MG TB24: 10 | 30 days supply | Qty: 60 | Fill #0

## 2019-04-10 MED FILL — PANTOPRAZOLE SOD DR 40 MG T: 40 | 30 days supply | Qty: 30 | Fill #0

## 2019-04-10 MED FILL — LANTUS SOLOSTAR 100 UNITS/M: 100 | 30 days supply | Qty: 15 | Fill #1

## 2019-04-10 MED FILL — ATORVASTATIN CALCIUM 40 MG: 40 | 30 days supply | Qty: 30 | Fill #1

## 2019-04-10 MED FILL — AMLODIPINE BESYLATE 10 MG T: 10 | 30 days supply | Qty: 30 | Fill #0

## 2019-04-10 MED FILL — PRAVASTATIN NA 40 MG TAB: 40 | 30 days supply | Qty: 30 | Fill #0

## 2019-04-14 MED FILL — DICLOFENAC SOD EC 75 MG TAB: 75 | 30 days supply | Qty: 60 | Fill #2

## 2019-04-16 ENCOUNTER — Ambulatory Visit: Payer: Medicare Other | Admitting: Family Medicine

## 2019-04-28 MED FILL — LOSARTAN-HCTZ 50-12.5 MG TA: 50-12.5 | 90 days supply | Qty: 90 | Fill #1

## 2019-04-28 MED FILL — metFORMIN HCL 1000 MG TABS: 1000 | 30 days supply | Qty: 75 | Fill #1

## 2019-05-06 MED FILL — PANTOPRAZOLE SOD DR 40 MG T: 40 | 30 days supply | Qty: 30 | Fill #1

## 2019-05-06 MED FILL — GABAPENTIN 300 MG CAPSULE: 300 | 30 days supply | Qty: 30 | Fill #1

## 2019-05-06 MED FILL — PRAVASTATIN NA 40 MG TAB: 40 | 30 days supply | Qty: 30 | Fill #1

## 2019-05-06 MED FILL — AMLODIPINE BESYLATE 10 MG T: 10 | 30 days supply | Qty: 30 | Fill #1

## 2019-05-07 DIAGNOSIS — H25813 Combined forms of age-related cataract, bilateral: Secondary | ICD-10-CM | POA: Diagnosis not present

## 2019-05-07 DIAGNOSIS — H40023 Open angle with borderline findings, high risk, bilateral: Secondary | ICD-10-CM | POA: Diagnosis not present

## 2019-05-07 DIAGNOSIS — E119 Type 2 diabetes mellitus without complications: Secondary | ICD-10-CM | POA: Diagnosis not present

## 2019-05-07 DIAGNOSIS — H1045 Other chronic allergic conjunctivitis: Secondary | ICD-10-CM | POA: Diagnosis not present

## 2019-05-07 LAB — HM DIABETES EYE EXAM

## 2019-05-07 MED FILL — LANTUS SOLOSTAR 100 UNITS/M: 100 | 30 days supply | Qty: 9 | Fill #2

## 2019-05-07 MED FILL — glipiZIDE XL 10 MG TB24: 10 | 30 days supply | Qty: 60 | Fill #1

## 2019-05-08 ENCOUNTER — Other Ambulatory Visit: Payer: Self-pay

## 2019-05-08 NOTE — Patient Outreach (Signed)
[facility] Care Management  05/08/2019  Jerry Serrano 1950-09-09 [id number]    2nd unsuccessful  outreach attempt to the patient.  No answer.  Unable to leave a message due to the mailbox is full.  Plan: RN Health Coach will make outreach attempt to the patient within thirty business  days.   [name] RN, BSN, [place] Direct Dial:  [phone]  Fax: [phone]

## 2019-05-09 ENCOUNTER — Other Ambulatory Visit: Payer: Self-pay

## 2019-05-21 ENCOUNTER — Ambulatory Visit: Payer: Medicare Other | Admitting: Family Medicine

## 2019-06-02 ENCOUNTER — Other Ambulatory Visit: Payer: Self-pay | Admitting: Family Medicine

## 2019-06-02 DIAGNOSIS — M19049 Primary osteoarthritis, unspecified hand: Secondary | ICD-10-CM

## 2019-06-02 MED FILL — GABAPENTIN 300 MG CAPSULE: 300 | 30 days supply | Qty: 30 | Fill #2

## 2019-06-02 MED FILL — AMLODIPINE BESYLATE 10 MG T: 10 | 30 days supply | Qty: 30 | Fill #2

## 2019-06-02 MED FILL — metFORMIN HCL 1000 MG TABS: 1000 | 30 days supply | Qty: 75 | Fill #2

## 2019-06-02 MED FILL — glipiZIDE XL 10 MG TB24: 10 | 30 days supply | Qty: 60 | Fill #2

## 2019-06-02 MED FILL — PANTOPRAZOLE SOD DR 40 MG T: 40 | 30 days supply | Qty: 30 | Fill #2

## 2019-06-04 ENCOUNTER — Other Ambulatory Visit: Payer: Self-pay | Admitting: *Deleted

## 2019-06-04 NOTE — Patient Outreach (Signed)
[facility]) Care Management  06/04/2019  Jerry Serrano May 14, 1951 [id number]   RN Health Coach Monthly Outreach  Referral Date:  01/07/2019 Referral Source:  MD Office Reason for Referral:  "DM, HTN help with insulin cost" Insurance:  [employer]   Outreach Attempt:  Outreach attempt #3 to patient for follow up. No answer and unable to leave voicemail message due to patient's voicemail box being full.  Plan:  RN Health Coach will make another outreach attempt within the month of December.   [place] [phone] [name]

## 2019-06-06 ENCOUNTER — Ambulatory Visit: Payer: Self-pay

## 2019-06-10 ENCOUNTER — Other Ambulatory Visit: Payer: Self-pay | Admitting: Family Medicine

## 2019-06-10 DIAGNOSIS — M19049 Primary osteoarthritis, unspecified hand: Secondary | ICD-10-CM

## 2019-06-11 MED FILL — DICLOFENAC SOD EC 75 MG TAB: 75 | 30 days supply | Qty: 60 | Fill #0

## 2019-06-30 MED FILL — metFORMIN HCL 1000 MG TABS: 1000 | 30 days supply | Qty: 75 | Fill #3

## 2019-06-30 MED FILL — PANTOPRAZOLE SOD DR 40 MG T: 40 | 30 days supply | Qty: 30 | Fill #3

## 2019-06-30 MED FILL — AMLODIPINE BESYLATE 10 MG T: 10 | 30 days supply | Qty: 30 | Fill #3

## 2019-06-30 MED FILL — GABAPENTIN 300 MG CAPSULE: 300 | 30 days supply | Qty: 30 | Fill #3

## 2019-07-02 ENCOUNTER — Other Ambulatory Visit: Payer: Self-pay | Admitting: *Deleted

## 2019-07-02 NOTE — Patient Outreach (Addendum)
[facility]) Care Management  07/02/2019  Jerry Serrano May 11, 1951 [id number]   RN Health Coach Monthly Outreach  Referral Date:  01/07/2019 Referral Source:  MD Office Reason for Referral:  "DM, HTN help with insulin cost" Insurance:  [employer]   Outreach Attempt:  Outreach attempt #4 to patient for follow up. No answer. RN Health Coach left HIPAA compliant voicemail message along with contact information.  Plan:  RN Health Coach will send unsuccessful outreach letter to patient.  RN Health Coach will make another outreach attempt to patient within the month of January if no return call back from patient.  [place] [phone] [email or web address]

## 2019-07-10 ENCOUNTER — Other Ambulatory Visit: Payer: Self-pay | Admitting: Family Medicine

## 2019-07-10 DIAGNOSIS — I10 Essential (primary) hypertension: Secondary | ICD-10-CM

## 2019-07-10 DIAGNOSIS — M19049 Primary osteoarthritis, unspecified hand: Secondary | ICD-10-CM

## 2019-07-10 MED FILL — AMLODIPINE BESYLATE 10 MG T: 10 | 30 days supply | Qty: 30 | Fill #3

## 2019-07-10 MED FILL — GABAPENTIN 300 MG CAPSULE: 300 | 30 days supply | Qty: 30 | Fill #3

## 2019-07-10 MED FILL — PANTOPRAZOLE SOD DR 40 MG T: 40 | 30 days supply | Qty: 30 | Fill #3

## 2019-07-10 MED FILL — glipiZIDE XL 10 MG TB24: 10 | 30 days supply | Qty: 60 | Fill #3

## 2019-07-10 MED FILL — metFORMIN HCL 1000 MG TABS: 1000 | 30 days supply | Qty: 75 | Fill #3

## 2019-07-10 MED FILL — LOSARTAN-HCTZ 50-12.5 MG TA: 50-12.5 | 90 days supply | Qty: 90 | Fill #1

## 2019-07-16 ENCOUNTER — Other Ambulatory Visit: Payer: Self-pay | Admitting: Family Medicine

## 2019-07-16 DIAGNOSIS — M19049 Primary osteoarthritis, unspecified hand: Secondary | ICD-10-CM

## 2019-07-17 MED FILL — TRUEPLUS PEN NDL 32GX5/32: 32G X 4 MM | 90 days supply | Qty: 100 | Fill #1

## 2019-07-17 MED FILL — TRUEPLUS PEN NDL 32GX5/32": 32G X 4 MM | 90 days supply | Qty: 100 | Fill #1

## 2019-07-18 ENCOUNTER — Encounter: Payer: Self-pay | Admitting: Registered Nurse

## 2019-07-18 ENCOUNTER — Other Ambulatory Visit: Payer: Self-pay

## 2019-07-18 ENCOUNTER — Ambulatory Visit (INDEPENDENT_AMBULATORY_CARE_PROVIDER_SITE_OTHER): Payer: Medicare Other | Admitting: Registered Nurse

## 2019-07-18 VITALS — BP 147/80 | HR 89 | Temp 97.2°F | Ht 64.0 in | Wt 151.5 lb

## 2019-07-18 DIAGNOSIS — Z7689 Persons encountering health services in other specified circumstances: Secondary | ICD-10-CM

## 2019-07-18 DIAGNOSIS — Z13228 Encounter for screening for other metabolic disorders: Secondary | ICD-10-CM | POA: Diagnosis not present

## 2019-07-18 DIAGNOSIS — Z23 Encounter for immunization: Secondary | ICD-10-CM

## 2019-07-18 DIAGNOSIS — Z13220 Encounter for screening for lipoid disorders: Secondary | ICD-10-CM | POA: Diagnosis not present

## 2019-07-18 DIAGNOSIS — R21 Rash and other nonspecific skin eruption: Secondary | ICD-10-CM

## 2019-07-18 DIAGNOSIS — M19049 Primary osteoarthritis, unspecified hand: Secondary | ICD-10-CM

## 2019-07-18 DIAGNOSIS — I10 Essential (primary) hypertension: Secondary | ICD-10-CM | POA: Diagnosis not present

## 2019-07-18 DIAGNOSIS — K219 Gastro-esophageal reflux disease without esophagitis: Secondary | ICD-10-CM

## 2019-07-18 DIAGNOSIS — Z1329 Encounter for screening for other suspected endocrine disorder: Secondary | ICD-10-CM

## 2019-07-18 DIAGNOSIS — Z130 Encounter for screening for diseases of the blood and blood-forming organs and certain disorders involving the immune mechanism: Secondary | ICD-10-CM | POA: Diagnosis not present

## 2019-07-18 DIAGNOSIS — Z794 Long term (current) use of insulin: Secondary | ICD-10-CM | POA: Diagnosis not present

## 2019-07-18 DIAGNOSIS — E1142 Type 2 diabetes mellitus with diabetic polyneuropathy: Secondary | ICD-10-CM

## 2019-07-18 DIAGNOSIS — E782 Mixed hyperlipidemia: Secondary | ICD-10-CM

## 2019-07-18 LAB — POCT GLYCOSYLATED HEMOGLOBIN (HGB A1C): Hemoglobin A1C: 7.6 % — AB (ref 4.0–5.6)

## 2019-07-18 MED ORDER — LOSARTAN POTASSIUM-HCTZ 50-12.5 MG PO TABS: 1.0000 | ORAL_TABLET | Freq: Every day | ORAL | 5 refills | Status: DC

## 2019-07-18 MED ORDER — ATORVASTATIN CALCIUM 40 MG PO TABS: 40.0000 mg | ORAL_TABLET | Freq: Every day | ORAL | 3 refills | Status: DC

## 2019-07-18 MED ORDER — AMLODIPINE BESYLATE 10 MG PO TABS: 10.0000 mg | ORAL_TABLET | Freq: Every day | ORAL | 5 refills | Status: DC

## 2019-07-18 MED ORDER — BETAMETHASONE DIPROPIONATE 0.05 % EX CREA: TOPICAL_CREAM | Freq: Two times a day (BID) | CUTANEOUS | 0 refills | Status: DC

## 2019-07-18 MED ORDER — GABAPENTIN 300 MG PO CAPS: 300.0000 mg | ORAL_CAPSULE | Freq: Every day | ORAL | 5 refills | Status: DC

## 2019-07-18 MED ORDER — METFORMIN HCL 1000 MG PO TABS: 1000.0000 mg | ORAL_TABLET | Freq: Two times a day (BID) | ORAL | 0 refills | Status: DC

## 2019-07-18 MED ORDER — LANTUS SOLOSTAR 100 UNIT/ML ~~LOC~~ SOPN: 30.0000 [IU] | PEN_INJECTOR | Freq: Every day | SUBCUTANEOUS | 5 refills | Status: DC

## 2019-07-18 MED ORDER — DICLOFENAC SODIUM 75 MG PO TBEC: 75.0000 mg | DELAYED_RELEASE_TABLET | Freq: Two times a day (BID) | ORAL | 2 refills | Status: DC

## 2019-07-18 MED ORDER — PANTOPRAZOLE SODIUM 40 MG PO TBEC: 40.0000 mg | DELAYED_RELEASE_TABLET | Freq: Every day | ORAL | 5 refills | Status: DC

## 2019-07-18 MED ORDER — METFORMIN HCL 1000 MG PO TABS: ORAL_TABLET | ORAL | 5 refills | Status: DC

## 2019-07-18 MED ORDER — KETOCONAZOLE 2 % EX CREA: 1.0000 "application " | TOPICAL_CREAM | Freq: Every day | CUTANEOUS | 0 refills | Status: DC

## 2019-07-18 MED ORDER — GLIPIZIDE ER 10 MG PO TB24: 20.0000 mg | ORAL_TABLET | Freq: Every day | ORAL | 5 refills | Status: DC

## 2019-07-18 MED FILL — PRAVASTATIN NA 40 MG TAB: 40 | 30 days supply | Qty: 30 | Fill #0

## 2019-07-18 MED FILL — DICLOFENAC SOD EC 75 MG TAB: 75 | 30 days supply | Qty: 60 | Fill #0

## 2019-07-18 NOTE — Patient Instructions (Addendum)
If you have lab work done today you will be contacted with your lab results within the next 2 weeks.  If you have not heard from [place] then please contact us. The fastest way to get your results is to register for My Chart.   IF you received an x-ray today, you will receive an invoice from [facility]. Please contact [facility] at [phone] with questions or concerns regarding your invoice.   IF you received labwork today, you will receive an invoice from [place]. Please contact [facility] at [phone] with questions or concerns regarding your invoice.   Our billing staff will not be able to assist you with questions regarding bills from these companies.  You will be contacted with the lab results as soon as they are available. The fastest way to get your results is to activate your My Chart account. Instructions are located on the last page of this paperwork. If you have not heard from [place] regarding the results in 2 weeks, please contact this office.      Diabetes Basics  Diabetes (diabetes mellitus) is a long-term (chronic) disease. It occurs when the body does not properly use sugar (glucose) that is released from food after you eat. Diabetes may be caused by one or both of these problems:  Your pancreas does not make enough of a hormone called insulin.  Your body does not react in a normal way to insulin that it makes. Insulin lets sugars (glucose) go into cells in your body. This gives you energy. If you have diabetes, sugars cannot get into cells. This causes high blood sugar (hyperglycemia). Follow these instructions at home: How is diabetes treated? You may need to take insulin or other diabetes medicines daily to keep your blood sugar in balance. Take your diabetes medicines every day as told by your doctor. List your diabetes medicines here: Diabetes medicines  Name of medicine: ______________________________ ? Amount (dose): _______________ Time  (a.m./p.m.): _______________ Notes: ___________________________________  Name of medicine: ______________________________ ? Amount (dose): _______________ Time (a.m./p.m.): _______________ Notes: ___________________________________  Name of medicine: ______________________________ ? Amount (dose): _______________ Time (a.m./p.m.): _______________ Notes: ___________________________________ If you use insulin, you will learn how to give yourself insulin by injection. You may need to adjust the amount based on the food that you eat. List the types of insulin you use here: Insulin  Insulin type: ______________________________ ? Amount (dose): _______________ Time (a.m./p.m.): _______________ Notes: ___________________________________  Insulin type: ______________________________ ? Amount (dose): _______________ Time (a.m./p.m.): _______________ Notes: ___________________________________  Insulin type: ______________________________ ? Amount (dose): _______________ Time (a.m./p.m.): _______________ Notes: ___________________________________  Insulin type: ______________________________ ? Amount (dose): _______________ Time (a.m./p.m.): _______________ Notes: ___________________________________  Insulin type: ______________________________ ? Amount (dose): _______________ Time (a.m./p.m.): _______________ Notes: ___________________________________ How do I manage my blood sugar?  Check your blood sugar levels using a blood glucose monitor as directed by your doctor. Your doctor will set treatment goals for you. Generally, you should have these blood sugar levels:  Before meals (preprandial): 80-130 mg/dL (4.4-7.2 mmol/L).  After meals (postprandial): below 180 mg/dL (10 mmol/L).  A1c level: less than 7%. Write down the times that you will check your blood sugar levels: Blood sugar checks  Time: _______________ Notes: ___________________________________  Time: _______________ Notes:  ___________________________________  Time: _______________ Notes: ___________________________________  Time: _______________ Notes: ___________________________________  Time: _______________ Notes: ___________________________________  Time: _______________ Notes: ___________________________________  What do I need to know about low blood sugar? Low blood sugar is called hypoglycemia. This is when blood sugar  is at or below 70 mg/dL (3.9 mmol/L). Symptoms may include:  Feeling: ? Hungry. ? Worried or nervous (anxious). ? Sweaty and clammy. ? Confused. ? Dizzy. ? Sleepy. ? Sick to your stomach (nauseous).  Having: ? A fast heartbeat. ? A headache. ? A change in your vision. ? Tingling or no feeling (numbness) around the mouth, lips, or tongue. ? Jerky movements that you cannot control (seizure).  Having trouble with: ? Moving (coordination). ? Sleeping. ? Passing out (fainting). ? Getting upset easily (irritability). Treating low blood sugar To treat low blood sugar, eat or drink something sugary right away. If you can think clearly and swallow safely, follow the 15:15 rule:  Take 15 grams of a fast-acting carb (carbohydrate). Talk with your doctor about how much you should take.  Some fast-acting carbs are: ? Sugar tablets (glucose pills). Take 3-4 glucose pills. ? 6-8 pieces of hard candy. ? 4-6 oz (120-150 mL) of fruit juice. ? 4-6 oz (120-150 mL) of regular (not diet) soda. ? 1 Tbsp (15 mL) honey or sugar.  Check your blood sugar 15 minutes after you take the carb.  If your blood sugar is still at or below 70 mg/dL (3.9 mmol/L), take 15 grams of a carb again.  If your blood sugar does not go above 70 mg/dL (3.9 mmol/L) after 3 tries, get help right away.  After your blood sugar goes back to normal, eat a meal or a snack within 1 hour. Treating very low blood sugar If your blood sugar is at or below 54 mg/dL (3 mmol/L), you have very low blood sugar (severe  hypoglycemia). This is an emergency. Do not wait to see if the symptoms will go away. Get medical help right away. Call your local emergency services (911 in the [place] Do not drive yourself to the hospital. Questions to ask your health care provider  Do I need to meet with a diabetes educator?  What equipment will I need to care for myself at home?  What diabetes medicines do I need? When should I take them?  How often do I need to check my blood sugar?  What number can I call if I have questions?  When is my next doctor's visit?  Where can I find a support group for people with diabetes? Where to find more information  American Diabetes Association: www.diabetes.org  American Association of Diabetes Educators: www.diabeteseducator.org/patient-resources Contact a doctor if:  Your blood sugar is at or above 240 mg/dL (13.3 mmol/L) for 2 days in a row.  You have been sick or have had a fever for 2 days or more, and you are not getting better.  You have any of these problems for more than 6 hours: ? You cannot eat or drink. ? You feel sick to your stomach (nauseous). ? You throw up (vomit). ? You have watery poop (diarrhea). Get help right away if:  Your blood sugar is lower than 54 mg/dL (3 mmol/L).  You get confused.  You have trouble: ? Thinking clearly. ? Breathing. Summary  Diabetes (diabetes mellitus) is a long-term (chronic) disease. It occurs when the body does not properly use sugar (glucose) that is released from food after digestion.  Take insulin and diabetes medicines as told.  Check your blood sugar every day, as often as told.  Keep all follow-up visits as told by your doctor. This is important. This information is not intended to replace advice given to you by your health care provider. Make sure  you discuss any questions you have with your health care provider. Document Revised: 03/19/2019 Document Reviewed: 09/28/2017 Elsevier Patient Education   [place].  Diabetes Mellitus and Nutrition, Adult When you have diabetes (diabetes mellitus), it is very important to have healthy eating habits because your blood sugar (glucose) levels are greatly affected by what you eat and drink. Eating healthy foods in the appropriate amounts, at about the same times every day, can help you:  Control your blood glucose.  Lower your risk of heart disease.  Improve your blood pressure.  Reach or maintain a healthy weight. Every person with diabetes is different, and each person has different needs for a meal plan. Your health care provider may recommend that you work with a diet and nutrition specialist (dietitian) to make a meal plan that is best for you. Your meal plan may vary depending on factors such as:  The calories you need.  The medicines you take.  Your weight.  Your blood glucose, blood pressure, and cholesterol levels.  Your activity level.  Other health conditions you have, such as heart or kidney disease. How do carbohydrates affect me? Carbohydrates, also called carbs, affect your blood glucose level more than any other type of food. Eating carbs naturally raises the amount of glucose in your blood. Carb counting is a method for keeping track of how many carbs you eat. Counting carbs is important to keep your blood glucose at a healthy level, especially if you use insulin or take certain oral diabetes medicines. It is important to know how many carbs you can safely have in each meal. This is different for every person. Your dietitian can help you calculate how many carbs you should have at each meal and for each snack. Foods that contain carbs include:  Bread, cereal, rice, pasta, and crackers.  Potatoes and corn.  Peas, beans, and lentils.  Milk and yogurt.  Fruit and juice.  Desserts, such as cakes, cookies, ice cream, and candy. How does alcohol affect me? Alcohol can cause a sudden decrease in blood glucose  (hypoglycemia), especially if you use insulin or take certain oral diabetes medicines. Hypoglycemia can be a life-threatening condition. Symptoms of hypoglycemia (sleepiness, dizziness, and confusion) are similar to symptoms of having too much alcohol. If your health care provider says that alcohol is safe for you, follow these guidelines:  Limit alcohol intake to no more than 1 drink per day for nonpregnant women and 2 drinks per day for men. One drink equals 12 oz of beer, 5 oz of wine, or 1 oz of hard liquor.  Do not drink on an empty stomach.  Keep yourself hydrated with water, diet soda, or unsweetened iced tea.  Keep in mind that regular soda, juice, and other mixers may contain a lot of sugar and must be counted as carbs. What are tips for following this plan?  Reading food labels  Start by checking the serving size on the "Nutrition Facts" label of packaged foods and drinks. The amount of calories, carbs, fats, and other nutrients listed on the label is based on one serving of the item. Many items contain more than one serving per package.  Check the total grams (g) of carbs in one serving. You can calculate the number of servings of carbs in one serving by dividing the total carbs by 15. For example, if a food has 30 g of total carbs, it would be equal to 2 servings of carbs.  Check the number of  grams (g) of saturated and trans fats in one serving. Choose foods that have low or no amount of these fats.  Check the number of milligrams (mg) of salt (sodium) in one serving. Most people should limit total sodium intake to less than 2,300 mg per day.  Always check the nutrition information of foods labeled as "low-fat" or "nonfat". These foods may be higher in added sugar or refined carbs and should be avoided.  Talk to your dietitian to identify your daily goals for nutrients listed on the label. Shopping  Avoid buying canned, premade, or processed foods. These foods tend to be high  in fat, sodium, and added sugar.  Shop around the outside edge of the grocery store. This includes fresh fruits and vegetables, bulk grains, fresh meats, and fresh dairy. Cooking  Use low-heat cooking methods, such as baking, instead of high-heat cooking methods like deep frying.  Cook using healthy oils, such as olive, canola, or sunflower oil.  Avoid cooking with butter, cream, or high-fat meats. Meal planning  Eat meals and snacks regularly, preferably at the same times every day. Avoid going long periods of time without eating.  Eat foods high in fiber, such as fresh fruits, vegetables, beans, and whole grains. Talk to your dietitian about how many servings of carbs you can eat at each meal.  Eat 4-6 ounces (oz) of lean protein each day, such as lean meat, chicken, fish, eggs, or tofu. One oz of lean protein is equal to: ? 1 oz of meat, chicken, or fish. ? 1 egg. ?  cup of tofu.  Eat some foods each day that contain healthy fats, such as avocado, nuts, seeds, and fish. Lifestyle  Check your blood glucose regularly.  Exercise regularly as told by your health care provider. This may include: ? 150 minutes of moderate-intensity or vigorous-intensity exercise each week. This could be brisk walking, biking, or water aerobics. ? Stretching and doing strength exercises, such as yoga or weightlifting, at least 2 times a week.  Take medicines as told by your health care provider.  Do not use any products that contain nicotine or tobacco, such as cigarettes and e-cigarettes. If you need help quitting, ask your health care provider.  Work with a Social worker or diabetes educator to identify strategies to manage stress and any emotional and social challenges. Questions to ask a health care provider  Do I need to meet with a diabetes educator?  Do I need to meet with a dietitian?  What number can I call if I have questions?  When are the best times to check my blood glucose? Where to  find more information:  American Diabetes Association: diabetes.org  Academy of Nutrition and Dietetics: www.eatright.[employer] of Diabetes and Digestive and Kidney Diseases (NIH): [employer] Summary  A healthy meal plan will help you control your blood glucose and maintain a healthy lifestyle.  Working with a diet and nutrition specialist (dietitian) can help you make a meal plan that is best for you.  Keep in mind that carbohydrates (carbs) and alcohol have immediate effects on your blood glucose levels. It is important to count carbs and to use alcohol carefully. This information is not intended to replace advice given to you by your health care provider. Make sure you discuss any questions you have with your health care provider. Document Revised: 06/08/2017 Document Reviewed: 07/31/2016 Elsevier Patient Education  [place].  Diabetic Neuropathy Diabetic neuropathy refers to nerve damage that is caused by  diabetes (diabetes mellitus). Over time, people with diabetes can develop nerve damage throughout the body. There are several types of diabetic neuropathy:  Peripheral neuropathy. This is the most common type of diabetic neuropathy. It causes damage to nerves that carry signals between the spinal cord and other parts of the body (peripheral nerves). This usually affects nerves in the feet and legs first, and may eventually affect the hands and arms. The damage affects the ability to sense touch or temperature.  Autonomic neuropathy. This type causes damage to nerves that control involuntary functions (autonomic nerves). These nerves carry signals that control: ? Heartbeat. ? Body temperature. ? Blood pressure. ? Urination. ? Digestion. ? Sweating. ? Sexual function. ? Response to changing blood sugar (glucose) levels.  Focal neuropathy. This type of nerve damage affects one area of the body, such as an arm, a leg, or the face. The injury may  involve one nerve or a small group of nerves. Focal neuropathy can be painful and unpredictable, and occurs most often in older adults with diabetes. This often develops suddenly, but usually improves over time and does not cause long-term problems.  Proximal neuropathy. This type of nerve damage affects the nerves of the thighs, hips, buttocks, or legs. It causes severe pain, weakness, and muscle death (atrophy), usually in the thigh muscles. It is more common among older men and people who have type 2 diabetes. The length of recovery time may vary. What are the causes? Peripheral, autonomic, and focal neuropathies are caused by diabetes that is not well controlled with treatment. The cause of proximal neuropathy is not known, but it may be caused by inflammation related to uncontrolled blood glucose levels. What are the signs or symptoms? Peripheral neuropathy Peripheral neuropathy develops slowly over time. When the nerves of the feet and legs no longer work, you may experience:  Burning, stabbing, or aching pain in the legs or feet.  Pain or cramping in the legs or feet.  Loss of feeling (numbness) and inability to feel pressure or pain in the feet. This can lead to: ? Thick calluses or sores on areas of constant pressure. ? Ulcers. ? Reduced ability to feel temperature changes.  Foot deformities.  Muscle weakness.  Loss of balance or coordination. Autonomic neuropathy The symptoms of autonomic neuropathy vary depending on which nerves are affected. Symptoms may include:  Problems with digestion, such as: ? Nausea or vomiting. ? Poor appetite. ? Bloating. ? Diarrhea or constipation. ? Trouble swallowing. ? Losing weight without trying to.  Problems with the heart, blood and lungs, such as: ? Dizziness, especially when standing up. ? Fainting. ? Shortness of breath. ? Irregular heartbeat.  Bladder problems, such as: ? Trouble starting or stopping urination. ? Leaking  urine. ? Trouble emptying the bladder. ? Urinary tract infections (UTIs).  Problems with other body functions, such as: ? Sweat. You may sweat too much or too little. ? Temperature. You might get hot easily. Or, you might feel cold more than usual. ? Sexual function. Men may not be able to get or maintain an erection. Women may have vaginal dryness and difficulty with arousal. Focal neuropathy Symptoms affect only one area of the body. Common symptoms include:  Numbness.  Tingling.  Burning pain.  Prickling feeling.  Very sensitive skin.  Weakness.  Inability to move (paralysis).  Muscle twitching.  Muscles getting smaller (wasting).  Poor coordination.  Double or blurred vision. Proximal neuropathy  Sudden, severe pain in the hip, thigh, or buttocks.  Pain may spread from the back into the legs (sciatica).  Pain and numbness in the arms and legs.  Tingling.  Loss of bladder control or bowel control.  Weakness and wasting of thigh muscles.  Difficulty getting up from a seated position.  Abdominal swelling.  Unexplained weight loss. How is this diagnosed? Diagnosis usually involves reviewing your medical history and any symptoms you have. Diagnosis varies depending on the type of neuropathy your health care provider suspects. Peripheral neuropathy Your health care provider will check areas that are affected by your nervous system (neurologic exam), such as your reflexes, how you move, and what you can feel. You may have other tests, such as:  Blood tests.  Removal and examination of fluid that surrounds the spinal cord (lumbar puncture).  CT scan.  MRI.  A test to check the nerves that control muscles (electromyogram, EMG).  Tests of how quickly messages pass through your nerves (nerve conduction velocity tests).  Removal of a small piece of nerve to be examined under a microscope (biopsy). Autonomic neuropathy You may have tests, such as:  Tests to  measure your blood pressure and heart rate. This may include monitoring you while you are safely secured to an exam table that moves you from a lying position to an upright position (table tilt test).  Breathing tests to check your lungs.  Tests to check how food moves through the digestive system (gastric emptying tests).  Blood, sweat, or urine tests.  Ultrasound of your bladder.  Spinal fluid tests. Focal neuropathy This condition may be diagnosed with:  A neurologic exam.  CT scan.  MRI.  EMG.  Nerve conduction velocity tests. Proximal neuropathy There is no test to diagnose this type of neuropathy. You may have tests to rule out other possible causes of this type of neuropathy. Tests may include:  X-rays of your spine and lumbar region.  Lumbar puncture.  MRI. How is this treated? The goal of treatment is to keep nerve damage from getting worse. The most important part of treatment is keeping your blood glucose level and your A1C level within your target range by following your diabetes management plan. Over time, maintaining lower blood glucose levels helps lessen symptoms. In some cases, you may need prescription pain medicine. Follow these instructions at home:  Lifestyle   Do not use any products that contain nicotine or tobacco, such as cigarettes and e-cigarettes. If you need help quitting, ask your health care provider.  Be physically active every day. Include strength training and balance exercises.  Follow a healthy meal plan.  Work with your health care provider to manage your blood pressure. General instructions  Follow your diabetes management plan as directed. ? Check your blood glucose levels as directed by your health care provider. ? Keep your blood glucose in your target range as directed by your health care provider. ? Have your A1C level checked at least two times a year, or as often as told by your health care provider.  Take over the  counter and prescription medicines only as told by your health care provider. This includes insulin and diabetes medicine.  Do not drive or use heavy machinery while taking prescription pain medicines.  Check your skin and feet every day for cuts, bruises, redness, blisters, or sores.  Keep all follow up visits as told by your health care provider. This is important. Contact a health care provider if:  You have burning, stabbing, or aching pain in your legs or  feet.  You are unable to feel pressure or pain in your feet.  You develop problems with digestion, such as: ? Nausea. ? Vomiting. ? Bloating. ? Constipation. ? Diarrhea. ? Abdominal pain.  You have difficulty with urination, such as inability: ? To control when you urinate (incontinence). ? To completely empty the bladder (retention).  You have palpitations.  You feel dizzy, weak, or faint when you stand up. Get help right away if:  You cannot urinate.  You have sudden weakness or loss of coordination.  You have trouble speaking.  You have pain or pressure in your chest.  You have an irregular heart beat.  You have sudden inability to move a part of your body. Summary  Diabetic neuropathy refers to nerve damage that is caused by diabetes. It can affect nerves throughout the entire body, causing numbness and pain in the arms, legs, digestive tract, heart, and other body systems.  Keep your blood glucose level and your blood pressure in your target range, as directed by your health care provider. This can help prevent neuropathy from getting worse.  Check your skin and feet every day for cuts, bruises, redness, blisters, or sores.  Do not use any products that contain nicotine or tobacco, such as cigarettes and e-cigarettes. If you need help quitting, ask your health care provider. This information is not intended to replace advice given to you by your health care provider. Make sure you discuss any questions you  have with your health care provider. Document Revised: 08/08/2017 Document Reviewed: 07/31/2016 Elsevier Patient Education  [place].  Diabetic Retinopathy Diabetic retinopathy is a disease of the retina. The retina is a light-sensitive membrane at the back of the eye. Retinopathy is a complication of diabetes (diabetes mellitus) and a common cause of bad eyesight (visual impairment). It can eventually cause blindness. Early detection and treatment of diabetic retinopathy is important in keeping your eyes healthy and preventing further damage to them. What are the causes? Diabetic retinopathy is caused by blood sugar (glucose) levels that are too high for an extended period of time. High blood glucose over an extended period of time can:  Damage small blood vessels in the retina, allowing blood to leak through the vessel walls.  Cause new, abnormal blood vessels to grow on the retina. This can scar the retina in the advanced stage of diabetic retinopathy. What increases the risk? You are more likely to develop this condition if:  You have had diabetes for a long time.  You have poorly controlled blood glucose.  You have high blood pressure. What are the signs or symptoms? In the early stages of diabetic retinopathy, there are often no symptoms. As the condition gets worse, symptoms may include:  Blurred vision. This is usually caused by swelling due to abnormal blood glucose levels. The blurriness may go away when blood glucose levels return to normal.  Moving specks or dark spots (floaters) in your vision. These can be caused by a small amount of bleeding (hemorrhage) from retinal blood vessels.  Missing parts of your field of vision, such as vision at the sides of the eyes. This can be caused by larger retinal hemorrhages.  Difficulty reading.  Double vision.  Pain in one or both eyes.  Feeling pressure in one or both eyes.  Trouble seeing straight lines. Straight  lines may not look straight.  Redness of the eyes that does not go away. How is this diagnosed? This condition may be diagnosed with  an eye exam in which your eye care specialist puts drops in your eyes that enlarge (dilate) your pupils. This lets your health care provider examine your retina and check for changes in your retinal blood vessels. How is this treated? This condition may be treated by:  Keeping your blood glucose and blood pressure within a target range.  Using a type of laser beam to seal your retinal blood vessels. This stops them from bleeding and decreases pressure in your eye.  Getting shots of medicine in the eye to reduce swelling of the center of the retina (macula). You may be given: ? Anti-VEGF medicine. This medicine can help slow vision loss, and may even improve vision. ? Steroid medicine. Follow these instructions at home:   Follow your diabetes management plan as directed by your health care provider. This may include exercising regularly and eating a healthy diet.  Keep your blood glucose level and your blood pressure in your target range, as directed by your health care provider.  Check your blood glucose as often as directed.  Take over the counter and prescription medicines only as told by your health care provider. This includes insulin and oral diabetes medicine.  Get your eyes checked at least once every year. An eye specialist can usually see diabetic retinopathy developing long before it starts to cause problems. In many cases, it can be treated to prevent complications from occurring.  Do not use any products that contain nicotine or tobacco, such as cigarettes and e-cigarettes. If you need help quitting, ask your health care provider.  Keep all follow-up visits as told by your health care provider. This is important. Contact a health care provider if:  You notice gradual blurring or other changes in your vision over time.  You notice that your  glasses or contact lenses do not make things look as sharp as they once did.  You have trouble reading or seeing details at a distance with either eye.  You notice a change in your vision or notice that parts of your field of vision appear missing or hazy.  You suddenly see moving specks or dark spots in the field of vision of either eye. Get help right away if:  You have sudden pain or pressure in one or both eyes.  You suddenly lose vision or a curtain or veil seems to come across your eyes.  You have a sudden burst of floaters in your vision. Summary  Diabetic retinopathy is a disease of the retina. The retina is a light-sensitive membrane at the back of the eye. Retinopathy is a complication of diabetes.  Get your eyes checked at least once every year. An eye specialist can usually see diabetic retinopathy developing long before it starts to cause problems. In many cases, it can be treated to prevent complications from occurring.  Keep your blood glucose and your blood pressure in target range. Follow your diabetes management plan as directed by your health care provider.  Protect your eyes. Wear sunglasses and eye protection when needed. This information is not intended to replace advice given to you by your health care provider. Make sure you discuss any questions you have with your health care provider. Document Revised: 08/08/2017 Document Reviewed: 07/31/2016 Elsevier Patient Education  2020 [place] With Diabetes Diabetes (type 1 diabetes mellitus or type 2 diabetes mellitus) is a condition in which the body does not have enough of a hormone called insulin, or the body does not respond properly  to insulin. Normally, insulin allows sugars (glucose) to enter cells in the body. The cells use glucose for energy. With diabetes, extra glucose builds up in the blood instead of going into cells, which results in high blood glucose (hyperglycemia). How to manage lifestyle  changes Managing diabetes includes medical treatments as well as lifestyle changes. If diabetes is not managed well, serious physical and emotional complications can occur. Taking good care of yourself means that you are responsible for:  Monitoring glucose regularly.  Eating a healthy diet.  Exercising regularly.  Meeting with health care providers.  Taking medicines as directed. Some people may feel a lot of stress about managing their diabetes. This is known as emotional distress, and it is very common. Living with diabetes can place you at risk for emotional distress, depression, or anxiety. These disorders can be confusing and can make diabetes management more difficult. How to recognize stress Emotional distress Symptoms of emotional distress include:  Anger about having a diagnosis of diabetes.  Fear or frustration about your diagnosis and the changes you need to make to manage the condition.  Being overly worried about the care that you need or the cost of the care that you need.  Feeling like you caused your condition by doing something wrong.  Fear of unpredictable situations, like low or high blood glucose.  Feeling judged by your health care providers.  Feeling very alone with the disease.  Getting too tired or worn out with the demands of daily care. Depression Having diabetes means that you are at a higher risk for depression. Having depression also means that you are at a higher risk for diabetes. Your health care provider may test (screen) you for symptoms of depression. It is important to recognize depression symptoms and to start treatment for depression soon after it is diagnosed. The following are some symptoms of depression:  Loss of interest in things that you used to enjoy.  Trouble sleeping, or often waking up early and not being able to get back to sleep.  A change in appetite.  Feeling tired most of the day.  Feeling nervous and anxious.  Feeling  guilty and worrying that you are a burden to others.  Feeling depressed more often than you do not feel that way.  Thoughts of hurting yourself or feeling that you want to die. If you have any of these symptoms for 2 weeks or longer, reach out to a health care provider. Follow these instructions at home: Managing emotional distress The following are some ways to manage emotional distress:  Talk with your health care provider or certified diabetes educator. Consider working with a counselor or therapist.  Learn as much as you can about diabetes and its treatment. Meet with a certified diabetes educator or take a class to learn how to manage your condition.  Keep a journal of your thoughts and concerns.  Accept that some things are out of your control.  Talk with other people who have diabetes. It can help to talk with others about the emotional distress that you feel.  Find ways to manage stress that work for you. These may include art or music therapy, exercise, meditation, and hobbies.  Seek support from spiritual leaders, family, and friends. General instructions  Follow your diabetes management plan.  Keep all follow-up visits as told by your health care provider. This is important. Where to find support   Ask your health care provider to recommend a therapist who understands both depression and  diabetes.  Search for information and support from the American Diabetes Association: www.diabetes.org  Find a certified diabetes educator and make an appointment through [place] of Diabetes Educators: www.diabeteseducator.org Get help right away if:  You have thoughts about hurting yourself or others. If you ever feel like you may hurt yourself or others, or have thoughts about taking your own life, get help right away. You can go to your nearest emergency department or call:  Your local emergency services (911 in the [place]  A suicide crisis helpline, such as the  [place] at (716) 434-3014. This is open 24 hours a day. Summary  Diabetes (type 1 diabetes mellitus or type 2 diabetes mellitus) is a condition in which the body does not have enough of a hormone called insulin, or the body does not respond properly to insulin.  Living with diabetes puts you at risk for medical issues, and it also puts you at risk for emotional issues such as emotional distress, depression, and anxiety.  Recognizing the symptoms of emotional distress and depression may help you avoid problems with your diabetes control. It is important to start treatment for emotional distress and depression soon after they are diagnosed.  Having diabetes means that you are at a higher risk for depression. Ask your health care provider to recommend a therapist who understands both depression and diabetes.  If you experience symptoms of emotional distress or depression, it is important to discuss this with your health care provider, certified diabetes educator, or therapist. This information is not intended to replace advice given to you by your health care provider. Make sure you discuss any questions you have with your health care provider. Document Revised: 07/08/2018 Document Reviewed: 11/09/2016 Elsevier Patient Education  [place].  Prediabetes Eating Plan Prediabetes is a condition that causes blood sugar (glucose) levels to be higher than normal. This increases the risk for developing diabetes. In order to prevent diabetes from developing, your health care provider may recommend a diet and other lifestyle changes to help you:  Control your blood glucose levels.  Improve your cholesterol levels.  Manage your blood pressure. Your health care provider may recommend working with a diet and nutrition specialist (dietitian) to make a meal plan that is best for you. What are tips for following this plan? Lifestyle  Set weight loss goals with the help  of your health care team. It is recommended that most people with prediabetes lose 7% of their current body weight.  Exercise for at least 30 minutes at least 5 days a week.  Attend a support group or seek ongoing support from a mental health counselor.  Take over-the-counter and prescription medicines only as told by your health care provider. Reading food labels  Read food labels to check the amount of fat, salt (sodium), and sugar in prepackaged foods. Avoid foods that have: ? Saturated fats. ? Trans fats. ? Added sugars.  Avoid foods that have more than 300 milligrams (mg) of sodium per serving. Limit your daily sodium intake to less than 2,300 mg each day. Shopping  Avoid buying pre-made and processed foods. Cooking  Cook with olive oil. Do not use butter, lard, or ghee.  Bake, broil, grill, or boil foods. Avoid frying. Meal planning   Work with your dietitian to develop an eating plan that is right for you. This may include: ? Tracking how many calories you take in. Use a food diary, notebook, or mobile application to track what you eat  at each meal. ? Using the glycemic index (GI) to plan your meals. The index tells you how quickly a food will raise your blood glucose. Choose low-GI foods. These foods take a longer time to raise blood glucose.  Consider following a Mediterranean diet. This diet includes: ? Several servings each day of fresh fruits and vegetables. ? Eating fish at least twice a week. ? Several servings each day of whole grains, beans, nuts, and seeds. ? Using olive oil instead of other fats. ? Moderate alcohol consumption. ? Eating small amounts of red meat and whole-fat dairy.  If you have high blood pressure, you may need to limit your sodium intake or follow a diet such as the DASH eating plan. DASH is an eating plan that aims to lower high blood pressure. What foods are recommended? The items listed below may not be a complete list. Talk with your  dietitian about what dietary choices are best for you. Grains Whole grains, such as whole-wheat or whole-grain breads, crackers, cereals, and pasta. Unsweetened oatmeal. Bulgur. Barley. Quinoa. Brown rice. Corn or whole-wheat flour tortillas or taco shells. Vegetables Lettuce. Spinach. Peas. Beets. Cauliflower. Cabbage. Broccoli. Carrots. Tomatoes. Squash. Eggplant. Herbs. Peppers. Onions. Cucumbers. Brussels sprouts. Fruits Berries. Bananas. Apples. Oranges. Grapes. Papaya. Mango. Pomegranate. Kiwi. Grapefruit. Cherries. Meats and other protein foods Seafood. Poultry without skin. Lean cuts of pork and beef. Tofu. Eggs. Nuts. Beans. Dairy Low-fat or fat-free dairy products, such as yogurt, cottage cheese, and cheese. Beverages Water. Tea. Coffee. Sugar-free or diet soda. Seltzer water. Lowfat or no-fat milk. Milk alternatives, such as soy or almond milk. Fats and oils Olive oil. Canola oil. Sunflower oil. Grapeseed oil. Avocado. Walnuts. Sweets and desserts Sugar-free or low-fat pudding. Sugar-free or low-fat ice cream and other frozen treats. Seasoning and other foods Herbs. Sodium-free spices. Mustard. Relish. Low-fat, low-sugar ketchup. Low-fat, low-sugar barbecue sauce. Low-fat or fat-free mayonnaise. What foods are not recommended? The items listed below may not be a complete list. Talk with your dietitian about what dietary choices are best for you. Grains Refined white flour and flour products, such as bread, pasta, snack foods, and cereals. Vegetables Canned vegetables. Frozen vegetables with butter or cream sauce. Fruits Fruits canned with syrup. Meats and other protein foods Fatty cuts of meat. Poultry with skin. Breaded or fried meat. Processed meats. Dairy Full-fat yogurt, cheese, or milk. Beverages Sweetened drinks, such as sweet iced tea and soda. Fats and oils Butter. Lard. Ghee. Sweets and desserts Baked goods, such as cake, cupcakes, pastries, cookies, and  cheesecake. Seasoning and other foods Spice mixes with added salt. Ketchup. Barbecue sauce. Mayonnaise. Summary  To prevent diabetes from developing, you may need to make diet and other lifestyle changes to help control blood sugar, improve cholesterol levels, and manage your blood pressure.  Set weight loss goals with the help of your health care team. It is recommended that most people with prediabetes lose 7 percent of their current body weight.  Consider following a Mediterranean diet that includes plenty of fresh fruits and vegetables, whole grains, beans, nuts, seeds, fish, lean meat, low-fat dairy, and healthy oils. This information is not intended to replace advice given to you by your health care provider. Make sure you discuss any questions you have with your health care provider. Document Revised: 10/18/2018 Document Reviewed: 08/30/2016 Elsevier Patient Education  2020 [place].  Preventing Diabetes Mellitus Complications You can take action to prevent or slow down problems that are caused by diabetes (diabetes mellitus). Following  your diabetes plan and taking care of yourself can reduce your risk of serious or life-threatening complications. What actions can I take to prevent diabetes complications? Manage your diabetes   Follow instructions from your health care providers about managing your diabetes. Your diabetes may be managed by a team of health care providers who can teach you how to care for yourself and can answer questions that you have.  Educate yourself about your condition so you can make healthy choices about eating and physical activity.  Check your blood sugar (glucose) levels as often as directed. Your health care provider will help you decide how often to check your blood glucose level depending on your treatment goals and how well you are meeting them.  Ask your health care provider if you should take low-dose aspirin daily and what dose is recommended  for you. Taking low-dose aspirin daily is recommended to help prevent cardiovascular disease. Do not use nicotine or tobacco Do not use any products that contain nicotine or tobacco, such as cigarettes and e-cigarettes. If you need help quitting, ask your health care provider. Nicotine raises your risk for diabetes problems. If you quit using nicotine:  You will lower your risk for heart attack, stroke, nerve disease, and kidney disease.  Your cholesterol and blood pressure may improve.  Your blood circulation will improve. Keep your blood pressure under control Your personal target blood pressure is determined based on:  Your age.  Your medicines.  How long you have had diabetes.  Any other medical conditions you have. To control your blood pressure:  Follow instructions from your health care provider about meal planning, exercise, and medicines.  Make sure your health care provider checks your blood pressure at every medical visit.  Monitor your blood pressure at home as told by your health care provider.  Keep your cholesterol under control To control your cholesterol:  Follow instructions from your health care provider about meal planning, exercise, and medicines.  Have your cholesterol checked at least once a year.  You may be prescribed medicine to lower cholesterol (statin). If you are not taking a statin, ask your health care provider if you should be. Controlling your cholesterol may:  Help prevent heart disease and stroke. These are the most common health problems for people with diabetes.  Improve your blood flow. Schedule and keep yearly physical exams and eye exams Your health care provider will tell you how often you need medical visits depending on your diabetes management plan. Keep all follow-up visits as directed. This is important so possible problems can be identified early and complications can be avoided or treated.  Every visit with your health care  provider should include measuring your: ? Weight. ? Blood pressure. ? Blood glucose control.  Your A1c (hemoglobin A1c) level should be checked: ? At least 2 times a year, if you are meeting your treatment goals. ? 4 times a year, if you are not meeting treatment goals or if your treatment goals have changed.  Your blood lipids (lipid profile) should be checked yearly. You should also be checked yearly for protein in your urine (urine microalbumin).  If you have type 1 diabetes, get an eye exam 3-5 years after you are diagnosed, and then once a year after your first exam.  If you have type 2 diabetes, get an eye exam as soon as you are diagnosed, and then once a year after your first exam. Keep your vaccines current It is recommended that you receive:  A flu (influenza) vaccine every year.  A pneumonia (pneumococcal) vaccine and a hepatitis B vaccine. If you are age 58 or older, you may get the pneumonia vaccine as a series of two separate shots. Ask your health care provider which other vaccines may be recommended. Take care of your feet Diabetes may cause you to have poor blood circulation to your legs and feet. Because of this, taking care of your feet is very important. Diabetes can cause:  The skin on the feet to get thinner, break more easily, and heal more slowly.  Nerve damage in your legs and feet, which results in decreased feeling. You may not notice minor injuries that could lead to serious problems. To avoid foot problems:  Check your skin and feet every day for cuts, bruises, redness, blisters, or sores.  Schedule a foot exam with your health care provider once every year. This exam includes: ? Inspecting of the structure and skin of your feet. ? Checking the pulses and sensation in your feet.  Make sure that your health care provider performs a visual foot exam at every medical visit.  Take care of your teeth People with poorly controlled diabetes are more likely  to have gum (periodontal) disease. Diabetes can make periodontal diseases harder to control. If not treated, periodontal diseases can lead to tooth loss. To prevent this:  Brush your teeth twice a day.  Floss at least once a day.  Visit your dentist 2 times a year. Drink responsibly Limit alcohol intake to no more than 1 drink a day for nonpregnant women and 2 drinks a day for men. One drink equals 12 oz of beer, 5 oz of wine, or 1 oz of hard liquor.  It is important to eat food when you drink alcohol to avoid low blood glucose (hypoglycemia). Avoid alcohol if you:  Have a history of alcohol abuse or dependence.  Are pregnant.  Have liver disease, pancreatitis, advanced neuropathy, or severe hypertriglyceridemia. Lessen stress Living with diabetes can be stressful. When you are experiencing stress, your blood glucose may be affected in two ways:  Stress hormones may cause your blood glucose to rise.  You may be distracted from taking good care of yourself. Be aware of your stress level and make changes to help you manage challenging situations. To lower your stress levels:  Consider joining a support group.  Do planned relaxation or meditation.  Do a hobby that you enjoy.  Maintain healthy relationships.  Exercise regularly.  Work with your health care provider or a mental health professional. Summary  You can take action to prevent or slow down problems that are caused by diabetes (diabetes mellitus). Following your diabetes plan and taking care of yourself can reduce your risk of serious or life-threatening complications.  Follow instructions from your health care providers about managing your diabetes. Your diabetes may be managed by a team of health care providers who can teach you how to care for yourself and can answer questions that you have.  Your health care provider will tell you how often you need medical visits depending on your diabetes management plan. Keep all  follow-up visits as directed. This is important so possible problems can be identified early and complications can be avoided or treated. This information is not intended to replace advice given to you by your health care provider. Make sure you discuss any questions you have with your health care provider. Document Revised: 09/24/2017 Document Reviewed: 03/25/2016 Elsevier Patient Education  [phone]  [place] for Eating Away From Home If You Have Diabetes Controlling your blood sugar (glucose) levels can be challenging when you do not prepare your own meals. The following tips can help you manage your diabetes when you eat away from home. If you have questions or if you need help, work with your health care provider or diet and nutrition specialist (dietitian). Planning ahead Plan ahead if you know you will be eating away from home:  Try to eat your meals and snacks at about the same time each day. If you know your meal is going to be later than normal, make sure you have a small snack. Being very hungry can cause you to make unhealthy food choices.  Make a list of restaurants near you that offer healthy choices. If a restaurant has a carry-out menu, take the menu home and plan what you will order ahead of time.  Look up the restaurant you want to eat at online. Many chain and fast-food restaurants list nutritional information online. Use this information to choose the healthiest options and to calculate how many carbohydrates will be in your meal.  Use a carbohydrate-counting book or mobile app to look up the carbohydrate content and serving size of the foods you want to eat. Free foods A "free food" is any food or drink that has less than 5 grams of carbohydrates and less than 20 calories per serving. These food are high in fiber and nutrients and low in calories, carbohydrates, and fats. Free foods include:  Non-starchy vegetables, such as carrots, broccoli, celery, lettuce, or green  beans.  Non-sugar drinks, such as water, unsweetened coffee, or unsweetened tea.  Low-calorie salad dressings.  Sugar-free gelatin. Starting meals with a salad full of vegetables is a healthy choice that includes a lot of free foods. Avoid high-calorie salad toppings like bacon, cheese, and high-fat dressings. Ask for your salad dressing to be served on the side so that you dip your fork in the dressing and then in the salad. This allows you to control how much dressing you eat and still get the flavor with every bite. Choices to control carbohydrates   Ask your server to take away the bread basket or chips from your table.  Choose light yogurt or [place] yogurt instead of non-fat sweetened yogurt.  Order fresh fruit. A salad bar often offers fresh fruit choices. Avoid canned fruit because it is usually packed in sugar or syrup.  Order a salad, and ask for dressing on the side.  Ask for substitutes. For example, if your meal comes with french fries, ask for a side salad or steamed veggies instead. If a meal comes with fried chicken, ask for grilled chicken instead. Beverages  Choose drinks that are low in calories and sugar, such as: ? Water. ? Unsweetened tea or coffee. ? Lowfat milk.  Avoid the following drinks: ? Alcoholic beverages. ? Regular (not diet) sodas. Other tips  If you take insulin, wait to take your insulin once your food arrives to your table. This will ensure that your insulin and your food are timed correctly.  Become familiar with serving sizes and learn to recognize how many servings are in a portion. Restaurant portions are typically two to three times larger than what you really need.  Ask your server for a to-go box at the beginning of the meal. When your food comes, leave the amount you should have on your plate, and put the rest in the to-go box  so that you are not tempted to eat too much.  Consider splitting an entree with someone and ordering a side  salad.  Avoid buffets. They are typically too tempting and result in overeating. Where to find more information  American Diabetes Association: www.diabetes.org  American Association of Diabetes Educators: www.diabeteseducator.org Summary  Plan ahead when eating away from home.  Try to eat your meals and snacks at about the same time each day. If you know your meal is going to be later than normal, make sure you have a small snack. Being very hungry can cause you to make unhealthy food choices.  Ask for substitutes. For example, if your meal comes with french fries, ask for a side salad or steamed veggies instead. If a meal comes with fried chicken, ask for grilled chicken instead.  Ask for a to-go box when you order your meal. Divide your meal before you start eating. This information is not intended to replace advice given to you by your health care provider. Make sure you discuss any questions you have with your health care provider. Document Revised: 10/04/2016 Document Reviewed: 10/04/2016 Elsevier Patient Education  [place].  Type 2 Diabetes Mellitus, Self Care, Adult Caring for yourself after you have been diagnosed with type 2 diabetes (type 2 diabetes mellitus) means keeping your blood sugar (glucose) under control with a balance of:  Nutrition.  Exercise.  Lifestyle changes.  Medicines or insulin, if necessary.  Support from your team of health care providers and others. The following information explains what you need to know to manage your diabetes at home. What are the risks? Having diabetes can put you at risk for other long-term (chronic) conditions, such as heart disease and kidney disease. Your health care provider may prescribe medicines to help prevent complications from diabetes. These medicines may include:  Aspirin.  Medicine to lower cholesterol.  Medicine to control blood pressure. How to monitor blood glucose   Check your blood glucose  every day, as often as told by your health care provider.  Have your A1c (hemoglobin A1c) level checked two or more times a year, or as often as told by your health care provider. Your health care provider will set individualized treatment goals for you. Generally, the goal of treatment is to maintain the following blood glucose levels:  Before meals (preprandial): 80-130 mg/dL (4.4-7.2 mmol/L).  After meals (postprandial): below 180 mg/dL (10 mmol/L).  A1c level: less than 7%. How to manage hyperglycemia and hypoglycemia Hyperglycemia symptoms Hyperglycemia, also called high blood glucose, occurs when blood glucose is too high. Make sure you know the early signs of hyperglycemia, such as:  Increased thirst.  Hunger.  Feeling very tired.  Needing to urinate more often than usual.  Blurry vision. Hypoglycemia symptoms Hypoglycemia, also called low blood glucose, occurswith a blood glucose level at or below 70 mg/dL (3.9 mmol/L). The risk for hypoglycemia increases during or after exercise, during sleep, during illness, and when skipping meals or not eating for a long time (fasting). It is important to know the symptoms of hypoglycemia and treat it right away. Always have a 15-gram rapid-acting carbohydrate snack with you to treat low blood glucose. Family members and close friends should also know the symptoms and should understand how to treat hypoglycemia, in case you are not able to treat yourself. Symptoms may include:  Hunger.  Anxiety.  Sweating and feeling clammy.  Confusion.  Dizziness or feeling light-headed.  Sleepiness.  Nausea.  Increased heart rate.  Headache.  Blurry vision.  Irritability.  A change in coordination.  Tingling or numbness around the mouth, lips, or tongue.  Restless sleep.  Fainting.  Seizure. Treating hypoglycemia If you are alert and able to swallow safely, follow the 15:15 rule:  Take 15 grams of a rapid-acting  carbohydrate. Talk with your health care provider about how much you should take.  Rapid-acting options include: ? Glucose pills (take 15 grams). ? 6-8 pieces of hard candy. ? 4-6 oz (120-150 mL) of fruit juice. ? 4-6 oz (120-150 mL) of regular (not diet) soda. ? 1 Tbsp (15 mL) honey or sugar.  Check your blood glucose 15 minutes after you take the carbohydrate.  If the repeat blood glucose level is still at or below 70 mg/dL (3.9 mmol/L), take 15 grams of a carbohydrate again.  If your blood glucose level does not increase above 70 mg/dL (3.9 mmol/L) after 3 tries, seek emergency medical care.  After your blood glucose level returns to normal, eat a meal or a snack within 1 hour. Treating severe hypoglycemia Severe hypoglycemia is when your blood glucose level is at or below 54 mg/dL (3 mmol/L). Severe hypoglycemia is an emergency. Do not wait to see if the symptoms will go away. Get medical help right away. Call your local emergency services (911 in the [place] If you have severe hypoglycemia and you cannot eat or drink, you may need an injection of glucagon. A family member or close friend should learn how to check your blood glucose and how to give you a glucagon injection. Ask your health care provider if you need to have an emergency glucagon injection kit available. Severe hypoglycemia may need to be treated in a hospital. The treatment may include getting glucose through an IV. You may also need treatment for the cause of your hypoglycemia. Follow these instructions at home: Take diabetes medicines as told  If your health care provider prescribed insulin or diabetes medicines, take them every day.  Do not run out of insulin or other diabetes medicines that you take. Plan ahead so you always have these available.  If you use insulin, adjust your dosage based on how physically active you are and what foods you eat. Your health care provider will tell you how to adjust your  dosage. Make healthy food choices  The things that you eat and drink affect your blood glucose and your insulin dosage. Making good choices helps to control your diabetes and prevent other health problems. A healthy meal plan includes eating lean proteins, complex carbohydrates, fresh fruits and vegetables, low-fat dairy products, and healthy fats. Make an appointment to see a diet and nutrition specialist (registered dietitian) to help you create an eating plan that is right for you. Make sure that you:  Follow instructions from your health care provider about eating or drinking restrictions.  Drink enough fluid to keep your urine pale yellow.  Keep a record of the carbohydrates that you eat. Do this by reading food labels and learning the standard serving sizes of foods.  Follow your sick day plan whenever you cannot eat or drink as usual. Make this plan in advance with your health care provider.  Stay active Exercise regularly, as told by your health care provider. This may include:  Stretching and doing strength exercises, such as yoga or weightlifting, 2 or more times a week.  Doing 150 minutes or more of moderate-intensity or vigorous-intensity exercise each week. This could be brisk walking, biking, or water  aerobics. ? Spread out your activity over 3 or more days of the week. ? Do not go more than 2 days in a row without doing some kind of physical activity. When you start a new exercise or activity, work with your health care provider to adjust your insulin, medicines, or food intake as needed. Make healthy lifestyle choices  Do not use any tobacco products, such as cigarettes, chewing tobacco, and e-cigarettes. If you need help quitting, ask your health care provider.  If your health care provider says that alcohol is safe for you, limit alcohol intake to no more than 1 drink per day for nonpregnant women and 2 drinks per day for men. One drink equals 12 oz of beer (355 mL), 5 oz  of wine (148 mL), or 1 oz of hard liquor (44 mL).  Learn to manage stress. If you need help with this, ask your health care provider. Care for your body   Keep your immunizations up to date. In addition to getting vaccinations as told by your health care provider, it is recommended that you get vaccinated against the following illnesses: ? The flu (influenza). Get a flu shot every year. ? Pneumonia. ? Hepatitis B.  Schedule an eye exam soon after your diagnosis, and then one time every year after that.  Check your skin and feet every day for cuts, bruises, redness, blisters, or sores. Schedule a foot exam with your health care provider once every year.  Brush your teeth and gums two times a day, and floss one or more times a day. Visit your dentist one or more times every 6 months.  Maintain a healthy weight. General instructions  Take over-the-counter and prescription medicines only as told by your health care provider.  Share your diabetes management plan with people in your workplace, school, and household.  Carry a medical alert card or wear medical alert jewelry.  Keep all follow-up visits as told by your health care provider. This is important. Questions to ask your health care provider  Do I need to meet with a diabetes educator?  Where can I find a support group for people with diabetes? Where to find more information For more information about diabetes, visit:  American Diabetes Association (ADA): www.diabetes.org  American Association of Diabetes Educators (AADE): www.diabeteseducator.org Summary  Caring for yourself after you have been diagnosed with (type 2 diabetes mellitus) means keeping your blood sugar (glucose) under control with a balance of nutrition, exercise, lifestyle changes, and medicine.  Check your blood glucose every day, as often as told by your health care provider.  Having diabetes can put you at risk for other long-term (chronic) conditions,  such as heart disease and kidney disease. Your health care provider may prescribe medicines to help prevent complications from diabetes.  Keep all follow-up visits as told by your health care provider. This is important. This information is not intended to replace advice given to you by your health care provider. Make sure you discuss any questions you have with your health care provider. Document Revised: 12/17/2017 Document Reviewed: 07/30/2015 Elsevier Patient Education  2020 [employer].

## 2019-07-18 NOTE — Progress Notes (Signed)
Established Patient Office Visit  Subjective:  Patient ID: Jerry Serrano, male    DOB: 01-26-1951  Age: 69 y.o. MRN: [id number]  CC:  Chief Complaint  Patient presents with   Establish Care    establish care would like to talk about arthritis in both hands but the right hand hurts a little bit worse. started years ago and is taking diclofenac sodium for the pain   Medication Refill    diclofenac sodium 75 mg    HPI Jerry Serrano presents for visit to establish care as well as chronic condition management.  It has been around 7 mo since he was seen by his past PCP. Reports that he still has medication and has been compliant, but needs refills of his statin and diclofenac.   T2DM: neuropathies doing well. Has seen ophthalmology within past year, reports they have given him positive reports. Has next appt this month with Dr. Katy Fitch. Reports diet is poor but medication compliance has been ok.  Hyperlipidemia: Unsure if he is taking pravastatin or atorvastatin - has run out of both. Will restart atorvastatin at this time.  Arthritis: Both hands. Relieved by NSAIDs. Symptoms have not changed. He is currently out of diclofenac and would like a refill.  GERD: Requests refill of pantoprazole.   HTN: reports compliance. BP mildly elevated on arrival, however, reports he was rushing to visit. Denies chest pain, shob, doe, headaches, visual changes, and any other concerning symptoms.   Due for flu vaccine, will administer today.   Past Medical History:  Diagnosis Date   Allergy    Arthritis    Diabetes mellitus    Hyperlipidemia    Hypertension     Past Surgical History:  Procedure Laterality Date   HERNIA REPAIR      Family History  Problem Relation Age of Onset   Colon cancer Neg Hx    Esophageal cancer Neg Hx    Rectal cancer Neg Hx    Stomach cancer Neg Hx     Social History   Socioeconomic History   Marital status: Married    Spouse name: Not on  file   Number of children: Not on file   Years of education: Not on file   Highest education level: Not on file  Occupational History   Not on file  Tobacco Use   Smoking status: Former Smoker    Types: Cigarettes   Smokeless tobacco: Never Used   Tobacco comment: over 30 years ago  Substance and Sexual Activity   Alcohol use: No   Drug use: No   Sexual activity: Not on file  Other Topics Concern   Not on file  Social History Narrative   Not on file   Social Determinants of Health   Financial Resource Strain:    Difficulty of Paying Living Expenses: Not on file  Food Insecurity:    Worried About Running Out of Food in the Last Year: Not on file   [employer] of Food in the Last Year: Not on file  Transportation Needs:    Lack of Transportation (Medical): Not on file   Lack of Transportation (Non-Medical): Not on file  Physical Activity:    Days of Exercise per Week: Not on file   Minutes of Exercise per Session: Not on file  Stress:    Feeling of Stress : Not on file  Social Connections:    Frequency of Communication with Friends and Family: Not on file   Frequency of  Social Gatherings with Friends and Family: Not on file   Attends Religious Services: Not on file   Active Member of Clubs or Organizations: Not on file   Attends Archivist Meetings: Not on file   Marital Status: Not on file  Intimate Partner Violence:    Fear of Current or Ex-Partner: Not on file   Emotionally Abused: Not on file   Physically Abused: Not on file   Sexually Abused: Not on file    Outpatient Medications Prior to Visit  Medication Sig Dispense Refill   aspirin EC 81 MG tablet Take 1 tablet (81 mg total) by mouth daily. 30 tablet 3   Blood Glucose Monitoring Suppl (ACCU-CHEK AVIVA PLUS) w/Device KIT USE AS DIRECTED 4 TIMES DAILY AFTER MEALS AND AT BEDTIME. 1 kit 0   glucose blood test strip USE AS DIRECTED THREE TIMES DAILY 100 each 12   Insulin  Pen Needle (BD PEN NEEDLE NANO 2ND GEN) 32G X 4 MM MISC Use as instructed to inject insulin daily. 100 each 2   Lancet Devices (ACCU-CHEK SOFTCLIX) lancets Use as instructed 1 each 12   MICROLET LANCETS MISC Test 3 times per day 100 each 12   olopatadine (PATANOL) 0.1 % ophthalmic solution Place 1 drop into both eyes 2 (two) times daily.     pravastatin (PRAVACHOL) 40 MG tablet TAKE 1 TABLET (40 MG TOTAL) BY MOUTH DAILY. 30 tablet 0   sildenafil (VIAGRA) 50 MG tablet Take 1 tablet (50 mg total) by mouth daily as needed for erectile dysfunction. 10 tablet 1   amLODipine (NORVASC) 10 MG tablet Take 1 tablet (10 mg total) by mouth daily. 30 tablet 5   atorvastatin (LIPITOR) 40 MG tablet Take 1 tablet (40 mg total) by mouth daily. 90 tablet 3   diclofenac (VOLTAREN) 75 MG EC tablet TAKE 1 TABLET BY MOUTH 2 TIMES DAILY AS NEEDED FOR MODERATE PAIN. PLEASE MAKE PCP APPOINTMENT. 60 tablet 0   gabapentin (NEURONTIN) 300 MG capsule Take 1 capsule (300 mg total) by mouth daily. 30 capsule 5   glipiZIDE (GLIPIZIDE XL) 10 MG 24 hr tablet Take 2 tablets (20 mg total) by mouth daily with breakfast. 60 tablet 5   Insulin Glargine (LANTUS SOLOSTAR) 100 UNIT/ML Solostar Pen Inject 30 Units into the skin daily at 10 pm. 5 pen 5   losartan-hydrochlorothiazide (HYZAAR) 50-12.5 MG tablet Take 1 tablet by mouth daily. 30 tablet 5   metFORMIN (GLUCOPHAGE) 1000 MG tablet TAKE 1.5 TABLET ('1500MG'$ ) BY MOUTH IN THE MORNING AND 1 TABLET ('1000MG'$ ) IN THE EVENING. 75 tablet 5   pantoprazole (PROTONIX) 40 MG tablet Take 1 tablet (40 mg total) by mouth daily. 30 tablet 5   Facility-Administered Medications Prior to Visit  Medication Dose Route Frequency Provider Last Rate Last Admin   0.9 %  sodium chloride infusion  500 mL Intravenous Continuous Danis, Estill Cotta III, MD        No Known Allergies  ROS Review of Systems  Constitutional: Negative.   HENT: Negative.   Eyes: Negative.   Respiratory: Negative.     Cardiovascular: Negative.   Gastrointestinal: Negative.   Endocrine: Negative.   Genitourinary: Negative.   Musculoskeletal: Negative.   Skin: Negative.   Allergic/Immunologic: Negative.   Neurological: Negative.   Hematological: Negative.   Psychiatric/Behavioral: Negative.   All other systems reviewed and are negative.     Objective:    Physical Exam  Constitutional: He is oriented to person, place, and time. He appears  well-developed and well-nourished. No distress.  Cardiovascular: Normal rate and regular rhythm.  Pulmonary/Chest: Effort normal. No respiratory distress.  Neurological: He is alert and oriented to person, place, and time.  Skin: Skin is warm and dry. No rash noted. He is not diaphoretic. No erythema. No pallor.  Psychiatric: He has a normal mood and affect. His behavior is normal. Judgment and thought content normal.  Nursing note and vitals reviewed.   BP (!) 147/80    Pulse 89    Temp (!) 97.2 F (36.2 C) (Temporal)    Ht '5\' 4"'$  (1.626 m)    Wt 151 lb 8 oz (68.7 kg)    SpO2 99%    BMI 26.00 kg/m  Wt Readings from Last 3 Encounters:  07/18/19 151 lb 8 oz (68.7 kg)  01/07/19 179 lb (81.2 kg)  09/24/18 144 lb (65.3 kg)     Health Maintenance Due  Topic Date Due   INFLUENZA VACCINE  02/08/2019   HEMOGLOBIN A1C  07/09/2019    There are no preventive care reminders to display for this patient.  Lab Results  Component Value Date   TSH 2.254 05/05/2013   Lab Results  Component Value Date   WBC 7.6 05/05/2013   HGB 13.5 05/05/2013   HCT 37.8 (L) 05/05/2013   MCV 78.4 05/05/2013   PLT 364 05/05/2013   Lab Results  Component Value Date   NA 137 01/07/2019   K 4.4 01/07/2019   CO2 22 01/07/2019   GLUCOSE 231 (H) 01/07/2019   BUN 15 01/07/2019   CREATININE 0.80 01/07/2019   BILITOT 0.2 01/07/2019   ALKPHOS 103 01/07/2019   AST 19 01/07/2019   ALT 22 01/07/2019   PROT 7.0 01/07/2019   ALBUMIN 4.6 01/07/2019   CALCIUM 9.6 01/07/2019    Lab Results  Component Value Date   CHOL 171 10/10/2017   Lab Results  Component Value Date   HDL 49 10/10/2017   Lab Results  Component Value Date   LDLCALC 108 (H) 10/10/2017   Lab Results  Component Value Date   TRIG 72 10/10/2017   Lab Results  Component Value Date   CHOLHDL 3.5 10/10/2017   Lab Results  Component Value Date   HGBA1C 9.8 (A) 01/07/2019      Assessment & Plan:   Problem List Items Addressed This Visit      Cardiovascular and Mediastinum   Essential hypertension   Relevant Medications   amLODipine (NORVASC) 10 MG tablet   atorvastatin (LIPITOR) 40 MG tablet   losartan-hydrochlorothiazide (HYZAAR) 50-12.5 MG tablet     Endocrine   Type 2 diabetes mellitus (HCC) - Primary   Relevant Medications   atorvastatin (LIPITOR) 40 MG tablet   gabapentin (NEURONTIN) 300 MG capsule   glipiZIDE (GLIPIZIDE XL) 10 MG 24 hr tablet   Insulin Glargine (LANTUS SOLOSTAR) 100 UNIT/ML Solostar Pen   losartan-hydrochlorothiazide (HYZAAR) 50-12.5 MG tablet   metFORMIN (GLUCOPHAGE) 1000 MG tablet   Other Relevant Orders   POCT glycosylated hemoglobin (Hb A1C)     Other   Hyperlipidemia   Relevant Medications   amLODipine (NORVASC) 10 MG tablet   atorvastatin (LIPITOR) 40 MG tablet   losartan-hydrochlorothiazide (HYZAAR) 50-12.5 MG tablet    Other Visit Diagnoses    Encounter to establish care       Arthritis pain, hand       Relevant Medications   diclofenac (VOLTAREN) 75 MG EC tablet   Lipid screening       Relevant  Orders   Lipid Panel   Screening for endocrine, metabolic and immunity disorder       Relevant Orders   CMP14+EGFR   CBC with Differential   TSH   Flu vaccine need       Relevant Orders   Flu Vaccine QUAD 6+ mos PF IM (Fluarix Quad PF)   Gastroesophageal reflux disease, unspecified whether esophagitis present       Relevant Medications   pantoprazole (PROTONIX) 40 MG tablet   Rash and nonspecific skin eruption       Relevant  Medications   betamethasone dipropionate 0.05 % cream   ketoconazole (NIZORAL) 2 % cream      Meds ordered this encounter  Medications   amLODipine (NORVASC) 10 MG tablet    Sig: Take 1 tablet (10 mg total) by mouth daily.    Dispense:  30 tablet    Refill:  5    Order Specific Question:   Supervising Provider    Answer:   Delia Chimes A [[id number]]   atorvastatin (LIPITOR) 40 MG tablet    Sig: Take 1 tablet (40 mg total) by mouth daily.    Dispense:  90 tablet    Refill:  3    Discontinue Pravastatin    Order Specific Question:   Supervising Provider    Answer:   Forrest Moron [id number]   diclofenac (VOLTAREN) 75 MG EC tablet    Sig: Take 1 tablet (75 mg total) by mouth 2 (two) times daily.    Dispense:  60 tablet    Refill:  2    Order Specific Question:   Supervising Provider    Answer:   Delia Chimes A [id number]   gabapentin (NEURONTIN) 300 MG capsule    Sig: Take 1 capsule (300 mg total) by mouth daily.    Dispense:  30 capsule    Refill:  5    Order Specific Question:   Supervising Provider    Answer:   Forrest Moron [[id number]]   glipiZIDE (GLIPIZIDE XL) 10 MG 24 hr tablet    Sig: Take 2 tablets (20 mg total) by mouth daily with breakfast.    Dispense:  60 tablet    Refill:  5    Order Specific Question:   Supervising Provider    Answer:   Forrest Moron [[id number]]   Insulin Glargine (LANTUS SOLOSTAR) 100 UNIT/ML Solostar Pen    Sig: Inject 30 Units into the skin daily at 10 pm.    Dispense:  5 pen    Refill:  5    Discontinue previous dose; discontinue victoza    Order Specific Question:   Supervising Provider    Answer:   Forrest Moron [[id number]]   losartan-hydrochlorothiazide (HYZAAR) 50-12.5 MG tablet    Sig: Take 1 tablet by mouth daily.    Dispense:  30 tablet    Refill:  5    Order Specific Question:   Supervising Provider    Answer:   Forrest Moron [id number]   DISCONTD: metFORMIN (GLUCOPHAGE) 1000 MG tablet    Sig: TAKE 1.5  TABLET ('1500MG'$ ) BY MOUTH IN THE MORNING AND 1 TABLET ('1000MG'$ ) IN THE EVENING.    Dispense:  75 tablet    Refill:  5    Order Specific Question:   Supervising Provider    Answer:   Delia Chimes A [[id number]]   pantoprazole (PROTONIX) 40 MG tablet    Sig: Take 1 tablet (40 mg total) by mouth daily.  Dispense:  30 tablet    Refill:  5    Order Specific Question:   Supervising Provider    Answer:   Forrest Moron [[id number]]   metFORMIN (GLUCOPHAGE) 1000 MG tablet    Sig: Take 1 tablet (1,000 mg total) by mouth 2 (two) times daily with a meal. TAKE 1.5 TABLET ('1500MG'$ ) BY MOUTH IN THE MORNING AND 1 TABLET ('1000MG'$ ) IN THE EVENING.    Dispense:  180 tablet    Refill:  0    Order Specific Question:   Supervising Provider    Answer:   Delia Chimes A [id number]   betamethasone dipropionate 0.05 % cream    Sig: Apply topically 2 (two) times daily.    Dispense:  30 g    Refill:  0    Order Specific Question:   Supervising Provider    Answer:   Delia Chimes A [[id number]]   ketoconazole (NIZORAL) 2 % cream    Sig: Apply 1 application topically daily.    Dispense:  15 g    Refill:  0    Order Specific Question:   Supervising Provider    Answer:   Forrest Moron [id number]    Follow-up: No follow-ups on file.   PLAN  A1c today 7.6 - a vast improvement from previous studies.  Will resume medications as previously prescribed. Will follow up closely in 3 months. It appears as his biggest barrier to control of his T2DM is a poor diet - we have discussed this and provided him with reading materials to help educate him. May consider referral to endo in the future  Otherwise, conditions appear somewhat under control. Labs drawn, will follow up as warranted.  Continue to follow up with ophthalmology  Patient encouraged to call clinic with any questions, comments, or concerns.  Maximiano Coss, NP

## 2019-07-19 LAB — CBC WITH DIFFERENTIAL/PLATELET
Basophils Absolute: 0.1 10*3/uL (ref 0.0–0.2)
Basos: 1 %
EOS (ABSOLUTE): 0.4 10*3/uL (ref 0.0–0.4)
Eos: 6 %
Hematocrit: 37.6 % (ref 37.5–51.0)
Hemoglobin: 12.6 g/dL — ABNORMAL LOW (ref 13.0–17.7)
Immature Grans (Abs): 0 10*3/uL (ref 0.0–0.1)
Immature Granulocytes: 0 %
Lymphocytes Absolute: 2 10*3/uL (ref 0.7–3.1)
Lymphs: 34 %
MCH: 26.6 pg (ref 26.6–33.0)
MCHC: 33.5 g/dL (ref 31.5–35.7)
MCV: 80 fL (ref 79–97)
Monocytes Absolute: 0.6 10*3/uL (ref 0.1–0.9)
Monocytes: 9 %
Neutrophils Absolute: 3 10*3/uL (ref 1.4–7.0)
Neutrophils: 50 %
Platelets: 368 10*3/uL (ref 150–450)
RBC: 4.73 x10E6/uL (ref 4.14–5.80)
RDW: 17 % — ABNORMAL HIGH (ref 11.6–15.4)
WBC: 6.1 10*3/uL (ref 3.4–10.8)

## 2019-07-19 LAB — CMP14+EGFR
ALT: 38 IU/L (ref 0–44)
AST: 33 IU/L (ref 0–40)
Albumin/Globulin Ratio: 1.9 (ref 1.2–2.2)
Albumin: 4.5 g/dL (ref 3.8–4.8)
Alkaline Phosphatase: 88 IU/L (ref 39–117)
BUN/Creatinine Ratio: 16 (ref 10–24)
BUN: 16 mg/dL (ref 8–27)
Bilirubin Total: 0.4 mg/dL (ref 0.0–1.2)
CO2: 23 mmol/L (ref 20–29)
Calcium: 9.8 mg/dL (ref 8.6–10.2)
Chloride: 102 mmol/L (ref 96–106)
Creatinine, Ser: 0.97 mg/dL (ref 0.76–1.27)
GFR calc Af Amer: 91 mL/min/{1.73_m2} (ref >59)
GFR calc non Af Amer: 79 mL/min/{1.73_m2} (ref >59)
Globulin, Total: 2.4 g/dL (ref 1.5–4.5)
Glucose: 83 mg/dL (ref 65–99)
Potassium: 4.3 mmol/L (ref 3.5–5.2)
Sodium: 137 mmol/L (ref 134–144)
Total Protein: 6.9 g/dL (ref 6.0–8.5)

## 2019-07-19 LAB — LIPID PANEL
Chol/HDL Ratio: 3.2 ratio (ref 0.0–5.0)
Cholesterol, Total: 152 mg/dL (ref 100–199)
HDL: 47 mg/dL (ref >39)
LDL Chol Calc (NIH): 95 mg/dL (ref 0–99)
Triglycerides: 45 mg/dL (ref 0–149)
VLDL Cholesterol Cal: 10 mg/dL (ref 5–40)

## 2019-07-19 LAB — TSH: TSH: 1.5 u[IU]/mL (ref 0.450–4.500)

## 2019-07-21 NOTE — Progress Notes (Signed)
Good morning,  If someone could give Mr. Signor a call to let him know his lab results are fine, that would be great. No concerns at this time, we'll see him in 3 months.  Thank you  Kathrin Ruddy, NP

## 2019-07-29 ENCOUNTER — Other Ambulatory Visit: Payer: Self-pay | Admitting: Family Medicine

## 2019-07-29 ENCOUNTER — Encounter: Payer: Self-pay | Admitting: Radiology

## 2019-07-29 DIAGNOSIS — I10 Essential (primary) hypertension: Secondary | ICD-10-CM

## 2019-08-05 ENCOUNTER — Other Ambulatory Visit: Payer: Self-pay | Admitting: Family Medicine

## 2019-08-05 ENCOUNTER — Encounter: Payer: Self-pay | Admitting: *Deleted

## 2019-08-05 ENCOUNTER — Ambulatory Visit: Payer: Medicare Other | Attending: Family Medicine | Admitting: Family Medicine

## 2019-08-05 ENCOUNTER — Other Ambulatory Visit: Payer: Self-pay | Admitting: *Deleted

## 2019-08-05 ENCOUNTER — Encounter: Payer: Self-pay | Admitting: Family Medicine

## 2019-08-05 ENCOUNTER — Other Ambulatory Visit: Payer: Self-pay

## 2019-08-05 VITALS — BP 140/68 | HR 97 | Ht 64.0 in | Wt 156.0 lb

## 2019-08-05 DIAGNOSIS — Z794 Long term (current) use of insulin: Secondary | ICD-10-CM

## 2019-08-05 DIAGNOSIS — E1142 Type 2 diabetes mellitus with diabetic polyneuropathy: Secondary | ICD-10-CM | POA: Diagnosis not present

## 2019-08-05 DIAGNOSIS — I10 Essential (primary) hypertension: Secondary | ICD-10-CM

## 2019-08-05 DIAGNOSIS — E1169 Type 2 diabetes mellitus with other specified complication: Secondary | ICD-10-CM | POA: Diagnosis not present

## 2019-08-05 DIAGNOSIS — E782 Mixed hyperlipidemia: Secondary | ICD-10-CM

## 2019-08-05 DIAGNOSIS — M19049 Primary osteoarthritis, unspecified hand: Secondary | ICD-10-CM

## 2019-08-05 LAB — GLUCOSE, POCT (MANUAL RESULT ENTRY): POC Glucose: 165 mg/dl — AB (ref 70–99)

## 2019-08-05 MED ORDER — GLIPIZIDE ER 10 MG PO TB24: 20.0000 mg | ORAL_TABLET | Freq: Every day | ORAL | 1 refills | Status: DC

## 2019-08-05 MED ORDER — LOSARTAN POTASSIUM-HCTZ 50-12.5 MG PO TABS: 1.0000 | ORAL_TABLET | Freq: Every day | ORAL | 1 refills | Status: DC

## 2019-08-05 MED ORDER — ATORVASTATIN CALCIUM 40 MG PO TABS: 40.0000 mg | ORAL_TABLET | Freq: Every day | ORAL | 1 refills | Status: DC

## 2019-08-05 MED ORDER — GABAPENTIN 300 MG PO CAPS: 300.0000 mg | ORAL_CAPSULE | Freq: Every day | ORAL | 1 refills | Status: DC

## 2019-08-05 MED ORDER — LANTUS SOLOSTAR 100 UNIT/ML ~~LOC~~ SOPN: 30.0000 [IU] | PEN_INJECTOR | Freq: Every day | SUBCUTANEOUS | 5 refills | Status: DC

## 2019-08-05 MED ORDER — METFORMIN HCL 1000 MG PO TABS: 1000.0000 mg | ORAL_TABLET | Freq: Two times a day (BID) | ORAL | 1 refills | Status: DC

## 2019-08-05 MED ORDER — METFORMIN HCL 1000 MG PO TABS: ORAL_TABLET | ORAL | 1 refills | Status: DC

## 2019-08-05 MED ORDER — AMLODIPINE BESYLATE 10 MG PO TABS: 10.0000 mg | ORAL_TABLET | Freq: Every day | ORAL | 1 refills | Status: DC

## 2019-08-05 MED ORDER — DICLOFENAC SODIUM 75 MG PO TBEC: 75.0000 mg | DELAYED_RELEASE_TABLET | Freq: Two times a day (BID) | ORAL | 2 refills | Status: DC

## 2019-08-05 MED FILL — metFORMIN HCL 1000 MG TABS: 1000 | 90 days supply | Qty: 225 | Fill #0

## 2019-08-05 MED FILL — glipiZIDE XL 10 MG TB24: 10 | 90 days supply | Qty: 180 | Fill #0

## 2019-08-05 MED FILL — DICLOFENAC SOD EC 75 MG TAB: 75 | 30 days supply | Qty: 60 | Fill #0

## 2019-08-05 MED FILL — AMLODIPINE BESYLATE 10 MG T: 10 | 90 days supply | Qty: 90 | Fill #0

## 2019-08-05 NOTE — Patient Outreach (Signed)
[facility]) Care Management  [place]  08/05/2019   Jerry Serrano 07-30-1950 [id number]   RN Health Coach Monthly Outreach   Referral Date:  01/07/2019 Referral Source:  MD Office Reason for Referral:  "DM, HTN help with insulin cost" Insurance:  [employer] Attempt:  Successful telephone outreach to patient for follow up.  HIPAA verified with patient.  Patient reporting he had been out of medications for about a month due to inability to see provider due to having to work 7 days a week 12 hours a day.  He was able to get off to attend appointment with provider at [facility] and have refill prescriptions sent on 1/8.  Does state he was unable to fill his statin related to price, but has appointment with primary care at [place] today and plans to use their pharmacy to get other medications filled.  Reports he has been monitoring his blood sugars daily.  Fasting blood sugar this morning was 114 with fasting ranges 90-200's.  Higher ranges patient relates to eating late at night when he gets off work.  Does report the occasional hypoglycemic episode.  Reports compliance with his diabetes medications at this time and has been trying to decrease his rice and bread intake.  Last Hgb A1C was 7.6 on 07/18/2019.  Encounter Medications:  Outpatient Encounter Medications as of 08/05/2019  Medication Sig Note  . amLODipine (NORVASC) 10 MG tablet Take 1 tablet (10 mg total) by mouth daily.   Marland Kitchen aspirin EC 81 MG tablet Take 1 tablet (81 mg total) by mouth daily.   . diclofenac (VOLTAREN) 75 MG EC tablet Take 1 tablet (75 mg total) by mouth 2 (two) times daily.   Marland Kitchen gabapentin (NEURONTIN) 300 MG capsule Take 1 capsule (300 mg total) by mouth daily.   Marland Kitchen glipiZIDE (GLIPIZIDE XL) 10 MG 24 hr tablet Take 2 tablets (20 mg total) by mouth daily with breakfast.   . Insulin Glargine (LANTUS SOLOSTAR) 100 UNIT/ML Solostar  Pen Inject 30 Units into the skin daily at 10 pm.   . ketoconazole (NIZORAL) 2 % cream Apply 1 application topically daily.   Marland Kitchen losartan-hydrochlorothiazide (HYZAAR) 50-12.5 MG tablet Take 1 tablet by mouth daily.   . metFORMIN (GLUCOPHAGE) 1000 MG tablet Take 1 tablet (1,000 mg total) by mouth 2 (two) times daily with a meal. TAKE 1.5 TABLET ('1500MG'$ ) BY MOUTH IN THE MORNING AND 1 TABLET ('1000MG'$ ) IN THE EVENING.   Marland Kitchen olopatadine (PATANOL) 0.1 % ophthalmic solution Place 1 drop into both eyes 2 (two) times daily.   . pantoprazole (PROTONIX) 40 MG tablet Take 1 tablet (40 mg total) by mouth daily.   . sildenafil (VIAGRA) 50 MG tablet Take 1 tablet (50 mg total) by mouth daily as needed for erectile dysfunction.   Marland Kitchen atorvastatin (LIPITOR) 40 MG tablet Take 1 tablet (40 mg total) by mouth daily. (Patient not taking: Reported on 08/05/2019) 08/05/2019: Need to get refill  . betamethasone dipropionate 0.05 % cream Apply topically 2 (two) times daily.   . Blood Glucose Monitoring Suppl (ACCU-CHEK AVIVA PLUS) w/Device KIT USE AS DIRECTED 4 TIMES DAILY AFTER MEALS AND AT BEDTIME.   Marland Kitchen glucose blood test strip USE AS DIRECTED THREE TIMES DAILY   . Insulin Pen Needle (BD PEN NEEDLE NANO 2ND GEN) 32G X 4 MM MISC Use as instructed to inject insulin daily.   Elmore Guise Devices (ACCU-CHEK SOFTCLIX) lancets Use as  instructed   . MICROLET LANCETS MISC Test 3 times per day   . pravastatin (PRAVACHOL) 40 MG tablet TAKE 1 TABLET (40 MG TOTAL) BY MOUTH DAILY. (Patient not taking: Reported on 08/05/2019) 08/05/2019: Needs prescription refill  . [DISCONTINUED] amLODipine (NORVASC) 10 MG tablet Take 1 tablet (10 mg total) by mouth daily.   . [DISCONTINUED] atorvastatin (LIPITOR) 40 MG tablet Take 1 tablet (40 mg total) by mouth daily.   . [DISCONTINUED] diclofenac (VOLTAREN) 75 MG EC tablet TAKE 1 TABLET BY MOUTH 2 TIMES DAILY AS NEEDED FOR MODERATE PAIN. Please make PCP appointment.   . [DISCONTINUED] gabapentin (NEURONTIN) 300  MG capsule Take 1 capsule (300 mg total) by mouth daily.   . [DISCONTINUED] glipiZIDE (GLIPIZIDE XL) 10 MG 24 hr tablet Take 2 tablets (20 mg total) by mouth daily with breakfast.   . [DISCONTINUED] Insulin Glargine (LANTUS SOLOSTAR) 100 UNIT/ML Solostar Pen Inject 30 Units into the skin daily at 10 pm.   . [DISCONTINUED] losartan-hydrochlorothiazide (HYZAAR) 50-12.5 MG tablet Take 1 tablet by mouth daily.   . [DISCONTINUED] metFORMIN (GLUCOPHAGE) 1000 MG tablet TAKE 1.5 TABLET ('1500MG'$ ) BY MOUTH IN THE MORNING AND 1 TABLET ('1000MG'$ ) IN THE EVENING.   . [DISCONTINUED] pantoprazole (PROTONIX) 40 MG tablet Take 1 tablet (40 mg total) by mouth daily.   . [DISCONTINUED] pravastatin (PRAVACHOL) 40 MG tablet TAKE 1 TABLET (40 MG TOTAL) BY MOUTH DAILY.    Facility-Administered Encounter Medications as of 08/05/2019  Medication  . 0.9 %  sodium chloride infusion    Functional Status:  In your present state of health, do you have any difficulty performing the following activities: 02/06/2019  Vision? N  Difficulty concentrating or making decisions? N  Walking or climbing stairs? N  Dressing or bathing? N  Doing errands, shopping? N  Some recent data might be hidden    Fall/Depression Screening: Fall Risk  08/05/2019 07/18/2019 07/18/2019  Falls in the past year? 0 0 0  Number falls in past yr: 0 0 0  Injury with Fall? 0 0 0  Risk for fall due to : Medication side effect;Impaired vision - -  Follow up Falls evaluation completed;Education provided;Falls prevention discussed Falls evaluation completed Falls evaluation completed   PHQ 2/9 Scores 07/18/2019 07/18/2019 02/06/2019 01/07/2019 01/07/2019 09/24/2018 06/04/2018  PHQ - 2 Score 0 0 0 0 0 0 0  PHQ- 9 Score - - - - 0 1 0   THN CM Care Plan Problem One     Most Recent Value  Care Plan Problem One  Knowledge deficiet related to self care management of diabetes  Role Documenting the Problem One  [place] for Problem One  Active  THN Long  Term Goal   Patient will maintain A1C of 7 or below within the next 90 days.  THN Long Term Goal Start Date  08/05/19  Interventions for Problem One Long Term Goal  Congratulated patient on reduction of A1C down to 7.6 and now monitoring blood sugars daily, encouraged to continue to monitor blood sugars daily, Care plan and goals reviewed and discussed with patient, reviewed medications and encouraged medication compliance, encouraged patient to obtain statin medication and discussed importance of statin medication, encouraged patient to attend scheduled medical appointments, sending patient EMMI on Diabetic Diet, sending Living Well with Diabetes Educational Packet     Appointments:  Attended appointment with [facility] at [facility] on 07/18/2019.  Patient stating he wants to continue with his primary care provider, Dr.  Newlin with the [facility] and has an appointment today at 1430.  States he only went to [place] provider to get prescription refills and was not able to get off work to schedule appointment with his regular primary care provider.  Plan: RN Health Coach will send patient [facility] Packet. RN Health Coach will send patient Living Well with Diabetes Educational Packet. RN Health Coach will send patient Emergency planning/management officer. RN Health Coach will send patient EMMI Diabetes and Diet. RN Health Coach will send patient EMMI Diabetic Meal Planning. RN Health Coach will send primary care quarterly update. RN Health Coach will make next telephone outreach to patient within the month of April and patient agrees to future outreach.  [place] [phone] [email or web address]

## 2019-08-05 NOTE — Progress Notes (Signed)
Subjective:  Patient ID: Jerry Serrano, male    DOB: May 03, 1951  Age: 69 y.o. MRN: [id number]  CC: Diabetes   HPI Jerry Serrano is a 69 year old male with history of hypertension, hyperlipidemia, type 2 diabetes mellitus (A1c 7.6) who presents today for follow-up visit. He endorses compliance with his medications and denies hypoglycemic episodes.  He has cut back on his starches and carbs.  Neuropathy is controlled on gabapentin. He would need a refill of diclofenac which he uses intermittently for arthritis in his hands. Doing well on his antihypertensive and statin with no complaints of adverse effects. He was seen at [facility] 2 weeks ago as he ran out of his medications and needed refills Uses Protonix prn for acid reflux symptoms. He has no additional concerns today.  Past Medical History:  Diagnosis Date  . Allergy   . Arthritis   . Diabetes mellitus   . Hyperlipidemia   . Hypertension     Past Surgical History:  Procedure Laterality Date  . HERNIA REPAIR      Family History  Problem Relation Age of Onset  . Colon cancer Neg Hx   . Esophageal cancer Neg Hx   . Rectal cancer Neg Hx   . Stomach cancer Neg Hx     No Known Allergies  Outpatient Medications Prior to Visit  Medication Sig Dispense Refill  . amLODipine (NORVASC) 10 MG tablet Take 1 tablet (10 mg total) by mouth daily. 30 tablet 5  . aspirin EC 81 MG tablet Take 1 tablet (81 mg total) by mouth daily. 30 tablet 3  . betamethasone dipropionate 0.05 % cream Apply topically 2 (two) times daily. 30 g 0  . Blood Glucose Monitoring Suppl (ACCU-CHEK AVIVA PLUS) w/Device KIT USE AS DIRECTED 4 TIMES DAILY AFTER MEALS AND AT BEDTIME. 1 kit 0  . diclofenac (VOLTAREN) 75 MG EC tablet Take 1 tablet (75 mg total) by mouth 2 (two) times daily. 60 tablet 2  . gabapentin (NEURONTIN) 300 MG capsule Take 1 capsule (300 mg total) by mouth daily. 30 capsule 5  . glipiZIDE (GLIPIZIDE XL) 10 MG 24 hr tablet Take 2 tablets  (20 mg total) by mouth daily with breakfast. 60 tablet 5  . glucose blood test strip USE AS DIRECTED THREE TIMES DAILY 100 each 12  . Insulin Glargine (LANTUS SOLOSTAR) 100 UNIT/ML Solostar Pen Inject 30 Units into the skin daily at 10 pm. 5 pen 5  . Insulin Pen Needle (BD PEN NEEDLE NANO 2ND GEN) 32G X 4 MM MISC Use as instructed to inject insulin daily. 100 each 2  . ketoconazole (NIZORAL) 2 % cream Apply 1 application topically daily. 15 g 0  . Lancet Devices (ACCU-CHEK SOFTCLIX) lancets Use as instructed 1 each 12  . losartan-hydrochlorothiazide (HYZAAR) 50-12.5 MG tablet Take 1 tablet by mouth daily. 30 tablet 5  . metFORMIN (GLUCOPHAGE) 1000 MG tablet Take 1 tablet (1,000 mg total) by mouth 2 (two) times daily with a meal. TAKE 1.5 TABLET ('1500MG'$ ) BY MOUTH IN THE MORNING AND 1 TABLET ('1000MG'$ ) IN THE EVENING. 180 tablet 0  . MICROLET LANCETS MISC Test 3 times per day 100 each 12  . olopatadine (PATANOL) 0.1 % ophthalmic solution Place 1 drop into both eyes 2 (two) times daily.    . pantoprazole (PROTONIX) 40 MG tablet Take 1 tablet (40 mg total) by mouth daily. 30 tablet 5  . sildenafil (VIAGRA) 50 MG tablet Take 1 tablet (50 mg total) by mouth daily as  needed for erectile dysfunction. 10 tablet 1  . atorvastatin (LIPITOR) 40 MG tablet Take 1 tablet (40 mg total) by mouth daily. (Patient not taking: Reported on 08/05/2019) 90 tablet 3  . pravastatin (PRAVACHOL) 40 MG tablet TAKE 1 TABLET (40 MG TOTAL) BY MOUTH DAILY. (Patient not taking: Reported on 08/05/2019) 30 tablet 0   Facility-Administered Medications Prior to Visit  Medication Dose Route Frequency Provider Last Rate Last Admin  . 0.9 %  sodium chloride infusion  500 mL Intravenous Continuous Danis, Estill Cotta III, MD         ROS Review of Systems  Constitutional: Negative for activity change and appetite change.  HENT: Negative for sinus pressure and sore throat.   Eyes: Negative for visual disturbance.  Respiratory: Negative for  cough, chest tightness and shortness of breath.   Cardiovascular: Negative for chest pain and leg swelling.  Gastrointestinal: Negative for abdominal distention, abdominal pain, constipation and diarrhea.  Endocrine: Negative.   Genitourinary: Negative for dysuria.  Musculoskeletal: Negative for joint swelling and myalgias.  Skin: Negative for rash.  Allergic/Immunologic: Negative.   Neurological: Negative for weakness, light-headedness and numbness.  Psychiatric/Behavioral: Negative for dysphoric mood and suicidal ideas.    Objective:  BP 140/68   Pulse 97   Ht '5\' 4"'$  (1.626 m)   Wt 156 lb (70.8 kg)   SpO2 95%   BMI 26.78 kg/m   BP/Weight 08/05/2019 07/18/2019 6/76/7209  Systolic BP 470 962 836  Diastolic BP 68 80 75  Wt. (Lbs) 156 151.5 179  BMI 26.78 26 30.73      Physical Exam Constitutional:      Appearance: He is well-developed.  Neck:     Vascular: No JVD.  Cardiovascular:     Rate and Rhythm: Normal rate.     Heart sounds: Normal heart sounds. No murmur.  Pulmonary:     Effort: Pulmonary effort is normal.     Breath sounds: Normal breath sounds. No wheezing or rales.  Chest:     Chest wall: No tenderness.  Abdominal:     General: Bowel sounds are normal. There is no distension.     Palpations: Abdomen is soft. There is no mass.     Tenderness: There is no abdominal tenderness.  Musculoskeletal:        General: Normal range of motion.     Right lower leg: No edema.     Left lower leg: No edema.  Neurological:     Mental Status: He is alert and oriented to person, place, and time.  Psychiatric:        Mood and Affect: Mood normal.     CMP Latest Ref Rng & Units 07/18/2019 01/07/2019 06/04/2018  Glucose 65 - 99 mg/dL 83 231(H) 100(H)  BUN 8 - 27 mg/dL '16 15 16  '$ Creatinine 0.76 - 1.27 mg/dL 0.97 0.80 0.80  Sodium 134 - 144 mmol/L 137 137 140  Potassium 3.5 - 5.2 mmol/L 4.3 4.4 4.3  Chloride 96 - 106 mmol/L 102 100 103  CO2 20 - 29 mmol/L '23 22 21    '$ Calcium 8.6 - 10.2 mg/dL 9.8 9.6 10.1  Total Protein 6.0 - 8.5 g/dL 6.9 7.0 -  Total Bilirubin 0.0 - 1.2 mg/dL 0.4 0.2 -  Alkaline Phos 39 - 117 IU/L 88 103 -  AST 0 - 40 IU/L 33 19 -  ALT 0 - 44 IU/L 38 22 -    Lipid Panel     Component Value Date/Time  CHOL 152 07/18/2019 1153   TRIG 45 07/18/2019 1153   HDL 47 07/18/2019 1153   CHOLHDL 3.2 07/18/2019 1153   CHOLHDL 2.8 02/29/2016 1018   VLDL 12 02/29/2016 1018   LDLCALC 95 07/18/2019 1153    CBC    Component Value Date/Time   WBC 6.1 07/18/2019 1153   WBC 7.6 05/05/2013 1116   RBC 4.73 07/18/2019 1153   RBC 4.82 05/05/2013 1116   HGB 12.6 (L) 07/18/2019 1153   HCT 37.6 07/18/2019 1153   PLT 368 07/18/2019 1153   MCV 80 07/18/2019 1153   MCH 26.6 07/18/2019 1153   MCH 28.0 05/05/2013 1116   MCHC 33.5 07/18/2019 1153   MCHC 35.7 05/05/2013 1116   RDW 17.0 (H) 07/18/2019 1153   LYMPHSABS 2.0 07/18/2019 1153   MONOABS 0.7 05/05/2013 1116   EOSABS 0.4 07/18/2019 1153   BASOSABS 0.1 07/18/2019 1153    Lab Results  Component Value Date   HGBA1C 7.6 (A) 07/18/2019    Assessment & Plan:   1. Type 2 diabetes mellitus with other specified complication, without long-term current use of insulin (HCC) Controlled with A1c of 7.6 which is an improvement for him Goal is less than 7 Counseled on Diabetic diet, my plate method, 662 minutes of moderate intensity exercise/week Blood sugar logs with fasting goals of 80-120 mg/dl, random of less than 180 and in the event of sugars less than 60 mg/dl or greater than 400 mg/dl encouraged to notify the clinic. Advised on the need for annual eye exams, annual foot exams, Pneumonia vaccine. - POCT glucose (manual entry)  2. Mixed hyperlipidemia Controlled Low-cholesterol diet - atorvastatin (LIPITOR) 40 MG tablet; Take 1 tablet (40 mg total) by mouth daily.  Dispense: 90 tablet; Refill: 1  3. Essential hypertension Slightly above goal No regimen change today Counseled on  blood pressure goal of less than 130/80, low-sodium, DASH diet, medication compliance, 150 minutes of moderate intensity exercise per week. Discussed medication compliance, adverse effects. - amLODipine (NORVASC) 10 MG tablet; Take 1 tablet (10 mg total) by mouth daily.  Dispense: 90 tablet; Refill: 1 - losartan-hydrochlorothiazide (HYZAAR) 50-12.5 MG tablet; Take 1 tablet by mouth daily.  Dispense: 90 tablet; Refill: 1  4. Arthritis pain, hand Stable - diclofenac (VOLTAREN) 75 MG EC tablet; Take 1 tablet (75 mg total) by mouth 2 (two) times daily.  Dispense: 60 tablet; Refill: 2  5. Type 2 diabetes mellitus with diabetic polyneuropathy, with long-term current use of insulin (HCC) Neuropathy is controlled on gabapentin - gabapentin (NEURONTIN) 300 MG capsule; Take 1 capsule (300 mg total) by mouth daily.  Dispense: 90 capsule; Refill: 1 - glipiZIDE (GLIPIZIDE XL) 10 MG 24 hr tablet; Take 2 tablets (20 mg total) by mouth daily with breakfast.  Dispense: 90 tablet; Refill: 1 - Insulin Glargine (LANTUS SOLOSTAR) 100 UNIT/ML Solostar Pen; Inject 30 Units into the skin daily at 10 pm.  Dispense: 5 pen; Refill: 5 - metFORMIN (GLUCOPHAGE) 1000 MG tablet; TAKE 1.5 TABLET ('1500MG'$ ) BY MOUTH IN THE MORNING AND 1 TABLET ('1000MG'$ ) IN THE EVENING.  Dispense: 225 tablet; Refill: 1     Charlott Rakes, MD, FAAFP. [facility] and [place], [place]   08/05/2019, 2:48 PM

## 2019-08-05 NOTE — Patient Instructions (Signed)

## 2019-08-06 ENCOUNTER — Encounter: Payer: Self-pay | Admitting: Family Medicine

## 2019-08-12 MED FILL — PRAVASTATIN NA 40 MG TAB: 40 | 30 days supply | Qty: 30 | Fill #0

## 2019-09-04 ENCOUNTER — Other Ambulatory Visit: Payer: Self-pay | Admitting: Family Medicine

## 2019-09-04 MED FILL — GABAPENTIN 300 MG CAPSULE: 300 | 60 days supply | Qty: 60 | Fill #4

## 2019-09-11 ENCOUNTER — Ambulatory Visit: Payer: Medicare Other

## 2019-09-11 ENCOUNTER — Ambulatory Visit: Payer: Medicare Other | Attending: Internal Medicine

## 2019-09-11 DIAGNOSIS — Z23 Encounter for immunization: Secondary | ICD-10-CM | POA: Insufficient documentation

## 2019-09-11 NOTE — Telephone Encounter (Signed)
error 

## 2019-09-11 NOTE — Progress Notes (Signed)
   Covid-19 Vaccination Clinic  Name:  Jerry Serrano    MRN: [id number] DOB: 03/30/1951  09/11/2019  [name] was observed post Covid-19 immunization for 15 minutes without incident. He was provided with Vaccine Information Sheet and instruction to access the V-Safe system.   [name] was instructed to call 911 with any severe reactions post vaccine: Marland Kitchen Difficulty breathing  . Swelling of face and throat  . A fast heartbeat  . A bad rash all over body  . Dizziness and weakness   Immunizations Administered    Name Date Dose VIS Date Route   Pfizer COVID-19 Vaccine 09/11/2019  9:13 AM 0.3 mL 06/20/2019 Intramuscular   Manufacturer: [place]   Lot: [id number]   [place]: [id number]

## 2019-09-15 MED FILL — LANTUS SOLOSTAR 100 UNITS/M: 100 | 30 days supply | Qty: 9 | Fill #0

## 2019-09-26 ENCOUNTER — Telehealth: Payer: Self-pay | Admitting: Family Medicine

## 2019-09-26 MED FILL — DICLOFENAC SOD EC 75 MG TAB: 75 | 30 days supply | Qty: 60 | Fill #1

## 2019-09-26 NOTE — Telephone Encounter (Signed)
Diclofenac was last filled for a 3 month supply. On 08/05/19. Refill is still too early.

## 2019-09-26 NOTE — Telephone Encounter (Signed)
Patient  Came in requesting medication for Arthritis to be refilled did not have the name or bottle. Patient uses [facility] please call patient.

## 2019-10-07 ENCOUNTER — Ambulatory Visit: Payer: Medicare Other | Attending: Internal Medicine

## 2019-10-07 DIAGNOSIS — Z23 Encounter for immunization: Secondary | ICD-10-CM

## 2019-10-07 NOTE — Progress Notes (Signed)
   Covid-19 Vaccination Clinic  Name:  Jerry Serrano    MRN: [id number] DOB: August 09, 1950  10/07/2019  [name] was observed post Covid-19 immunization for 15 minutes without incident. He was provided with Vaccine Information Sheet and instruction to access the V-Safe system.   [name] was instructed to call 911 with any severe reactions post vaccine: Marland Kitchen Difficulty breathing  . Swelling of face and throat  . A fast heartbeat  . A bad rash all over body  . Dizziness and weakness   Immunizations Administered    Name Date Dose VIS Date Route   Pfizer COVID-19 Vaccine 10/07/2019  1:52 PM 0.3 mL 06/20/2019 Intramuscular   Manufacturer: [employer]   Lot: [id number]   [place]: [id number]

## 2019-10-17 ENCOUNTER — Ambulatory Visit: Payer: Medicare Other | Admitting: Registered Nurse

## 2019-10-20 ENCOUNTER — Other Ambulatory Visit: Payer: Self-pay | Admitting: *Deleted

## 2019-10-20 NOTE — Patient Outreach (Signed)
[place] [facility]) Care Management  10/20/2019  Jerry Serrano [birth year] [id number]   RN Health Coach Quarterly Outreach  Referral Date:01/07/2019 Referral Source:MD Office Reason for Referral:"DM, HTN help with insulin cost" Insurance:[employer] Medicare   Outreach Attempt:  Outreach attempt #1 to patient for follow up. No answer. RN Health Coach left HIPAA compliant voicemail message along with contact information.  Plan:  RN Health Coach will make another outreach attempt within the month of May if no return call back from patient.  [place] [phone] [email or web address]

## 2019-10-23 MED FILL — TRUEPLUS PEN NDL 32GX5/32: 32G X 4 MM | 90 days supply | Qty: 100 | Fill #2

## 2019-10-24 MED FILL — LANTUS SOLOSTAR 100 UNITS/M: 100 | 30 days supply | Qty: 9 | Fill #1

## 2019-10-27 MED FILL — DICLOFENAC SOD EC 75 MG TAB: 75 | 30 days supply | Qty: 60 | Fill #2

## 2019-11-03 ENCOUNTER — Ambulatory Visit: Payer: Medicare Other | Attending: Family Medicine | Admitting: Family Medicine

## 2019-11-03 ENCOUNTER — Other Ambulatory Visit: Payer: Self-pay

## 2019-11-03 ENCOUNTER — Other Ambulatory Visit: Payer: Self-pay | Admitting: Family Medicine

## 2019-11-03 ENCOUNTER — Encounter: Payer: Self-pay | Admitting: Family Medicine

## 2019-11-03 VITALS — BP 147/72 | HR 95 | Ht 64.0 in | Wt 153.0 lb

## 2019-11-03 DIAGNOSIS — E782 Mixed hyperlipidemia: Secondary | ICD-10-CM

## 2019-11-03 DIAGNOSIS — Z794 Long term (current) use of insulin: Secondary | ICD-10-CM

## 2019-11-03 DIAGNOSIS — E1142 Type 2 diabetes mellitus with diabetic polyneuropathy: Secondary | ICD-10-CM | POA: Diagnosis not present

## 2019-11-03 DIAGNOSIS — I10 Essential (primary) hypertension: Secondary | ICD-10-CM | POA: Diagnosis not present

## 2019-11-03 DIAGNOSIS — E11649 Type 2 diabetes mellitus with hypoglycemia without coma: Secondary | ICD-10-CM | POA: Diagnosis not present

## 2019-11-03 LAB — GLUCOSE, POCT (MANUAL RESULT ENTRY): POC Glucose: 127 mg/dl — AB (ref 70–99)

## 2019-11-03 MED ORDER — LANTUS SOLOSTAR 100 UNIT/ML ~~LOC~~ SOPN: 28.0000 [IU] | PEN_INJECTOR | Freq: Every day | SUBCUTANEOUS | 5 refills | Status: DC

## 2019-11-03 MED ORDER — ATORVASTATIN CALCIUM 40 MG PO TABS: 40.0000 mg | ORAL_TABLET | Freq: Every day | ORAL | 1 refills | Status: DC

## 2019-11-03 MED ORDER — GABAPENTIN 300 MG PO CAPS: 300.0000 mg | ORAL_CAPSULE | Freq: Every day | ORAL | 1 refills | Status: DC

## 2019-11-03 MED ORDER — LOSARTAN POTASSIUM-HCTZ 50-12.5 MG PO TABS: 1.0000 | ORAL_TABLET | Freq: Every day | ORAL | 1 refills | Status: DC

## 2019-11-03 MED ORDER — METFORMIN HCL 1000 MG PO TABS: ORAL_TABLET | ORAL | 1 refills | Status: DC

## 2019-11-03 MED ORDER — AMLODIPINE BESYLATE 10 MG PO TABS: 10.0000 mg | ORAL_TABLET | Freq: Every day | ORAL | 1 refills | Status: DC

## 2019-11-03 NOTE — Progress Notes (Signed)
Subjective:  Patient ID: Jerry Serrano, male    DOB: 1951/03/24  Age: 69 y.o. MRN: [id number]  CC: Diabetes   HPI Jerry Serrano  is a 69 year old male with history of hypertension, hyperlipidemia, type 2 diabetes mellitus (A1c7.6 from 07/2019) who presents today for follow-up visit.  He had hypoglycemic episode ranging from 50-80 and he endorses compliance with his Lantus and his other medications.  He has not had any difficulty obtaining his medications. He has also worked on reducing his intake of starches. His diabetic neuropathy is controlled on gabapentin. Doing well on his antihypertensive and his statin with no complaints of adverse effects. He has no additional concerns today.  Past Medical History:  Diagnosis Date  . Allergy   . Arthritis   . Diabetes mellitus   . Hyperlipidemia   . Hypertension     Past Surgical History:  Procedure Laterality Date  . HERNIA REPAIR      Family History  Problem Relation Age of Onset  . Colon cancer Neg Hx   . Esophageal cancer Neg Hx   . Rectal cancer Neg Hx   . Stomach cancer Neg Hx     No Known Allergies  Outpatient Medications Prior to Visit  Medication Sig Dispense Refill  . amLODipine (NORVASC) 10 MG tablet Take 1 tablet (10 mg total) by mouth daily. 90 tablet 1  . aspirin EC 81 MG tablet Take 1 tablet (81 mg total) by mouth daily. 30 tablet 3  . atorvastatin (LIPITOR) 40 MG tablet Take 1 tablet (40 mg total) by mouth daily. 90 tablet 1  . betamethasone dipropionate 0.05 % cream Apply topically 2 (two) times daily. 30 g 0  . Blood Glucose Monitoring Suppl (ACCU-CHEK AVIVA PLUS) w/Device KIT USE AS DIRECTED 4 TIMES DAILY AFTER MEALS AND AT BEDTIME. 1 kit 0  . diclofenac (VOLTAREN) 75 MG EC tablet Take 1 tablet (75 mg total) by mouth 2 (two) times daily. 60 tablet 2  . gabapentin (NEURONTIN) 300 MG capsule Take 1 capsule (300 mg total) by mouth daily. 90 capsule 1  . glipiZIDE (GLIPIZIDE XL) 10 MG 24 hr tablet Take 2  tablets (20 mg total) by mouth daily with breakfast. 90 tablet 1  . glucose blood test strip USE AS DIRECTED THREE TIMES DAILY 100 each 12  . Insulin Glargine (LANTUS SOLOSTAR) 100 UNIT/ML Solostar Pen Inject 30 Units into the skin daily at 10 pm. 5 pen 5  . Insulin Pen Needle (BD PEN NEEDLE NANO 2ND GEN) 32G X 4 MM MISC Use as instructed to inject insulin daily. 100 each 2  . ketoconazole (NIZORAL) 2 % cream Apply 1 application topically daily. 15 g 0  . Lancet Devices (ACCU-CHEK SOFTCLIX) lancets Use as instructed 1 each 12  . losartan-hydrochlorothiazide (HYZAAR) 50-12.5 MG tablet Take 1 tablet by mouth daily. 90 tablet 1  . metFORMIN (GLUCOPHAGE) 1000 MG tablet TAKE 1.5 TABLET ('1500MG'$ ) BY MOUTH IN THE MORNING AND 1 TABLET ('1000MG'$ ) IN THE EVENING. 225 tablet 1  . MICROLET LANCETS MISC Test 3 times per day 100 each 12  . olopatadine (PATANOL) 0.1 % ophthalmic solution Place 1 drop into both eyes 2 (two) times daily.    . pantoprazole (PROTONIX) 40 MG tablet Take 1 tablet (40 mg total) by mouth daily. 30 tablet 5  . sildenafil (VIAGRA) 50 MG tablet Take 1 tablet (50 mg total) by mouth daily as needed for erectile dysfunction. 10 tablet 1   Facility-Administered Medications Prior to Visit  Medication Dose Route Frequency Provider Last Rate Last Admin  . 0.9 %  sodium chloride infusion  500 mL Intravenous Continuous Danis, Estill Cotta III, MD         ROS Review of Systems  Constitutional: Negative for activity change and appetite change.  HENT: Negative for sinus pressure and sore throat.   Eyes: Negative for visual disturbance.  Respiratory: Negative for cough, chest tightness and shortness of breath.   Cardiovascular: Negative for chest pain and leg swelling.  Gastrointestinal: Negative for abdominal distention, abdominal pain, constipation and diarrhea.  Endocrine: Negative.   Genitourinary: Negative for dysuria.  Musculoskeletal: Negative for joint swelling and myalgias.  Skin: Negative  for rash.  Allergic/Immunologic: Negative.   Neurological: Negative for weakness, light-headedness and numbness.  Psychiatric/Behavioral: Negative for dysphoric mood and suicidal ideas.    Objective:  BP (!) 147/72   Pulse 95   Ht '5\' 4"'$  (1.626 m)   Wt 153 lb (69.4 kg)   SpO2 99%   BMI 26.26 kg/m   BP/Weight 11/03/2019 0/71/2197 11/15/8323  Systolic BP 498 264 158  Diastolic BP 72 68 80  Wt. (Lbs) 153 156 151.5  BMI 26.26 26.78 26      Physical Exam Constitutional:      Appearance: He is well-developed.  Neck:     Vascular: No JVD.  Cardiovascular:     Rate and Rhythm: Normal rate.     Heart sounds: Normal heart sounds. No murmur.  Pulmonary:     Effort: Pulmonary effort is normal.     Breath sounds: Normal breath sounds. No wheezing or rales.  Chest:     Chest wall: No tenderness.  Abdominal:     General: Bowel sounds are normal. There is no distension.     Palpations: Abdomen is soft. There is no mass.     Tenderness: There is no abdominal tenderness.  Musculoskeletal:        General: Normal range of motion.     Right lower leg: No edema.     Left lower leg: No edema.  Neurological:     Mental Status: He is alert and oriented to person, place, and time.  Psychiatric:        Mood and Affect: Mood normal.     CMP Latest Ref Rng & Units 07/18/2019 01/07/2019 06/04/2018  Glucose 65 - 99 mg/dL 83 231(H) 100(H)  BUN 8 - 27 mg/dL '16 15 16  '$ Creatinine 0.76 - 1.27 mg/dL 0.97 0.80 0.80  Sodium 134 - 144 mmol/L 137 137 140  Potassium 3.5 - 5.2 mmol/L 4.3 4.4 4.3  Chloride 96 - 106 mmol/L 102 100 103  CO2 20 - 29 mmol/L '23 22 21  '$ Calcium 8.6 - 10.2 mg/dL 9.8 9.6 10.1  Total Protein 6.0 - 8.5 g/dL 6.9 7.0 -  Total Bilirubin 0.0 - 1.2 mg/dL 0.4 0.2 -  Alkaline Phos 39 - 117 IU/L 88 103 -  AST 0 - 40 IU/L 33 19 -  ALT 0 - 44 IU/L 38 22 -    Lipid Panel     Component Value Date/Time   CHOL 152 07/18/2019 1153   TRIG 45 07/18/2019 1153   HDL 47 07/18/2019 1153    CHOLHDL 3.2 07/18/2019 1153   CHOLHDL 2.8 02/29/2016 1018   VLDL 12 02/29/2016 1018   LDLCALC 95 07/18/2019 1153    CBC    Component Value Date/Time   WBC 6.1 07/18/2019 1153   WBC 7.6 05/05/2013 1116   RBC  4.73 07/18/2019 1153   RBC 4.82 05/05/2013 1116   HGB 12.6 (L) 07/18/2019 1153   HCT 37.6 07/18/2019 1153   PLT 368 07/18/2019 1153   MCV 80 07/18/2019 1153   MCH 26.6 07/18/2019 1153   MCH 28.0 05/05/2013 1116   MCHC 33.5 07/18/2019 1153   MCHC 35.7 05/05/2013 1116   RDW 17.0 (H) 07/18/2019 1153   LYMPHSABS 2.0 07/18/2019 1153   MONOABS 0.7 05/05/2013 1116   EOSABS 0.4 07/18/2019 1153   BASOSABS 0.1 07/18/2019 1153    Lab Results  Component Value Date   HGBA1C 7.6 (A) 07/18/2019    Assessment & Plan:   1. Type 2 diabetes mellitus with hypoglycemia without coma, without long-term current use of insulin (HCC) Controlled with A1c  7.6; goal is less than 7 Decrease Lantus from 30 units at bedtime to 28 units due to hypoglycemic episodes If fasting sugars remain below 80, will decrease metformin dose. Counseled on Diabetic diet, my plate method, 607 minutes of moderate intensity exercise/week Blood sugar logs with fasting goals of 80-120 mg/dl, random of less than 180 and in the event of sugars less than 60 mg/dl or greater than 400 mg/dl encouraged to notify the clinic. Advised on the need for annual eye exams, annual foot exams, Pneumonia vaccine. - POCT glucose (manual entry) - Hemoglobin A1c  2. Type 2 diabetes mellitus with diabetic polyneuropathy, with long-term current use of insulin (HCC) Neuropathy is controlled - insulin glargine (LANTUS SOLOSTAR) 100 UNIT/ML Solostar Pen; Inject 28 Units into the skin at bedtime.  Dispense: 5 pen; Refill: 5 - gabapentin (NEURONTIN) 300 MG capsule; Take 1 capsule (300 mg total) by mouth daily.  Dispense: 90 capsule; Refill: 1 - metFORMIN (GLUCOPHAGE) 1000 MG tablet; TAKE 1.5 TABLET ('1500MG'$ ) BY MOUTH IN THE MORNING AND 1  TABLET ('1000MG'$ ) IN THE EVENING.  Dispense: 225 tablet; Refill: 1  3. Essential hypertension Slightly above goal No regimen changes Counseled on blood pressure goal of less than 130/80, low-sodium, DASH diet, medication compliance, 150 minutes of moderate intensity exercise per week. Discussed medication compliance, adverse effects. - Basic Metabolic Panel - amLODipine (NORVASC) 10 MG tablet; Take 1 tablet (10 mg total) by mouth daily.  Dispense: 90 tablet; Refill: 1 - losartan-hydrochlorothiazide (HYZAAR) 50-12.5 MG tablet; Take 1 tablet by mouth daily.  Dispense: 90 tablet; Refill: 1  4. Mixed hyperlipidemia Controlled Low cholesterol diet - atorvastatin (LIPITOR) 40 MG tablet; Take 1 tablet (40 mg total) by mouth daily.  Dispense: 90 tablet; Refill: 1     Charlott Rakes, MD, FAAFP. [facility] and [place], [place]   11/03/2019, 11:31 AM

## 2019-11-04 LAB — BASIC METABOLIC PANEL
BUN/Creatinine Ratio: 19 (ref 10–24)
BUN: 19 mg/dL (ref 8–27)
CO2: 21 mmol/L (ref 20–29)
Calcium: 9.6 mg/dL (ref 8.6–10.2)
Chloride: 104 mmol/L (ref 96–106)
Creatinine, Ser: 0.99 mg/dL (ref 0.76–1.27)
GFR calc Af Amer: 89 mL/min/{1.73_m2} (ref >59)
GFR calc non Af Amer: 77 mL/min/{1.73_m2} (ref >59)
Glucose: 151 mg/dL — ABNORMAL HIGH (ref 65–99)
Potassium: 4.1 mmol/L (ref 3.5–5.2)
Sodium: 140 mmol/L (ref 134–144)

## 2019-11-04 LAB — HEMOGLOBIN A1C
Est. average glucose Bld gHb Est-mCnc: 171 mg/dL
Hgb A1c MFr Bld: 7.6 % — ABNORMAL HIGH (ref 4.8–5.6)

## 2019-11-07 ENCOUNTER — Telehealth: Payer: Self-pay

## 2019-11-07 NOTE — Telephone Encounter (Signed)
-----   Message from Charlott Rakes, MD sent at 11/04/2019  1:15 PM EDT ----- A1c is 7.6 which is good.  Advise him to continue his current regimen.  Other labs are stable

## 2019-11-07 NOTE — Telephone Encounter (Signed)
Patient was called and a voicemail was left informing patient to return phone call for lab results. 

## 2019-11-13 ENCOUNTER — Telehealth: Payer: Self-pay

## 2019-11-13 NOTE — Telephone Encounter (Signed)
Letter has been mailed out to patient regarding his lab results.

## 2019-11-13 NOTE — Telephone Encounter (Signed)
-----   Message from Charlott Rakes, MD sent at 11/04/2019  1:15 PM EDT ----- A1c is 7.6 which is good.  Advise him to continue his current regimen.  Other labs are stable

## 2019-11-20 ENCOUNTER — Other Ambulatory Visit: Payer: Self-pay | Admitting: *Deleted

## 2019-11-20 NOTE — Patient Outreach (Signed)
[place] [facility]) Care Management  11/20/2019  Jerry Serrano 03-Dec-1950 [id number]   RN Health Coach Quarterly Outreach  Referral Date:01/07/2019 Referral Source:MD Office Reason for Referral:"DM, HTN help with insulin cost" Insurance:[employer] Medicare   Outreach Attempt:  Outreach attempt #2 to patient for follow up. No answer. RN Health Coach left HIPAA compliant voicemail message along with contact information.  Plan:  RN Health Coach will make another outreach attempt within the month of June if no return call back from patient.  [place] [phone] [email or web address]

## 2019-11-24 MED FILL — LANTUS SOLOSTAR 100 UNITS/M: 100 | 30 days supply | Qty: 9 | Fill #2

## 2019-11-24 MED FILL — DICLOFENAC SOD EC 75 MG TAB: 75 | 30 days supply | Qty: 60 | Fill #0

## 2019-12-25 ENCOUNTER — Other Ambulatory Visit: Payer: Self-pay | Admitting: *Deleted

## 2019-12-25 NOTE — Patient Outreach (Signed)
[place] [facility]) Care Management  12/25/2019  Lang Zingg Bisson 04-28-1951 [id number]   [place]  Referral Date:01/07/2019 Referral Source:MD Office Reason for Referral:"DM, HTN help with insulin cost" Insurance:[employer] Medicare   Outreach Attempt:  Outreach attempt #3 to patient for follow up.  Patient answered  And stated he was driving and at work; requested call back another day and time.   Plan:  RN Health Coach will make another outreach attempt within the month of July per patient request.    Hubert Azure RN [place] [phone] [email or web address]

## 2019-12-29 ENCOUNTER — Other Ambulatory Visit: Payer: Self-pay | Admitting: Family Medicine

## 2019-12-29 DIAGNOSIS — M19049 Primary osteoarthritis, unspecified hand: Secondary | ICD-10-CM

## 2019-12-29 MED FILL — DICLOFENAC SOD EC 75 MG TAB: 75 | 30 days supply | Qty: 60 | Fill #0

## 2019-12-29 MED FILL — TRUEPLUS PEN NDL 32GX5/32: 32G X 4 MM | 25 days supply | Qty: 100 | Fill #0

## 2019-12-30 MED FILL — LANTUS SOLOSTAR 100 UNITS/M: 100 | 30 days supply | Qty: 9 | Fill #3

## 2020-01-16 ENCOUNTER — Other Ambulatory Visit: Payer: Self-pay | Admitting: Registered Nurse

## 2020-01-16 DIAGNOSIS — Z794 Long term (current) use of insulin: Secondary | ICD-10-CM

## 2020-01-16 DIAGNOSIS — E1142 Type 2 diabetes mellitus with diabetic polyneuropathy: Secondary | ICD-10-CM

## 2020-01-16 MED FILL — metFORMIN HCL 1000 MG TABS: 1000 | 90 days supply | Qty: 225 | Fill #0

## 2020-01-27 MED FILL — DICLOFENAC SOD EC 75 MG TAB: 75 | 30 days supply | Qty: 60 | Fill #1

## 2020-01-28 ENCOUNTER — Other Ambulatory Visit: Payer: Self-pay | Admitting: *Deleted

## 2020-01-28 NOTE — Patient Outreach (Signed)
[place] [facility]) Care Management  01/28/2020  [name] Apr 28, 1951 [id number]   [place]  Referral Date:01/07/2019 Referral Source:MD Office Reason for Referral:"DM, HTN help with insulin cost" Insurance:[employer] Medicare    Outreach Attempt:  Outreach attempt #4 to patient for follow up. No answer. RN Health Coach left HIPAA compliant voicemail message along with contact information.  Plan:  RN Health Coach will make another outreach attempt within the month of August if no return call back from patient.   Jerry Serrano [phone] [email or web address]

## 2020-02-03 ENCOUNTER — Other Ambulatory Visit: Payer: Self-pay | Admitting: Registered Nurse

## 2020-02-03 DIAGNOSIS — K219 Gastro-esophageal reflux disease without esophagitis: Secondary | ICD-10-CM

## 2020-02-03 DIAGNOSIS — H25813 Combined forms of age-related cataract, bilateral: Secondary | ICD-10-CM | POA: Diagnosis not present

## 2020-02-03 DIAGNOSIS — H40023 Open angle with borderline findings, high risk, bilateral: Secondary | ICD-10-CM | POA: Diagnosis not present

## 2020-02-03 MED FILL — LANTUS SOLOSTAR 100 UNITS/M: 100 | 30 days supply | Qty: 9 | Fill #4

## 2020-02-03 NOTE — Telephone Encounter (Signed)
Please schedule an f/u appt for med refills

## 2020-02-03 NOTE — Telephone Encounter (Signed)
Called pt LVMTCB on 02/03/20 on 2:34 pm

## 2020-02-25 ENCOUNTER — Other Ambulatory Visit: Payer: Self-pay | Admitting: *Deleted

## 2020-02-25 NOTE — Patient Outreach (Signed)
[place] [facility]) Care Management  02/25/2020  Schyler Counsell Hazel 01-31-1951 [id number]   [place]  Referral Date:01/07/2019 Referral Source:MD Office Reason for Referral:"DM, HTN help with insulin cost" Insurance:[employer] Medicare   Outreach Attempt: Outreach attempt #5 to patient for follow up. No answer. RN Health Coach left HIPAA compliant voicemail message along with contact information.  Plan:  RN Health Coach will make another outreach attempt within the month of September if no return call back from patient.   [place] [phone] [email or web address]

## 2020-03-03 MED FILL — DICLOFENAC SOD EC 75 MG TAB: 75 | 30 days supply | Qty: 60 | Fill #2

## 2020-03-16 MED FILL — LANTUS SOLOSTAR 100 UNITS/M: 100 | 30 days supply | Qty: 9 | Fill #5

## 2020-03-25 ENCOUNTER — Other Ambulatory Visit: Payer: Self-pay | Admitting: Family Medicine

## 2020-03-25 DIAGNOSIS — M19049 Primary osteoarthritis, unspecified hand: Secondary | ICD-10-CM

## 2020-03-25 MED ORDER — DICLOFENAC SODIUM 75 MG PO TBEC: 75.0000 mg | DELAYED_RELEASE_TABLET | Freq: Two times a day (BID) | ORAL | 2 refills | Status: DC

## 2020-03-30 ENCOUNTER — Other Ambulatory Visit: Payer: Self-pay | Admitting: *Deleted

## 2020-03-30 NOTE — Patient Outreach (Signed)
[facility]) Care Management  03/30/2020  [place] October 11, 1950 [id number]   [place]  Referral Date:01/07/2019 Referral Source:MD Office Reason for Referral:"DM, HTN help with insulin cost" Insurance:[employer] Medicare   Outreach Attempt:  Outreach attempt #6 to patient for follow up. No answer. RN Health Coach left HIPAA compliant voicemail message along with contact information.  Plan:  RN Health Coach will make another outreach attempt within the month of October if no return call back from patient.   [place] [phone] [email or web address]

## 2020-04-01 MED FILL — DICLOFENAC SOD EC 75 MG TAB: 75 | 30 days supply | Qty: 60 | Fill #0

## 2020-04-01 NOTE — Progress Notes (Signed)
[facility] Note  04/05/2020     CHIEF COMPLAINT Patient presents for Retina Follow Up   HISTORY OF PRESENT ILLNESS: Jerry Serrano is a 69 y.o. male who presents to the clinic today for:   HPI    Retina Follow Up    Patient presents with  Diabetic Retinopathy.  In both eyes.  This started years ago.  Severity is moderate.  Duration of years.  Since onset it is stable.  I, the attending physician,  performed the HPI with the patient and updated documentation appropriately.          Comments    Pt states vision is OK OU.  Patient denies eye pain or discomfort and denies any new or worsening floaters or fol OU.       Last edited by Bernarda Caffey, MD on 04/05/2020 10:20 AM. (History)    pts A1c was 7.6 on 04.26.21  Referring physician: Warden Fillers, MD [address] [place],  [place] [address]  HISTORICAL INFORMATION:   Selected notes from the MEDICAL RECORD NUMBER Referred by [facility] for DM exam LEE:  Ocular Hx- PMH-DM (A1c: 8.3 [11.26.19], takes metformin, glipizide, lantus), HTN, HLD, arthritis   CURRENT MEDICATIONS: Current Outpatient Medications (Ophthalmic Drugs)  Medication Sig  . olopatadine (PATANOL) 0.1 % ophthalmic solution Place 1 drop into both eyes 2 (two) times daily.   No current facility-administered medications for this visit. (Ophthalmic Drugs)   Current Outpatient Medications (Other)  Medication Sig  . TRUEPLUS PEN NEEDLES 32G X 4 MM MISC USE AS INSTRUCTED TO INJECT INSULIN DAILY.  Marland Kitchen amLODipine (NORVASC) 10 MG tablet Take 1 tablet (10 mg total) by mouth daily.  Marland Kitchen aspirin EC 81 MG tablet Take 1 tablet (81 mg total) by mouth daily.  Marland Kitchen atorvastatin (LIPITOR) 40 MG tablet Take 1 tablet (40 mg total) by mouth daily.  . betamethasone dipropionate 0.05 % cream Apply topically 2 (two) times daily.  . Blood Glucose Monitoring Suppl (ACCU-CHEK AVIVA PLUS) w/Device KIT USE AS DIRECTED 4 TIMES  DAILY AFTER MEALS AND AT BEDTIME.  Marland Kitchen diclofenac (VOLTAREN) 75 MG EC tablet Take 1 tablet (75 mg total) by mouth 2 (two) times daily.  Marland Kitchen gabapentin (NEURONTIN) 300 MG capsule Take 1 capsule (300 mg total) by mouth daily.  Marland Kitchen glipiZIDE (GLIPIZIDE XL) 10 MG 24 hr tablet Take 2 tablets (20 mg total) by mouth daily with breakfast.  . glucose blood test strip USE AS DIRECTED THREE TIMES DAILY  . insulin glargine (LANTUS SOLOSTAR) 100 UNIT/ML Solostar Pen Inject 28 Units into the skin at bedtime.  Marland Kitchen ketoconazole (NIZORAL) 2 % cream Apply 1 application topically daily.  Elmore Guise Devices (ACCU-CHEK SOFTCLIX) lancets Use as instructed  . losartan-hydrochlorothiazide (HYZAAR) 50-12.5 MG tablet Take 1 tablet by mouth daily.  . metFORMIN (GLUCOPHAGE) 1000 MG tablet TAKE 1.5 TABLET (1500MG) BY MOUTH IN THE MORNING AND 1 TABLET (1000MG) IN THE EVENING.  Charlott Holler LANCETS MISC Test 3 times per day  . pantoprazole (PROTONIX) 40 MG tablet TAKE 1  BY MOUTH ONCE DAILY  . sildenafil (VIAGRA) 50 MG tablet Take 1 tablet (50 mg total) by mouth daily as needed for erectile dysfunction.   Current Facility-Administered Medications (Other)  Medication Route  . 0.9 %  sodium chloride infusion Intravenous      REVIEW OF SYSTEMS: ROS    Positive for: Endocrine, Eyes   Last edited by Doneen Poisson on 04/05/2020  9:09 AM. (History)       ALLERGIES No Known Allergies  PAST MEDICAL HISTORY Past Medical History:  Diagnosis Date  . Allergy   . Arthritis   . Diabetes mellitus   . Hyperlipidemia   . Hypertension    Past Surgical History:  Procedure Laterality Date  . HERNIA REPAIR      FAMILY HISTORY Family History  Problem Relation Age of Onset  . Colon cancer Neg Hx   . Esophageal cancer Neg Hx   . Rectal cancer Neg Hx   . Stomach cancer Neg Hx     SOCIAL HISTORY Social History   Tobacco Use  . Smoking status: Former Smoker    Types: Cigarettes  . Smokeless tobacco: Never Used  . Tobacco  comment: over 30 years ago  Vaping Use  . Vaping Use: Never used  Substance Use Topics  . Alcohol use: No  . Drug use: No         OPHTHALMIC EXAM:  Base Eye Exam    Visual Acuity (Snellen - Linear)      Right Left   Dist [place] 20/40 -2 20/40 -2   Dist ph [place] 20/30 -2 NI   Correction: Glasses       Tonometry (Tonopen, 9:15 AM)      Right Left   Pressure 24 24       Pupils      Dark Light Shape React APD   Right 3 2 Round Minimal 0   Left 3 2 Round Minimal 0       Visual Fields      Left Right    Full Full       Extraocular Movement      Right Left    Full Full       Neuro/Psych    Oriented x3: Yes   Mood/Affect: Normal       Dilation    Both eyes: 1.0% Mydriacyl, 2.5% Phenylephrine @ 9:16 AM        Slit Lamp and Fundus Exam    Slit Lamp Exam      Right Left   Lids/Lashes Dermatochalasis - upper lid Dermatochalasis - upper lid   Conjunctiva/Sclera Mild nasal and temporal Pinguecula, Melanosis Mild nasal and temporal Pinguecula, Melanosis   Cornea 1-2+ inferior Punctate epithelial erosions 1-2+ inferior Punctate epithelial erosions, mild Debris in tear film   Anterior Chamber Deep and quiet Deep and quiet   Iris Round and dilated, No NVI Round and dilated, No NVI   Lens 2-3+ Nuclear sclerosis, 3+ Cortical cataract 2-3+ Nuclear sclerosis, 3+ Cortical cataract, trace Posterior subcapsular cataract   Vitreous mild Vitreous syneresis mild Vitreous syneresis       Fundus Exam      Right Left   Disc Sharp rim, +cupping, +central pallor, temporal PPP Sharp rim, +cupping, +central pallor, temporal PPP   C/D Ratio 0.7 0.65   Macula Flat, Good foveal reflex, mild Retinal pigment epithelial mottling, No heme or edema Flat, Good foveal reflex, rare MA, mild Retinal pigment epithelial mottling, No heme or edema   Vessels attenuated, mild tortuousity attenuated, mild tortuousity   Periphery Attached, No heme  Attached, No heme         Refraction    Wearing Rx       Sphere   Right None   Left None          IMAGING AND PROCEDURES  Imaging and Procedures for _0 @  OCT, Retina - OU -  Both Eyes       Right Eye Quality was good. Central Foveal Thickness: 251. Progression has been stable. Findings include no IRF, normal foveal contour, no SRF.   Left Eye Quality was good. Central Foveal Thickness: 254. Progression has no prior data. Findings include normal foveal contour, no IRF, no SRF.   Notes *Images captured and stored on drive  Diagnosis / Impression:  NFP, no IRF/SRF OU No DME OU  Clinical management:  See below  Abbreviations: NFP - Normal foveal profile. CME - cystoid macular edema. PED - pigment epithelial detachment. IRF - intraretinal fluid. SRF - subretinal fluid. EZ - ellipsoid zone. ERM - epiretinal membrane. ORA - outer retinal atrophy. ORT - outer retinal tubulation. SRHM - subretinal hyper-reflective material                 ASSESSMENT/PLAN:    ICD-10-CM   1. Diabetes mellitus type 2 without retinopathy ([place])  E11.9   2. Retinal edema  H35.81 OCT, Retina - OU - Both Eyes  3. Essential hypertension  I10   4. Hypertensive retinopathy of both eyes  H35.033   5. Combined forms of age-related cataract of both eyes  H25.813   6. Glaucoma suspect of both eyes  H40.003     1. Diabetes mellitus, type 2 without retinopathy  - pts A1c was 7.6 on 04.26.21  - tr MA noted OS on exam  - The incidence, risk factors for progression, natural history and treatment options for diabetic retinopathy  were discussed with patient.    - The need for close monitoring of blood glucose, blood pressure, and serum lipids, avoiding cigarette or any type of tobacco, and the need for long term follow up was also discussed with patient.  - continue annual diabetic checks -- okay to resume diabetic eye exams with Dr. Katy Fitch  2. No retinal edema on exam or OCT  3,4. Hypertensive retinopathy OU  - discussed importance of tight BP  control  - monitor  5. Mixed form age related cataract OU  - The symptoms of cataract, surgical options, and treatments and risks were discussed with patient.  - discussed diagnosis and progression  - under the expert management of Dr. Midge Aver  6. Glaucoma Suspect OU  - IOP today: 24 OU  - +cupping   - under the expert management of Dr. Midge Aver   Ophthalmic Meds Ordered this visit:  No orders of the defined types were placed in this encounter.      Return in about 1 year (around 04/05/2021) for f/u DM exam, DFE, OCT.  There are no Patient Instructions on file for this visit.  This document serves as a record of services personally performed by Gardiner Sleeper, MD, PhD. It was created on their behalf by Leeann Must, [place], an ophthalmic technician. The creation of this record is the provider's dictation and/or activities during the visit.    Electronically signed by: Leeann Must, COA 09.23.2021 1:10 PM   This document serves as a record of services personally performed by Gardiner Sleeper, MD, PhD. It was created on their behalf by San Jetty. Owens Shark, OA an ophthalmic technician. The creation of this record is the provider's dictation and/or activities during the visit.    Electronically signed by: San Jetty. Owens Shark, [place] 09.27.2021 1:10 PM  Gardiner Sleeper, M.D., Ph.D. Diseases & Surgery of the Retina and [place] 04/05/2020   I have reviewed the above documentation  for accuracy and completeness, and I agree with the above. Gardiner Sleeper, M.D., Ph.D. 04/05/20 1:10 PM   Abbreviations: M myopia (nearsighted); A astigmatism; H hyperopia (farsighted); P presbyopia; Mrx spectacle prescription;  CTL contact lenses; OD right eye; OS left eye; OU both eyes  XT exotropia; ET esotropia; PEK punctate epithelial keratitis; PEE punctate epithelial erosions; DES dry eye syndrome; MGD meibomian gland dysfunction; ATs artificial tears; PFAT's  preservative free artificial tears; [place] nuclear sclerotic cataract; PSC posterior subcapsular cataract; ERM epi-retinal membrane; PVD posterior vitreous detachment; RD retinal detachment; DM diabetes mellitus; DR diabetic retinopathy; NPDR non-proliferative diabetic retinopathy; PDR proliferative diabetic retinopathy; CSME clinically significant macular edema; DME diabetic macular edema; dbh dot blot hemorrhages; CWS cotton wool spot; POAG primary open angle glaucoma; C/D cup-to-disc ratio; HVF humphrey visual field; GVF goldmann visual field; OCT optical coherence tomography; IOP intraocular pressure; BRVO Branch retinal vein occlusion; CRVO central retinal vein occlusion; CRAO central retinal artery occlusion; BRAO branch retinal artery occlusion; RT retinal tear; SB scleral buckle; PPV pars plana vitrectomy; VH Vitreous hemorrhage; PRP panretinal laser photocoagulation; IVK intravitreal kenalog; VMT vitreomacular traction; MH Macular hole;  NVD neovascularization of the disc; NVE neovascularization elsewhere; AREDS age related eye disease study; ARMD age related macular degeneration; POAG primary open angle glaucoma; EBMD epithelial/anterior basement membrane dystrophy; ACIOL anterior chamber intraocular lens; IOL intraocular lens; PCIOL posterior chamber intraocular lens; Phaco/IOL phacoemulsification with intraocular lens placement; [place] photorefractive keratectomy; LASIK laser assisted in situ keratomileusis; HTN hypertension; DM diabetes mellitus; COPD chronic obstructive pulmonary disease

## 2020-04-05 ENCOUNTER — Encounter (INDEPENDENT_AMBULATORY_CARE_PROVIDER_SITE_OTHER): Payer: Self-pay | Admitting: Ophthalmology

## 2020-04-05 ENCOUNTER — Other Ambulatory Visit: Payer: Self-pay

## 2020-04-05 ENCOUNTER — Ambulatory Visit (INDEPENDENT_AMBULATORY_CARE_PROVIDER_SITE_OTHER): Payer: Medicare Other | Admitting: Ophthalmology

## 2020-04-05 DIAGNOSIS — I10 Essential (primary) hypertension: Secondary | ICD-10-CM | POA: Diagnosis not present

## 2020-04-05 DIAGNOSIS — E119 Type 2 diabetes mellitus without complications: Secondary | ICD-10-CM | POA: Diagnosis not present

## 2020-04-05 DIAGNOSIS — H25813 Combined forms of age-related cataract, bilateral: Secondary | ICD-10-CM | POA: Diagnosis not present

## 2020-04-05 DIAGNOSIS — H35033 Hypertensive retinopathy, bilateral: Secondary | ICD-10-CM | POA: Diagnosis not present

## 2020-04-05 DIAGNOSIS — H40003 Preglaucoma, unspecified, bilateral: Secondary | ICD-10-CM

## 2020-04-05 DIAGNOSIS — H3581 Retinal edema: Secondary | ICD-10-CM | POA: Diagnosis not present

## 2020-04-22 MED FILL — METFORMIN HCL 1000 MG TABS: 1000 | 90 days supply | Qty: 225 | Fill #1

## 2020-04-26 ENCOUNTER — Ambulatory Visit: Payer: Medicare Other | Admitting: Family Medicine

## 2020-04-29 ENCOUNTER — Other Ambulatory Visit: Payer: Self-pay | Admitting: Registered Nurse

## 2020-04-29 DIAGNOSIS — Z794 Long term (current) use of insulin: Secondary | ICD-10-CM

## 2020-04-29 DIAGNOSIS — I10 Essential (primary) hypertension: Secondary | ICD-10-CM

## 2020-04-29 DIAGNOSIS — E1142 Type 2 diabetes mellitus with diabetic polyneuropathy: Secondary | ICD-10-CM

## 2020-04-29 MED FILL — DICLOFENAC SOD EC 75 MG TAB: 75 | 30 days supply | Qty: 60 | Fill #1

## 2020-04-30 ENCOUNTER — Other Ambulatory Visit: Payer: Self-pay | Admitting: Family Medicine

## 2020-04-30 DIAGNOSIS — Z794 Long term (current) use of insulin: Secondary | ICD-10-CM

## 2020-04-30 DIAGNOSIS — E1142 Type 2 diabetes mellitus with diabetic polyneuropathy: Secondary | ICD-10-CM

## 2020-05-03 ENCOUNTER — Other Ambulatory Visit: Payer: Self-pay | Admitting: *Deleted

## 2020-05-03 MED FILL — LANTUS SOLOSTAR 100 UNITS/M: 100 | 30 days supply | Qty: 9 | Fill #6

## 2020-05-03 NOTE — Patient Outreach (Signed)
[facility]) Care Management  [place]  05/03/2020   Jerry Serrano 08/26/1950 [id number]   [name]  Referral Date:01/07/2019 Referral Source:MD Office Reason for Referral:"DM, HTN help with insulin cost" Insurance:[employer] Medicare   Outreach Attempt:  Outreach attempt #7 to patient for follow up. No answer. RN Health Coach left voicemail message along with contact information.  Plan:  RN Health Coach will make another outreach attempt within the month of November if no return call back from patient.   [name] [phone] [name]

## 2020-05-13 ENCOUNTER — Ambulatory Visit: Payer: Medicare Other | Attending: Family Medicine | Admitting: Family Medicine

## 2020-05-13 ENCOUNTER — Other Ambulatory Visit: Payer: Self-pay | Admitting: *Deleted

## 2020-05-13 ENCOUNTER — Encounter: Payer: Self-pay | Admitting: Family Medicine

## 2020-05-13 ENCOUNTER — Other Ambulatory Visit: Payer: Self-pay

## 2020-05-13 ENCOUNTER — Other Ambulatory Visit: Payer: Self-pay | Admitting: Family Medicine

## 2020-05-13 VITALS — BP 156/70 | HR 88 | Ht 64.0 in | Wt 152.0 lb

## 2020-05-13 DIAGNOSIS — E1142 Type 2 diabetes mellitus with diabetic polyneuropathy: Secondary | ICD-10-CM | POA: Diagnosis not present

## 2020-05-13 DIAGNOSIS — Z23 Encounter for immunization: Secondary | ICD-10-CM | POA: Diagnosis not present

## 2020-05-13 DIAGNOSIS — M19049 Primary osteoarthritis, unspecified hand: Secondary | ICD-10-CM

## 2020-05-13 DIAGNOSIS — I10 Essential (primary) hypertension: Secondary | ICD-10-CM

## 2020-05-13 DIAGNOSIS — Z794 Long term (current) use of insulin: Secondary | ICD-10-CM

## 2020-05-13 DIAGNOSIS — E782 Mixed hyperlipidemia: Secondary | ICD-10-CM

## 2020-05-13 DIAGNOSIS — E11649 Type 2 diabetes mellitus with hypoglycemia without coma: Secondary | ICD-10-CM | POA: Diagnosis not present

## 2020-05-13 LAB — POCT GLYCOSYLATED HEMOGLOBIN (HGB A1C): HbA1c, POC (controlled diabetic range): 8.1 % — AB (ref 0.0–7.0)

## 2020-05-13 LAB — GLUCOSE, POCT (MANUAL RESULT ENTRY): POC Glucose: 121 mg/dl — AB (ref 70–99)

## 2020-05-13 MED ORDER — LOSARTAN POTASSIUM-HCTZ 100-25 MG PO TABS: 1.0000 | ORAL_TABLET | Freq: Every day | ORAL | 1 refills | Status: DC

## 2020-05-13 MED ORDER — AMLODIPINE BESYLATE 10 MG PO TABS: 10.0000 mg | ORAL_TABLET | Freq: Every day | ORAL | 1 refills | Status: DC

## 2020-05-13 MED ORDER — GLIPIZIDE ER 10 MG PO TB24: ORAL_TABLET | ORAL | 1 refills | Status: DC

## 2020-05-13 MED ORDER — LANTUS SOLOSTAR 100 UNIT/ML ~~LOC~~ SOPN: 13.0000 [IU] | PEN_INJECTOR | Freq: Two times a day (BID) | SUBCUTANEOUS | 6 refills | Status: DC

## 2020-05-13 MED ORDER — GABAPENTIN 300 MG PO CAPS: 300.0000 mg | ORAL_CAPSULE | Freq: Every day | ORAL | 6 refills | Status: DC

## 2020-05-13 MED ORDER — ATORVASTATIN CALCIUM 40 MG PO TABS: 40.0000 mg | ORAL_TABLET | Freq: Every day | ORAL | 1 refills | Status: DC

## 2020-05-13 MED ORDER — METFORMIN HCL 1000 MG PO TABS: ORAL_TABLET | ORAL | 1 refills | Status: DC

## 2020-05-13 MED ORDER — DICLOFENAC SODIUM 75 MG PO TBEC: 75.0000 mg | DELAYED_RELEASE_TABLET | Freq: Two times a day (BID) | ORAL | 2 refills | Status: DC

## 2020-05-13 MED FILL — LOSARTAN-HCTZ 100-25 MG TAB: 100-25 | 90 days supply | Qty: 90 | Fill #0

## 2020-05-13 NOTE — Patient Instructions (Signed)
Hypoglycemia Hypoglycemia is when the sugar (glucose) level in your blood is too low. Signs of low blood sugar may include:  Feeling: ? Hungry. ? Worried or nervous (anxious). ? Sweaty and clammy. ? Confused. ? Dizzy. ? Sleepy. ? Sick to your stomach (nauseous).  Having: ? A fast heartbeat. ? A headache. ? A change in your vision. ? Tingling or no feeling (numbness) around your mouth, lips, or tongue. ? Jerky movements that you cannot control (seizure).  Having trouble with: ? Moving (coordination). ? Sleeping. ? Passing out (fainting). ? Getting upset easily (irritability). Low blood sugar can happen to people who have diabetes and people who do not have diabetes. Low blood sugar can happen quickly, and it can be an emergency. Treating low blood sugar Low blood sugar is often treated by eating or drinking something sugary right away, such as:  Fruit juice, 4-6 oz (120-150 mL).  Regular soda (not diet soda), 4-6 oz (120-150 mL).  Low-fat milk, 4 oz (120 mL).  Several pieces of hard candy.  Sugar or honey, 1 Tbsp (15 mL). Treating low blood sugar if you have diabetes If you can think clearly and swallow safely, follow the 15:15 rule:  Take 15 grams of a fast-acting carb (carbohydrate). Talk with your doctor about how much you should take.  Always keep a source of fast-acting carb with you, such as: ? Sugar tablets (glucose pills). Take 3-4 pills. ? 6-8 pieces of hard candy. ? 4-6 oz (120-150 mL) of fruit juice. ? 4-6 oz (120-150 mL) of regular (not diet) soda. ? 1 Tbsp (15 mL) honey or sugar.  Check your blood sugar 15 minutes after you take the carb.  If your blood sugar is still at or below 70 mg/dL (3.9 mmol/L), take 15 grams of a carb again.  If your blood sugar does not go above 70 mg/dL (3.9 mmol/L) after 3 tries, get help right away.  After your blood sugar goes back to normal, eat a meal or a snack within 1 hour.  Treating very low blood sugar If your  blood sugar is at or below 54 mg/dL (3 mmol/L), you have very low blood sugar (severe hypoglycemia). This may also cause:  Passing out.  Jerky movements you cannot control (seizure).  Losing consciousness (coma). This is an emergency. Do not wait to see if the symptoms will go away. Get medical help right away. Call your local emergency services (911 in the [place] Do not drive yourself to the hospital. If you have very low blood sugar and you cannot eat or drink, you may need a glucagon shot (injection). A family member or friend should learn how to check your blood sugar and how to give you a glucagon shot. Ask your doctor if you need to have a glucagon shot kit at home. Follow these instructions at home: General instructions  Take over-the-counter and prescription medicines only as told by your doctor.  Stay aware of your blood sugar as told by your doctor.  Limit alcohol intake to no more than 1 drink a day for nonpregnant women and 2 drinks a day for men. One drink equals 12 oz of beer (355 mL), 5 oz of wine (148 mL), or 1 oz of hard liquor (44 mL).  Keep all follow-up visits as told by your doctor. This is important. If you have diabetes:   Follow your diabetes care plan as told by your doctor. Make sure you: ? Know the signs of low blood sugar. ?  Take your medicines as told. ? Follow your exercise and meal plan. ? Eat on time. Do not skip meals. ? Check your blood sugar as often as told by your doctor. Always check it before and after exercise. ? Follow your sick day plan when you cannot eat or drink normally. Make this plan ahead of time with your doctor.  Share your diabetes care plan with: ? Your work or school. ? People you live with.  Check your pee (urine) for ketones: ? When you are sick. ? As told by your doctor.  Carry a card or wear jewelry that says you have diabetes. Contact a doctor if:  You have trouble keeping your blood sugar in your target  range.  You have low blood sugar often. Get help right away if:  You still have symptoms after you eat or drink something sugary.  Your blood sugar is at or below 54 mg/dL (3 mmol/L).  You have jerky movements that you cannot control.  You pass out. These symptoms may be an emergency. Do not wait to see if the symptoms will go away. Get medical help right away. Call your local emergency services (911 in the [place] Do not drive yourself to the hospital. Summary  Hypoglycemia happens when the level of sugar (glucose) in your blood is too low.  Low blood sugar can happen to people who have diabetes and people who do not have diabetes. Low blood sugar can happen quickly, and it can be an emergency.  Make sure you know the signs of low blood sugar and know how to treat it.  Always keep a source of sugar (fast-acting carb) with you to treat low blood sugar. This information is not intended to replace advice given to you by your health care provider. Make sure you discuss any questions you have with your health care provider. Document Revised: 10/17/2018 Document Reviewed: 07/30/2015 Elsevier Patient Education  2020 Elsevier Inc.  

## 2020-05-13 NOTE — Patient Outreach (Addendum)
[facility]) Care Management  [place]  05/13/2020   Jerry Serrano 1950/07/23 [id number]   [place]  Referral Date:01/07/2019 Referral Source:MD Office Reason for Referral:"DM, HTN help with insulin cost" Insurance:[employer] Medicare   Addendum:  Another unsuccessful outreach attempt.  No answer.  Voicemail message left with return contact information.  RN Health Coach will make another outreach attempt within the month of December if no return call back from patient.   Outreach Attempt:  Outreach attempt #8 to patient for follow up.   Patient answered and stated he was still at dootor's office and unable to talk.  Plan:  RN Health Coach will make another outreach attempt within the month of November if no return call back from patient.   [place] [phone] [email or web address]

## 2020-05-13 NOTE — Progress Notes (Signed)
States that he feel dizzy at time while working.

## 2020-05-13 NOTE — Progress Notes (Signed)
Subjective:  Patient ID: Jerry Serrano, male    DOB: 21-May-1951  Age: 69 y.o. MRN: [id number]  CC: Diabetes   HPI Jerry Serrano is a69 year old male with history of hypertension, hyperlipidemia, type 2 diabetes mellitus (A1c8.1 from 07/2019) who presents today for a  follow-up visit. He has had some hypoglycemic episodes and has been skipping his Lantus about about once or twice a week.  Hypoglycemic episodes usually occur during the day at work as he works with the [employer]. His A1c is 8.1 which is up from 7.6 previously.  Denies presence of blurry vision, numbness in extremities. Compliant with his antihypertensive and his statin. He has no additional concerns today.  Past Medical History:  Diagnosis Date  . Allergy   . Arthritis   . Diabetes mellitus   . Hyperlipidemia   . Hypertension     Past Surgical History:  Procedure Laterality Date  . HERNIA REPAIR      Family History  Problem Relation Age of Onset  . Colon cancer Neg Hx   . Esophageal cancer Neg Hx   . Rectal cancer Neg Hx   . Stomach cancer Neg Hx     No Known Allergies  Outpatient Medications Prior to Visit  Medication Sig Dispense Refill  . aspirin EC 81 MG tablet Take 1 tablet (81 mg total) by mouth daily. 30 tablet 3  . betamethasone dipropionate 0.05 % cream Apply topically 2 (two) times daily. 30 g 0  . Blood Glucose Monitoring Suppl (ACCU-CHEK AVIVA PLUS) w/Device KIT USE AS DIRECTED 4 TIMES DAILY AFTER MEALS AND AT BEDTIME. 1 kit 0  . glucose blood test strip USE AS DIRECTED THREE TIMES DAILY 100 each 12  . ketoconazole (NIZORAL) 2 % cream Apply 1 application topically daily. 15 g 0  . Lancet Devices (ACCU-CHEK SOFTCLIX) lancets Use as instructed 1 each 12  . MICROLET LANCETS MISC Test 3 times per day 100 each 12  . olopatadine (PATANOL) 0.1 % ophthalmic solution Place 1 drop into both eyes 2 (two) times daily.    . pantoprazole (PROTONIX) 40 MG tablet TAKE 1  BY MOUTH ONCE DAILY 90  tablet 0  . sildenafil (VIAGRA) 50 MG tablet Take 1 tablet (50 mg total) by mouth daily as needed for erectile dysfunction. 10 tablet 1  . TRUEPLUS PEN NEEDLES 32G X 4 MM MISC USE AS INSTRUCTED TO INJECT INSULIN DAILY. 100 each 2  . amLODipine (NORVASC) 10 MG tablet Take 1 tablet by mouth once daily 30 tablet 0  . atorvastatin (LIPITOR) 40 MG tablet Take 1 tablet (40 mg total) by mouth daily. 90 tablet 1  . diclofenac (VOLTAREN) 75 MG EC tablet Take 1 tablet (75 mg total) by mouth 2 (two) times daily. 60 tablet 2  . gabapentin (NEURONTIN) 300 MG capsule Take 1 capsule by mouth once daily 30 capsule 0  . GLIPIZIDE XL 10 MG 24 hr tablet TAKE 2 TABLETS (20 MG TOTAL) BY MOUTH DAILY WITH BREAKFAST. 180 tablet 1  . insulin glargine (LANTUS SOLOSTAR) 100 UNIT/ML Solostar Pen Inject 28 Units into the skin at bedtime. 5 pen 5  . losartan-hydrochlorothiazide (HYZAAR) 50-12.5 MG tablet Take 1 tablet by mouth once daily 30 tablet 0  . metFORMIN (GLUCOPHAGE) 1000 MG tablet TAKE 1.5 TABLET ($RemoveBef'1500MG'RTXCugLesa$ ) BY MOUTH IN THE MORNING AND 1 TABLET ($RemoveB'1000MG'IvYadaoa$ ) IN THE EVENING. 225 tablet 1   Facility-Administered Medications Prior to Visit  Medication Dose Route Frequency Provider Last Rate Last Admin  .  0.9 %  sodium chloride infusion  500 mL Intravenous Continuous Danis, Estill Cotta III, MD         ROS Review of Systems  Constitutional: Negative for activity change and appetite change.  HENT: Negative for sinus pressure and sore throat.   Eyes: Negative for visual disturbance.  Respiratory: Negative for cough, chest tightness and shortness of breath.   Cardiovascular: Negative for chest pain and leg swelling.  Gastrointestinal: Negative for abdominal distention, abdominal pain, constipation and diarrhea.  Endocrine: Negative.   Genitourinary: Negative for dysuria.  Musculoskeletal: Negative for joint swelling and myalgias.  Skin: Negative for rash.  Allergic/Immunologic: Negative.   Neurological: Negative for weakness,  light-headedness and numbness.  Psychiatric/Behavioral: Negative for dysphoric mood and suicidal ideas.    Objective:  BP (!) 156/70   Pulse 88   Ht $R'5\' 4"'sd$  (1.626 m)   Wt 152 lb (68.9 kg)   SpO2 98%   BMI 26.09 kg/m   BP/Weight 05/13/2020 11/03/2019 1/61/0960  Systolic BP 454 098 119  Diastolic BP 70 72 68  Wt. (Lbs) 152 153 156  BMI 26.09 26.26 26.78      Physical Exam Constitutional:      Appearance: He is well-developed.  Neck:     Vascular: No JVD.  Cardiovascular:     Rate and Rhythm: Normal rate.     Heart sounds: Normal heart sounds. No murmur heard.   Pulmonary:     Effort: Pulmonary effort is normal.     Breath sounds: Normal breath sounds. No wheezing or rales.  Chest:     Chest wall: No tenderness.  Abdominal:     General: Bowel sounds are normal. There is no distension.     Palpations: Abdomen is soft. There is no mass.     Tenderness: There is no abdominal tenderness.  Musculoskeletal:        General: Normal range of motion.     Right lower leg: No edema.     Left lower leg: No edema.  Neurological:     Mental Status: He is alert and oriented to person, place, and time.  Psychiatric:        Mood and Affect: Mood normal.      Diabetic Foot Exam - Simple   Simple Foot Form Visual Inspection See comments: Yes Sensation Testing Intact to touch and monofilament testing bilaterally: Yes Pulse Check Posterior Tibialis and Dorsalis pulse intact bilaterally: Yes Comments Slightly discolored big toenails bilaterally. No ulceration or skin breakdown     CMP Latest Ref Rng & Units 11/03/2019 07/18/2019 01/07/2019  Glucose 65 - 99 mg/dL 151(H) 83 231(H)  BUN 8 - 27 mg/dL $Remove'19 16 15  'QYZXGal$ Creatinine 0.76 - 1.27 mg/dL 0.99 0.97 0.80  Sodium 134 - 144 mmol/L 140 137 137  Potassium 3.5 - 5.2 mmol/L 4.1 4.3 4.4  Chloride 96 - 106 mmol/L 104 102 100  CO2 20 - 29 mmol/L $RemoveB'21 23 22  'OAeczBPS$ Calcium 8.6 - 10.2 mg/dL 9.6 9.8 9.6  Total Protein 6.0 - 8.5 g/dL - 6.9 7.0  Total  Bilirubin 0.0 - 1.2 mg/dL - 0.4 0.2  Alkaline Phos 39 - 117 IU/L - 88 103  AST 0 - 40 IU/L - 33 19  ALT 0 - 44 IU/L - 38 22    Lipid Panel     Component Value Date/Time   CHOL 152 07/18/2019 1153   TRIG 45 07/18/2019 1153   HDL 47 07/18/2019 1153   CHOLHDL 3.2 07/18/2019 1153  CHOLHDL 2.8 02/29/2016 1018   VLDL 12 02/29/2016 1018   LDLCALC 95 07/18/2019 1153    CBC    Component Value Date/Time   WBC 6.1 07/18/2019 1153   WBC 7.6 05/05/2013 1116   RBC 4.73 07/18/2019 1153   RBC 4.82 05/05/2013 1116   HGB 12.6 (L) 07/18/2019 1153   HCT 37.6 07/18/2019 1153   PLT 368 07/18/2019 1153   MCV 80 07/18/2019 1153   MCH 26.6 07/18/2019 1153   MCH 28.0 05/05/2013 1116   MCHC 33.5 07/18/2019 1153   MCHC 35.7 05/05/2013 1116   RDW 17.0 (H) 07/18/2019 1153   LYMPHSABS 2.0 07/18/2019 1153   MONOABS 0.7 05/05/2013 1116   EOSABS 0.4 07/18/2019 1153   BASOSABS 0.1 07/18/2019 1153    Lab Results  Component Value Date   HGBA1C 8.1 (A) 05/13/2020    Assessment & Plan:  1. Type 2 diabetes mellitus with hypoglycemia without coma, without long-term current use of insulin (HCC) Uncontrolled with A1c of 8.1; goal is less than 7.0 Suboptimal control due to skipping Lantus dose as a result of hypoglycemia I have reduced his Lantus by 2 units and split this into twice daily dosing which will hopefully reduce episodes of hypoglycemia.  He has been advised to notify the clinic if hypoglycemia occurs Counseled on Diabetic diet, my plate method, 938 minutes of moderate intensity exercise/week Blood sugar logs with fasting goals of 80-120 mg/dl, random of less than 180 and in the event of sugars less than 60 mg/dl or greater than 400 mg/dl encouraged to notify the clinic. Advised on the need for annual eye exams, annual foot exams, Pneumonia vaccine. - POCT glucose (manual entry) - POCT glycosylated hemoglobin (Hb A1C) - CMP14+EGFR - Lipid panel - Microalbumin / creatinine urine ratio  2.  Type 2 diabetes mellitus with diabetic polyneuropathy, with long-term current use of insulin (HCC) Neuropathy is controlled on gabapentin - insulin glargine (LANTUS SOLOSTAR) 100 UNIT/ML Solostar Pen; Inject 13 Units into the skin 2 (two) times daily.  Dispense: 30 mL; Refill: 6 - metFORMIN (GLUCOPHAGE) 1000 MG tablet; TAKE 1.5 TABLET ($RemoveBef'1500MG'yxxZZquUVj$ ) BY MOUTH IN THE MORNING AND 1 TABLET ($RemoveB'1000MG'fRYChrhS$ ) IN THE EVENING.  Dispense: 225 tablet; Refill: 1 - glipiZIDE (GLIPIZIDE XL) 10 MG 24 hr tablet; TAKE 2 TABLETS (20 MG TOTAL) BY MOUTH DAILY WITH BREAKFAST.  Dispense: 180 tablet; Refill: 1 - gabapentin (NEURONTIN) 300 MG capsule; Take 1 capsule (300 mg total) by mouth daily.  Dispense: 30 capsule; Refill: 6  3. Essential hypertension Uncontrolled Increased dose of losartan/HCTZ Counseled on blood pressure goal of less than 130/80, low-sodium, DASH diet, medication compliance, 150 minutes of moderate intensity exercise per week. Discussed medication compliance, adverse effects.  - amLODipine (NORVASC) 10 MG tablet; Take 1 tablet (10 mg total) by mouth daily.  Dispense: 90 tablet; Refill: 1  4. Arthritis pain, hand Stable - diclofenac (VOLTAREN) 75 MG EC tablet; Take 1 tablet (75 mg total) by mouth 2 (two) times daily.  Dispense: 60 tablet; Refill: 2  5. Mixed hyperlipidemia Controlled Low-cholesterol diet - atorvastatin (LIPITOR) 40 MG tablet; Take 1 tablet (40 mg total) by mouth daily.  Dispense: 90 tablet; Refill: 1  6. Need for immunization against influenza - Flu Vaccine QUAD 36+ mos IM   Meds ordered this encounter  Medications  . insulin glargine (LANTUS SOLOSTAR) 100 UNIT/ML Solostar Pen    Sig: Inject 13 Units into the skin 2 (two) times daily.    Dispense:  30 mL  Refill:  6    Dose change  . losartan-hydrochlorothiazide (HYZAAR) 100-25 MG tablet    Sig: Take 1 tablet by mouth daily.    Dispense:  90 tablet    Refill:  1    Discontinue losartan 50/12.5 mg  . metFORMIN (GLUCOPHAGE)  1000 MG tablet    Sig: TAKE 1.5 TABLET ($RemoveBef'1500MG'PoluzoEUOu$ ) BY MOUTH IN THE MORNING AND 1 TABLET ($RemoveB'1000MG'AGfeJUZr$ ) IN THE EVENING.    Dispense:  225 tablet    Refill:  1  . glipiZIDE (GLIPIZIDE XL) 10 MG 24 hr tablet    Sig: TAKE 2 TABLETS (20 MG TOTAL) BY MOUTH DAILY WITH BREAKFAST.    Dispense:  180 tablet    Refill:  1  . gabapentin (NEURONTIN) 300 MG capsule    Sig: Take 1 capsule (300 mg total) by mouth daily.    Dispense:  30 capsule    Refill:  6  . diclofenac (VOLTAREN) 75 MG EC tablet    Sig: Take 1 tablet (75 mg total) by mouth 2 (two) times daily.    Dispense:  60 tablet    Refill:  2  . atorvastatin (LIPITOR) 40 MG tablet    Sig: Take 1 tablet (40 mg total) by mouth daily.    Dispense:  90 tablet    Refill:  1  . amLODipine (NORVASC) 10 MG tablet    Sig: Take 1 tablet (10 mg total) by mouth daily.    Dispense:  90 tablet    Refill:  1    Follow-up: Return in about 3 months (around 08/13/2020) for Chronic disease management.       Charlott Rakes, MD, FAAFP. [facility] [place], [place]   05/13/2020, 4:49 PM

## 2020-05-14 LAB — LIPID PANEL
Chol/HDL Ratio: 3.1 ratio (ref 0.0–5.0)
Cholesterol, Total: 132 mg/dL (ref 100–199)
HDL: 42 mg/dL (ref >39)
LDL Chol Calc (NIH): 77 mg/dL (ref 0–99)
Triglycerides: 60 mg/dL (ref 0–149)
VLDL Cholesterol Cal: 13 mg/dL (ref 5–40)

## 2020-05-14 LAB — CMP14+EGFR
ALT: 24 IU/L (ref 0–44)
AST: 25 IU/L (ref 0–40)
Albumin/Globulin Ratio: 2 (ref 1.2–2.2)
Albumin: 5.2 g/dL — ABNORMAL HIGH (ref 3.7–4.7)
Alkaline Phosphatase: 83 IU/L (ref 44–121)
BUN/Creatinine Ratio: 16 (ref 10–24)
BUN: 17 mg/dL (ref 8–27)
Bilirubin Total: 0.5 mg/dL (ref 0.0–1.2)
CO2: 25 mmol/L (ref 20–29)
Calcium: 10.2 mg/dL (ref 8.6–10.2)
Chloride: 100 mmol/L (ref 96–106)
Creatinine, Ser: 1.04 mg/dL (ref 0.76–1.27)
GFR calc Af Amer: 83 mL/min/{1.73_m2} (ref >59)
GFR calc non Af Amer: 72 mL/min/{1.73_m2} (ref >59)
Globulin, Total: 2.6 g/dL (ref 1.5–4.5)
Glucose: 131 mg/dL — ABNORMAL HIGH (ref 65–99)
Potassium: 4.7 mmol/L (ref 3.5–5.2)
Sodium: 138 mmol/L (ref 134–144)
Total Protein: 7.8 g/dL (ref 6.0–8.5)

## 2020-05-14 LAB — MICROALBUMIN / CREATININE URINE RATIO
Creatinine, Urine: 41.4 mg/dL
Microalb/Creat Ratio: 22 mg/g creat (ref 0–29)
Microalbumin, Urine: 9.1 ug/mL

## 2020-05-17 ENCOUNTER — Telehealth: Payer: Self-pay

## 2020-05-17 NOTE — Telephone Encounter (Signed)
-----   Message from Charlott Rakes, MD sent at 05/14/2020  2:18 PM EDT ----- Cholesterol is normal , other labs are stable

## 2020-05-17 NOTE — Telephone Encounter (Signed)
Patient name and DOB has been verified Patient was informed of lab results. Patient had no questions.  

## 2020-05-30 ENCOUNTER — Other Ambulatory Visit: Payer: Self-pay | Admitting: Family Medicine

## 2020-05-30 DIAGNOSIS — I10 Essential (primary) hypertension: Secondary | ICD-10-CM

## 2020-05-30 DIAGNOSIS — Z794 Long term (current) use of insulin: Secondary | ICD-10-CM

## 2020-05-30 DIAGNOSIS — E1142 Type 2 diabetes mellitus with diabetic polyneuropathy: Secondary | ICD-10-CM

## 2020-05-31 MED FILL — glipiZIDE XL 10 MG TB24: 10 | 90 days supply | Qty: 180 | Fill #0

## 2020-05-31 MED FILL — DICLOFENAC SOD EC 75 MG TAB: 75 | 30 days supply | Qty: 60 | Fill #2

## 2020-05-31 MED FILL — TRUEplus 5-BEVEL PEN NEEDLE: 32G X 4 MM | 25 days supply | Qty: 100 | Fill #1

## 2020-06-15 ENCOUNTER — Other Ambulatory Visit: Payer: Self-pay | Admitting: Family Medicine

## 2020-06-15 DIAGNOSIS — K219 Gastro-esophageal reflux disease without esophagitis: Secondary | ICD-10-CM

## 2020-06-15 NOTE — Telephone Encounter (Signed)
  Notes to clinic:  medication was filled by a different office Review for refill   Requested Prescriptions  Pending Prescriptions Disp Refills   pantoprazole (PROTONIX) 40 MG tablet [Pharmacy Med Name: PANTOPRAZOLE SOD DR 40 MG T 40 Tablet] 30 tablet 5    Sig: TAKE 1 TABLET BY MOUTH DAILY.      Gastroenterology: Proton Pump Inhibitors Passed - 06/15/2020  3:19 PM      Passed - Valid encounter within last 12 months    Recent Outpatient Visits           1 month ago Type 2 diabetes mellitus with hypoglycemia without coma, without long-term current use of insulin ([place])   [place], [place], MD   7 months ago Type 2 diabetes mellitus with hypoglycemia without coma, without long-term current use of insulin ([place])   [place], [place], MD   10 months ago Type 2 diabetes mellitus with other specified complication, without long-term current use of insulin ([place])   [place], [place], MD   11 months ago Type 2 diabetes mellitus with diabetic polyneuropathy, with long-term current use of insulin [facility])   [facility], [place], NP   1 year ago Type 2 diabetes mellitus with diabetic polyneuropathy, with long-term current use of insulin ([place])   [place], [place], MD       Future Appointments             In 2 months Charlott Rakes, MD [place]

## 2020-06-16 MED FILL — PANTOPRAZOLE SOD DR 40 MG T: 40 | 30 days supply | Qty: 30 | Fill #0

## 2020-06-24 ENCOUNTER — Other Ambulatory Visit: Payer: Self-pay | Admitting: *Deleted

## 2020-06-24 NOTE — Patient Outreach (Signed)
[place] [facility]) Care Management  06/24/2020  [place] [birth year] [id number]   [place]  Referral Date:01/07/2019 Referral Source:MD Office Reason for Referral:"DM, HTN help with insulin cost" Insurance:[employer] Medicare   Outreach Attempt: Outreach attempt #9 to patient for follow up. No answer. RN Health Coach left voicemail message along with contact information.   Plan:  RN Health Coach will make another outreach attempt within the month of January if no return call back from patient.   [place] [phone] [email or web address]

## 2020-06-30 MED FILL — DICLOFENAC SOD EC 75 MG TAB: 75 | 30 days supply | Qty: 60 | Fill #0

## 2020-07-13 MED FILL — PANTOPRAZOLE SOD DR 40 MG T: 40 | 30 days supply | Qty: 30 | Fill #1

## 2020-07-23 MED FILL — METFORMIN HCL 1000 MG TABS: 1000 | 90 days supply | Qty: 225 | Fill #0

## 2020-07-23 MED FILL — AMLODIPINE BESYLATE 10 MG T: 10 | 90 days supply | Qty: 90 | Fill #1

## 2020-07-23 MED FILL — DICLOFENAC SOD EC 75 MG TAB: 75 | 30 days supply | Qty: 60 | Fill #1

## 2020-07-30 MED FILL — LANTUS SOLOSTAR 100 UNITS/M: 100 | 30 days supply | Qty: 9 | Fill #7

## 2020-08-02 ENCOUNTER — Other Ambulatory Visit: Payer: Self-pay | Admitting: Family Medicine

## 2020-08-03 ENCOUNTER — Other Ambulatory Visit: Payer: Self-pay | Admitting: Family Medicine

## 2020-08-04 MED FILL — ACCU-CHEK GUIDE ME W/DEVICE: W/DEVICE | 1 days supply | Qty: 1 | Fill #0

## 2020-08-05 ENCOUNTER — Other Ambulatory Visit: Payer: Self-pay | Admitting: *Deleted

## 2020-08-05 NOTE — Patient Outreach (Signed)
[place] [facility]) Care Management  08/05/2020  Jerry Serrano Oct 25, 1950 [id number]   RN Health Coach attempted follow up outreach call to patient.  Patient was unavailable. HIPPA compliance voicemail message left with return callback number.  Plan: Unsuccessful outreach letter sent RN will call patient again within 30 days.  [facility] [phone]

## 2020-08-06 ENCOUNTER — Telehealth: Payer: Self-pay | Admitting: Family Medicine

## 2020-08-06 DIAGNOSIS — Z794 Long term (current) use of insulin: Secondary | ICD-10-CM

## 2020-08-06 DIAGNOSIS — E1142 Type 2 diabetes mellitus with diabetic polyneuropathy: Secondary | ICD-10-CM

## 2020-08-06 MED ORDER — GLUCOSE BLOOD VI STRP: ORAL_STRIP | 2 refills | Status: DC

## 2020-08-06 NOTE — Telephone Encounter (Signed)
Pharmacy called to request a new script for patient's test strips because they also need a diagnosis code.  Please advise and call to discuss at [phone]

## 2020-08-06 NOTE — Telephone Encounter (Signed)
Resent with dx code.  ?

## 2020-08-11 ENCOUNTER — Other Ambulatory Visit: Payer: Self-pay | Admitting: *Deleted

## 2020-08-12 NOTE — Patient Outreach (Signed)
[facility]) Care Management  [place]  08/12/2020   Trust Leh Maske 1951-01-01 [id number]  RN Health Coach telephone call to patient.  Hipaa compliance verified. Per patient he had not checked his blood sugar today. Patient does have an meter.  RN discussed with patient about checking blood sugars and eating healthy to prevent hypoglycemia at work. Patient stated he is working a lot of hours but will return call if a message is left. Patient has followed up with eye exam. Patient has agreed in follow up outreach calls.   Encounter Medications:  Outpatient Encounter Medications as of 08/11/2020  Medication Sig  . amLODipine (NORVASC) 10 MG tablet Take 1 tablet (10 mg total) by mouth daily.  Marland Kitchen aspirin EC 81 MG tablet Take 1 tablet (81 mg total) by mouth daily.  Marland Kitchen atorvastatin (LIPITOR) 40 MG tablet Take 1 tablet (40 mg total) by mouth daily.  . betamethasone dipropionate 0.05 % cream Apply topically 2 (two) times daily.  . Blood Glucose Monitoring Suppl (ACCU-CHEK AVIVA PLUS) w/Device KIT USE AS DIRECTED 4 TIMES DAILY AFTER MEALS AND AT BEDTIME.  Marland Kitchen diclofenac (VOLTAREN) 75 MG EC tablet Take 1 tablet (75 mg total) by mouth 2 (two) times daily.  Marland Kitchen gabapentin (NEURONTIN) 300 MG capsule Take 1 capsule (300 mg total) by mouth daily.  Marland Kitchen glipiZIDE (GLIPIZIDE XL) 10 MG 24 hr tablet TAKE 2 TABLETS (20 MG TOTAL) BY MOUTH DAILY WITH BREAKFAST.  Marland Kitchen glucose blood test strip Use to check blood sugar TID. E11.42  . insulin glargine (LANTUS SOLOSTAR) 100 UNIT/ML Solostar Pen Inject 13 Units into the skin 2 (two) times daily.  Marland Kitchen ketoconazole (NIZORAL) 2 % cream Apply 1 application topically daily.  Elmore Guise Devices (ACCU-CHEK SOFTCLIX) lancets Use as instructed  . losartan-hydrochlorothiazide (HYZAAR) 100-25 MG tablet Take 1 tablet by mouth daily.  . metFORMIN (GLUCOPHAGE) 1000 MG tablet TAKE 1.5 TABLET (1500MG) BY MOUTH IN THE MORNING AND 1 TABLET (1000MG) IN THE EVENING.  Charlott Holler  LANCETS MISC Test 3 times per day  . olopatadine (PATANOL) 0.1 % ophthalmic solution Place 1 drop into both eyes 2 (two) times daily.  . pantoprazole (PROTONIX) 40 MG tablet TAKE 1 TABLET BY MOUTH DAILY.  . sildenafil (VIAGRA) 50 MG tablet Take 1 tablet (50 mg total) by mouth daily as needed for erectile dysfunction.  . TRUEPLUS PEN NEEDLES 32G X 4 MM MISC USE AS INSTRUCTED TO INJECT INSULIN DAILY.   Facility-Administered Encounter Medications as of 08/11/2020  Medication  . 0.9 %  sodium chloride infusion    Functional Status:  No flowsheet data found.  Fall/Depression Screening: Fall Risk  08/11/2020 05/13/2020 11/03/2019  Falls in the past year? 0 0 0  Number falls in past yr: 0 - -  Injury with Fall? 0 - -  Risk for fall due to : Impaired vision - -  Follow up - - -   PHQ 2/9 Scores 07/18/2019 07/18/2019 02/06/2019 01/07/2019 01/07/2019 09/24/2018 06/04/2018  PHQ - 2 Score 0 0 0 0 0 0 0  PHQ- 9 Score - - - - 0 1 0    Assessment:  Goals Addressed            This Visit's Progress   . ((THN)Obtain Eye Exam-Diabetes Type 2       Timeframe:  Long-Range Goal Priority:  Medium Start Date:      26948546  Expected End Date:    99242683                  Follow Up Date 41962229   - schedule appointment with eye doctor    Why is this important?    Eye check-ups are important when you have diabetes.   Vision loss can be prevented.    Notes:  Last eye 04/05/2020    . (THN)Monitor and Manage My Blood Sugar-Diabetes Type 2       Timeframe:  Long-Range Goal Priority:  High Start Date:   79892119                          Expected End Date:      41740814                 Follow Up Date 48185631   - check blood sugar at prescribed times - check blood sugar if I feel it is too high or too low - enter blood sugar readings and medication or insulin into daily log - take the blood sugar log to all doctor visits - take the blood sugar meter to all doctor visits     Why is this important?    Checking your blood sugar at home helps to keep it from getting very high or very low.   Writing the results in a diary or log helps the doctor know how to care for you.   Your blood sugar log should have the time, date and the results.   Also, write down the amount of insulin or other medicine that you take.   Other information, like what you ate, exercise done and how you were feeling, will also be helpful.     Notes:     . (THN)Perform Foot Care-Diabetes Type 2       Timeframe:  Long-Range Goal Priority:  Medium Start Date: 49702637                            Expected End Date:    85885027                   Follow Up Date 74128786   - check feet daily for cuts, sores or redness - trim toenails straight across - wash and dry feet carefully every day - wear comfortable, cotton socks - wear comfortable, well-fitting shoes    Why is this important?    Good foot care is very important when you have diabetes.   There are many things you can do to keep your feet healthy and catch a problem early.    Notes:     . (THN)Set My Target A1C-Diabetes Type 2       Timeframe:  Long-Range Goal Priority:  High Start Date:    76720947                        Expected End Date:  09628366                     Follow Up Date 29476546   - set target A1C    Why is this important?    Your target A1C is decided together by you and your doctor.   It is based on several things like your age and other health issues.    Note 7.0 goal A1C is 8.1  Plan:  Follow-up:  Patient agrees to Care Plan and Follow-up. Provided a calendar book Provided Dining out menus for diabetics Provided a list of low carb snacks Provided educational material on what to drink beside water for diabetics Provided educational picture sheet on Hypo and Hyperglycemia RN ordered government free COVID kits for patient RN will follow up within the month of May RN sent update  assessment to PCP  [facility] [phone]

## 2020-08-12 NOTE — Patient Instructions (Signed)
Goals Addressed            This Visit's Progress   . ((THN)Obtain Eye Exam-Diabetes Type 2       Timeframe:  Long-Range Goal Priority:  Medium Start Date:      21308657                       Expected End Date:    84696295                  Follow Up Date 28413244   - schedule appointment with eye doctor    Why is this important?    Eye check-ups are important when you have diabetes.   Vision loss can be prevented.    Notes:  Last eye 04/05/2020    . (THN)Monitor and Manage My Blood Sugar-Diabetes Type 2       Timeframe:  Long-Range Goal Priority:  High Start Date:   01027253                          Expected End Date:      66440347                 Follow Up Date 42595638   - check blood sugar at prescribed times - check blood sugar if I feel it is too high or too low - enter blood sugar readings and medication or insulin into daily log - take the blood sugar log to all doctor visits - take the blood sugar meter to all doctor visits    Why is this important?    Checking your blood sugar at home helps to keep it from getting very high or very low.   Writing the results in a diary or log helps the doctor know how to care for you.   Your blood sugar log should have the time, date and the results.   Also, write down the amount of insulin or other medicine that you take.   Other information, like what you ate, exercise done and how you were feeling, will also be helpful.     Notes:     . (THN)Perform Foot Care-Diabetes Type 2       Timeframe:  Long-Range Goal Priority:  Medium Start Date: 75643329                            Expected End Date:    51884166                   Follow Up Date 06301601   - check feet daily for cuts, sores or redness - trim toenails straight across - wash and dry feet carefully every day - wear comfortable, cotton socks - wear comfortable, well-fitting shoes    Why is this important?    Good foot care is very important when you  have diabetes.   There are many things you can do to keep your feet healthy and catch a problem early.    Notes:     . (THN)Set My Target A1C-Diabetes Type 2       Timeframe:  Long-Range Goal Priority:  High Start Date:    09323557                        Expected End Date:  32202542  Follow Up Date 64680321   - set target A1C    Why is this important?    Your target A1C is decided together by you and your doctor.   It is based on several things like your age and other health issues.    Note 7.0 goal A1C is 8.1

## 2020-08-16 ENCOUNTER — Ambulatory Visit: Payer: Medicare Other | Attending: Family Medicine | Admitting: Family Medicine

## 2020-08-16 ENCOUNTER — Other Ambulatory Visit: Payer: Self-pay | Admitting: Family Medicine

## 2020-08-16 ENCOUNTER — Other Ambulatory Visit: Payer: Self-pay

## 2020-08-16 ENCOUNTER — Encounter: Payer: Self-pay | Admitting: Family Medicine

## 2020-08-16 VITALS — BP 145/73 | HR 89 | Ht 64.0 in | Wt 145.8 lb

## 2020-08-16 DIAGNOSIS — E1142 Type 2 diabetes mellitus with diabetic polyneuropathy: Secondary | ICD-10-CM | POA: Diagnosis not present

## 2020-08-16 DIAGNOSIS — E11649 Type 2 diabetes mellitus with hypoglycemia without coma: Secondary | ICD-10-CM | POA: Diagnosis not present

## 2020-08-16 DIAGNOSIS — Z794 Long term (current) use of insulin: Secondary | ICD-10-CM

## 2020-08-16 DIAGNOSIS — I10 Essential (primary) hypertension: Secondary | ICD-10-CM | POA: Diagnosis not present

## 2020-08-16 LAB — GLUCOSE, POCT (MANUAL RESULT ENTRY): POC Glucose: 97 mg/dl (ref 70–99)

## 2020-08-16 LAB — POCT GLYCOSYLATED HEMOGLOBIN (HGB A1C): HbA1c, POC (controlled diabetic range): 7.9 % — AB (ref 0.0–7.0)

## 2020-08-16 MED ORDER — LANTUS SOLOSTAR 100 UNIT/ML ~~LOC~~ SOPN: 10.0000 [IU] | PEN_INJECTOR | Freq: Two times a day (BID) | SUBCUTANEOUS | 6 refills | Status: DC

## 2020-08-16 NOTE — Progress Notes (Signed)
Subjective:  Patient ID: Jerry Serrano, male    DOB: 1950/10/22  Age: 70 y.o. MRN: [id number]  CC: Diabetes   HPI FABIEN TRAVELSTEAD is a41 year old male with history of hypertension, hyperlipidemia, type 2 diabetes mellitus (A1c7.58from 07/2019) who presents today for a  follow-up visit.  He has had low sugars which cause him to be confused and he has to take some Orange juice and this is common during the day when he is at work. Sugars drop to 56-60. He skips his insulin when 'his sugar is good' - yesterday was 111 so he skipped his insulin and did not take his insulin this morning. When his sugars are <120 he does not take his Lantus He skips breakfast because he is usually in a rush to go to work and sometimes when he gets home at 10 PM he does not feel like eating and might skip his dinner. Compliant with his antihypertensive and statin. He has no additional concerns today.  Past Medical History:  Diagnosis Date  . Allergy   . Arthritis   . Diabetes mellitus   . Hyperlipidemia   . Hypertension     Past Surgical History:  Procedure Laterality Date  . HERNIA REPAIR      Family History  Problem Relation Age of Onset  . Colon cancer Neg Hx   . Esophageal cancer Neg Hx   . Rectal cancer Neg Hx   . Stomach cancer Neg Hx     No Known Allergies  Outpatient Medications Prior to Visit  Medication Sig Dispense Refill  . amLODipine (NORVASC) 10 MG tablet Take 1 tablet (10 mg total) by mouth daily. 90 tablet 1  . aspirin EC 81 MG tablet Take 1 tablet (81 mg total) by mouth daily. 30 tablet 3  . atorvastatin (LIPITOR) 40 MG tablet Take 1 tablet (40 mg total) by mouth daily. 90 tablet 1  . betamethasone dipropionate 0.05 % cream Apply topically 2 (two) times daily. 30 g 0  . Blood Glucose Monitoring Suppl (ACCU-CHEK AVIVA PLUS) w/Device KIT USE AS DIRECTED 4 TIMES DAILY AFTER MEALS AND AT BEDTIME. 1 kit 0  . diclofenac (VOLTAREN) 75 MG EC tablet Take 1 tablet (75 mg total) by  mouth 2 (two) times daily. 60 tablet 2  . gabapentin (NEURONTIN) 300 MG capsule Take 1 capsule (300 mg total) by mouth daily. 30 capsule 6  . glipiZIDE (GLIPIZIDE XL) 10 MG 24 hr tablet TAKE 2 TABLETS (20 MG TOTAL) BY MOUTH DAILY WITH BREAKFAST. 180 tablet 1  . glucose blood test strip Use to check blood sugar TID. E11.42 100 each 2  . ketoconazole (NIZORAL) 2 % cream Apply 1 application topically daily. 15 g 0  . Lancet Devices (ACCU-CHEK SOFTCLIX) lancets Use as instructed 1 each 12  . losartan-hydrochlorothiazide (HYZAAR) 100-25 MG tablet Take 1 tablet by mouth daily. 90 tablet 1  . metFORMIN (GLUCOPHAGE) 1000 MG tablet TAKE 1.5 TABLET ($RemoveBef'1500MG'IhLSHZAzsn$ ) BY MOUTH IN THE MORNING AND 1 TABLET ($RemoveB'1000MG'zYccifAL$ ) IN THE EVENING. 225 tablet 1  . MICROLET LANCETS MISC Test 3 times per day 100 each 12  . olopatadine (PATANOL) 0.1 % ophthalmic solution Place 1 drop into both eyes 2 (two) times daily.    . TRUEPLUS PEN NEEDLES 32G X 4 MM MISC USE AS INSTRUCTED TO INJECT INSULIN DAILY. 100 each 2  . insulin glargine (LANTUS SOLOSTAR) 100 UNIT/ML Solostar Pen Inject 13 Units into the skin 2 (two) times daily. 30 mL 6  . pantoprazole (  PROTONIX) 40 MG tablet TAKE 1 TABLET BY MOUTH DAILY. (Patient not taking: Reported on 08/16/2020) 30 tablet 5  . sildenafil (VIAGRA) 50 MG tablet Take 1 tablet (50 mg total) by mouth daily as needed for erectile dysfunction. (Patient not taking: Reported on 08/16/2020) 10 tablet 1   Facility-Administered Medications Prior to Visit  Medication Dose Route Frequency Provider Last Rate Last Admin  . 0.9 %  sodium chloride infusion  500 mL Intravenous Continuous Danis, Estill Cotta III, MD         ROS Review of Systems  Constitutional: Negative for activity change and appetite change.  HENT: Negative for sinus pressure and sore throat.   Eyes: Negative for visual disturbance.  Respiratory: Negative for cough, chest tightness and shortness of breath.   Cardiovascular: Negative for chest pain and leg  swelling.  Gastrointestinal: Negative for abdominal distention, abdominal pain, constipation and diarrhea.  Endocrine: Negative.   Genitourinary: Negative for dysuria.  Musculoskeletal: Negative for joint swelling and myalgias.  Skin: Negative for rash.  Allergic/Immunologic: Negative.   Neurological: Negative for weakness, light-headedness and numbness.  Psychiatric/Behavioral: Negative for dysphoric mood and suicidal ideas.    Objective:  BP (!) 145/73   Pulse 89   Ht $R'5\' 4"'ft$  (1.626 m)   Wt 145 lb 12.8 oz (66.1 kg)   SpO2 98%   BMI 25.03 kg/m   BP/Weight 08/16/2020 05/13/2020 2/59/5638  Systolic BP 756 433 295  Diastolic BP 73 70 72  Wt. (Lbs) 145.8 152 153  BMI 25.03 26.09 26.26      Physical Exam Constitutional:      Appearance: He is well-developed.  Neck:     Vascular: No JVD.  Cardiovascular:     Rate and Rhythm: Normal rate.     Heart sounds: Normal heart sounds. No murmur heard.   Pulmonary:     Effort: Pulmonary effort is normal.     Breath sounds: Normal breath sounds. No wheezing or rales.  Chest:     Chest wall: No tenderness.  Abdominal:     General: Bowel sounds are normal. There is no distension.     Palpations: Abdomen is soft. There is no mass.     Tenderness: There is no abdominal tenderness.  Musculoskeletal:        General: Normal range of motion.     Right lower leg: No edema.     Left lower leg: No edema.  Neurological:     Mental Status: He is alert and oriented to person, place, and time.  Psychiatric:        Mood and Affect: Mood normal.    Diabetic Foot Exam - Simple   Simple Foot Form Diabetic Foot exam was performed with the following findings: Yes 08/16/2020  9:04 AM  Visual Inspection See comments: Yes Sensation Testing Intact to touch and monofilament testing bilaterally: Yes Pulse Check Posterior Tibialis and Dorsalis pulse intact bilaterally: Yes Comments Dystrophic big toenails bilaterally     CMP Latest Ref Rng &  Units 05/13/2020 11/03/2019 07/18/2019  Glucose 65 - 99 mg/dL 131(H) 151(H) 83  BUN 8 - 27 mg/dL $Remove'17 19 16  'PxSRhfY$ Creatinine 0.76 - 1.27 mg/dL 1.04 0.99 0.97  Sodium 134 - 144 mmol/L 138 140 137  Potassium 3.5 - 5.2 mmol/L 4.7 4.1 4.3  Chloride 96 - 106 mmol/L 100 104 102  CO2 20 - 29 mmol/L $RemoveB'25 21 23  'yiELnYQL$ Calcium 8.6 - 10.2 mg/dL 10.2 9.6 9.8  Total Protein 6.0 - 8.5 g/dL 7.8 -  6.9  Total Bilirubin 0.0 - 1.2 mg/dL 0.5 - 0.4  Alkaline Phos 44 - 121 IU/L 83 - 88  AST 0 - 40 IU/L 25 - 33  ALT 0 - 44 IU/L 24 - 38    Lipid Panel     Component Value Date/Time   CHOL 132 05/13/2020 1130   TRIG 60 05/13/2020 1130   HDL 42 05/13/2020 1130   CHOLHDL 3.1 05/13/2020 1130   CHOLHDL 2.8 02/29/2016 1018   VLDL 12 02/29/2016 1018   LDLCALC 77 05/13/2020 1130    CBC    Component Value Date/Time   WBC 6.1 07/18/2019 1153   WBC 7.6 05/05/2013 1116   RBC 4.73 07/18/2019 1153   RBC 4.82 05/05/2013 1116   HGB 12.6 (L) 07/18/2019 1153   HCT 37.6 07/18/2019 1153   PLT 368 07/18/2019 1153   MCV 80 07/18/2019 1153   MCH 26.6 07/18/2019 1153   MCH 28.0 05/05/2013 1116   MCHC 33.5 07/18/2019 1153   MCHC 35.7 05/05/2013 1116   RDW 17.0 (H) 07/18/2019 1153   LYMPHSABS 2.0 07/18/2019 1153   MONOABS 0.7 05/05/2013 1116   EOSABS 0.4 07/18/2019 1153   BASOSABS 0.1 07/18/2019 1153    Lab Results  Component Value Date   HGBA1C 7.9 (A) 08/16/2020    Assessment & Plan:  1. Type 2 diabetes mellitus with hypoglycemia without coma, with long-term current use of insulin (HCC) A1c 7.9; for his age his goal will be less than 7.5 He has had hypoglycemic episodes due to skipping meals and this has also affected compliance with insulin I have decrease Lantus dose from 13 units twice daily down to 10 units twice daily and he has been advised against skipping insulin.  Advised to eat regularly and eat fruit even if he does not feel like eating food.  In those instances where he has a very light meal he can reduce his  Lantus to 5 units but should not totally skip his Lantus. I will have him follow-up with the Pharm.D. to review his blood sugar log and ensure comprehension.  He knows to call the clinic in the event that he has hypoglycemia Counseled on Diabetic diet, my plate method, 188 minutes of moderate intensity exercise/week Blood sugar logs with fasting goals of 80-120 mg/dl, random of less than 180 and in the event of sugars less than 60 mg/dl or greater than 400 mg/dl encouraged to notify the clinic. Advised on the need for annual eye exams, annual foot exams, Pneumonia vaccine.   2. Type 2 diabetes mellitus with diabetic polyneuropathy, with long-term current use of insulin (HCC) Neuropathy is controlled - POCT glucose (manual entry) - POCT glycosylated hemoglobin (Hb A1C) - insulin glargine (LANTUS SOLOSTAR) 100 UNIT/ML Solostar Pen; Inject 10 Units into the skin 2 (two) times daily.  Dispense: 30 mL; Refill: 6  3. Essential hypertension Blood pressure is slightly above goal We will hold off on making regimen changes today and review at his next visit Low-sodium, DASH diet    Meds ordered this encounter  Medications  . insulin glargine (LANTUS SOLOSTAR) 100 UNIT/ML Solostar Pen    Sig: Inject 10 Units into the skin 2 (two) times daily.    Dispense:  30 mL    Refill:  6    Dose change    Follow-up: Return in about 1 week (around 08/23/2020) for Blood sugar log reviewed with Lurena Joiner; 3 months with PCP.       Charlott Rakes, MD, FAAFP.  [facility] and [place], [place]   08/16/2020, 9:06 AM

## 2020-08-16 NOTE — Patient Instructions (Signed)
Hypoglycemia Hypoglycemia is when the sugar (glucose) level in your blood is too low. Low blood sugar can happen to people who have diabetes and people who do not have diabetes. Low blood sugar can happen quickly, and it can be an emergency. What are the causes? This condition happens most often in people who have diabetes and may be caused by:  Diabetes medicine.  Not eating enough, or not eating often enough.  Doing more physical activity.  Drinking alcohol on an empty stomach. If you do not have diabetes, hypoglycemia may be caused by:  A tumor in the pancreas.  Not eating enough, or not eating for long periods at a time (fasting).  A very bad infection or illness.  Problems after having weight loss (bariatric) surgery.  Kidney failure or liver failure.  Certain medicines. What increases the risk? This condition is more likely to develop in people who:  Have diabetes and take medicines to lower their blood sugar.  Abuse alcohol.  Have a very bad illness. What are the signs or symptoms? Symptoms depend on whether your low blood sugar is mild, moderate, or very low. Mild  Hunger.  Feeling worried or nervous (anxious).  Sweating and feeling clammy.  Feeling dizzy or light-headed.  Being sleepy or having trouble sleeping.  Feeling like you may vomit (nauseous).  A fast heartbeat.  A headache.  Blurry vision.  Being irritable or grouchy.  Tingling or loss of feeling (numbness) around your mouth, lips, or tongue.  Trouble with moving (coordination). Moderate  Confusion and poor judgment.  Behavior changes.  Weakness.  Uneven heartbeats. Very low Very low blood sugar (severe hypoglycemia) is a medical emergency. It can cause:  Fainting.  Jerky movements that you cannot control (seizure).  Loss of consciousness (coma).  Death. How is this treated? Treating low blood sugar Low blood sugar is often treated by eating or drinking something  sugary right away. The snack should contain 15 grams of a fast-acting carb (carbohydrate). Options include:  4 oz (120 mL) of fruit juice.  4-6 oz (120-150 mL) of regular soda (not diet soda).  8 oz (240 mL) of low-fat milk.  Several pieces of hard candy. Check food labels to find out how many to eat for 15 grams.  1 Tbsp (15 mL) of sugar or honey. Treating low blood sugar if you have diabetes If you can think clearly and swallow safely, follow the 15:15 rule:  Take 15 grams of a fast-acting carb. Talk with your doctor about how much you should take.  Always keep a source of fast-acting carb with you, such as: ? Sugar tablets (glucose pills). Take 4 pills. ? Several pieces of hard candy. Check food labels to see how many pieces to eat for 15 grams. ? 4 oz (120 mL) of fruit juice. ? 4-6 oz (120-150 mL) of regular (not diet) soda. ? 1 Tbsp (15 mL) of honey or sugar.  Check your blood sugar 15 minutes after you take the carb.  If your blood sugar is still at or below 70 mg/dL (3.9 mmol/L), take 15 grams of a carb again.  If your blood sugar does not go above 70 mg/dL (3.9 mmol/L) after 3 tries, get help right away.  After your blood sugar goes back to normal, eat a meal or a snack within 1 hour.   Treating very low blood sugar If your blood sugar is at or below 54 mg/dL (3 mmol/L), you have very low blood sugar, or severe   hypoglycemia. This is an emergency. Get medical help right away. If you have very low blood sugar and you cannot eat or drink, you will need to be given a hormone called glucagon. A family member or friend should learn how to check your blood sugar and how to give you glucagon. Ask your doctor if you need to have an emergency glucagon kit at home. Very low blood sugar may also need to be treated in a hospital. Follow these instructions at home: General instructions  Take over-the-counter and prescription medicines only as told by your doctor.  Stay aware of your  blood sugar as told by your doctor.  If you drink alcohol: ? Limit how much you use to:  0-1 drink a day for nonpregnant women.  0-2 drinks a day for men. ? Be aware of how much alcohol is in your drink. In the [place] one drink equals one 12 oz bottle of beer (355 mL), one 5 oz glass of wine (148 mL), or one 1 oz glass of hard liquor (44 mL).  Keep all follow-up visits as told by your doctor. This is important. If you have diabetes:  Always have a rapid-acting carb (15 grams) option with you to treat low blood sugar.  Follow your diabetes care plan as told by your doctor. Make sure you: ? Know the symptoms of low blood sugar. ? Check your blood sugar as often as told by your doctor. Always check it before and after exercise. ? Always check your blood sugar before you drive. ? Take your medicines as told. ? Follow your meal plan. ? Eat on time. Do not skip meals.  Share your diabetes care plan with: ? Your work or school. ? People you live with.  Carry a card or wear jewelry that says you have diabetes.   Contact a doctor if:  You have trouble keeping your blood sugar in your target range.  You have low blood sugar often. Get help right away if:  You still have symptoms after you eat or drink something that contains 15 grams of fast-acting carb and you cannot get your blood sugar above 70 mg/dL by following the 15:15 rule.  Your blood sugar is at or below 54 mg/dL (3 mmol/L).  You have a seizure.  You faint. These symptoms may be an emergency. Do not wait to see if the symptoms will go away. Get medical help right away. Call your local emergency services (911 in the [place] Do not drive yourself to the hospital. Summary  Hypoglycemia happens when the level of sugar (glucose) in your blood is too low.  Low blood sugar can happen to people who have diabetes and people who do not have diabetes. Low blood sugar can happen quickly, and it can be an emergency.  Make sure you  know the symptoms of low blood sugar and know how to treat it.  Always keep a source of sugar (fast-acting carb) with you to treat low blood sugar. This information is not intended to replace advice given to you by your health care provider. Make sure you discuss any questions you have with your health care provider. Document Revised: 05/21/2019 Document Reviewed: 05/21/2019 Elsevier Patient Education  2021 [employer].

## 2020-08-23 ENCOUNTER — Ambulatory Visit: Payer: Medicare Other | Admitting: Pharmacist

## 2020-08-23 MED FILL — DICLOFENAC SOD EC 75 MG TAB: 75 | 30 days supply | Qty: 60 | Fill #2

## 2020-08-24 ENCOUNTER — Encounter: Payer: Self-pay | Admitting: Pharmacist

## 2020-08-24 ENCOUNTER — Other Ambulatory Visit: Payer: Self-pay | Admitting: Family Medicine

## 2020-08-24 ENCOUNTER — Ambulatory Visit: Payer: Medicare Other | Attending: Family Medicine | Admitting: Pharmacist

## 2020-08-24 ENCOUNTER — Other Ambulatory Visit: Payer: Self-pay

## 2020-08-24 DIAGNOSIS — Z794 Long term (current) use of insulin: Secondary | ICD-10-CM

## 2020-08-24 DIAGNOSIS — E1142 Type 2 diabetes mellitus with diabetic polyneuropathy: Secondary | ICD-10-CM

## 2020-08-24 DIAGNOSIS — E782 Mixed hyperlipidemia: Secondary | ICD-10-CM

## 2020-08-24 LAB — GLUCOSE, POCT (MANUAL RESULT ENTRY): POC Glucose: 155 mg/dl — AB (ref 70–99)

## 2020-08-24 MED ORDER — ATORVASTATIN CALCIUM 40 MG PO TABS: 40.0000 mg | ORAL_TABLET | Freq: Every day | ORAL | 1 refills | Status: DC

## 2020-08-24 MED ORDER — LANTUS SOLOSTAR 100 UNIT/ML ~~LOC~~ SOPN: 20.0000 [IU] | PEN_INJECTOR | Freq: Every day | SUBCUTANEOUS | 6 refills | Status: DC

## 2020-08-24 MED FILL — ATORVASTATIN CALCIUM 40 MG: 40 | 90 days supply | Qty: 90 | Fill #0

## 2020-08-24 MED FILL — LANTUS SOLOSTAR 100 UNITS/M: 100 | 30 days supply | Qty: 6 | Fill #0

## 2020-08-24 NOTE — Progress Notes (Signed)
S:     No chief complaint on file.  Patient arrives well and in good spirits.  Presents for diabetes evaluation, education, and management. Patient was referred and last seen by Primary Care Provider, Dr. Margarita Rana, on 08/16/20. At that visit, patient endorsed self dosing insulin due to hypoglycemic events and limited dietary intake. Dr. Margarita Rana reduced insulin glargine to 10 units BID.   Today, patient reports appropriate adherence to his regimen. He does present with blood sugar log (see below). Reports continued hypoglycemic events; states that he has to carry orange juice with him when he is active to prevent him from feeling too dizzy and off balance.   Insurance coverage/medication affordability: Medicare - [employer]  Medication adherence reported.   Current diabetes medications include: metformin 1,500 mg in AM and 1000 mg in PM, glipizide 10 mg BID, insulin glargine 10 units BID Current hypertension medications include: losartan-hctz 100-25 mg, amlodipine 10 mg Current hyperlipidemia medications include: atorvastatin 40 mg  Patient reports hypoglycemic events.  Patient reported dietary habits: Eats 1-2 meals/day Often skips breakfast, however, did have a banana this morning. Typically has 1-2 meals/per, but occasionally misses dinner as well.   Patient-reported exercise habits: none reported   Patient reports nocturia (nighttime urination).  Patient denies neuropathy (nerve pain). Patient denies visual changes. Patient reports self foot exams.     O:  Physical Exam  POCT: 155  ROS   Lab Results  Component Value Date   HGBA1C 7.9 (A) 08/16/2020   There were no vitals filed for this visit.  Lipid Panel     Component Value Date/Time   CHOL 132 05/13/2020 1130   TRIG 60 05/13/2020 1130   HDL 42 05/13/2020 1130   CHOLHDL 3.1 05/13/2020 1130   CHOLHDL 2.8 02/29/2016 1018   VLDL 12 02/29/2016 1018   LDLCALC 77 05/13/2020 1130    Home AM Fasting Blood  Sugars: 2/15 55  2/14 59  2/6 111  2/5 161  2/4 146  2/3 79  2/2 130  2/1 126     Clinical Atherosclerotic Cardiovascular Disease (ASCVD): No  The 10-year ASCVD risk score Mikey Bussing DC Jr., et al., 2013) is: 38.8%   Values used to calculate the score:     Age: 70 years     Sex: Male     Is Non-Hispanic African American: Yes     Diabetic: Yes     Tobacco smoker: No     Systolic Blood Pressure: 235 mmHg     Is BP treated: Yes     HDL Cholesterol: 42 mg/dL     Total Cholesterol: 132 mg/dL    A/P: Diabetes longstanding, close to being controlled. (A1c goal <7.5; with most recent A1c of 7.9) Patient is able to verbalize appropriate hypoglycemia management plan. Medication adherence appears appropriate. Control is suboptimal due to limited dietary intake and hypoglycemic events/history. Due to continued hypoglycemia, will discontinue glipizide today and move glargine dosing to once daily in the morning. Will have close follow-up next week.  -Continued metformin 1,500 mg in AM and 1,000 mg in PM -Changed insulin glargine to 20 units once daily in AM -Stopped glipizide  -Extensively discussed pathophysiology of diabetes, recommended lifestyle interventions, dietary effects on blood sugar control -Counseled on s/sx of and management of hypoglycemia -Next A1C anticipated May 2022.   ASCVD risk - primary prevention in patient with diabetes. Last LDL is controlled. ASCVD risk score is >20%  - high intensity statin indicated. Aspirin is indicated.  -  Continued aspirin 81 mg  -Continued atorvastatin 40 mg.   Hypertension longstanding, currently controlled.  Blood pressure goal = 130/80 mmHg. Medication adherence appropriate.   -continued current regimen   Written patient instructions provided.  Total time in face to face counseling 15 minutes.   Follow up Pharmacist Clinic Visit next week.   Harriet Pho, PharmD PGY-1 [facility] Resident   08/24/2020 11:34 AM

## 2020-08-30 MED FILL — LOSARTAN-HCTZ 100-25 MG TAB: 100-25 | 90 days supply | Qty: 90 | Fill #1

## 2020-09-02 ENCOUNTER — Ambulatory Visit: Payer: Self-pay | Admitting: *Deleted

## 2020-09-03 ENCOUNTER — Encounter: Payer: Self-pay | Admitting: Pharmacist

## 2020-09-03 ENCOUNTER — Other Ambulatory Visit: Payer: Self-pay

## 2020-09-03 ENCOUNTER — Ambulatory Visit: Payer: Medicare Other | Attending: Family Medicine | Admitting: Pharmacist

## 2020-09-03 DIAGNOSIS — E1142 Type 2 diabetes mellitus with diabetic polyneuropathy: Secondary | ICD-10-CM

## 2020-09-03 DIAGNOSIS — Z794 Long term (current) use of insulin: Secondary | ICD-10-CM | POA: Diagnosis not present

## 2020-09-03 LAB — GLUCOSE, POCT (MANUAL RESULT ENTRY)
POC Glucose: 126 mg/dl — AB (ref 70–99)
POC Glucose: 65 mg/dl — AB (ref 70–99)

## 2020-09-03 NOTE — Progress Notes (Signed)
S:    PCP: Dr. Margarita Rana   No chief complaint on file.  Patient arrives well and in good spirits. Presents for diabetes evaluation, education, and management. Patient was referred and last seen by Primary Care Provider, Dr. Margarita Rana, on 08/16/20. At that visit, patient endorsed self dosing insulin due to hypoglycemic events and limited dietary intake. Dr. Margarita Rana reduced insulin glargine to 10 units BID. Clinical pharmacist saw the patient on 08/24/2020 for DM management; patient reported significant hypoglycemic events. Glipizide was discontinued and insulin 10 units BID was switched to 20 units once daily in AM.   Today, patient reports appropriate adherence to his new regimen. He presents with his blood sugar log (see below). Reports significant improvements with blood sugar. Denies any hypoglycemic events. He states "I feel so much better.. the dizziness and unstable feeling I was having when I would get up and walk... that is no more!". No longer has to carry around orange juice with him. Patient was extremely pleased with results. Patient denies any missed doses. Diet has remained the same, does not regularly eat breakfast. Discussed affordability of insulin - patient advised pricing is okay. I confirmed with pharmacy that his monthly copays are $35.00 total for all medications.   Insurance coverage/medication affordability: Medicare - [employer]  Medication adherence reported.   Current diabetes medications include: metformin 1,500 mg in AM and 1000 mg in PM, insulin glargine 20 units AM  Current hypertension medications include: losartan-hctz 100-25 mg, amlodipine 10 mg Current hyperlipidemia medications include: atorvastatin 40 mg  Patient denies hypoglycemic events.  Patient reported dietary habits: Eats 1-2 meals/day Often skips breakfast, however, did have a banana this morning. Typically has 1-2 meals/per, but occasionally misses dinner as well.   Patient-reported exercise  habits: none reported   Patient denies nocturia (nighttime urination).  Patient denies neuropathy (nerve pain). Patient denies visual changes. Patient reports self foot exams.     O:  Physical Exam  POCT: 65 - patient given soda in office. Of note, pt skipped breakfast this AM.  Repeat POCT: 126  ROS   Lab Results  Component Value Date   HGBA1C 7.9 (A) 08/16/2020   There were no vitals filed for this visit.  Lipid Panel     Component Value Date/Time   CHOL 132 05/13/2020 1130   TRIG 60 05/13/2020 1130   HDL 42 05/13/2020 1130   CHOLHDL 3.1 05/13/2020 1130   CHOLHDL 2.8 02/29/2016 1018   VLDL 12 02/29/2016 1018   LDLCALC 77 05/13/2020 1130    Home AM Fasting Blood Sugars: 2/14 59   2/15 55 *day of first pharmacy visit   2/16 59   2/17 97   2/18 145   2/19 168 *reported he ate in middle of the night   2/20 83   2/21 131   2/22 117   2/23 121   2/24 161 *reported he ate in middle of the night   2/25 86   Average morning BG since last visit: 123   Clinical Atherosclerotic Cardiovascular Disease (ASCVD): No  The 10-year ASCVD risk score Mikey Bussing DC Jr., et al., 2013) is: 38.8%   Values used to calculate the score:     Age: 70 years     Sex: Male     Is Non-Hispanic African American: Yes     Diabetic: Yes     Tobacco smoker: No     Systolic Blood Pressure: 532 mmHg     Is BP treated: Yes  HDL Cholesterol: 42 mg/dL     Total Cholesterol: 132 mg/dL    A/P: Diabetes longstanding, close to being controlled. (A1c goal <7.5; with most recent A1c of 7.9) Patient is able to verbalize appropriate hypoglycemia management plan. Medication adherence appears appropriate. Control is suboptimal due to limited dietary intake and history of hypoglycemic events. Patient's response to recent therapeutic changes have been great. Hypoglycemic events have resolved. On average, his morning blood sugars are within target goal. Provided the patient with Luke's card and direct  office phone number in case he experiences any changes/issues. Will follow up with patient in one month. -Continued metformin 1,500 mg in AM and 1,000 mg in PM -Continued insulin glargine 20 units once daily in AM -Extensively discussed pathophysiology of diabetes, recommended lifestyle interventions, dietary effects on blood sugar control -Counseled on s/sx of and management of hypoglycemia -Next A1C anticipated May 2022.   ASCVD risk - primary prevention in patient with diabetes. Last LDL is controlled. ASCVD risk score is >20%  - high intensity statin indicated. Aspirin is indicated.  -Continued aspirin 81 mg  -Continued atorvastatin 40 mg.   Hypertension longstanding, currently controlled.  Blood pressure goal = 130/80 mmHg. Medication adherence appropriate.   -continued current regimen   Written patient instructions provided.  Total time in face to face counseling 15 minutes.   Follow up Pharmacist Clinic Visit in one month. Patient seen by [facility].   Harriet Pho, PharmD PGY-1 Community Pharmacy Resident  09/03/2020 9:53 AM    Benard Halsted, PharmD, BCACP, [place] [phone]

## 2020-09-30 ENCOUNTER — Other Ambulatory Visit: Payer: Self-pay | Admitting: Family Medicine

## 2020-09-30 DIAGNOSIS — M19049 Primary osteoarthritis, unspecified hand: Secondary | ICD-10-CM

## 2020-09-30 MED FILL — DICLOFENAC SOD EC 75 MG TAB: 75 | 30 days supply | Qty: 60 | Fill #0

## 2020-09-30 MED FILL — TRUEplus 5-BEVEL PEN NEEDLE: 32G X 4 MM | 25 days supply | Qty: 100 | Fill #2

## 2020-10-01 ENCOUNTER — Encounter: Payer: Self-pay | Admitting: Pharmacist

## 2020-10-01 ENCOUNTER — Other Ambulatory Visit: Payer: Self-pay | Admitting: Family Medicine

## 2020-10-01 ENCOUNTER — Ambulatory Visit: Payer: Medicare Other | Attending: Family Medicine | Admitting: Pharmacist

## 2020-10-01 ENCOUNTER — Other Ambulatory Visit: Payer: Self-pay

## 2020-10-01 DIAGNOSIS — E1142 Type 2 diabetes mellitus with diabetic polyneuropathy: Secondary | ICD-10-CM

## 2020-10-01 DIAGNOSIS — R21 Rash and other nonspecific skin eruption: Secondary | ICD-10-CM | POA: Diagnosis not present

## 2020-10-01 DIAGNOSIS — Z794 Long term (current) use of insulin: Secondary | ICD-10-CM

## 2020-10-01 LAB — GLUCOSE, POCT (MANUAL RESULT ENTRY): POC Glucose: 127 mg/dl — AB (ref 70–99)

## 2020-10-01 MED ORDER — BETAMETHASONE DIPROPIONATE 0.05 % EX CREA: TOPICAL_CREAM | Freq: Two times a day (BID) | CUTANEOUS | 0 refills | Status: DC

## 2020-10-01 NOTE — Progress Notes (Signed)
S:    PCP: Dr. Margarita Rana   No chief complaint on file.  Patient arrives well and in good spirits. Presents for diabetes evaluation, education, and management. Patient was referred and last seen by Primary Care Provider, Dr. Margarita Rana, on 08/16/20. We've seen him a couple of times since, most recently on 09/03/2020. We stopped his glipizide due to hypoglycemia.   Insurance coverage/medication affordability: Medicare - [employer]  Medication adherence reported.   Current diabetes medications include: metformin 1,500 mg in AM and 1000 mg in PM, insulin glargine 20 units AM  Current hypertension medications include: losartan-hctz 100-25 mg, amlodipine 10 mg Current hyperlipidemia medications include: atorvastatin 40 mg  Patient denies hypoglycemic events.  Patient reported dietary habits: Eats 1-2 meals/day Often skips breakfast, however, did have a banana this morning. Typically has 1-2 meals/per, but occasionally misses dinner as well.   Patient-reported exercise habits: none reported   Patient denies nocturia (nighttime urination).  Patient denies neuropathy (nerve pain). Patient denies visual changes. Patient reports self foot exams.     O:  Physical Exam  POCT: 127  ROS  Lab Results  Component Value Date   HGBA1C 7.9 (A) 08/16/2020   There were no vitals filed for this visit.  Lipid Panel     Component Value Date/Time   CHOL 132 05/13/2020 1130   TRIG 60 05/13/2020 1130   HDL 42 05/13/2020 1130   CHOLHDL 3.1 05/13/2020 1130   CHOLHDL 2.8 02/29/2016 1018   VLDL 12 02/29/2016 1018   LDLCALC 77 05/13/2020 1130    Home AM Fasting Blood Sugars: 2/28 97   3/1 111   3/2 82   3/3 104   3/4 114   3/5 88   3/6 83   3/7 98   3/8 97   3/9 111   3/10 115   3/11 104    3/12: 185  (post-prandial) 3/13: 120  3/14: 146 (post-prandial) 3/16: 127  3/17: 123 3/18: 65  3/19: 77  3/20: 140 3/21: 136 3/22: 185 (post-prandial) 3/23: 110 3/24:101 3/25:  141  Average morning BG since last visit: 114   Clinical Atherosclerotic Cardiovascular Disease (ASCVD): No  The 10-year ASCVD risk score Mikey Bussing DC Jr., et al., 2013) is: 38.8%   Values used to calculate the score:     Age: 70 years     Sex: Male     Is Non-Hispanic African American: Yes     Diabetic: Yes     Tobacco smoker: No     Systolic Blood Pressure: 517 mmHg     Is BP treated: Yes     HDL Cholesterol: 42 mg/dL     Total Cholesterol: 132 mg/dL    A/P: Diabetes longstanding, close to being controlled. (A1c goal <7.5; with most recent A1c of 7.9) Patient is able to verbalize appropriate hypoglycemia management plan. Medication adherence appears appropriate. Control is suboptimal due to limited dietary intake and history of hypoglycemic events. -Continued metformin 1,500 mg in AM and 1,000 mg in PM -Continued insulin glargine 20 units once daily in AM -Extensively discussed pathophysiology of diabetes, recommended lifestyle interventions, dietary effects on blood sugar control -Counseled on s/sx of and management of hypoglycemia -Next A1C anticipated May 2022.   ASCVD risk - primary prevention in patient with diabetes. Last LDL is controlled. ASCVD risk score is >20%  - high intensity statin indicated. Aspirin is indicated.  -Continued aspirin 81 mg  -Continued atorvastatin 40 mg.   Hypertension longstanding, currently controlled.  Blood pressure goal = 130/80 mmHg. Medication adherence appropriate.   -continued current regimen   Written patient instructions provided.  Total time in face to face counseling 15 minutes.   Follow up w/ PCP.  Benard Halsted, PharmD, [place], [place] [phone]

## 2020-10-09 ENCOUNTER — Other Ambulatory Visit: Payer: Self-pay

## 2020-10-12 ENCOUNTER — Other Ambulatory Visit: Payer: Self-pay

## 2020-10-12 MED FILL — Betamethasone Dipropionate Cream 0.05%: CUTANEOUS | 15 days supply | Qty: 30 | Fill #0 | Status: CN

## 2020-10-18 ENCOUNTER — Other Ambulatory Visit: Payer: Self-pay | Admitting: Registered Nurse

## 2020-10-18 DIAGNOSIS — R21 Rash and other nonspecific skin eruption: Secondary | ICD-10-CM

## 2020-10-25 ENCOUNTER — Other Ambulatory Visit: Payer: Self-pay

## 2020-10-25 ENCOUNTER — Other Ambulatory Visit: Payer: Self-pay | Admitting: Family Medicine

## 2020-10-25 DIAGNOSIS — H40023 Open angle with borderline findings, high risk, bilateral: Secondary | ICD-10-CM | POA: Diagnosis not present

## 2020-10-25 DIAGNOSIS — H1045 Other chronic allergic conjunctivitis: Secondary | ICD-10-CM | POA: Diagnosis not present

## 2020-10-25 DIAGNOSIS — E119 Type 2 diabetes mellitus without complications: Secondary | ICD-10-CM | POA: Diagnosis not present

## 2020-10-25 DIAGNOSIS — H25813 Combined forms of age-related cataract, bilateral: Secondary | ICD-10-CM | POA: Diagnosis not present

## 2020-10-25 DIAGNOSIS — I10 Essential (primary) hypertension: Secondary | ICD-10-CM

## 2020-10-25 LAB — HM DIABETES EYE EXAM

## 2020-10-25 MED FILL — Metformin HCl Tab 1000 MG: ORAL | 90 days supply | Qty: 225 | Fill #0 | Status: AC

## 2020-10-25 MED FILL — Diclofenac Sodium Tab Delayed Release 75 MG: ORAL | 30 days supply | Qty: 60 | Fill #0 | Status: AC

## 2020-10-25 MED FILL — Insulin Glargine Soln Pen-Injector 100 Unit/ML: SUBCUTANEOUS | 30 days supply | Qty: 6 | Fill #0 | Status: AC

## 2020-10-26 ENCOUNTER — Other Ambulatory Visit: Payer: Self-pay

## 2020-10-26 MED ORDER — AMLODIPINE BESYLATE 10 MG PO TABS
10.0000 mg | ORAL_TABLET | Freq: Every day | ORAL | 2 refills | Status: DC
  Filled 2020-10-26: qty 30, 30d supply, fill #0
  Filled 2020-11-16 – 2021-06-13 (×2): qty 30, 30d supply, fill #1
  Filled 2021-07-07: qty 30, 30d supply, fill #2

## 2020-10-27 ENCOUNTER — Other Ambulatory Visit: Payer: Self-pay

## 2020-11-08 ENCOUNTER — Other Ambulatory Visit: Payer: Self-pay | Admitting: *Deleted

## 2020-11-08 NOTE — Patient Outreach (Signed)
[facility] [facility]) Care Management  11/08/2020  Jerry Serrano 16-May-1951 [id number]   RN Health Coach attempted follow up outreach call to patient.  Patient was unavailable. HIPPA compliance voicemail message left with return callback number.  Plan: RN will call patient again within 30 days.  [facility] [facility] [phone]

## 2020-11-10 ENCOUNTER — Other Ambulatory Visit: Payer: Self-pay | Admitting: *Deleted

## 2020-11-10 NOTE — Patient Outreach (Signed)
[place] [facility]) Care Management  [place]  11/10/2020   Jerry Serrano 1950-12-30 [id number] RN Health Coach received return telephone call from patient.  Hipaa compliance verified. Per patient his blood sugar was 154. Patient has been having episodes of hypoglycemia at work. Per patient he feels dizzy. Patient had not been checking his blood sugar in the morning. He checks it after going to work .  RN discussed foods to eat for breakfast. Patient has agreed to follow up outreach calls.   Encounter Medications:  Outpatient Encounter Medications as of 11/10/2020  Medication Sig  . amLODipine (NORVASC) 10 MG tablet Take 1 tablet (10 mg total) by mouth daily.  Marland Kitchen aspirin EC 81 MG tablet Take 1 tablet (81 mg total) by mouth daily.  Marland Kitchen atorvastatin (LIPITOR) 40 MG tablet TAKE 1 TABLET (40 MG TOTAL) BY MOUTH DAILY.  Marland Kitchen betamethasone dipropionate 0.05 % cream APPLY EXTERNALLY TO THE AFFECTED AREA TWICE DAILY  . Blood Glucose Monitoring Suppl (ACCU-CHEK AVIVA PLUS) w/Device KIT USE AS DIRECTED 4 TIMES DAILY AFTER MEALS AND AT BEDTIME.  Marland Kitchen Blood Glucose Monitoring Suppl (ACCU-CHEK GUIDE ME) w/Device KIT USE AS DIRECTED 4 TIMES DAILY AFTER MEALS AND AT BEDTIME.  Marland Kitchen diclofenac (VOLTAREN) 75 MG EC tablet TAKE 1 TABLET (75 MG TOTAL) BY MOUTH 2 (TWO) TIMES DAILY.  Marland Kitchen gabapentin (NEURONTIN) 300 MG capsule Take 1 capsule (300 mg total) by mouth daily.  Marland Kitchen glucose blood test strip Use to check blood sugar TID. E11.42  . insulin glargine (LANTUS) 100 UNIT/ML Solostar Pen INJECT 20 UNITS INTO THE SKIN DAILY.  Marland Kitchen Insulin Pen Needle 32G X 4 MM MISC USE AS INSTRUCTED TO INJECT INSULIN DAILY.  Marland Kitchen ketoconazole (NIZORAL) 2 % cream Apply 1 application topically daily.  Elmore Guise Devices (ACCU-CHEK SOFTCLIX) lancets Use as instructed  . losartan-hydrochlorothiazide (HYZAAR) 100-25 MG tablet TAKE 1 TABLET BY MOUTH DAILY. *DISCONTINUE LOSARTAN 50/12.5*  . metFORMIN (GLUCOPHAGE) 1000 MG tablet TAKE 1 AND 1/2  TABLETS ($RemoveB'1500MG'UaMVZMhP$ ) BY MOUTH IN THE MORNING AND 1 TABLET ($RemoveB'1000MG'Gxapvrvn$ ) IN THE EVENING.  Charlott Holler LANCETS MISC Test 3 times per day  . olopatadine (PATANOL) 0.1 % ophthalmic solution Place 1 drop into both eyes 2 (two) times daily.  . pantoprazole (PROTONIX) 40 MG tablet TAKE 1 TABLET BY MOUTH DAILY. (Patient not taking: Reported on 08/16/2020)  . sildenafil (VIAGRA) 50 MG tablet Take 1 tablet (50 mg total) by mouth daily as needed for erectile dysfunction. (Patient not taking: Reported on 08/16/2020)  . [DISCONTINUED] glipiZIDE (GLIPIZIDE XL) 10 MG 24 hr tablet TAKE 2 TABLETS (20 MG TOTAL) BY MOUTH DAILY WITH BREAKFAST.   Facility-Administered Encounter Medications as of 11/10/2020  Medication  . 0.9 %  sodium chloride infusion    Functional Status:  No flowsheet data found.  Fall/Depression Screening: Fall Risk  11/10/2020 08/16/2020 08/11/2020  Falls in the past year? 0 0 0  Number falls in past yr: 0 0 0  Injury with Fall? 0 0 0  Risk for fall due to : Impaired vision - Impaired vision  Follow up Falls evaluation completed - -   PHQ 2/9 Scores 08/16/2020 07/18/2019 07/18/2019 02/06/2019 01/07/2019 01/07/2019 09/24/2018  PHQ - 2 Score 0 0 0 0 0 0 0  PHQ- 9 Score 0 - - - - 0 1    Assessment:  Goals Addressed            This Visit's Progress   . ((THN)Obtain Eye Exam-Diabetes Type 2   On  track    Timeframe:  Long-Range Goal Priority:  Medium Start Date:      18984210                       Expected End Date:    31281188               Follow Up Date [id number]   - schedule appointment with eye doctor    Why is this important?    Eye check-ups are important when you have diabetes.   Vision loss can be prevented.    Notes:  Last eye 04/05/2020    . (THN)Monitor and Manage My Blood Sugar-Diabetes Type 2   Not on track    Timeframe:  Long-Range Goal Priority:  High Start Date:   [id number]                          Expected End Date:      61518343                 Follow Up Date [id number]   -  check blood sugar at prescribed times - check blood sugar if I feel it is too high or too low - enter blood sugar readings and medication or insulin into daily log - take the blood sugar log to all doctor visits - take the blood sugar meter to all doctor visits    Why is this important?    Checking your blood sugar at home helps to keep it from getting very high or very low.   Writing the results in a diary or log helps the doctor know how to care for you.   Your blood sugar log should have the time, date and the results.   Also, write down the amount of insulin or other medicine that you take.   Other information, like what you ate, exercise done and how you were feeling, will also be helpful.     Notes:  [id number]/ Patient is not monitoring his blood sugars in the am. He is having episodes of hypoglycemia. Dizziness sometimes at work.     . (THN)Perform Foot Care-Diabetes Type 2   On track    Timeframe:  Long-Range Goal Priority:  Medium Start Date: [id number]                            Expected End Date:    19597471             Follow Up Date 85501586   - check feet daily for cuts, sores or redness - trim toenails straight across - wash and dry feet carefully every day - wear comfortable, cotton socks - wear comfortable, well-fitting shoes    Why is this important?    Good foot care is very important when you have diabetes.   There are many things you can do to keep your feet healthy and catch a problem early.    Notes:     . (THN)Set My Target A1C-Diabetes Type 2   On track    Timeframe:  Long-Range Goal Priority:  High Start Date:    82574935                        Expected End Date:  52174715  Follow Up Date 01237990   - set target A1C    Why is this important?    Your target A1C is decided together by you and your doctor.   It is based on several things like your age and other health issues.    Note 7.0 goal A1C is 8.1  [id number] A1C  is 7.9       Plan:  Follow-up:  Patient agrees to Care Plan and Follow-up. RN ordered free COVID kits for patient RN discussed eating breakfast Patient will monitor blood sugars in am and then eat breakfast RN sent update assessment to PCP RN will follow up within the month of August  Allan Bacigalupi [place] Management [phone]

## 2020-11-10 NOTE — Patient Instructions (Signed)
Goals Addressed            This Visit's Progress   . ((THN)Obtain Eye Exam-Diabetes Type 2   On track    Timeframe:  Long-Range Goal Priority:  Medium Start Date:      [id number]                       Expected End Date:    84166063               Follow Up Date 01601093   - schedule appointment with eye doctor    Why is this important?    Eye check-ups are important when you have diabetes.   Vision loss can be prevented.    Notes:  Last eye 04/05/2020    . (THN)Monitor and Manage My Blood Sugar-Diabetes Type 2   Not on track    Timeframe:  Long-Range Goal Priority:  High Start Date:   23557322                          Expected End Date:      02542706                 Follow Up Date 2376283   - check blood sugar at prescribed times - check blood sugar if I feel it is too high or too low - enter blood sugar readings and medication or insulin into daily log - take the blood sugar log to all doctor visits - take the blood sugar meter to all doctor visits    Why is this important?    Checking your blood sugar at home helps to keep it from getting very high or very low.   Writing the results in a diary or log helps the doctor know how to care for you.   Your blood sugar log should have the time, date and the results.   Also, write down the amount of insulin or other medicine that you take.   Other information, like what you ate, exercise done and how you were feeling, will also be helpful.     Notes:  15176160/ Patient is not monitoring his blood sugars in the am. He is having episodes of hypoglycemia. Dizziness sometimes at work.     . (THN)Perform Foot Care-Diabetes Type 2   On track    Timeframe:  Long-Range Goal Priority:  Medium Start Date: 73710626                            Expected End Date:    94854627             Follow Up Date 03500938   - check feet daily for cuts, sores or redness - trim toenails straight across - wash and dry feet carefully every  day - wear comfortable, cotton socks - wear comfortable, well-fitting shoes    Why is this important?    Good foot care is very important when you have diabetes.   There are many things you can do to keep your feet healthy and catch a problem early.    Notes:     . (THN)Set My Target A1C-Diabetes Type 2   On track    Timeframe:  Long-Range Goal Priority:  High Start Date:    18299371  Expected End Date:  16109604                     Follow Up Date 54098119   - set target A1C    Why is this important?    Your target A1C is decided together by you and your doctor.   It is based on several things like your age and other health issues.    Note 7.0 goal A1C is 8.1  14782956 A1C is 7.9

## 2020-11-16 ENCOUNTER — Other Ambulatory Visit: Payer: Self-pay

## 2020-11-16 ENCOUNTER — Encounter: Payer: Self-pay | Admitting: Family Medicine

## 2020-11-16 ENCOUNTER — Other Ambulatory Visit: Payer: Self-pay | Admitting: Pharmacist

## 2020-11-16 ENCOUNTER — Ambulatory Visit: Payer: Medicare Other | Attending: Family Medicine | Admitting: Family Medicine

## 2020-11-16 VITALS — BP 143/74 | HR 88 | Ht 64.0 in | Wt 145.0 lb

## 2020-11-16 DIAGNOSIS — E785 Hyperlipidemia, unspecified: Secondary | ICD-10-CM | POA: Diagnosis not present

## 2020-11-16 DIAGNOSIS — Z794 Long term (current) use of insulin: Secondary | ICD-10-CM

## 2020-11-16 DIAGNOSIS — E1142 Type 2 diabetes mellitus with diabetic polyneuropathy: Secondary | ICD-10-CM | POA: Diagnosis not present

## 2020-11-16 DIAGNOSIS — E1169 Type 2 diabetes mellitus with other specified complication: Secondary | ICD-10-CM

## 2020-11-16 DIAGNOSIS — I152 Hypertension secondary to endocrine disorders: Secondary | ICD-10-CM

## 2020-11-16 DIAGNOSIS — E1165 Type 2 diabetes mellitus with hyperglycemia: Secondary | ICD-10-CM

## 2020-11-16 LAB — POCT GLYCOSYLATED HEMOGLOBIN (HGB A1C): HbA1c, POC (controlled diabetic range): 9.1 % — AB (ref 0.0–7.0)

## 2020-11-16 LAB — GLUCOSE, POCT (MANUAL RESULT ENTRY): POC Glucose: 147 mg/dl — AB (ref 70–99)

## 2020-11-16 MED ORDER — LOSARTAN POTASSIUM-HCTZ 100-25 MG PO TABS
ORAL_TABLET | ORAL | 1 refills | Status: DC
  Filled 2020-11-16: qty 90, 90d supply, fill #0
  Filled 2021-03-09: qty 90, 90d supply, fill #1

## 2020-11-16 MED ORDER — GLIPIZIDE 5 MG PO TABS
5.0000 mg | ORAL_TABLET | Freq: Two times a day (BID) | ORAL | 3 refills | Status: DC
Start: 2020-11-16 — End: 2021-02-14
  Filled 2020-11-16: qty 60, 30d supply, fill #0
  Filled 2020-12-14: qty 60, 30d supply, fill #1
  Filled 2021-01-17: qty 60, 30d supply, fill #2

## 2020-11-16 MED ORDER — METFORMIN HCL 1000 MG PO TABS
ORAL_TABLET | ORAL | 1 refills | Status: DC
  Filled 2020-11-16: qty 225, fill #0
  Filled 2021-01-24: qty 225, 90d supply, fill #0
  Filled 2021-05-08: qty 225, 90d supply, fill #1

## 2020-11-16 MED FILL — Atorvastatin Calcium Tab 40 MG (Base Equivalent): ORAL | 30 days supply | Qty: 30 | Fill #0 | Status: AC

## 2020-11-16 NOTE — Progress Notes (Signed)
Established Patient Office Visit  Subjective:  Patient ID: Jerry Serrano, male    DOB: [birth year]  Age: 70 y.o. MRN: [id number]  CC:  Chief Complaint  Patient presents with  . Diabetes    HPI Jerry Serrano is a32 year old male with history of hypertension, hyperlipidemia, type 2 diabetes mellitus (A1c 9.1) who presents today forafollow-up visit. In the interim, he was experiencing hypoglycemic episodes and was taken off glipizide.  The patient reports complete medication compliance. His last A1c was 7.9 in Feb, 2021 and today was 9.1 with a POCT glucose of 147. He was very pleased that he has not been having low blood sugars recently, but has noticed higher numbers now. He reports his fasting sugars have been between 100-150 with the highest sugar being 240. He has not had any dizziness or feelings of syncope and says he carries orange juice with him in case he gets low blood sugars. He denies any numbness/tingling in hands or feet as well as vision changes, nausea, diarrhea or constipation.   His blood pressure today was 143/74, but says he rushed into the office this morning. He says he has been taking all of his blood pressure medications.   He has no acute concerns today.     Past Medical History:  Diagnosis Date  . Allergy   . Arthritis   . Diabetes mellitus   . Hyperlipidemia   . Hypertension     Past Surgical History:  Procedure Laterality Date  . HERNIA REPAIR      Family History  Problem Relation Age of Onset  . Colon cancer Neg Hx   . Esophageal cancer Neg Hx   . Rectal cancer Neg Hx   . Stomach cancer Neg Hx     Social History   Socioeconomic History  . Marital status: Married    Spouse name: Not on file  . Number of children: Not on file  . Years of education: Not on file  . Highest education level: Not on file  Occupational History  . Not on file  Tobacco Use  . Smoking status: Former Smoker    Types: Cigarettes  . Smokeless tobacco:  Never Used  . Tobacco comment: over 30 years ago  Vaping Use  . Vaping Use: Never used  Substance and Sexual Activity  . Alcohol use: No  . Drug use: No  . Sexual activity: Not on file  Other Topics Concern  . Not on file  Social History Narrative  . Not on file   Social Determinants of Health   Financial Resource Strain: Not on file  Food Insecurity: No Food Insecurity  . Worried About Charity fundraiser in the Last Year: Never true  . Ran Out of Food in the Last Year: Never true  Transportation Needs: No Transportation Needs  . Lack of Transportation (Medical): No  . Lack of Transportation (Non-Medical): No  Physical Activity: Not on file  Stress: Not on file  Social Connections: Not on file  Intimate Partner Violence: Not on file    Outpatient Medications Prior to Visit  Medication Sig Dispense Refill  . amLODipine (NORVASC) 10 MG tablet Take 1 tablet (10 mg total) by mouth daily. 30 tablet 2  . aspirin EC 81 MG tablet Take 1 tablet (81 mg total) by mouth daily. 30 tablet 3  . atorvastatin (LIPITOR) 40 MG tablet TAKE 1 TABLET (40 MG TOTAL) BY MOUTH DAILY. 90 tablet 1  . betamethasone dipropionate 0.05 % cream  APPLY EXTERNALLY TO THE AFFECTED AREA TWICE DAILY 30 g 0  . Blood Glucose Monitoring Suppl (ACCU-CHEK AVIVA PLUS) w/Device KIT USE AS DIRECTED 4 TIMES DAILY AFTER MEALS AND AT BEDTIME. 1 kit 0  . Blood Glucose Monitoring Suppl (ACCU-CHEK GUIDE ME) w/Device KIT USE AS DIRECTED 4 TIMES DAILY AFTER MEALS AND AT BEDTIME. 1 kit 0  . diclofenac (VOLTAREN) 75 MG EC tablet TAKE 1 TABLET (75 MG TOTAL) BY MOUTH 2 (TWO) TIMES DAILY. 60 tablet 2  . gabapentin (NEURONTIN) 300 MG capsule Take 1 capsule (300 mg total) by mouth daily. 30 capsule 6  . glucose blood test strip Use to check blood sugar TID. E11.42 100 each 2  . insulin glargine (LANTUS) 100 UNIT/ML Solostar Pen INJECT 20 UNITS INTO THE SKIN DAILY. 30 mL 6  . Insulin Pen Needle 32G X 4 MM MISC USE AS INSTRUCTED TO INJECT  INSULIN DAILY. 100 each 2  . ketoconazole (NIZORAL) 2 % cream Apply 1 application topically daily. 15 g 0  . Lancet Devices (ACCU-CHEK SOFTCLIX) lancets Use as instructed 1 each 12  . MICROLET LANCETS MISC Test 3 times per day 100 each 12  . olopatadine (PATANOL) 0.1 % ophthalmic solution Place 1 drop into both eyes 2 (two) times daily.    Marland Kitchen losartan-hydrochlorothiazide (HYZAAR) 100-25 MG tablet TAKE 1 TABLET BY MOUTH DAILY. *DISCONTINUE LOSARTAN 50/12.5* 90 tablet 1  . metFORMIN (GLUCOPHAGE) 1000 MG tablet TAKE 1 AND 1/2 TABLETS ($RemoveBefo'1500MG'[id number]$ ) BY MOUTH IN THE MORNING AND 1 TABLET ($RemoveB'1000MG'xxWopKFf$ ) IN THE EVENING. 225 tablet 1  . pantoprazole (PROTONIX) 40 MG tablet TAKE 1 TABLET BY MOUTH DAILY. (Patient not taking: No sig reported) 30 tablet 5  . sildenafil (VIAGRA) 50 MG tablet Take 1 tablet (50 mg total) by mouth daily as needed for erectile dysfunction. (Patient not taking: No sig reported) 10 tablet 1   Facility-Administered Medications Prior to Visit  Medication Dose Route Frequency Provider Last Rate Last Admin  . 0.9 %  sodium chloride infusion  500 mL Intravenous Continuous Danis, Estill Cotta III, MD        No Known Allergies  ROS Review of Systems Constitutional: Negative for activity change and appetite change.  HENT: Negative for sinus pressure and sore throat.   Eyes: Negative for visual disturbance.  Respiratory: Negative for cough, chest tightness and shortness of breath.   Cardiovascular: Negative for chest pain and leg swelling.  Gastrointestinal: Negative for abdominal distention, abdominal pain, constipation and diarrhea.  Endocrine: Negative.   Genitourinary: Negative for dysuria.  Musculoskeletal: Negative for joint swelling and myalgias.  Skin: Negative for rash.  Allergic/Immunologic: Negative.   Neurological: Negative for weakness, light-headedness and numbness.  Psychiatric/Behavioral: Negative for dysphoric mood and suicidal ideas.   Objective:    Physical Exam   Constitutional:      Appearance: He is well-developed.  Neck:     Vascular: No JVD.  Cardiovascular:     Rate and Rhythm: Normal rate.     Heart sounds: Normal heart sounds. No murmur heard.   Pulmonary:     Effort: Pulmonary effort is normal.     Breath sounds: Normal breath sounds. No wheezing or rales.  Chest:     Chest wall: No tenderness.  Abdominal:     General: Bowel sounds are normal. There is no distension.     Palpations: Abdomen is soft. There is no mass.     Tenderness: There is no abdominal tenderness.  Musculoskeletal:  General: Normal range of motion.     Right lower leg: No edema.     Left lower leg: No edema.  Neurological:     Mental Status: He is alert and oriented to person, place, and time.  Psychiatric:        Mood and Affect: Mood normal.   BP (!) 143/74   Pulse 88   Ht 5\' 4"  (1.626 m)   Wt 145 lb (65.8 kg)   SpO2 99%   BMI 24.89 kg/m  Wt Readings from Last 3 Encounters:  11/16/20 145 lb (65.8 kg)  08/16/20 145 lb 12.8 oz (66.1 kg)  05/13/20 152 lb (68.9 kg)     Health Maintenance Due  Topic Date Due  . COVID-19 Vaccine (3 - Booster for Pfizer series) 04/08/2020    There are no preventive care reminders to display for this patient.  Lab Results  Component Value Date   TSH 1.500 07/18/2019   Lab Results  Component Value Date   WBC 6.1 07/18/2019   HGB 12.6 (L) 07/18/2019   HCT 37.6 07/18/2019   MCV 80 07/18/2019   PLT 368 07/18/2019   Lab Results  Component Value Date   NA 138 05/13/2020   K 4.7 05/13/2020   CO2 25 05/13/2020   GLUCOSE 131 (H) 05/13/2020   BUN 17 05/13/2020   CREATININE 1.04 05/13/2020   BILITOT 0.5 05/13/2020   ALKPHOS 83 05/13/2020   AST 25 05/13/2020   ALT 24 05/13/2020   PROT 7.8 05/13/2020   ALBUMIN 5.2 (H) 05/13/2020   CALCIUM 10.2 05/13/2020   Lab Results  Component Value Date   CHOL 132 05/13/2020   Lab Results  Component Value Date   HDL 42 05/13/2020   Lab Results  Component  Value Date   LDLCALC 77 05/13/2020   Lab Results  Component Value Date   TRIG 60 05/13/2020   Lab Results  Component Value Date   CHOLHDL 3.1 05/13/2020   Lab Results  Component Value Date   HGBA1C 9.1 (A) 11/16/2020      Assessment & Plan:   Problem List Items Addressed This Visit      Cardiovascular and Mediastinum   Essential hypertension Systolic pressure was moderately elevated above target goal of 130/80. Will recheck at next visit and assess whether medication changes are needed.  Counseled on low-sodium, DASH diet.    Relevant Medications   losartan-hydrochlorothiazide (HYZAAR) 100-25 MG tablet     Endocrine   Type 2 diabetes mellitus (HCC) - Primary Uncontrolled with an A1c of 9.1. Hypoglycemic episodes have resolved.   Patient was taken off 20 mg of glipizide due to hypoglycemic episodes, but now is experiencing higher blood sugars.  Due to financial constraints, SGLT-2 or GLP-1's were not accessible. Will put the patient back on glipizide but only 5 mg twice per day.  Counseled patient to contact the office if he begins having low sugar episodes again.  Neuropathy is controlled. Counseled on Diabetic diet, my plate method, 01/16/2021 minutes of moderate intensity exercise/week Blood sugar logs with fasting goals of 80-120 mg/dl, random of less than 072 and in the event of sugars less than 60 mg/dl or greater than 895 mg/dl encouraged to notify the clinic. Advised on the need for annual eye exams, annual foot exams, Pneumonia vaccine.   Relevant Medications   glipiZIDE (GLUCOTROL) 5 MG tablet   metFORMIN (GLUCOPHAGE) 1000 MG tablet   losartan-hydrochlorothiazide (HYZAAR) 100-25 MG tablet   Other Relevant Orders  POCT glucose (manual entry) (Completed)   POCT glycosylated hemoglobin (Hb A1C) (Completed)   CMP14+EGFR   Lipid panel  Mixed hyperlipidemia Controlled Low-cholesterol diet - atorvastatin (LIPITOR) 40 MG tablet; Take 1 tablet (40 mg total) by mouth  daily.  Dispense: 90 tablet; Refill: 1      Meds ordered this encounter  Medications  . glipiZIDE (GLUCOTROL) 5 MG tablet    Sig: Take 1 tablet (5 mg total) by mouth 2 (two) times daily before a meal.    Dispense:  60 tablet    Refill:  3  . metFORMIN (GLUCOPHAGE) 1000 MG tablet    Sig: TAKE 1 AND 1/2 TABLETS ($RemoveBefo'1500MG'[id number]$ ) BY MOUTH IN THE MORNING AND 1 TABLET ($RemoveB'1000MG'vJTBjQcF$ ) IN THE EVENING.    Dispense:  225 tablet    Refill:  1  . losartan-hydrochlorothiazide (HYZAAR) 100-25 MG tablet    Sig: TAKE 1 TABLET BY MOUTH DAILY. *DISCONTINUE LOSARTAN 50/12.5*    Dispense:  90 tablet    Refill:  1    Follow-up: Return in about 3 months (around 02/16/2021) for Medical conditions.    Vance Peper, Medical Student   Evaluation and management procedures were performed by me with Medical Student in attendance, note written by Medical Student under my supervision and collaboration. I have reviewed the note and I agree with the management and plan.  Jerry Serrano's Diabetes is currently uncontrolled.  He had been followed by the clinical pharmacist due to hypoglycemic episodes and was subsequently taken off glipizide.  Due to a fixed income he has challenges with obtaining brand-name medications and Farxiga and SGLT2 will be difficult for him to afford.  In the past he has been noncompliant with Lantus due to cost.  Will resume glipizide at lower dose (of note he was previously on 20 mg extended release when he had hypoglycemia).  Due to his age and current constraints glycemic control will be leg strict with an acceptable A1c of 7.5-8.0.  Charlott Rakes, MD, FAAFP. [facility] and [place], [place]   11/16/2020, 2:07 PM

## 2020-11-16 NOTE — Patient Instructions (Signed)
Diabetes Mellitus and Exercise Exercising regularly is important for overall health, especially for people who have diabetes mellitus. Exercising is not only about losing weight. It has many other health benefits, such as increasing muscle strength and bone density and reducing body fat and stress. This leads to improved fitness, flexibility, and endurance, all of which result in better overall health. What are the benefits of exercise if I have diabetes? Exercise has many benefits for people with diabetes. They include:  Helping to lower and control blood sugar (glucose).  Helping the body to respond better to the hormone insulin by improving insulin sensitivity.  Reducing how much insulin the body needs.  Lowering the risk for heart disease by: ? Lowering "bad" cholesterol and triglyceride levels. ? Increasing "good" cholesterol levels. ? Lowering blood pressure. ? Lowering blood glucose levels. What is my activity plan? Your health care provider or certified diabetes educator can help you make a plan for the type and frequency of exercise that works for you. This is called your activity plan. Be sure to:  Get at least 150 minutes of medium-intensity or high-intensity exercise each week. Exercises may include brisk walking, biking, or water aerobics.  Do stretching and strengthening exercises, such as yoga or weight lifting, at least 2 times a week.  Spread out your activity over at least 3 days of the week.  Get some form of physical activity each day. ? Do not go more than 2 days in a row without some kind of physical activity. ? Avoid being inactive for more than 90 minutes at a time. Take frequent breaks to walk or stretch.  Choose exercises or activities that you enjoy. Set realistic goals.  Start slowly and gradually increase your exercise intensity over time.   How do I manage my diabetes during exercise? Monitor your blood glucose  Check your blood glucose before and  after exercising. If your blood glucose is: ? 240 mg/dL (13.3 mmol/L) or higher before you exercise, check your urine for ketones. These are chemicals created by the liver. If you have ketones in your urine, do not exercise until your blood glucose returns to normal. ? 100 mg/dL (5.6 mmol/L) or lower, eat a snack containing 15-20 grams of carbohydrate. Check your blood glucose 15 minutes after the snack to make sure that your glucose level is above 100 mg/dL (5.6 mmol/L) before you start your exercise.  Know the symptoms of low blood glucose (hypoglycemia) and how to treat it. Your risk for hypoglycemia increases during and after exercise. Follow these tips and your health care provider's instructions  Keep a carbohydrate snack that is fast-acting for use before, during, and after exercise to help prevent or treat hypoglycemia.  Avoid injecting insulin into areas of the body that are going to be exercised. For example, avoid injecting insulin into: ? Your arms, when you are about to play tennis. ? Your legs, when you are about to go jogging.  Keep records of your exercise habits. Doing this can help you and your health care provider adjust your diabetes management plan as needed. Write down: ? Food that you eat before and after you exercise. ? Blood glucose levels before and after you exercise. ? The type and amount of exercise you have done.  Work with your health care provider when you start a new exercise or activity. He or she may need to: ? Make sure that the activity is safe for you. ? Adjust your insulin, other medicines, and food that   you eat.  Drink plenty of water while you exercise. This prevents loss of water (dehydration) and problems caused by a lot of heat in the body (heat stroke).   Where to find more information  American Diabetes Association: www.diabetes.org Summary  Exercising regularly is important for overall health, especially for people who have diabetes  mellitus.  Exercising has many health benefits. It increases muscle strength and bone density and reduces body fat and stress. It also lowers and controls blood glucose.  Your health care provider or certified diabetes educator can help you make an activity plan for the type and frequency of exercise that works for you.  Work with your health care provider to make sure any new activity is safe for you. Also work with your health care provider to adjust your insulin, other medicines, and the food you eat. This information is not intended to replace advice given to you by your health care provider. Make sure you discuss any questions you have with your health care provider. Document Revised: 03/24/2019 Document Reviewed: 03/24/2019 Elsevier Patient Education  2021 Elsevier Inc.  

## 2020-11-17 LAB — CMP14+EGFR
ALT: 26 IU/L (ref 0–44)
AST: 21 IU/L (ref 0–40)
Albumin/Globulin Ratio: 1.6 (ref 1.2–2.2)
Albumin: 4.3 g/dL (ref 3.7–4.7)
Alkaline Phosphatase: 81 IU/L (ref 44–121)
BUN/Creatinine Ratio: 14 (ref 10–24)
BUN: 19 mg/dL (ref 8–27)
Bilirubin Total: <0.2 mg/dL (ref 0.0–1.2)
CO2: 23 mmol/L (ref 20–29)
Calcium: 10 mg/dL (ref 8.6–10.2)
Chloride: 105 mmol/L (ref 96–106)
Creatinine, Ser: 1.33 mg/dL — ABNORMAL HIGH (ref 0.76–1.27)
Globulin, Total: 2.7 g/dL (ref 1.5–4.5)
Glucose: 140 mg/dL — ABNORMAL HIGH (ref 65–99)
Potassium: 4.9 mmol/L (ref 3.5–5.2)
Sodium: 143 mmol/L (ref 134–144)
Total Protein: 7 g/dL (ref 6.0–8.5)
eGFR: 57 mL/min/{1.73_m2} — ABNORMAL LOW (ref >59)

## 2020-11-17 LAB — LIPID PANEL
Chol/HDL Ratio: 3 ratio (ref 0.0–5.0)
Cholesterol, Total: 131 mg/dL (ref 100–199)
HDL: 44 mg/dL (ref >39)
LDL Chol Calc (NIH): 73 mg/dL (ref 0–99)
Triglycerides: 65 mg/dL (ref 0–149)
VLDL Cholesterol Cal: 14 mg/dL (ref 5–40)

## 2020-11-18 ENCOUNTER — Telehealth: Payer: Self-pay

## 2020-11-18 NOTE — Telephone Encounter (Signed)
Patient name and DOB has been verified Patient was informed of lab results. Patient had no questions.  

## 2020-11-18 NOTE — Telephone Encounter (Signed)
-----   Message from Charlott Rakes, MD sent at 11/17/2020  4:58 PM EDT ----- Cholesterol is normal, liver function is normal, kidney function is slightly abnormal.  Please advise him to abstain from medications like Aleve and ibuprofen which can worsen kidney function.  Increase hydration.

## 2020-11-19 NOTE — Telephone Encounter (Signed)
Pt has been informed of lab results. 

## 2020-11-24 ENCOUNTER — Telehealth: Payer: Self-pay | Admitting: Family Medicine

## 2020-11-24 NOTE — Telephone Encounter (Signed)
Sent an encounter earlier regarding this. Patient calling back and another encounter was created. Please follow up.

## 2020-11-24 NOTE — Telephone Encounter (Unsigned)
Copied from [place]. Topic: Quick Communication - Rx Refill/Question >> Nov 24, 2020 11:11 AM Erick Blinks wrote: Best contact [phone] Pt is requesting a call back from clinic regarding medication questions, glipiZIDE (GLUCOTROL) 5 MG tablet Has questions and concerns

## 2020-11-24 NOTE — Telephone Encounter (Signed)
Pt called to ask Dr. Margarita Rana if her is suppose to take the Rx for glipiZIDE (GLUCOTROL) 5 MG tablet He stated the last Rx was for 10mg  and he was told to no longer take it /  and this one is for 5mg / should he start to take this/ please advise today if possible

## 2020-11-24 NOTE — Telephone Encounter (Signed)
Pt called and informed to take 5mg  glipizide BID

## 2020-11-25 ENCOUNTER — Other Ambulatory Visit: Payer: Self-pay

## 2020-12-02 ENCOUNTER — Other Ambulatory Visit: Payer: Self-pay

## 2020-12-02 MED FILL — Diclofenac Sodium Tab Delayed Release 75 MG: ORAL | 30 days supply | Qty: 60 | Fill #1 | Status: AC

## 2020-12-10 ENCOUNTER — Ambulatory Visit: Payer: Self-pay | Admitting: *Deleted

## 2020-12-14 ENCOUNTER — Other Ambulatory Visit: Payer: Self-pay

## 2020-12-14 MED FILL — Atorvastatin Calcium Tab 40 MG (Base Equivalent): ORAL | 30 days supply | Qty: 30 | Fill #1 | Status: AC

## 2020-12-27 ENCOUNTER — Other Ambulatory Visit: Payer: Self-pay | Admitting: Family Medicine

## 2020-12-27 ENCOUNTER — Other Ambulatory Visit: Payer: Self-pay

## 2020-12-27 DIAGNOSIS — M19049 Primary osteoarthritis, unspecified hand: Secondary | ICD-10-CM

## 2020-12-27 MED ORDER — DICLOFENAC SODIUM 75 MG PO TBEC
DELAYED_RELEASE_TABLET | Freq: Two times a day (BID) | ORAL | 2 refills | Status: DC
  Filled 2020-12-27: qty 60, 30d supply, fill #0
  Filled 2021-01-30: qty 60, 30d supply, fill #1
  Filled 2021-03-15: qty 60, 30d supply, fill #2

## 2020-12-28 ENCOUNTER — Other Ambulatory Visit: Payer: Self-pay

## 2021-01-17 ENCOUNTER — Other Ambulatory Visit: Payer: Self-pay

## 2021-01-17 MED FILL — Pantoprazole Sodium EC Tab 40 MG (Base Equiv): ORAL | 30 days supply | Qty: 30 | Fill #0 | Status: AC

## 2021-01-24 MED FILL — Atorvastatin Calcium Tab 40 MG (Base Equivalent): ORAL | 30 days supply | Qty: 30 | Fill #2 | Status: AC

## 2021-01-25 ENCOUNTER — Other Ambulatory Visit: Payer: Self-pay

## 2021-01-31 ENCOUNTER — Other Ambulatory Visit: Payer: Self-pay

## 2021-02-03 ENCOUNTER — Other Ambulatory Visit: Payer: Self-pay

## 2021-02-04 ENCOUNTER — Other Ambulatory Visit: Payer: Self-pay

## 2021-02-04 MED FILL — Pantoprazole Sodium EC Tab 40 MG (Base Equiv): ORAL | 30 days supply | Qty: 30 | Fill #1 | Status: CN

## 2021-02-08 ENCOUNTER — Other Ambulatory Visit: Payer: Self-pay

## 2021-02-08 MED FILL — Pantoprazole Sodium EC Tab 40 MG (Base Equiv): ORAL | 30 days supply | Qty: 30 | Fill #1 | Status: CN

## 2021-02-11 ENCOUNTER — Other Ambulatory Visit: Payer: Self-pay

## 2021-02-11 MED FILL — Pantoprazole Sodium EC Tab 40 MG (Base Equiv): ORAL | 30 days supply | Qty: 30 | Fill #1 | Status: AC

## 2021-02-12 ENCOUNTER — Other Ambulatory Visit: Payer: Self-pay | Admitting: Family Medicine

## 2021-02-12 DIAGNOSIS — E1142 Type 2 diabetes mellitus with diabetic polyneuropathy: Secondary | ICD-10-CM

## 2021-02-12 DIAGNOSIS — Z794 Long term (current) use of insulin: Secondary | ICD-10-CM

## 2021-02-12 NOTE — Telephone Encounter (Signed)
Requested Prescriptions  Pending Prescriptions Disp Refills  . glucose blood test strip [Pharmacy Med Name: Accu-Chek Aviva Plus In Vitro Strip] 100 each 0    Sig: USE 1 STRIP TO CHECK GLUCOSE THREE TIMES DAILY FOR  BLOOD  SUGAR     Endocrinology: Diabetes - Testing Supplies Passed - 02/12/2021  6:52 AM      Passed - Valid encounter within last 12 months    Recent Outpatient Visits          2 months ago Type 2 diabetes mellitus with diabetic polyneuropathy, with long-term current use of insulin ([place])   [place], Corinne, MD   4 months ago Type 2 diabetes mellitus with diabetic polyneuropathy, with long-term current use of insulin ([place])   [place], Daishon L, RPH-CPP   5 months ago Type 2 diabetes mellitus with diabetic polyneuropathy, with long-term current use of insulin [facility])   [place], Gabriel L, RPH-CPP   5 months ago Type 2 diabetes mellitus with diabetic polyneuropathy, with long-term current use of insulin [facility])   [place], Annie Main L, RPH-CPP   6 months ago Type 2 diabetes mellitus with diabetic polyneuropathy, with long-term current use of insulin ([place])   [place], Enobong, MD      Future Appointments            In 2 days Charlott Rakes, MD [place]

## 2021-02-14 ENCOUNTER — Other Ambulatory Visit: Payer: Self-pay

## 2021-02-14 ENCOUNTER — Encounter: Payer: Self-pay | Admitting: Family Medicine

## 2021-02-14 ENCOUNTER — Ambulatory Visit: Payer: Medicare Other | Attending: Family Medicine | Admitting: Family Medicine

## 2021-02-14 DIAGNOSIS — Z794 Long term (current) use of insulin: Secondary | ICD-10-CM | POA: Diagnosis not present

## 2021-02-14 DIAGNOSIS — E11649 Type 2 diabetes mellitus with hypoglycemia without coma: Secondary | ICD-10-CM | POA: Diagnosis not present

## 2021-02-14 DIAGNOSIS — E1142 Type 2 diabetes mellitus with diabetic polyneuropathy: Secondary | ICD-10-CM

## 2021-02-14 LAB — POCT GLYCOSYLATED HEMOGLOBIN (HGB A1C): HbA1c, POC (controlled diabetic range): 7.5 % — AB (ref 0.0–7.0)

## 2021-02-14 MED ORDER — INSULIN GLARGINE 100 UNIT/ML SOLOSTAR PEN
25.0000 [IU] | PEN_INJECTOR | Freq: Every day | SUBCUTANEOUS | 6 refills | Status: DC
  Filled 2021-02-14 – 2021-04-25 (×2): qty 6, 24d supply, fill #0
  Filled 2021-06-13: qty 6, 24d supply, fill #1
  Filled 2021-08-08: qty 6, 24d supply, fill #2
  Filled 2021-08-08: qty 6, 24d supply, fill #0
  Filled 2021-09-05: qty 6, 24d supply, fill #1

## 2021-02-14 NOTE — Progress Notes (Signed)
Virtual Visit via Telephone Note  I connected with Jerry Serrano, on 02/14/2021 at 9:29 AM by telephone due to the COVID-19 pandemic and verified that I am speaking with the correct person using two identifiers.   Consent: I discussed the limitations, risks, security and privacy concerns of performing an evaluation and management service by telephone and the availability of in person appointments. I also discussed with the patient that there may be a patient responsible charge related to this service. The patient expressed understanding and agreed to proceed.   Location of Patient: Home  Location of Provider: Clinic   Persons participating in Telemedicine visit: Hussain Maimone Gregg Dr. Margarita Rana     History of Present Illness: Jerry Serrano is a 70 year old male with history of hypertension, hyperlipidemia, type 2 diabetes mellitus (A1c 7.5) who presents today for a  follow-up visit.    His A1c is 7.5 which is down from 9.1 previously. Blood sugars have dropped to about 45, 57 when he wakes up at around 12 noon.   He usually has to take some orange juice or drink soda.  He works third shift at the [employer] and his dinner is between 10 PM to 12 midnight after which he goes to bed around 2 AM and wakes up around 9 AM to 12 noon when he administers his Lantus.  Random sugars are around 210. He is currently on Glipizide 5 mg twice daily, Lantus 20 units at bedtime and metformin 1-1/2 tablets in the a.m. and 1 tablet in p.m..  He continues to complain of his high co-pay with Lantus. He has no additional concerns today. Past Medical History:  Diagnosis Date   Allergy    Arthritis    Diabetes mellitus    Hyperlipidemia    Hypertension    No Known Allergies  Current Outpatient Medications on File Prior to Visit  Medication Sig Dispense Refill   amLODipine (NORVASC) 10 MG tablet Take 1 tablet (10 mg total) by mouth daily. 30 tablet 2   aspirin EC 81 MG tablet Take 1 tablet (81  mg total) by mouth daily. 30 tablet 3   atorvastatin (LIPITOR) 40 MG tablet TAKE 1 TABLET (40 MG TOTAL) BY MOUTH DAILY. 90 tablet 1   betamethasone dipropionate 0.05 % cream APPLY EXTERNALLY TO THE AFFECTED AREA TWICE DAILY 30 g 0   Blood Glucose Monitoring Suppl (ACCU-CHEK AVIVA PLUS) w/Device KIT USE AS DIRECTED 4 TIMES DAILY AFTER MEALS AND AT BEDTIME. 1 kit 0   Blood Glucose Monitoring Suppl (ACCU-CHEK GUIDE ME) w/Device KIT USE AS DIRECTED 4 TIMES DAILY AFTER MEALS AND AT BEDTIME. 1 kit 0   diclofenac (VOLTAREN) 75 MG EC tablet TAKE 1 TABLET (75 MG TOTAL) BY MOUTH 2 (TWO) TIMES DAILY. 60 tablet 2   gabapentin (NEURONTIN) 300 MG capsule Take 1 capsule (300 mg total) by mouth daily. 30 capsule 6   glipiZIDE (GLUCOTROL) 5 MG tablet Take 1 tablet (5 mg total) by mouth 2 (two) times daily before a meal. 60 tablet 3   glucose blood test strip USE 1 STRIP TO CHECK GLUCOSE THREE TIMES DAILY FOR  BLOOD  SUGAR 100 each 0   insulin glargine (LANTUS) 100 UNIT/ML Solostar Pen INJECT 20 UNITS INTO THE SKIN DAILY. 30 mL 6   ketoconazole (NIZORAL) 2 % cream Apply 1 application topically daily. 15 g 0   Lancet Devices (ACCU-CHEK SOFTCLIX) lancets Use as instructed 1 each 12   losartan-hydrochlorothiazide (HYZAAR) 100-25 MG tablet TAKE 1  TABLET BY MOUTH DAILY. *DISCONTINUE LOSARTAN 50/12.5* 90 tablet 1   metFORMIN (GLUCOPHAGE) 1000 MG tablet TAKE 1 AND 1/2 TABLETS ($RemoveBefo'1500MG'CSgVbazfVeS$ ) BY MOUTH IN THE MORNING AND 1 TABLET ($RemoveB'1000MG'aGPNsNwf$ ) IN THE EVENING. 225 tablet 1   MICROLET LANCETS MISC Test 3 times per day 100 each 12   olopatadine (PATANOL) 0.1 % ophthalmic solution Place 1 drop into both eyes 2 (two) times daily.     pantoprazole (PROTONIX) 40 MG tablet TAKE 1 TABLET BY MOUTH DAILY. (Patient not taking: No sig reported) 30 tablet 5   sildenafil (VIAGRA) 50 MG tablet Take 1 tablet (50 mg total) by mouth daily as needed for erectile dysfunction. (Patient not taking: No sig reported) 10 tablet 1   Current Facility-Administered  Medications on File Prior to Visit  Medication Dose Route Frequency Provider Last Rate Last Admin   0.9 %  sodium chloride infusion  500 mL Intravenous Continuous Nelida Meuse III, MD        ROS: See HPI   Observations/Objective: Awake, alert, oriented x3 Not in acute distress   CMP Latest Ref Rng & Units 11/16/2020 05/13/2020 11/03/2019  Glucose 65 - 99 mg/dL 140(H) 131(H) 151(H)  BUN 8 - 27 mg/dL $Remove'19 17 19  'QPOJcSd$ Creatinine 0.76 - 1.27 mg/dL 1.33(H) 1.04 0.99  Sodium 134 - 144 mmol/L 143 138 140  Potassium 3.5 - 5.2 mmol/L 4.9 4.7 4.1  Chloride 96 - 106 mmol/L 105 100 104  CO2 20 - 29 mmol/L $RemoveB'23 25 21  'EATGMnpq$ Calcium 8.6 - 10.2 mg/dL 10.0 10.2 9.6  Total Protein 6.0 - 8.5 g/dL 7.0 7.8 -  Total Bilirubin 0.0 - 1.2 mg/dL <0.2 0.5 -  Alkaline Phos 44 - 121 IU/L 81 83 -  AST 0 - 40 IU/L 21 25 -  ALT 0 - 44 IU/L 26 24 -     Lipid Panel     Component Value Date/Time   CHOL 131 11/16/2020 1042   TRIG 65 11/16/2020 1042   HDL 44 11/16/2020 1042   CHOLHDL 3.0 11/16/2020 1042   CHOLHDL 2.8 02/29/2016 1018   VLDL 12 02/29/2016 1018   LDLCALC 73 11/16/2020 1042   LABVLDL 14 11/16/2020 1042     Lab Results  Component Value Date   HGBA1C 7.5 (A) 02/14/2021    Assessment and Plan: 1. Type 2 diabetes mellitus with diabetic polyneuropathy, with long-term current use of insulin (HCC) Controlled with A1c of 7.5; goal is less than 7.5 due to age and comorbidities Neuropathy is controlled Unfortunately due to hypoglycemic episodes I will discontinue glipizide and have increased his Lantus dose from 20 to 25 units. High co-pays of SGLT2 and GLP-1 will make this unaffordable for him. Counseled on Diabetic diet, my plate method, 099 minutes of moderate intensity exercise/week Blood sugar logs with fasting goals of 80-120 mg/dl, random of less than 180 and in the event of sugars less than 60 mg/dl or greater than 400 mg/dl encouraged to notify the clinic. Advised on the need for annual eye exams,  annual foot exams, Pneumonia vaccine. - POCT glycosylated hemoglobin (Hb A1C) - insulin glargine (LANTUS) 100 UNIT/ML Solostar Pen; Inject 25 Units into the skin daily.  Dispense: 30 mL; Refill: 6  2. Type 2 diabetes mellitus with hypoglycemia without coma, with long-term current use of insulin (HCC) Likely sulfonylurea induced coupled with insulin use Will discontinue glipizide Advised to eat small frequent meals with snacks In the event of hypoglycemia decrease Lantus dose from 25 units to 23 units.  Follow Up Instructions: 3 months   I discussed the assessment and treatment plan with the patient. The patient was provided an opportunity to ask questions and all were answered. The patient agreed with the plan and demonstrated an understanding of the instructions.   The patient was advised to call back or seek an in-person evaluation if the symptoms worsen or if the condition fails to improve as anticipated.     I provided 16 minutes total of non-face-to-face time during this encounter.   Charlott Rakes, MD, FAAFP. [facility] and [place], [place]   02/14/2021, 9:29 AM

## 2021-02-19 MED FILL — Atorvastatin Calcium Tab 40 MG (Base Equivalent): ORAL | 30 days supply | Qty: 30 | Fill #3 | Status: AC

## 2021-02-21 ENCOUNTER — Other Ambulatory Visit: Payer: Self-pay

## 2021-03-04 ENCOUNTER — Other Ambulatory Visit: Payer: Self-pay

## 2021-03-04 MED FILL — Atorvastatin Calcium Tab 40 MG (Base Equivalent): ORAL | 30 days supply | Qty: 30 | Fill #4 | Status: CN

## 2021-03-07 ENCOUNTER — Other Ambulatory Visit: Payer: Self-pay | Admitting: *Deleted

## 2021-03-07 NOTE — Patient Outreach (Signed)
[facility]) Care Management  03/07/2021  Delshon Kanhai Sek 1950/09/30 [id number]   RN Health Coach attempted follow up outreach call to patient.  Patient was unavailable. HIPPA compliance voicemail message left with return callback number.  Plan: RN will call patient again within 30 days.  [place] [phone]

## 2021-03-09 ENCOUNTER — Other Ambulatory Visit: Payer: Self-pay

## 2021-03-10 ENCOUNTER — Other Ambulatory Visit: Payer: Self-pay

## 2021-03-15 ENCOUNTER — Other Ambulatory Visit: Payer: Self-pay

## 2021-03-15 MED FILL — Pantoprazole Sodium EC Tab 40 MG (Base Equiv): ORAL | 30 days supply | Qty: 30 | Fill #2 | Status: AC

## 2021-03-15 MED FILL — Atorvastatin Calcium Tab 40 MG (Base Equivalent): ORAL | 30 days supply | Qty: 30 | Fill #4 | Status: AC

## 2021-03-16 ENCOUNTER — Other Ambulatory Visit: Payer: Self-pay

## 2021-03-25 ENCOUNTER — Ambulatory Visit (HOSPITAL_BASED_OUTPATIENT_CLINIC_OR_DEPARTMENT_OTHER): Payer: Medicare Other

## 2021-03-25 ENCOUNTER — Other Ambulatory Visit (HOSPITAL_COMMUNITY): Payer: Self-pay

## 2021-03-25 DIAGNOSIS — Z0000 Encounter for general adult medical examination without abnormal findings: Secondary | ICD-10-CM | POA: Diagnosis not present

## 2021-03-25 NOTE — Patient Instructions (Signed)
Health Maintenance, Male Adopting a healthy lifestyle and getting preventive care are important in promoting health and wellness. Ask your health care provider about: The right schedule for you to have regular tests and exams. Things you can do on your own to prevent diseases and keep yourself healthy. What should I know about diet, weight, and exercise? Eat a healthy diet  Eat a diet that includes plenty of vegetables, fruits, low-fat dairy products, and lean protein. Do not eat a lot of foods that are high in solid fats, added sugars, or sodium. Maintain a healthy weight Body mass index (BMI) is a measurement that can be used to identify possible weight problems. It estimates body fat based on height and weight. Your health care provider can help determine your BMI and help you achieve or maintain a healthy weight. Get regular exercise Get regular exercise. This is one of the most important things you can do for your health. Most adults should: Exercise for at least 150 minutes each week. The exercise should increase your heart rate and make you sweat (moderate-intensity exercise). Do strengthening exercises at least twice a week. This is in addition to the moderate-intensity exercise. Spend less time sitting. Even light physical activity can be beneficial. Watch cholesterol and blood lipids Have your blood tested for lipids and cholesterol at 72 years of age, then have this test every 5 years. You may need to have your cholesterol levels checked more often if: Your lipid or cholesterol levels are high. You are older than 72 years of age. You are at high risk for heart disease. What should I know about cancer screening? Many types of cancers can be detected early and may often be prevented. Depending on your health history and family history, you may need to have cancer screening at various ages. This may include screening for: Colorectal cancer. Prostate cancer. Skin cancer. Lung  cancer. What should I know about heart disease, diabetes, and high blood pressure? Blood pressure and heart disease High blood pressure causes heart disease and increases the risk of stroke. This is more likely to develop in people who have high blood pressure readings, are of African descent, or are overweight. Talk with your health care provider about your target blood pressure readings. Have your blood pressure checked: Every 3-5 years if you are 18-39 years of age. Every year if you are 40 years old or older. If you are between the ages of 65 and 75 and are a current or former smoker, ask your health care provider if you should have a one-time screening for abdominal aortic aneurysm (AAA). Diabetes Have regular diabetes screenings. This checks your fasting blood sugar level. Have the screening done: Once every three years after age 45 if you are at a normal weight and have a low risk for diabetes. More often and at a younger age if you are overweight or have a high risk for diabetes. What should I know about preventing infection? Hepatitis B If you have a higher risk for hepatitis B, you should be screened for this virus. Talk with your health care provider to find out if you are at risk for hepatitis B infection. Hepatitis C Blood testing is recommended for: Everyone born from 1945 through 1965. Anyone with known risk factors for hepatitis C. Sexually transmitted infections (STIs) You should be screened each year for STIs, including gonorrhea and chlamydia, if: You are sexually active and are younger than 72 years of age. You are older than 72 years   of age and your health care provider tells you that you are at risk for this type of infection. Your sexual activity has changed since you were last screened, and you are at increased risk for chlamydia or gonorrhea. Ask your health care provider if you are at risk. Ask your health care provider about whether you are at high risk for HIV.  Your health care provider may recommend a prescription medicine to help prevent HIV infection. If you choose to take medicine to prevent HIV, you should first get tested for HIV. You should then be tested every 3 months for as long as you are taking the medicine. Follow these instructions at home: Lifestyle Do not use any products that contain nicotine or tobacco, such as cigarettes, e-cigarettes, and chewing tobacco. If you need help quitting, ask your health care provider. Do not use street drugs. Do not share needles. Ask your health care provider for help if you need support or information about quitting drugs. Alcohol use Do not drink alcohol if your health care provider tells you not to drink. If you drink alcohol: Limit how much you have to 0-2 drinks a day. Be aware of how much alcohol is in your drink. In the [place] one drink equals one 12 oz bottle of beer (355 mL), one 5 oz glass of wine (148 mL), or one 1 oz glass of hard liquor (44 mL). General instructions Schedule regular health, dental, and eye exams. Stay current with your vaccines. Tell your health care provider if: You often feel depressed. You have ever been abused or do not feel safe at home. Summary Adopting a healthy lifestyle and getting preventive care are important in promoting health and wellness. Follow your health care provider's instructions about healthy diet, exercising, and getting tested or screened for diseases. Follow your health care provider's instructions on monitoring your cholesterol and blood pressure. This information is not intended to replace advice given to you by your health care provider. Make sure you discuss any questions you have with your health care provider. Document Revised: 09/03/2020 Document Reviewed: 06/19/2018 Elsevier Patient Education  2022 [employer]  

## 2021-03-25 NOTE — Progress Notes (Addendum)
Subjective:   Jerry Serrano is a 70 y.o. male who presents for an Initial Medicare Annual Wellness Visit.I connected with  [name] on 03/25/21 by a audio enabled telemedicine application and verified that I am speaking with the correct person using two identifiers.   I discussed the limitations of evaluation and management by telemedicine. The patient expressed understanding and agreed to proceed.   Location of patient: Home Location of provider: Office  Persons participating in visit: [name] (patient) and Drenda Freeze, [place]  Review of Systems    Defer to PCP       Objective:    There were no vitals filed for this visit. There is no height or weight on file to calculate BMI.  Advanced Directives 03/25/2021 08/11/2020 02/06/2019 01/07/2019 06/04/2018 04/10/2017 11/01/2016  Does Patient Have a Medical Advance Directive? No No No No No No Yes  Type of Advance Directive - - - - - - Living will  Would patient like information on creating a medical advance directive? No - Patient declined No - Patient declined Yes (ED - Information included in AVS) No - Patient declined No - Patient declined - -    Current Medications (verified) Outpatient Encounter Medications as of 03/25/2021  Medication Sig   aspirin EC 81 MG tablet Take 1 tablet (81 mg total) by mouth daily.   atorvastatin (LIPITOR) 40 MG tablet TAKE 1 TABLET (40 MG TOTAL) BY MOUTH DAILY.   betamethasone dipropionate 0.05 % cream APPLY EXTERNALLY TO THE AFFECTED AREA TWICE DAILY   Blood Glucose Monitoring Suppl (ACCU-CHEK AVIVA PLUS) w/Device KIT USE AS DIRECTED 4 TIMES DAILY AFTER MEALS AND AT BEDTIME.   Blood Glucose Monitoring Suppl (ACCU-CHEK GUIDE ME) w/Device KIT USE AS DIRECTED 4 TIMES DAILY AFTER MEALS AND AT BEDTIME.   diclofenac (VOLTAREN) 75 MG EC tablet TAKE 1 TABLET (75 MG TOTAL) BY MOUTH 2 (TWO) TIMES DAILY.   gabapentin (NEURONTIN) 300 MG capsule Take 1 capsule (300 mg total) by mouth daily.    glucose blood test strip USE 1 STRIP TO CHECK GLUCOSE THREE TIMES DAILY FOR  BLOOD  SUGAR   insulin glargine (LANTUS) 100 UNIT/ML Solostar Pen Inject 25 Units into the skin daily.   ketoconazole (NIZORAL) 2 % cream Apply 1 application topically daily.   Lancet Devices (ACCU-CHEK SOFTCLIX) lancets Use as instructed   losartan-hydrochlorothiazide (HYZAAR) 100-25 MG tablet TAKE 1 TABLET BY MOUTH DAILY. *DISCONTINUE LOSARTAN 50/12.5*   metFORMIN (GLUCOPHAGE) 1000 MG tablet TAKE 1 AND 1/2 TABLETS ($RemoveBefo'1500MG'hUABqURJGFv$ ) BY MOUTH IN THE MORNING AND 1 TABLET ($RemoveB'1000MG'ipqgmvNr$ ) IN THE EVENING.   MICROLET LANCETS MISC Test 3 times per day   olopatadine (PATANOL) 0.1 % ophthalmic solution Place 1 drop into both eyes 2 (two) times daily.   amLODipine (NORVASC) 10 MG tablet Take 1 tablet (10 mg total) by mouth daily.   pantoprazole (PROTONIX) 40 MG tablet TAKE 1 TABLET BY MOUTH DAILY. (Patient not taking: No sig reported)   sildenafil (VIAGRA) 50 MG tablet Take 1 tablet (50 mg total) by mouth daily as needed for erectile dysfunction. (Patient not taking: No sig reported)   Facility-Administered Encounter Medications as of 03/25/2021  Medication   0.9 %  sodium chloride infusion    Allergies (verified) Patient has no known allergies.   History: Past Medical History:  Diagnosis Date   Allergy    Arthritis    Diabetes mellitus    Hyperlipidemia    Hypertension    Past Surgical History:  Procedure Laterality Date   HERNIA REPAIR     Family History  Problem Relation Age of Onset   Colon cancer Neg Hx    Esophageal cancer Neg Hx    Rectal cancer Neg Hx    Stomach cancer Neg Hx    Social History   Socioeconomic History   Marital status: Married    Spouse name: Not on file   Number of children: Not on file   Years of education: Not on file   Highest education level: Not on file  Occupational History   Not on file  Tobacco Use   Smoking status: Former    Types: Cigarettes   Smokeless tobacco: Never   Tobacco  comments:    over 30 years ago  Vaping Use   Vaping Use: Never used  Substance and Sexual Activity   Alcohol use: No   Drug use: No   Sexual activity: Not on file  Other Topics Concern   Not on file  Social History Narrative   Not on file   Social Determinants of Health   Financial Resource Strain: Low Risk    Difficulty of Paying Living Expenses: Not hard at all  Food Insecurity: No Food Insecurity   Worried About Charity fundraiser in the Last Year: Never true   [place] in the Last Year: Never true  Transportation Needs: No Transportation Needs   Lack of Transportation (Medical): No   Lack of Transportation (Non-Medical): No  Physical Activity: Inactive   Days of Exercise per Week: 0 days   Minutes of Exercise per Session: 0 min  Stress: No Stress Concern Present   Feeling of Stress : Not at all  Social Connections: Socially Isolated   Frequency of Communication with Friends and Family: More than three times a week   Frequency of Social Gatherings with Friends and Family: More than three times a week   Attends Religious Services: Never   Marine scientist or Organizations: No   Attends Archivist Meetings: Never   Marital Status: Widowed    Tobacco Counseling Counseling given: Not Answered Tobacco comments: over 30 years ago   Clinical Intake:  Pre-visit preparation completed: Yes  Pain : No/denies pain     Diabetes: Yes CBG done?: No Did pt. bring in CBG monitor from home?: No  How often do you need to have someone help you when you read instructions, pamphlets, or other written materials from your doctor or pharmacy?: 1 - Never What is the last grade level you completed in school?: GED  Diabetic?Yes     Information entered by :: Drenda Freeze, [place]   Activities of Daily Living In your present state of health, do you have any difficulty performing the following activities: 03/25/2021  Hearing? N  Vision? N  Difficulty  concentrating or making decisions? N  Walking or climbing stairs? N  Dressing or bathing? N  Doing errands, shopping? N  Preparing Food and eating ? N  Using the Toilet? N  In the past six months, have you accidently leaked urine? N  Do you have problems with loss of bowel control? N  Managing your Medications? N  Managing your Finances? N  Housekeeping or managing your Housekeeping? N  Some recent data might be hidden    Patient Care Team: Charlott Rakes, MD as PCP - General (Family Medicine) [place], Eppie Gibson, RN as [place] any recent [place] you [phone]  have received from other than [facility] providers in the past year (date may be approximate).     Assessment:   This is a routine wellness examination for Christain.  Hearing/Vision screen No results found.  Dietary issues and exercise activities discussed:     Goals Addressed   None    Depression Screen PHQ 2/9 Scores 03/25/2021 11/16/2020 08/16/2020 07/18/2019 07/18/2019 02/06/2019 01/07/2019  PHQ - 2 Score 0 0 0 0 0 0 0  PHQ- 9 Score - 0 0 - - - -    Fall Risk Fall Risk  03/25/2021 11/16/2020 11/10/2020 08/16/2020 08/11/2020  Falls in the past year? 0 0 0 0 0  Number falls in past yr: 0 0 0 0 0  Injury with Fall? 0 0 0 0 0  Risk for fall due to : No Fall Risks - Impaired vision - Impaired vision  Follow up Falls evaluation completed - Falls evaluation completed - -    FALL RISK PREVENTION PERTAINING TO THE HOME:  Any stairs in or around the home? No  If so, are there any without handrails? No  Home free of loose throw rugs in walkways, pet beds, electrical cords, etc? Yes  Adequate lighting in your home to reduce risk of falls? Yes   ASSISTIVE DEVICES UTILIZED TO PREVENT FALLS:  Life alert? No  Use of a cane, walker or w/c? No  Grab bars in the bathroom? No  Shower chair or bench in shower? No  Elevated toilet seat or a handicapped toilet? No   TIMED UP AND GO:  Was the  test performed?  N/A .  Length of time to ambulate 10 feet: N/A sec.     Cognitive Function:     6CIT Screen 03/25/2021  What Year? 0 points  What month? 0 points  What time? 0 points  Count back from 20 0 points  Months in reverse 0 points  Repeat phrase 0 points  Total Score 0    Immunizations Immunization History  Administered Date(s) Administered   Fluad Quad(high Dose 65+) 07/18/2019   Influenza Split 08/08/2012, 04/06/2015   Influenza,inj,Quad PF,6+ Mos 06/04/2018, 05/13/2020   Influenza-Unspecified 02/16/2016   PFIZER(Purple Top)SARS-COV-2 Vaccination 09/11/2019, 10/07/2019   Pneumococcal Conjugate-13 04/10/2017   Pneumococcal Polysaccharide-23 02/01/2016   Tdap 02/21/2018    TDAP status: Up to date  Flu Vaccine status: Due, Education has been provided regarding the importance of this vaccine. Advised may receive this vaccine at local pharmacy or Health Dept. Aware to provide a copy of the vaccination record if obtained from local pharmacy or Health Dept. Verbalized acceptance and understanding.  Pneumococcal vaccine status: Up to date  Covid-19 vaccine status: Completed vaccines  Qualifies for Shingles Vaccine? Yes   Zostavax completed No   Shingrix Completed?: No.    Education has been provided regarding the importance of this vaccine. Patient has been advised to call insurance company to determine out of pocket expense if they have not yet received this vaccine. Advised may also receive vaccine at local pharmacy or Health Dept. Verbalized acceptance and understanding.  Screening Tests Health Maintenance  Topic Date Due   Zoster Vaccines- Shingrix (1 of 2) Never done   COVID-19 Vaccine (3 - Booster for Pfizer series) 03/08/2020   INFLUENZA VACCINE  02/07/2021   FOOT EXAM  08/16/2021   HEMOGLOBIN A1C  08/17/2021   OPHTHALMOLOGY EXAM  11/02/2021   COLONOSCOPY (Pts 45-80yrs Insurance coverage will need to be confirmed)  11/02/2026   TETANUS/TDAP  02/22/2028  Hepatitis C Screening  Completed   PNA vac Low Risk Adult  Completed   HPV VACCINES  Aged Out    Health Maintenance  Health Maintenance Due  Topic Date Due   Zoster Vaccines- Shingrix (1 of 2) Never done   COVID-19 Vaccine (3 - Booster for Pfizer series) 03/08/2020   INFLUENZA VACCINE  02/07/2021    Colorectal cancer screening: Type of screening: Colonoscopy. Completed 11/01/2016. Repeat every 10 years  Lung Cancer Screening: (Low Dose CT Chest recommended if Age 40-80 years, 30 pack-year currently smoking OR have quit w/in 15years.) does not qualify.   Lung Cancer Screening Referral: No  Additional Screening:  Hepatitis C Screening: does qualify; Completed 10/10/2017  Vision Screening: Recommended annual ophthalmology exams for early detection of glaucoma and other disorders of the eye. Is the patient up to date with their annual eye exam?   Does not wear glasses/contacts. Who is the provider or what is the name of the office in which the patient attends annual eye exams? N/A If pt is not established with a provider, would they like to be referred to a provider to establish care?  N/A .   Dental Screening: Recommended annual dental exams for proper oral hygiene  Community Resource Referral / Chronic Care Management: CRR required this visit?  No   CCM required this visit?  No      Plan:     I have personally reviewed and noted the following in the patient's chart:   Medical and social history Use of alcohol, tobacco or illicit drugs  Current medications and supplements including opioid prescriptions. Patient is not currently taking opioid prescriptions. Functional ability and status Nutritional status Physical activity Advanced directives List of other physicians Hospitalizations, surgeries, and ER visits in previous 12 months Vitals Screenings to include cognitive, depression, and falls Referrals and appointments  In addition, I have reviewed and discussed  with patient certain preventive protocols, quality metrics, and best practice recommendations. A written personalized care plan for preventive services as well as general preventive health recommendations were provided to patient.     Drenda Freeze, [facility]   03/25/2021   Nurse Notes: Non-Face to Face 60 minute visit Encounter.  Mr. Holeman , Thank you for taking time to come for your Medicare Wellness Visit. I appreciate your ongoing commitment to your health goals. Please review the following plan we discussed and let me know if I can assist you in the future.   These are the goals we discussed:  Goals      ((THN)Obtain Eye Exam-Diabetes Type 2     Timeframe:  Long-Range Goal Priority:  Medium Start Date:      [id number]                       Expected End Date:    [id number]               Follow Up Date [id number]   - schedule appointment with eye doctor    Why is this important?   Eye check-ups are important when you have diabetes.  Vision loss can be prevented.    Notes:  Last eye 04/05/2020     (THN)Monitor and Manage My Blood Sugar-Diabetes Type 2     Timeframe:  Long-Range Goal Priority:  High Start Date:   [id number]                          Expected  End Date:      [id number]                 Follow Up Date [id number]   - check blood sugar at prescribed times - check blood sugar if I feel it is too high or too low - enter blood sugar readings and medication or insulin into daily log - take the blood sugar log to all doctor visits - take the blood sugar meter to all doctor visits    Why is this important?   Checking your blood sugar at home helps to keep it from getting very high or very low.  Writing the results in a diary or log helps the doctor know how to care for you.  Your blood sugar log should have the time, date and the results.  Also, write down the amount of insulin or other medicine that you take.  Other information, like what you ate, exercise done and how you were  feeling, will also be helpful.     Notes:  [id number]/ Patient is not monitoring his blood sugars in the am. He is having episodes of hypoglycemia. Dizziness sometimes at work.      (THN)Perform Foot Care-Diabetes Type 2     Timeframe:  Long-Range Goal Priority:  Medium Start Date: [id number]                            Expected End Date:    [id number]             Follow Up Date [id number]   - check feet daily for cuts, sores or redness - trim toenails straight across - wash and dry feet carefully every day - wear comfortable, cotton socks - wear comfortable, well-fitting shoes    Why is this important?   Good foot care is very important when you have diabetes.  There are many things you can do to keep your feet healthy and catch a problem early.    Notes:      (THN)Set My Target A1C-Diabetes Type 2     Timeframe:  Long-Range Goal Priority:  High Start Date:    73532992                        Expected End Date:  [id number]                     Follow Up Date [id number]   - set target A1C    Why is this important?   Your target A1C is decided together by you and your doctor.  It is based on several things like your age and other health issues.    Note 7.0 goal A1C is 8.1  [id number] A1C is 7.9     Blood Pressure < 140/90     HEMOGLOBIN A1C < 7.0        This is a list of the screening recommended for you and due dates:  Health Maintenance  Topic Date Due   Zoster (Shingles) Vaccine (1 of 2) Never done   COVID-19 Vaccine (3 - Booster for Pfizer series) 03/08/2020   Flu Shot  02/07/2021   Complete foot exam   08/16/2021   Hemoglobin A1C  08/17/2021   Eye exam for diabetics  11/02/2021   Colon Cancer Screening  11/02/2026   Tetanus Vaccine  02/22/2028   Hepatitis C Screening: USPSTF Recommendation to screen - Ages 18-79  yo.  Completed   Pneumonia vaccines  Completed   HPV Vaccine  Aged Out

## 2021-03-28 ENCOUNTER — Other Ambulatory Visit: Payer: Self-pay

## 2021-03-28 MED FILL — Atorvastatin Calcium Tab 40 MG (Base Equivalent): ORAL | 30 days supply | Qty: 30 | Fill #5 | Status: CN

## 2021-03-28 MED FILL — Pantoprazole Sodium EC Tab 40 MG (Base Equiv): ORAL | 30 days supply | Qty: 30 | Fill #3 | Status: CN

## 2021-03-29 ENCOUNTER — Other Ambulatory Visit: Payer: Self-pay

## 2021-04-04 NOTE — Progress Notes (Addendum)
[facility] Note  04/05/2021     CHIEF COMPLAINT Patient presents for Diabetic Eye Exam   HISTORY OF PRESENT ILLNESS: Jerry Serrano is a 70 y.o. male who presents to the clinic today for:   HPI     Diabetic Eye Exam   Vision is stable.  Diabetes characteristics include Type 2.  Blood sugar level is controlled.  Last Blood Glucose 140.  Last A1C 7.  I, the attending physician,  performed the HPI with the patient and updated documentation appropriately.        Comments   Pt states vision is the same OU.  Pt denies eye pain or discomfort and denies any new or worsening floaters or fol OU.      Last edited by Bernarda Caffey, MD on 04/07/2021  1:21 AM.    Referred back by primary care office, but pt states he is seeing Dr. Midge Aver twice a year for glaucoma, pt states he is only taking Pataday    Referring physician: Warden Fillers, MD [address] [place],  [place] [address]  HISTORICAL INFORMATION:   Selected notes from the MEDICAL RECORD NUMBER Referred by [facility] and Wellness for DM exam LEE:  Ocular Hx- PMH-DM (A1c: 8.3 [11.26.19], takes metformin, glipizide, lantus), HTN, HLD, arthritis   CURRENT MEDICATIONS: Current Outpatient Medications (Ophthalmic Drugs)  Medication Sig   olopatadine (PATANOL) 0.1 % ophthalmic solution Place 1 drop into both eyes 2 (two) times daily.   No current facility-administered medications for this visit. (Ophthalmic Drugs)   Current Outpatient Medications (Other)  Medication Sig   amLODipine (NORVASC) 10 MG tablet Take 1 tablet (10 mg total) by mouth daily.   aspirin EC 81 MG tablet Take 1 tablet (81 mg total) by mouth daily.   atorvastatin (LIPITOR) 40 MG tablet TAKE 1 TABLET (40 MG TOTAL) BY MOUTH DAILY.   betamethasone dipropionate 0.05 % cream APPLY EXTERNALLY TO THE AFFECTED AREA TWICE DAILY   Blood Glucose Monitoring Suppl (ACCU-CHEK AVIVA PLUS) w/Device KIT USE AS DIRECTED  4 TIMES DAILY AFTER MEALS AND AT BEDTIME.   Blood Glucose Monitoring Suppl (ACCU-CHEK GUIDE ME) w/Device KIT USE AS DIRECTED 4 TIMES DAILY AFTER MEALS AND AT BEDTIME.   diclofenac (VOLTAREN) 75 MG EC tablet TAKE 1 TABLET (75 MG TOTAL) BY MOUTH 2 (TWO) TIMES DAILY.   gabapentin (NEURONTIN) 300 MG capsule Take 1 capsule (300 mg total) by mouth daily.   glucose blood test strip USE 1 STRIP TO CHECK GLUCOSE THREE TIMES DAILY FOR  BLOOD  SUGAR   insulin glargine (LANTUS) 100 UNIT/ML Solostar Pen Inject 25 Units into the skin daily.   ketoconazole (NIZORAL) 2 % cream Apply 1 application topically daily.   Lancet Devices (ACCU-CHEK SOFTCLIX) lancets Use as instructed   losartan-hydrochlorothiazide (HYZAAR) 100-25 MG tablet TAKE 1 TABLET BY MOUTH DAILY. *DISCONTINUE LOSARTAN 50/12.5*   metFORMIN (GLUCOPHAGE) 1000 MG tablet TAKE 1 AND 1/2 TABLETS (1500MG) BY MOUTH IN THE MORNING AND 1 TABLET (1000MG) IN THE EVENING.   MICROLET LANCETS MISC Test 3 times per day   pantoprazole (PROTONIX) 40 MG tablet TAKE 1 TABLET BY MOUTH DAILY. (Patient not taking: No sig reported)   sildenafil (VIAGRA) 50 MG tablet Take 1 tablet (50 mg total) by mouth daily as needed for erectile dysfunction. (Patient not taking: No sig reported)   Current Facility-Administered Medications (Other)  Medication Route   0.9 %  sodium chloride infusion Intravenous  REVIEW OF SYSTEMS: ROS   Positive for: Endocrine, Eyes Negative for: Constitutional, Gastrointestinal, Neurological, Skin, Genitourinary, Musculoskeletal, HENT, Cardiovascular, Respiratory, Psychiatric, Allergic/Imm, Heme/Lymph Last edited by Doneen Poisson on 04/05/2021  9:26 AM.     ALLERGIES No Known Allergies  PAST MEDICAL HISTORY Past Medical History:  Diagnosis Date   Allergy    Arthritis    Diabetes mellitus    Hyperlipidemia    Hypertension    Past Surgical History:  Procedure Laterality Date   HERNIA REPAIR      FAMILY HISTORY Family History   Problem Relation Age of Onset   Colon cancer Neg Hx    Esophageal cancer Neg Hx    Rectal cancer Neg Hx    Stomach cancer Neg Hx     SOCIAL HISTORY Social History   Tobacco Use   Smoking status: Former    Types: Cigarettes   Smokeless tobacco: Never   Tobacco comments:    over 30 years ago  Vaping Use   Vaping Use: Never used  Substance Use Topics   Alcohol use: No   Drug use: No       OPHTHALMIC EXAM:  Base Eye Exam     Visual Acuity (Snellen - Linear)       Right Left   Dist [place] 20/50 -2 20/40 -2   Dist ph [place] 20/30 -2          Tonometry (Tonopen, 9:48 AM)       Right Left   Pressure 24 25         Pupils       Pupils   Right PERRL   Left PERRL         Visual Fields       Left Right    Full Full         Extraocular Movement       Right Left    Full Full         Neuro/Psych     Oriented x3: Yes   Mood/Affect: Normal           Slit Lamp and Fundus Exam     Slit Lamp Exam       Right Left   Lids/Lashes Dermatochalasis - upper lid Dermatochalasis - upper lid   Conjunctiva/Sclera Mild nasal and temporal Pinguecula, Melanosis Mild nasal and temporal Pinguecula, Melanosis   Cornea 1-2+ inferior Punctate epithelial erosions 1-2+ inferior Punctate epithelial erosions, mild Debris in tear film   Anterior Chamber Deep and quiet Deep and quiet   Iris Round and dilated, No NVI Round and dilated, No NVI   Lens 2-3+ Nuclear sclerosis, 3+ Cortical cataract 2-3+ Nuclear sclerosis, 3+ Cortical cataract, trace Posterior subcapsular cataract   Vitreous mild Vitreous syneresis mild Vitreous syneresis         Fundus Exam       Right Left   Disc Sharp rim, +cupping, +central pallor, temporal PPP, thin superior rim Sharp rim, +cupping, +central pallor, temporal PPP, thin superior rim   C/D Ratio 0.75 0.7   Macula Flat, Good foveal reflex, mild Retinal pigment epithelial mottling, No heme or edema Flat, Good foveal reflex, rare MA, mild  Retinal pigment epithelial mottling, No edema   Vessels attenuated, mild tortuousity attenuated, mild tortuousity   Periphery Attached, No heme  Attached, No heme            Refraction     Wearing Rx       Sphere   Right  None   Left None            IMAGING AND PROCEDURES  Imaging and Procedures for _0 @  OCT, Retina - OU - Both Eyes       Right Eye Quality was good. Central Foveal Thickness: 242. Progression has been stable. Findings include no IRF, normal foveal contour, no SRF.   Left Eye Quality was good. Central Foveal Thickness: 254. Progression has been stable. Findings include normal foveal contour, no IRF, no SRF.   Notes *Images captured and stored on drive  Diagnosis / Impression:  NFP, no IRF/SRF OU No DME OU  Clinical management:  See below  Abbreviations: NFP - Normal foveal profile. CME - cystoid macular edema. PED - pigment epithelial detachment. IRF - intraretinal fluid. SRF - subretinal fluid. EZ - ellipsoid zone. ERM - epiretinal membrane. ORA - outer retinal atrophy. ORT - outer retinal tubulation. SRHM - subretinal hyper-reflective material            ASSESSMENT/PLAN:    ICD-10-CM   1. Diabetes mellitus type 2 without retinopathy ([place])  E11.9     2. Retinal edema  H35.81 OCT, Retina - OU - Both Eyes    3. Essential hypertension  I10     4. Hypertensive retinopathy of both eyes  H35.033     5. Combined forms of age-related cataract of both eyes  H25.813     6. Glaucoma suspect of both eyes  H40.003      1. Diabetes mellitus, type 2 without retinopathy  - pts A1c was 7.6 on 04.26.21  - tr MA noted OS on exam  - The incidence, risk factors for progression, natural history and treatment options for diabetic retinopathy  were discussed with patient.    - The need for close monitoring of blood glucose, blood pressure, and serum lipids, avoiding cigarette or any type of tobacco, and the need for long term follow up was also  discussed with patient.  - continue annual diabetic checks with Dr. Katy Fitch  - f/u here prn  2. No retinal edema on exam or OCT  3,4. Hypertensive retinopathy OU  - discussed importance of tight BP control  - monitor  5. Mixed form age related cataract OU  - The symptoms of cataract, surgical options, and treatments and risks were discussed with patient.  - discussed diagnosis and progression  - under the expert management of Dr. Midge Aver  6. Glaucoma Suspect OU  - IOP today: 24 OU  - +cupping   - under the expert management of Dr. Midge Aver   Ophthalmic Meds Ordered this visit:  No orders of the defined types were placed in this encounter.      Return if symptoms worsen or fail to improve.  There are no Patient Instructions on file for this visit.  This document serves as a record of services personally performed by Gardiner Sleeper, MD, PhD. It was created on their behalf by Estill Bakes, COT an ophthalmic technician. The creation of this record is the provider's dictation and/or activities during the visit.    Electronically signed by: Estill Bakes, COT 9.26.22 @ 1:27 AM   Gardiner Sleeper, M.D., Ph.D. Diseases & Surgery of the Retina and [place]  I have reviewed the above documentation for accuracy and completeness, and I agree with the above. Gardiner Sleeper, M.D., Ph.D. 04/07/21 1:27 AM  Abbreviations: M myopia (nearsighted); A astigmatism; H hyperopia (farsighted); P presbyopia;  Mrx spectacle prescription;  CTL contact lenses; OD right eye; OS left eye; OU both eyes  XT exotropia; ET esotropia; PEK punctate epithelial keratitis; PEE punctate epithelial erosions; DES dry eye syndrome; MGD meibomian gland dysfunction; ATs artificial tears; PFAT's preservative free artificial tears; [place] nuclear sclerotic cataract; PSC posterior subcapsular cataract; ERM epi-retinal membrane; PVD posterior vitreous detachment; RD retinal detachment;  DM diabetes mellitus; DR diabetic retinopathy; NPDR non-proliferative diabetic retinopathy; PDR proliferative diabetic retinopathy; CSME clinically significant macular edema; DME diabetic macular edema; dbh dot blot hemorrhages; CWS cotton wool spot; POAG primary open angle glaucoma; C/D cup-to-disc ratio; HVF humphrey visual field; GVF goldmann visual field; OCT optical coherence tomography; IOP intraocular pressure; BRVO Branch retinal vein occlusion; CRVO central retinal vein occlusion; CRAO central retinal artery occlusion; BRAO branch retinal artery occlusion; RT retinal tear; SB scleral buckle; PPV pars plana vitrectomy; VH Vitreous hemorrhage; PRP panretinal laser photocoagulation; IVK intravitreal kenalog; VMT vitreomacular traction; MH Macular hole;  NVD neovascularization of the disc; NVE neovascularization elsewhere; AREDS age related eye disease study; ARMD age related macular degeneration; POAG primary open angle glaucoma; EBMD epithelial/anterior basement membrane dystrophy; ACIOL anterior chamber intraocular lens; IOL intraocular lens; PCIOL posterior chamber intraocular lens; Phaco/IOL phacoemulsification with intraocular lens placement; [place] photorefractive keratectomy; LASIK laser assisted in situ keratomileusis; HTN hypertension; DM diabetes mellitus; COPD chronic obstructive pulmonary disease

## 2021-04-05 ENCOUNTER — Ambulatory Visit (INDEPENDENT_AMBULATORY_CARE_PROVIDER_SITE_OTHER): Payer: Medicare Other | Admitting: Ophthalmology

## 2021-04-05 ENCOUNTER — Other Ambulatory Visit: Payer: Self-pay

## 2021-04-05 DIAGNOSIS — I10 Essential (primary) hypertension: Secondary | ICD-10-CM | POA: Diagnosis not present

## 2021-04-05 DIAGNOSIS — H40003 Preglaucoma, unspecified, bilateral: Secondary | ICD-10-CM

## 2021-04-05 DIAGNOSIS — H25813 Combined forms of age-related cataract, bilateral: Secondary | ICD-10-CM | POA: Diagnosis not present

## 2021-04-05 DIAGNOSIS — H35033 Hypertensive retinopathy, bilateral: Secondary | ICD-10-CM

## 2021-04-05 DIAGNOSIS — E119 Type 2 diabetes mellitus without complications: Secondary | ICD-10-CM

## 2021-04-05 DIAGNOSIS — H3581 Retinal edema: Secondary | ICD-10-CM

## 2021-04-07 ENCOUNTER — Encounter (INDEPENDENT_AMBULATORY_CARE_PROVIDER_SITE_OTHER): Payer: Self-pay | Admitting: Ophthalmology

## 2021-04-08 ENCOUNTER — Other Ambulatory Visit: Payer: Self-pay | Admitting: *Deleted

## 2021-04-08 NOTE — Patient Outreach (Signed)
[facility]) Care Management  04/08/2021  Jerry Serrano Mey Nov 30, 1950 [id number]   RN Health Coach attempted follow up outreach call to patient.  Patient was unavailable. HIPPA compliance voicemail message left with return callback number.  Plan: RN will call patient again within 30 days.  [place] Care Management [phone]

## 2021-04-24 ENCOUNTER — Other Ambulatory Visit: Payer: Self-pay | Admitting: Family Medicine

## 2021-04-24 DIAGNOSIS — M19049 Primary osteoarthritis, unspecified hand: Secondary | ICD-10-CM

## 2021-04-24 NOTE — Telephone Encounter (Signed)
Requested medication (s) are due for refill today: yes  Requested medication (s) are on the active medication list: yes  Last refill:  12/27/20 #60 2 RF  Future visit scheduled: no  Notes to clinic:  needs repeat Hgb and Cr   Requested Prescriptions  Pending Prescriptions Disp Refills   diclofenac (VOLTAREN) 75 MG EC tablet 60 tablet 2    Sig: TAKE 1 TABLET (75 MG TOTAL) BY MOUTH 2 (TWO) TIMES DAILY.     Analgesics:  NSAIDS Failed - 04/24/2021  9:48 AM      Failed - Cr in normal range and within 360 days    Creat  Date Value Ref Range Status  09/05/2016 0.90 0.70 - 1.25 mg/dL Final    Comment:      For patients > or = 72 years of age: The upper reference limit for Creatinine is approximately 13% higher for people identified as African-American.      Creatinine, Ser  Date Value Ref Range Status  11/16/2020 1.33 (H) 0.76 - 1.27 mg/dL Final   Creatinine, Urine  Date Value Ref Range Status  09/05/2016 378 (H) 20 - 370 mg/dL Final    Comment:    Result repeated and verified. Result confirmed by automatic dilution.           Failed - HGB in normal range and within 360 days    Hemoglobin  Date Value Ref Range Status  07/18/2019 12.6 (L) 13.0 - 17.7 g/dL Final          Passed - Patient is not pregnant      Passed - Valid encounter within last 12 months    Recent Outpatient Visits           2 months ago Type 2 diabetes mellitus with diabetic polyneuropathy, with long-term current use of insulin ([place])   [place], [place], MD   5 months ago Type 2 diabetes mellitus with diabetic polyneuropathy, with long-term current use of insulin ([place])   [place], Adel, MD   6 months ago Type 2 diabetes mellitus with diabetic polyneuropathy, with long-term current use of insulin ([place])   [place], Brittain L, RPH-CPP   7 months ago Type 2 diabetes mellitus  with diabetic polyneuropathy, with long-term current use of insulin [facility])   [place], Taaj L, RPH-CPP   8 months ago Type 2 diabetes mellitus with diabetic polyneuropathy, with long-term current use of insulin [facility])   [place], RPH-CPP

## 2021-04-25 ENCOUNTER — Other Ambulatory Visit: Payer: Self-pay

## 2021-04-25 ENCOUNTER — Other Ambulatory Visit: Payer: Self-pay | Admitting: Family Medicine

## 2021-04-25 MED ORDER — ACCU-CHEK AVIVA PLUS W/DEVICE KIT
PACK | 0 refills | Status: DC
  Filled 2021-04-25: qty 1, fill #0

## 2021-04-25 MED ORDER — DICLOFENAC SODIUM 75 MG PO TBEC
DELAYED_RELEASE_TABLET | Freq: Two times a day (BID) | ORAL | 1 refills | Status: DC
  Filled 2021-04-25: qty 60, 30d supply, fill #0
  Filled 2021-06-28: qty 60, 30d supply, fill #1

## 2021-04-25 NOTE — Telephone Encounter (Signed)
Requested Prescriptions  Pending Prescriptions Disp Refills  . Blood Glucose Monitoring Suppl (ACCU-CHEK AVIVA PLUS) w/Device KIT 1 kit 0    Sig: USE AS DIRECTED 4 TIMES DAILY AFTER MEALS AND AT BEDTIME.     Endocrinology: Diabetes - Testing Supplies Passed - 04/25/2021 10:54 AM      Passed - Valid encounter within last 12 months    Recent Outpatient Visits          2 months ago Type 2 diabetes mellitus with diabetic polyneuropathy, with long-term current use of insulin (H[facility])   [facility] [place], Enobong, MD   5 months ago Type 2 diabetes mellitus with diabetic polyneuropathy, with long-term current use of insulin (HCC)   [facility] Newlin, Enobong, MD   6 months ago Type 2 diabetes mellitus with diabetic polyneuropathy, with long-term current use of insulin (HCC)   [facility] [place] Aus[place]all, Samaj L, RPH-CPP   7 months ago Type 2 diabetes mellitus with diabetic polyneuropathy, with long-term current use of insulin (HCC)   [facility] Van Ausdall, Idrissa L, RPH-CPP   8 months ago Type 2 diabetes mellitus with diabetic polyneuropathy, with long-term current use of insulin (HCC)   [facility] Van Ausdall, Brysten L, RPH-CPP              

## 2021-04-26 ENCOUNTER — Other Ambulatory Visit: Payer: Self-pay | Admitting: Pharmacist

## 2021-04-26 ENCOUNTER — Other Ambulatory Visit: Payer: Self-pay

## 2021-04-26 DIAGNOSIS — E11649 Type 2 diabetes mellitus with hypoglycemia without coma: Secondary | ICD-10-CM

## 2021-04-26 MED ORDER — ACCU-CHEK GUIDE VI STRP
ORAL_STRIP | 2 refills | Status: DC
  Filled 2021-04-26: qty 100, 33d supply, fill #0

## 2021-04-26 MED ORDER — ACCU-CHEK AVIVA PLUS VI STRP
ORAL_STRIP | 2 refills | Status: DC
  Filled 2021-04-26: qty 100, 33d supply, fill #0

## 2021-05-08 MED FILL — Pantoprazole Sodium EC Tab 40 MG (Base Equiv): ORAL | 30 days supply | Qty: 30 | Fill #3 | Status: AC

## 2021-05-08 MED FILL — Atorvastatin Calcium Tab 40 MG (Base Equivalent): ORAL | 30 days supply | Qty: 30 | Fill #5 | Status: AC

## 2021-05-09 ENCOUNTER — Other Ambulatory Visit: Payer: Self-pay

## 2021-05-09 ENCOUNTER — Ambulatory Visit: Payer: Self-pay | Admitting: *Deleted

## 2021-05-23 ENCOUNTER — Other Ambulatory Visit: Payer: Self-pay

## 2021-06-03 ENCOUNTER — Other Ambulatory Visit: Payer: Self-pay | Admitting: Family Medicine

## 2021-06-03 DIAGNOSIS — E782 Mixed hyperlipidemia: Secondary | ICD-10-CM

## 2021-06-04 MED ORDER — LOSARTAN POTASSIUM-HCTZ 100-25 MG PO TABS
ORAL_TABLET | ORAL | 0 refills | Status: DC
  Filled 2021-06-04: qty 90, 90d supply, fill #0

## 2021-06-04 MED ORDER — ATORVASTATIN CALCIUM 40 MG PO TABS
ORAL_TABLET | Freq: Every day | ORAL | 1 refills | Status: DC
  Filled 2021-06-04: qty 90, 90d supply, fill #0
  Filled 2021-06-11 – 2021-09-07 (×2): qty 90, 90d supply, fill #1
  Filled 2021-09-07: qty 90, 90d supply, fill #0

## 2021-06-04 NOTE — Telephone Encounter (Signed)
Requested Prescriptions  Pending Prescriptions Disp Refills  . losartan-hydrochlorothiazide (HYZAAR) 100-25 MG tablet 90 tablet 0    Sig: TAKE 1 TABLET BY MOUTH DAILY. *DISCONTINUE LOSARTAN 50/12.5*     Cardiovascular: ARB + Diuretic Combos Failed - 06/03/2021 10:41 AM      Failed - K in normal range and within 180 days    Potassium  Date Value Ref Range Status  11/16/2020 4.9 3.5 - 5.2 mmol/L Final         Failed - Na in normal range and within 180 days    Sodium  Date Value Ref Range Status  11/16/2020 143 134 - 144 mmol/L Final         Failed - Cr in normal range and within 180 days    Creat  Date Value Ref Range Status  09/05/2016 0.90 0.70 - 1.25 mg/dL Final    Comment:      For patients > or = 72 years of age: The upper reference limit for Creatinine is approximately 13% higher for people identified as African-American.      Creatinine, Ser  Date Value Ref Range Status  11/16/2020 1.33 (H) 0.76 - 1.27 mg/dL Final   Creatinine, Urine  Date Value Ref Range Status  09/05/2016 378 (H) 20 - 370 mg/dL Final    Comment:    Result repeated and verified. Result confirmed by automatic dilution.          Failed - Ca in normal range and within 180 days    Calcium  Date Value Ref Range Status  11/16/2020 10.0 8.6 - 10.2 mg/dL Final         Failed - Last BP in normal range    BP Readings from Last 1 Encounters:  11/16/20 (!) 143/74         Passed - Patient is not pregnant      Passed - Valid encounter within last 6 months    Recent Outpatient Visits          3 months ago Type 2 diabetes mellitus with diabetic polyneuropathy, with long-term current use of insulin ([place])   [place], Webb, MD   6 months ago Type 2 diabetes mellitus with diabetic polyneuropathy, with long-term current use of insulin ([place])   [place], [place], MD   8 months ago Type 2 diabetes mellitus with diabetic  polyneuropathy, with long-term current use of insulin ([place])   [place], Daeron L, RPH-CPP   9 months ago Type 2 diabetes mellitus with diabetic polyneuropathy, with long-term current use of insulin [facility])   [place], Buck L, RPH-CPP   9 months ago Type 2 diabetes mellitus with diabetic polyneuropathy, with long-term current use of insulin [facility])   [place] Ausdall, Jarome Matin, RPH-CPP             Signed Prescriptions Disp Refills   atorvastatin (LIPITOR) 40 MG tablet 90 tablet 1    Sig: TAKE 1 TABLET (40 MG TOTAL) BY MOUTH DAILY.     Cardiovascular:  Antilipid - Statins Passed - 06/03/2021 10:41 AM      Passed - Total Cholesterol in normal range and within 360 days    Cholesterol, Total  Date Value Ref Range Status  11/16/2020 131 100 - 199 mg/dL Final         Passed - LDL  in normal range and within 360 days    LDL Chol Calc (NIH)  Date Value Ref Range Status  11/16/2020 73 0 - 99 mg/dL Final         Passed - HDL in normal range and within 360 days    HDL  Date Value Ref Range Status  11/16/2020 44 >39 mg/dL Final         Passed - Triglycerides in normal range and within 360 days    Triglycerides  Date Value Ref Range Status  11/16/2020 65 0 - 149 mg/dL Final         Passed - Patient is not pregnant      Passed - Valid encounter within last 12 months    Recent Outpatient Visits          3 months ago Type 2 diabetes mellitus with diabetic polyneuropathy, with long-term current use of insulin ([place])   [place], Rogers, MD   6 months ago Type 2 diabetes mellitus with diabetic polyneuropathy, with long-term current use of insulin ([place])   [place], B[place], MD   8 months ago Type 2 diabetes mellitus with diabetic polyneuropathy, with long-term current use of insulin  ([place])   [place], Keontae L, RPH-CPP   9 months ago Type 2 diabetes mellitus with diabetic polyneuropathy, with long-term current use of insulin [facility])   [place], Jonh L, RPH-CPP   9 months ago Type 2 diabetes mellitus with diabetic polyneuropathy, with long-term current use of insulin [facility])   [place], RPH-CPP

## 2021-06-04 NOTE — Telephone Encounter (Signed)
Requested Prescriptions  Pending Prescriptions Disp Refills  . atorvastatin (LIPITOR) 40 MG tablet 90 tablet 1    Sig: TAKE 1 TABLET (40 MG TOTAL) BY MOUTH DAILY.     Cardiovascular:  Antilipid - Statins Passed - 06/03/2021 10:41 AM      Passed - Total Cholesterol in normal range and within 360 days    Cholesterol, Total  Date Value Ref Range Status  11/16/2020 131 100 - 199 mg/dL Final         Passed - LDL in normal range and within 360 days    LDL Chol Calc (NIH)  Date Value Ref Range Status  11/16/2020 73 0 - 99 mg/dL Final         Passed - HDL in normal range and within 360 days    HDL  Date Value Ref Range Status  11/16/2020 44 >39 mg/dL Final         Passed - Triglycerides in normal range and within 360 days    Triglycerides  Date Value Ref Range Status  11/16/2020 65 0 - 149 mg/dL Final         Passed - Patient is not pregnant      Passed - Valid encounter within last 12 months    Recent Outpatient Visits          3 months ago Type 2 diabetes mellitus with diabetic polyneuropathy, with long-term current use of insulin ([place])   [place], [place], MD   6 months ago Type 2 diabetes mellitus with diabetic polyneuropathy, with long-term current use of insulin ([place])   [place], [place], MD   8 months ago Type 2 diabetes mellitus with diabetic polyneuropathy, with long-term current use of insulin ([place])   [place], Keaun L, RPH-CPP   9 months ago Type 2 diabetes mellitus with diabetic polyneuropathy, with long-term current use of insulin [facility])   [place], Lynell L, RPH-CPP   9 months ago Type 2 diabetes mellitus with diabetic polyneuropathy, with long-term current use of insulin [facility])   [place], RPH-CPP             .  losartan-hydrochlorothiazide (HYZAAR) 100-25 MG tablet 90 tablet     Sig: TAKE 1 TABLET BY MOUTH DAILY. *DISCONTINUE LOSARTAN 50/12.5*     Cardiovascular: ARB + Diuretic Combos Failed - 06/03/2021 10:41 AM      Failed - K in normal range and within 180 days    Potassium  Date Value Ref Range Status  11/16/2020 4.9 3.5 - 5.2 mmol/L Final         Failed - Na in normal range and within 180 days    Sodium  Date Value Ref Range Status  11/16/2020 143 134 - 144 mmol/L Final         Failed - Cr in normal range and within 180 days    Creat  Date Value Ref Range Status  09/05/2016 0.90 0.70 - 1.25 mg/dL Final    Comment:      For patients > or = 72 years of age: The upper reference limit for Creatinine is approximately 13% higher for people identified as African-American.      Creatinine, Ser  Date Value Ref Range Status  11/16/2020 1.33 (H) 0.76 - 1.27 mg/dL Final   Creatinine, Urine  Date Value  Ref Range Status  09/05/2016 378 (H) 20 - 370 mg/dL Final    Comment:    Result repeated and verified. Result confirmed by automatic dilution.          Failed - Ca in normal range and within 180 days    Calcium  Date Value Ref Range Status  11/16/2020 10.0 8.6 - 10.2 mg/dL Final         Failed - Last BP in normal range    BP Readings from Last 1 Encounters:  11/16/20 (!) 143/74         Passed - Patient is not pregnant      Passed - Valid encounter within last 6 months    Recent Outpatient Visits          3 months ago Type 2 diabetes mellitus with diabetic polyneuropathy, with long-term current use of insulin ([place])   [place], Gurley, MD   6 months ago Type 2 diabetes mellitus with diabetic polyneuropathy, with long-term current use of insulin ([place])   [place], Lamar, MD   8 months ago Type 2 diabetes mellitus with diabetic polyneuropathy, with long-term current use of insulin ([place])   [place], Cove L, RPH-CPP   9 months ago Type 2 diabetes mellitus with diabetic polyneuropathy, with long-term current use of insulin [facility])   [place], Knoah L, RPH-CPP   9 months ago Type 2 diabetes mellitus with diabetic polyneuropathy, with long-term current use of insulin [facility])   [place], RPH-CPP

## 2021-06-06 ENCOUNTER — Other Ambulatory Visit: Payer: Self-pay

## 2021-06-13 ENCOUNTER — Other Ambulatory Visit: Payer: Self-pay

## 2021-06-13 ENCOUNTER — Ambulatory Visit: Payer: Self-pay | Admitting: *Deleted

## 2021-06-13 ENCOUNTER — Ambulatory Visit: Payer: Self-pay

## 2021-06-13 NOTE — Telephone Encounter (Signed)
Pt called back to recheck BP but pt states he is on the way to the office now for BP check and medication. No other concerns/questions needed. Pt was already triaged by another NT.

## 2021-06-13 NOTE — Telephone Encounter (Signed)
Pt called in concerned about his BP being high.   See triage notes for readings.   He is out of his BP medication and had a question about it.    The Hyzaar looks like the dose of 50/12.5 mg was discontinued and a new dose of Hyzaar 100/25 mg was ordered on 06/04/2021.  He c/o a headache but denies chest pain or shortness of breath.     Pt is going to come by [place] this afternoon when he picks up his daughter  and get his new prescription and make sure he understands the change.  I went over the s/s to go to the ED if they occur from the care advice.   He verbalized understanding.

## 2021-06-13 NOTE — Telephone Encounter (Signed)
Reason for Disposition  Ran out of BP medications  Answer Assessment - Initial Assessment Questions 1. BLOOD PRESSURE: "What is the blood pressure?" "Did you take at least two measurements 5 minutes apart?"     Pt calling in c/o BP being elevated.  186/102 yesterday.   Today 178/92. All my medicines have run out.    2. ONSET: "When did you take your blood pressure?"     Today 178/92 3. HOW: "How did you obtain the blood pressure?" (e.g., visiting nurse, automatic home BP monitor)     BP machine at home 4. HISTORY: "Do you have a history of high blood pressure?"     Yes 5. MEDICATIONS: "Are you taking any medications for blood pressure?" "Have you missed any doses recently?"     Yes but I'm am out. 6. OTHER SYMPTOMS: "Do you have any symptoms?" (e.g., headache, chest pain, blurred vision, difficulty breathing, weakness)     I have a headache and I'm tired.   My heart is beating too fast.    He is going to come by [place] this afternoon to pick up his BP medication.   The dose was changed on the Hyzaar to 100/25mg .    It was 50/12.5mg .   7. PREGNANCY: "Is there any chance you are pregnant?" "When was your last menstrual period?"     N/A  Protocols used: Blood Pressure - High-A-AH

## 2021-06-13 NOTE — Telephone Encounter (Signed)
Pt called in stating that his BP is elevated. BP was taken at 1000 via automatic BP cuff and BP was 176/92. Pt has hx of HTN and reports has not taken BP med yet today. Advised pt to take BP med and will call back in 1 hr to recheck BP. Pt was needing to call back anyways d/t someone else being on the other line. Will call pt back.   Reason for Disposition  Systolic BP  >= 403 OR Diastolic >= 709  Answer Assessment - Initial Assessment Questions 1. BLOOD PRESSURE: "What is the blood pressure?" "Did you take at least two measurements 5 minutes apart?"     176/92 2. ONSET: "When did you take your blood pressure?"     1000 3. HOW: "How did you obtain the blood pressure?" (e.g., visiting nurse, automatic home BP monitor)     Automatic cuff  4. HISTORY: "Do you have a history of high blood pressure?"     Yes 5. MEDICATIONS: "Are you taking any medications for blood pressure?" "Have you missed any doses recently?"     Haven't take medicine today 6. OTHER SYMPTOMS: "Do you have any symptoms?" (e.g., headache, chest pain, blurred vision, difficulty breathing, weakness)     No 7. PREGNANCY: "Is there any chance you are pregnant?" "When was your last menstrual period?"     NA  Protocols used: Blood Pressure - High-A-AH

## 2021-06-28 ENCOUNTER — Other Ambulatory Visit: Payer: Self-pay

## 2021-06-28 ENCOUNTER — Other Ambulatory Visit: Payer: Self-pay | Admitting: Family Medicine

## 2021-06-29 ENCOUNTER — Other Ambulatory Visit: Payer: Self-pay

## 2021-07-07 ENCOUNTER — Other Ambulatory Visit: Payer: Self-pay

## 2021-07-08 ENCOUNTER — Other Ambulatory Visit: Payer: Self-pay

## 2021-07-13 ENCOUNTER — Other Ambulatory Visit: Payer: Self-pay | Admitting: *Deleted

## 2021-07-13 NOTE — Patient Outreach (Signed)
[facility]) Care Management  07/13/2021  Jerry Serrano 02/18/1951 [id number]   RN Health Coach telephone call to patient.  [employer] compliance verified. Patient was driving and will call back.   Plan: Patient will call RN Health Coach back    [place] Management [phone]

## 2021-07-15 ENCOUNTER — Other Ambulatory Visit: Payer: Self-pay | Admitting: *Deleted

## 2021-07-15 NOTE — Patient Outreach (Signed)
[facility]) Care Management RN Health Coach Note   07/15/2021 Name:  Jerry Serrano MRN:  [id number] DOB:  1950/12/10  Summary: Patient had not monitored his blood sugar today. Patient is concerned about his blood pressure. He had ran out of his medications. He had not gotten refilled due to pharmacy was doing the refill call to physician. Patient was started on higher dose blood pressure medication . Patient has started monitoring and documenting his blood pressures.  Last blood pressure 132/77.  Recommendations/Changes made from today's visit: Monitor blood pressure daily and document Monitor blood sugars daily and document Monitor the sodium in your diet   Subjective: Jerry Serrano is an 71 y.o. year old male who is a primary patient of Jerry Rakes, MD. The care management team was consulted for assistance with care management and/or care coordination needs.    RN Health Coach completed Telephone Visit today.   Objective:  Medications Reviewed Today     Reviewed by Bernarda Caffey, MD (Physician) on 04/07/21 at [facility] List Status: <None>   Medication Order Taking? Sig Documenting Provider Last Dose Status Informant  0.9 %  sodium chloride infusion [id number]   Nelida Meuse III, MD  Active   amLODipine (NORVASC) 10 MG tablet [id number] No Take 1 tablet (10 mg total) by mouth daily. Jerry Rakes, MD Taking Expired 01/24/21 2359   aspirin EC 81 MG tablet [id number] No Take 1 tablet (81 mg total) by mouth daily. Jerry Rakes, MD Taking Active   atorvastatin (LIPITOR) 40 MG tablet [id number] No TAKE 1 TABLET (40 MG TOTAL) BY MOUTH DAILY. Jerry Rakes, MD Taking Active   betamethasone dipropionate 0.05 % cream [id number] No APPLY EXTERNALLY TO THE AFFECTED AREA TWICE DAILY Maximiano Coss, NP Taking Active   Blood Glucose Monitoring Suppl (ACCU-CHEK AVIVA PLUS) w/Device KIT [id number] No USE AS DIRECTED 4 TIMES DAILY AFTER MEALS AND AT BEDTIME. Jerry Rakes, MD Taking Active   Blood Glucose Monitoring Suppl (ACCU-CHEK GUIDE ME) w/Device KIT [id number] No USE AS DIRECTED 4 TIMES DAILY AFTER MEALS AND AT BEDTIME. Jerry Rakes, MD Taking Active   diclofenac (VOLTAREN) 75 MG EC tablet [id number] No TAKE 1 TABLET (75 MG TOTAL) BY MOUTH 2 (TWO) TIMES DAILY. Jerry Rakes, MD Taking Active   gabapentin (NEURONTIN) 300 MG capsule [id number] No Take 1 capsule (300 mg total) by mouth daily. Jerry Rakes, MD Taking Active   glucose blood test strip [id number] No USE 1 STRIP TO CHECK GLUCOSE THREE TIMES DAILY FOR  BLOOD  SUGAR Jerry Rakes, MD Taking Active   insulin glargine (LANTUS) 100 UNIT/ML Solostar Pen [id number] No Inject 25 Units into the skin daily. Jerry Rakes, MD Taking Active   ketoconazole (NIZORAL) 2 % cream [id number] No Apply 1 application topically daily. Maximiano Coss, NP Taking Active   Lancet Devices (ACCU-CHEK [place]) lancets [id number] No Use as instructed Tresa Garter, MD Taking Active   losartan-hydrochlorothiazide (HYZAAR) 100-25 MG tablet [id number] No TAKE 1 TABLET BY MOUTH DAILY. *DISCONTINUE LOSARTAN 50/12.5Margarita Rana, [place], MD Taking Active   metFORMIN (GLUCOPHAGE) 1000 MG tablet [id number] No TAKE 1 AND 1/2 TABLETS (1500MG) BY MOUTH IN THE MORNING AND 1 TABLET (1000MG) IN THE EVENING. Jerry Rakes, MD Taking Active   MICROLET LANCETS MISC [id number] No Test 3 times per day Tresa Garter, MD Taking Active   olopatadine (PATANOL) 0.1 % ophthalmic solution [id number] No Place 1 drop into both eyes 2 (two) times daily. [provider] Taking Active  pantoprazole (PROTONIX) 40 MG tablet [id number] No TAKE 1 TABLET BY MOUTH DAILY.  Patient not taking: No sig reported   Jerry Rakes, MD Not Taking Active   sildenafil (VIAGRA) 50 MG tablet [id number] No Take 1 tablet (50 mg total) by mouth daily as needed for erectile dysfunction.  Patient not taking: No sig reported   Jerry Rakes, MD Not  Taking Active              SDOH:  (Social Determinants of Health) assessments and interventions performed:  SDOH Interventions    Flowsheet Row Most Recent Value  SDOH Interventions   Food Insecurity Interventions Intervention Not Indicated  Housing Interventions Intervention Not Indicated  Transportation Interventions Intervention Not Indicated       Care Plan  Review of patient past medical history, allergies, medications, health status, including review of consultants reports, laboratory and other test data, was performed as part of comprehensive evaluation for care management services.   Care Plan : Diabetes Type 2 (Adult)  Updates made by Verlin Grills, RN since 07/15/2021 12:00 AM     Problem: Glycemic Management (Diabetes, Type 2) Resolved 07/15/2021  Priority: Medium  Onset Date: 08/11/2020  Note:   [id number] Resolving due to duplicate goal     Long-Range Goal: Glycemic Management Optimized Completed 07/15/2021  Start Date: 08/11/2020  Expected End Date: 07/08/2021  Recent Progress: Not on track  Priority: High  Note:   Evidence-based guidance:  Anticipate A1C testing (point-of-care) every 3 to 6 months based on goal attainment.  Review mutually-set A1C goal or target range.  Anticipate use of antihyperglycemic with or without insulin and periodic adjustments; consider active involvement of pharmacist.  Provide medical nutrition therapy and development of individualized eating.  Compare self-reported symptoms of hypo or hyperglycemia to blood glucose levels, diet and fluid intake, current medications, psychosocial and physiologic stressors, change in activity and barriers to care adherence.  Promote self-monitoring of blood glucose levels.  Assess and address barriers to management plan, such as food insecurity, age, developmental ability, depression, anxiety, fear of hypoglycemia or weight gain, as well as medication cost, side effects and complicated regimen.   Consider referral to community-based diabetes education program, visiting nurse, community health worker or health coach.  Encourage regular dental care for treatment of periodontal disease; refer to dental provider when needed.   Notes:     Task: Alleviate Barriers to Glycemic Management Completed 07/15/2021  Due Date: 03/08/2021  Note:   Care Management Activities:    - barriers to adherence to treatment plan identified - blood glucose monitoring encouraged - resources required to improve adherence to care identified - self-awareness of signs/symptoms of hypo or hyperglycemia encouraged - use of blood glucose monitoring log promoted    Notes:  Provided a calendar book for documentation    Problem: Disease Progression (Diabetes, Type 2) Resolved 07/15/2021  Priority: Medium  Onset Date: 08/12/2020  Note:   [id number] Resolving due to duplicate goal     Goal: Disease Progression Prevented or Minimized Completed 07/15/2021  Start Date: 08/11/2020  Expected End Date: 07/08/2021  Recent Progress: Not on track  Priority: High  Note:   Evidence-based guidance:  Prepare patient for laboratory and diagnostic exams based on risk and presentation.  Encourage lifestyle changes, such as increased intake of plant-based foods, stress reduction, consistent physical activity and smoking cessation to prevent long-term complications and chronic disease.   Individualize activity and exercise recommendations while considering potential limitations, such as neuropathy, retinopathy or the  ability to prevent hyperglycemia or hypoglycemia.   Prepare patient for use of pharmacologic therapy that may include antihypertensive, analgesic, prostaglandin E1 with periodic adjustments, based on presenting chronic condition and laboratory results.  Assess signs/symptoms and risk factors for hypertension, sleep-disordered breathing, neuropathy (including changes in gait and balance), retinopathy, nephropathy and sexual  dysfunction.  Address pregnancy planning and contraceptive choice, especially when prescribing antihypertensive or statin.  Ensure completion of annual comprehensive foot exam and dilated eye exam.   Implement additional individualized goals and interventions based on identified risk factors.  Prepare patient for consultation or referral for specialist care, such as ophthalmology, neurology, cardiology, podiatry, nephrology or perinatology.   Notes:     Task: Monitor and Manage Follow-up for Comorbidities Completed 07/15/2021  Due Date: 03/08/2021  Note:   Care Management Activities:    - activity based on tolerance and functional limitations encouraged - completion of annual dilated eye exam confirmed - healthy lifestyle promoted - reduction of sedentary activity encouraged    Notes:  [id number] Patient is having hypoglycemic episodes    Care Plan : [place] of Care  Updates made by Itzamara Casas, Eppie Gibson, RN since 07/15/2021 12:00 AM     Problem: Knowledge Deficit Related to Diabetes , HTN and Care Coordination Needs   Priority: High     Long-Range Goal: velopment Plan of Care for Management of Diabetes,and HTN   Start Date: 07/15/2021  Expected End Date: 07/08/2022  Priority: High  Note:   Current Barriers:  Knowledge Deficits related to plan of care for management of HTN and DMII   RNCM Clinical Goal(s):  Patient will verbalize understanding of plan for management of HTN and DMII as evidenced by continuation monitoring blood pressure and blood glucose. Adhering to a diabetic and low sodium diet  through collaboration with RN Care manager, provider, and care team.   Interventions: Inter-disciplinary care team collaboration (see longitudinal plan of care) Evaluation of current treatment plan related to  self management and patient's adherence to plan as established by provider  Patient Goals/Self-Care Activities: Take medications as prescribed   Attend all scheduled  provider appointments Call pharmacy for medication refills 3-7 days in advance of running out of medications Perform all self care activities independently  Perform IADL's (shopping, preparing meals, housekeeping, managing finances) independently Call provider office for new concerns or questions  call the Suicide and Crisis Lifeline: 988 if experiencing a Mental Health or [place]  check blood sugar at prescribed times: once daily check feet daily for cuts, sores or redness take the blood sugar meter to all doctor visits trim toenails straight across drink 6 to 8 glasses of water each day set a realistic goal check blood pressure daily keep a blood pressure log take blood pressure log to all doctor appointments call doctor for signs and symptoms of high blood pressure develop an action plan for high blood pressure keep all doctor appointments take medications for blood pressure exactly as prescribed report new symptoms to your doctor limit salt intake to 2300 mg/day        Plan: Telephone follow up appointment with care management team member scheduled for:  September 19, 2021 RN provided education on Hypertension   [place] Management [phone]

## 2021-07-15 NOTE — Patient Instructions (Addendum)
Visit Information  Thank you for taking time to visit with me today. Please don't hesitate to contact me if I can be of assistance to you before our next scheduled telephone appointment.  Following are the goals we discussed today:  Current Barriers:  Knowledge Deficits related to plan of care for management of HTN and DMII   RNCM Clinical Goal(s):  Patient will verbalize understanding of plan for management of HTN and DMII as evidenced by continuation monitoring blood pressure and blood glucose. Adhering to a diabetic and low sodium diet through collaboration with RN Care manager, provider, and care team.   Interventions: Inter-disciplinary care team collaboration (see longitudinal plan of care) Evaluation of current treatment plan related to  self management and patient's adherence to plan as established by provider  Patient Goals/Self-Care Activities: Take medications as prescribed   Attend all scheduled provider appointments Call pharmacy for medication refills 3-7 days in advance of running out of medications Perform all self care activities independently  Perform IADL's (shopping, preparing meals, housekeeping, managing finances) independently Call provider office for new concerns or questions  call the Suicide and Crisis Lifeline: 988 if experiencing a Mental Health or [place]  check blood sugar at prescribed times: once daily check feet daily for cuts, sores or redness take the blood sugar meter to all doctor visits trim toenails straight across drink 6 to 8 glasses of water each day set a realistic goal check blood pressure daily keep a blood pressure log take blood pressure log to all doctor appointments call doctor for signs and symptoms of high blood pressure develop an action plan for high blood pressure keep all doctor appointments take medications for blood pressure exactly as prescribed report new symptoms to your doctor limit salt intake to 2300  mg/day  Our next appointment is by telephone on 09/2021  Please call the [facility] at [phone] if you need to cancel or reschedule your appointment.   Please call the Suicide and Crisis Lifeline: 988 if you are experiencing a Mental Health or [place] or need someone to talk to.  Patient verbalizes understanding of instructions provided today and agrees to view in [place].   Telephone follow up appointment with care management team member scheduled for: The patient has been provided with contact information for the care management team and has been advised to call with any health related questions or concerns.   SIGNATURE. [place] Care Management [phone]

## 2021-08-07 ENCOUNTER — Other Ambulatory Visit: Payer: Self-pay | Admitting: Family Medicine

## 2021-08-07 DIAGNOSIS — I10 Essential (primary) hypertension: Secondary | ICD-10-CM

## 2021-08-07 MED ORDER — AMLODIPINE BESYLATE 10 MG PO TABS
10.0000 mg | ORAL_TABLET | Freq: Every day | ORAL | 0 refills | Status: DC
  Filled 2021-08-07: qty 30, 30d supply, fill #0

## 2021-08-07 NOTE — Telephone Encounter (Signed)
Requested Prescriptions  Pending Prescriptions Disp Refills   amLODipine (NORVASC) 10 MG tablet 30 tablet 0    Sig: Take 1 tablet (10 mg total) by mouth daily.     Cardiovascular:  Calcium Channel Blockers Failed - 08/07/2021  2:39 AM      Failed - Last BP in normal range    BP Readings from Last 1 Encounters:  11/16/20 (!) 143/74         Passed - Valid encounter within last 6 months    Recent Outpatient Visits          5 months ago Type 2 diabetes mellitus with diabetic polyneuropathy, with long-term current use of insulin ([place])   [place], [place], MD   8 months ago Type 2 diabetes mellitus with diabetic polyneuropathy, with long-term current use of insulin ([place])   [place], [place], MD   10 months ago Type 2 diabetes mellitus with diabetic polyneuropathy, with long-term current use of insulin ([place])   [place], Jarmon L, RPH-CPP   11 months ago Type 2 diabetes mellitus with diabetic polyneuropathy, with long-term current use of insulin [facility])   [place], Ranson L, RPH-CPP   11 months ago Type 2 diabetes mellitus with diabetic polyneuropathy, with long-term current use of insulin [facility])   [place], RPH-CPP

## 2021-08-08 ENCOUNTER — Other Ambulatory Visit: Payer: Self-pay

## 2021-08-09 ENCOUNTER — Other Ambulatory Visit: Payer: Self-pay

## 2021-08-11 ENCOUNTER — Other Ambulatory Visit: Payer: Self-pay | Admitting: Family Medicine

## 2021-08-11 DIAGNOSIS — E1142 Type 2 diabetes mellitus with diabetic polyneuropathy: Secondary | ICD-10-CM

## 2021-08-11 NOTE — Telephone Encounter (Signed)
Requested medications are due for refill today.  yes ° °Requested medications are on the active medications list.  yes ° °Last refill. 11/16/2020 ° °Future visit scheduled.   no ° °Notes to clinic.  Failed protocol d/t abnormal /missing labs. ° ° ° °Requested Prescriptions  °Pending Prescriptions Disp Refills  ° metFORMIN (GLUCOPHAGE) 1000 MG tablet 225 tablet 1  °  Sig: TAKE 1 AND 1/2 TABLETS (1500MG) BY MOUTH IN THE MORNING AND 1 TABLET (1000MG) IN THE EVENING.  °  ° Endocrinology:  Diabetes - Biguanides Failed - 08/11/2021 11:20 PM  °  °  Failed - Cr in normal range and within 360 days  °  Creat  °Date Value Ref Range Status  °09/05/2016 0.90 0.70 - 1.25 mg/dL Final  °  Comment:  °    °For patients > or = 73 years of age: The upper reference limit for °Creatinine is approximately 13% higher for people identified as °African-American. °  °  ° °Creatinine, Ser  °Date Value Ref Range Status  °11/16/2020 1.33 (H) 0.76 - 1.27 mg/dL Final  ° °Creatinine, Urine  °Date Value Ref Range Status  °09/05/2016 378 (H) 20 - 370 mg/dL Final  °  Comment:  °  Result repeated and verified. °Result confirmed by automatic dilution. °  °  °  °  °  Failed - eGFR in normal range and within 360 days  °  GFR, Est African American  °Date Value Ref Range Status  °09/05/2016 >89 >=60 mL/min Final  ° °GFR calc Af Amer  °Date Value Ref Range Status  °05/13/2020 83 >59 mL/min/1.73 Final  °  Comment:  °  **In accordance with recommendations from the NKF-ASN Task force,** °  [facility] is in the process of updating its eGFR calculation to the °  2021 CKD-EPI creatinine equation that estimates kidney function °  without a race variable. °  ° °GFR, Est Non African American  °Date Value Ref Range Status  °09/05/2016 88 >=60 mL/min Final  ° °GFR calc non Af Amer  °Date Value Ref Range Status  °05/13/2020 72 >59 mL/min/1.73 Final  ° °eGFR  °Date Value Ref Range Status  °11/16/2020 57 (L) >59 mL/min/1.73 Final  °  °  °  °  Failed - B12 Level in normal range  and within 720 days  °  No results found for: VITAMINB12  °  °  °  Failed - CBC within normal limits and completed in the last 12 months  °  WBC  °Date Value Ref Range Status  °07/18/2019 6.1 3.4 - 10.8 x10E3/uL Final  °05/05/2013 7.6 4.0 - 10.5 K/uL Final  ° °RBC  °Date Value Ref Range Status  °07/18/2019 4.73 4.14 - 5.80 x10E6/uL Final  °05/05/2013 4.82 4.22 - 5.81 MIL/uL Final  ° °Hemoglobin  °Date Value Ref Range Status  °07/18/2019 12.6 (L) 13.0 - 17.7 g/dL Final  ° °Hematocrit  °Date Value Ref Range Status  °07/18/2019 37.6 37.5 - 51.0 % Final  ° °MCHC  °Date Value Ref Range Status  °07/18/2019 33.5 31.5 - 35.7 g/dL Final  °05/05/2013 35.7 30.0 - 36.0 g/dL Final  ° °MCH  °Date Value Ref Range Status  °07/18/2019 26.6 26.6 - 33.0 pg Final  °05/05/2013 28.0 26.0 - 34.0 pg Final  ° °MCV  °Date Value Ref Range Status  °07/18/2019 80 79 - 97 fL Final  ° °No results found for: PLTCOUNTKUC, LABPLAT, POCPLA °RDW  °Date Value Ref Range Status  °07/18/2019 17.0 (  H) 11.6 - 15.4 % Final         Passed - HBA1C is between 0 and 7.9 and within 180 days    HbA1c, POC (controlled diabetic range)  Date Value Ref Range Status  02/14/2021 7.5 (A) 0.0 - 7.0 % Final          Passed - Valid encounter within last 6 months    Recent Outpatient Visits           5 months ago Type 2 diabetes mellitus with diabetic polyneuropathy, with long-term current use of insulin ([place])   [place], [place], MD   8 months ago Type 2 diabetes mellitus with diabetic polyneuropathy, with long-term current use of insulin ([place])   [place], [place], MD   10 months ago Type 2 diabetes mellitus with diabetic polyneuropathy, with long-term current use of insulin [facility])   [place], Boris L, RPH-CPP   11 months ago Type 2 diabetes mellitus with diabetic polyneuropathy, with long-term current use of insulin [facility])    [place], Keoki L, RPH-CPP   11 months ago Type 2 diabetes mellitus with diabetic polyneuropathy, with long-term current use of insulin [facility])   [place], RPH-CPP

## 2021-08-12 ENCOUNTER — Other Ambulatory Visit: Payer: Self-pay

## 2021-08-12 MED ORDER — METFORMIN HCL 1000 MG PO TABS
ORAL_TABLET | ORAL | 0 refills | Status: DC
  Filled 2021-08-12: qty 75, 30d supply, fill #0

## 2021-08-15 ENCOUNTER — Other Ambulatory Visit: Payer: Self-pay

## 2021-08-26 ENCOUNTER — Other Ambulatory Visit: Payer: Self-pay | Admitting: Family Medicine

## 2021-08-26 DIAGNOSIS — M19049 Primary osteoarthritis, unspecified hand: Secondary | ICD-10-CM

## 2021-08-30 ENCOUNTER — Other Ambulatory Visit: Payer: Self-pay | Admitting: Family Medicine

## 2021-08-30 DIAGNOSIS — M19049 Primary osteoarthritis, unspecified hand: Secondary | ICD-10-CM

## 2021-08-30 NOTE — Telephone Encounter (Signed)
Requested medications are due for refill today.  yes  Requested medications are on the active medications list.  yes  Last refill. 04/25/2021 #60 1 refill  Future visit scheduled.   no  Notes to clinic.  Called pt to make an appointment. Pt needs an afternoon appt and only wants to see Dr. Margarita Rana. No morning appts available. Wants to be seen.    Requested Prescriptions  Pending Prescriptions Disp Refills   diclofenac (VOLTAREN) 75 MG EC tablet 60 tablet 1    Sig: TAKE 1 TABLET (75 MG TOTAL) BY MOUTH 2 (TWO) TIMES DAILY.     Analgesics:  NSAIDS Failed - 08/30/2021  9:51 AM      Failed - Manual Review: Labs are only required if the patient has taken medication for more than 8 weeks.      Failed - Cr in normal range and within 360 days    Creat  Date Value Ref Range Status  09/05/2016 0.90 0.70 - 1.25 mg/dL Final    Comment:      For patients > or = 73 years of age: The upper reference limit for Creatinine is approximately 13% higher for people identified as African-American.      Creatinine, Ser  Date Value Ref Range Status  11/16/2020 1.33 (H) 0.76 - 1.27 mg/dL Final   Creatinine, Urine  Date Value Ref Range Status  09/05/2016 378 (H) 20 - 370 mg/dL Final    Comment:    Result repeated and verified. Result confirmed by automatic dilution.           Failed - HGB in normal range and within 360 days    Hemoglobin  Date Value Ref Range Status  07/18/2019 12.6 (L) 13.0 - 17.7 g/dL Final          Failed - PLT in normal range and within 360 days    Platelets  Date Value Ref Range Status  07/18/2019 368 150 - 450 x10E3/uL Final          Failed - HCT in normal range and within 360 days    Hematocrit  Date Value Ref Range Status  07/18/2019 37.6 37.5 - 51.0 % Final          Passed - eGFR is 30 or above and within 360 days    GFR, Est African American  Date Value Ref Range Status  09/05/2016 >89 >=60 mL/min Final   GFR calc Af Amer  Date Value Ref Range  Status  05/13/2020 83 >59 mL/min/1.73 Final    Comment:    **In accordance with recommendations from the NKF-ASN Task force,**   [facility] is in the process of updating its eGFR calculation to the   2021 CKD-EPI creatinine equation that estimates kidney function   without a race variable.    GFR, Est Non African American  Date Value Ref Range Status  09/05/2016 88 >=60 mL/min Final   GFR calc non Af Amer  Date Value Ref Range Status  05/13/2020 72 >59 mL/min/1.73 Final   eGFR  Date Value Ref Range Status  11/16/2020 57 (L) >59 mL/min/1.73 Final          Passed - Patient is not pregnant      Passed - Valid encounter within last 12 months    Recent Outpatient Visits           6 months ago Type 2 diabetes mellitus with diabetic polyneuropathy, with long-term current use of insulin ([place])   [facility], Charlane Ferretti, MD   9 months ago Type 2 diabetes mellitus with diabetic polyneuropathy, with long-term current use of insulin ([place])   [place], [place], MD   11 months ago Type 2 diabetes mellitus with diabetic polyneuropathy, with long-term current use of insulin [facility])   [place], Aime L, RPH-CPP   12 months ago Type 2 diabetes mellitus with diabetic polyneuropathy, with long-term current use of insulin [facility])   [place], Jarome [place]atin, RPH-CPP   1 year ago Type 2 diabetes mellitus with diabetic polyneuropathy, with long-term current use of insulin [facility])   [place], RPH-CPP

## 2021-08-31 ENCOUNTER — Other Ambulatory Visit: Payer: Self-pay

## 2021-08-31 MED ORDER — DICLOFENAC SODIUM 75 MG PO TBEC
DELAYED_RELEASE_TABLET | Freq: Two times a day (BID) | ORAL | 0 refills | Status: DC
  Filled 2021-08-31: qty 60, 30d supply, fill #0

## 2021-09-01 ENCOUNTER — Other Ambulatory Visit: Payer: Self-pay

## 2021-09-06 ENCOUNTER — Other Ambulatory Visit: Payer: Self-pay

## 2021-09-06 ENCOUNTER — Other Ambulatory Visit: Payer: Self-pay | Admitting: Family Medicine

## 2021-09-06 DIAGNOSIS — E1142 Type 2 diabetes mellitus with diabetic polyneuropathy: Secondary | ICD-10-CM

## 2021-09-07 ENCOUNTER — Other Ambulatory Visit: Payer: Self-pay | Admitting: Family Medicine

## 2021-09-07 ENCOUNTER — Other Ambulatory Visit: Payer: Self-pay

## 2021-09-07 DIAGNOSIS — E1142 Type 2 diabetes mellitus with diabetic polyneuropathy: Secondary | ICD-10-CM

## 2021-09-07 DIAGNOSIS — Z794 Long term (current) use of insulin: Secondary | ICD-10-CM

## 2021-09-07 NOTE — Telephone Encounter (Signed)
Requested medications are due for refill today.  yes ? ?Requested medications are on the active medications list.  yes ? ?Last refill. 06/04/2021 #90 0 refills ? ?Future visit scheduled.   no ? ?Notes to clinic.  Refill failed protocol d/t expired labs. ? ? ? ?Requested Prescriptions  ?Pending Prescriptions Disp Refills  ? losartan-hydrochlorothiazide (HYZAAR) 100-25 MG tablet 90 tablet 0  ?  Sig: TAKE 1 TABLET BY MOUTH DAILY. *DISCONTINUE LOSARTAN 50/12.5*  ?  ? Cardiovascular: ARB + Diuretic Combos Failed - 09/07/2021 11:36 AM  ?  ?  Failed - K in normal range and within 180 days  ?  Potassium  ?Date Value Ref Range Status  ?11/16/2020 4.9 3.5 - 5.2 mmol/L Final  ?  ?  ?  ?  Failed - Na in normal range and within 180 days  ?  Sodium  ?Date Value Ref Range Status  ?11/16/2020 143 134 - 144 mmol/L Final  ?  ?  ?  ?  Failed - Cr in normal range and within 180 days  ?  Creat  ?Date Value Ref Range Status  ?09/05/2016 0.90 0.70 - 1.25 mg/dL Final  ?  Comment:  ?    ?For patients > or = 73 years of age: The upper reference limit for ?Creatinine is approximately 13% higher for people identified as ?African-American. ?  ?  ? ?Creatinine, Ser  ?Date Value Ref Range Status  ?11/16/2020 1.33 (H) 0.76 - 1.27 mg/dL Final  ? ?Creatinine, Urine  ?Date Value Ref Range Status  ?09/05/2016 378 (H) 20 - 370 mg/dL Final  ?  Comment:  ?  Result repeated and verified. ?Result confirmed by automatic dilution. ?  ?  ?  ?  ?  Failed - eGFR is 10 or above and within 180 days  ?  GFR, Est African American  ?Date Value Ref Range Status  ?09/05/2016 >89 >=60 mL/min Final  ? ?GFR calc Af Wyvonnia Lora  ?Date Value Ref Range Status  ?05/13/2020 83 >59 mL/min/1.73 Final  ?  Comment:  ?  **In accordance with recommendations from the NKF-ASN Task force,** ?  [facility] is in the process of updating its eGFR calculation to the ?  2021 CKD-EPI creatinine equation that estimates kidney function ?  without a race variable. ?  ? ?GFR, Est Non African American  ?Date  Value Ref Range Status  ?09/05/2016 88 >=60 mL/min Final  ? ?GFR calc non Af Amer  ?Date Value Ref Range Status  ?05/13/2020 72 >59 mL/min/1.73 Final  ? ?eGFR  ?Date Value Ref Range Status  ?11/16/2020 57 (L) >59 mL/min/1.73 Final  ?  ?  ?  ?  Failed - Last BP in normal range  ?  BP Readings from Last 1 Encounters:  ?11/16/20 (!) 143/74  ?  ?  ?  ?  Failed - Valid encounter within last 6 months  ?  Recent Outpatient Visits   ? ?      ? 6 months ago Type 2 diabetes mellitus with diabetic polyneuropathy, with long-term current use of insulin ([place])  ? [place], Charlane Ferretti, MD  ? 9 months ago Type 2 diabetes mellitus with diabetic polyneuropathy, with long-term current use of insulin ([place])  ? [place], Charlane Ferretti, MD  ? 11 months ago Type 2 diabetes mellitus with diabetic polyneuropathy, with long-term current use of insulin ([place])  ? [place],  RPH-CPP  ? 1 year ago Type 2 diabetes mellitus with diabetic polyneuropathy, with long-term current use of insulin ([place])  ? [place], RPH-CPP  ? 1 year ago Type 2 diabetes mellitus with diabetic polyneuropathy, with long-term current use of insulin ([place])  ? [place], RPH-CPP  ? ?  ?  ? ?  ?  ?  Passed - Patient is not pregnant  ?  ?  ?  ?

## 2021-09-07 NOTE — Telephone Encounter (Signed)
Requested medication (s) are due for refill today:   Yes ? ?Requested medication (s) are on the active medication list:   Yes ? ?Future visit scheduled:   No   Message left to call in and make an appt. ? ? ?Last ordered: 08/12/2021 #75, 0 refills ? ?Returned because protocol criteria not met.   Lab work due.  ? ?Requested Prescriptions  ?Pending Prescriptions Disp Refills  ? metFORMIN (GLUCOPHAGE) 1000 MG tablet 75 tablet 0  ?  Sig: TAKE 1 AND 1/2 TABLETS ($RemoveBefo'1500MG'EyNxyMrvWlE$ ) BY MOUTH IN THE MORNING AND 1 TABLET ($RemoveB'1000MG'duMELnkI$ ) IN THE EVENING.  ?  ? Endocrinology:  Diabetes - Biguanides Failed - 09/07/2021 11:36 AM  ?  ?  Failed - Cr in normal range and within 360 days  ?  Creat  ?Date Value Ref Range Status  ?09/05/2016 0.90 0.70 - 1.25 mg/dL Final  ?  Comment:  ?    ?For patients > or = 73 years of age: The upper reference limit for ?Creatinine is approximately 13% higher for people identified as ?African-American. ?  ?  ? ?Creatinine, Ser  ?Date Value Ref Range Status  ?11/16/2020 1.33 (H) 0.76 - 1.27 mg/dL Final  ? ?Creatinine, Urine  ?Date Value Ref Range Status  ?09/05/2016 378 (H) 20 - 370 mg/dL Final  ?  Comment:  ?  Result repeated and verified. ?Result confirmed by automatic dilution. ?  ?  ?  ?  ?  Failed - HBA1C is between 0 and 7.9 and within 180 days  ?  HbA1c, POC (controlled diabetic range)  ?Date Value Ref Range Status  ?02/14/2021 7.5 (A) 0.0 - 7.0 % Final  ?  ?  ?  ?  Failed - eGFR in normal range and within 360 days  ?  GFR, Est African American  ?Date Value Ref Range Status  ?09/05/2016 >89 >=60 mL/min Final  ? ?GFR calc Af Wyvonnia Lora  ?Date Value Ref Range Status  ?05/13/2020 83 >59 mL/min/1.73 Final  ?  Comment:  ?  **In accordance with recommendations from the NKF-ASN Task force,** ?  [facility] is in the process of updating its eGFR calculation to the ?  2021 CKD-EPI creatinine equation that estimates kidney function ?  without a race variable. ?  ? ?GFR, Est Non African American  ?Date Value Ref Range Status  ?09/05/2016  88 >=60 mL/min Final  ? ?GFR calc non Af Amer  ?Date Value Ref Range Status  ?05/13/2020 72 >59 mL/min/1.73 Final  ? ?eGFR  ?Date Value Ref Range Status  ?11/16/2020 57 (L) >59 mL/min/1.73 Final  ?  ?  ?  ?  Failed - B12 Level in normal range and within 720 days  ?  No results found for: VITAMINB12  ?  ?  ?  Failed - Valid encounter within last 6 months  ?  Recent Outpatient Visits   ? ?      ? 6 months ago Type 2 diabetes mellitus with diabetic polyneuropathy, with long-term current use of insulin ([place])  ? [place], Charlane Ferretti, MD  ? 9 months ago Type 2 diabetes mellitus with diabetic polyneuropathy, with long-term current use of insulin ([place])  ? [place], Charlane Ferretti, MD  ? 11 months ago Type 2 diabetes mellitus with diabetic polyneuropathy, with long-term current use of insulin ([place])  ? [place], RPH-CPP  ? 1 year ago Type 2 diabetes  mellitus with diabetic polyneuropathy, with long-term current use of insulin ([place])  ? [place], RPH-CPP  ? 1 year ago Type 2 diabetes mellitus with diabetic polyneuropathy, with long-term current use of insulin ([place])  ? [place], RPH-CPP  ? ?  ?  ? ?  ?  ?  Failed - CBC within normal limits and completed in the last 12 months  ?  WBC  ?Date Value Ref Range Status  ?07/18/2019 6.1 3.4 - 10.8 x10E3/uL Final  ?05/05/2013 7.6 4.0 - 10.5 K/uL Final  ? ?RBC  ?Date Value Ref Range Status  ?07/18/2019 4.73 4.14 - 5.80 x10E6/uL Final  ?05/05/2013 4.82 4.22 - 5.81 MIL/uL Final  ? ?Hemoglobin  ?Date Value Ref Range Status  ?07/18/2019 12.6 (L) 13.0 - 17.7 g/dL Final  ? ?Hematocrit  ?Date Value Ref Range Status  ?07/18/2019 37.6 37.5 - 51.0 % Final  ? ?MCHC  ?Date Value Ref Range Status  ?07/18/2019 33.5 31.5 - 35.7 g/dL Final  ?05/05/2013 35.7 30.0 - 36.0  g/dL Final  ? ?MCH  ?Date Value Ref Range Status  ?07/18/2019 26.6 26.6 - 33.0 pg Final  ?05/05/2013 28.0 26.0 - 34.0 pg Final  ? ?MCV  ?Date Value Ref Range Status  ?07/18/2019 80 79 - 97 fL Final  ? ?No results found for: PLTCOUNTKUC, LABPLAT, [place] ?RDW  ?Date Value Ref Range Status  ?07/18/2019 17.0 (H) 11.6 - 15.4 % Final  ? ?  ?  ?  ? ?

## 2021-09-09 ENCOUNTER — Other Ambulatory Visit: Payer: Self-pay | Admitting: Family Medicine

## 2021-09-09 ENCOUNTER — Other Ambulatory Visit: Payer: Self-pay

## 2021-09-09 DIAGNOSIS — Z794 Long term (current) use of insulin: Secondary | ICD-10-CM

## 2021-09-09 DIAGNOSIS — I10 Essential (primary) hypertension: Secondary | ICD-10-CM

## 2021-09-09 DIAGNOSIS — E1142 Type 2 diabetes mellitus with diabetic polyneuropathy: Secondary | ICD-10-CM

## 2021-09-09 NOTE — Telephone Encounter (Signed)
Requested medication (s) are due for refill today - yes  Requested medication (s) are on the active medication list -yes  Future visit scheduled -yes  Last refill: Losartan 06/04/21 #90                 Amlodipine 08/07/21 #30                 Metformin 08/12/21 #75    Notes to clinic: Request RF: Patient has scheduled appointment- fails lab protocol, visit protocol, Rx have notes  Requested Prescriptions  Pending Prescriptions Disp Refills   losartan-hydrochlorothiazide (HYZAAR) 100-25 MG tablet 90 tablet 0    Sig: TAKE 1 TABLET BY MOUTH DAILY. *DISCONTINUE LOSARTAN 50/12.5*     Cardiovascular: ARB + Diuretic Combos Failed - 09/09/2021 10:54 AM      Failed - K in normal range and within 180 days    Potassium  Date Value Ref Range Status  11/16/2020 4.9 3.5 - 5.2 mmol/L Final          Failed - Na in normal range and within 180 days    Sodium  Date Value Ref Range Status  11/16/2020 143 134 - 144 mmol/L Final          Failed - Cr in normal range and within 180 days    Creat  Date Value Ref Range Status  09/05/2016 0.90 0.70 - 1.25 mg/dL Final    Comment:      For patients > or = 73 years of age: The upper reference limit for Creatinine is approximately 13% higher for people identified as African-American.      Creatinine, Ser  Date Value Ref Range Status  11/16/2020 1.33 (H) 0.76 - 1.27 mg/dL Final   Creatinine, Urine  Date Value Ref Range Status  09/05/2016 378 (H) 20 - 370 mg/dL Final    Comment:    Result repeated and verified. Result confirmed by automatic dilution.           Failed - eGFR is 10 or above and within 180 days    GFR, Est African American  Date Value Ref Range Status  09/05/2016 >89 >=60 mL/min Final   GFR calc Af Amer  Date Value Ref Range Status  05/13/2020 83 >59 mL/min/1.73 Final    Comment:    **In accordance with recommendations from the NKF-ASN Task force,**   [facility] is in the process of updating its eGFR calculation to the    2021 CKD-EPI creatinine equation that estimates kidney function   without a race variable.    GFR, Est Non African American  Date Value Ref Range Status  09/05/2016 88 >=60 mL/min Final   GFR calc non Af Amer  Date Value Ref Range Status  05/13/2020 72 >59 mL/min/1.73 Final   eGFR  Date Value Ref Range Status  11/16/2020 57 (L) >59 mL/min/1.73 Final          Failed - Last BP in normal range    BP Readings from Last 1 Encounters:  11/16/20 (!) 143/74          Failed - Valid encounter within last 6 months    Recent Outpatient Visits           6 months ago Type 2 diabetes mellitus with diabetic polyneuropathy, with long-term current use of insulin (HCC)    [facility] [place], Odette Horns, MD   9 months ago Type 2 diabetes mellitus with diabetic polyneuropathy, with long-term current use of insulin (  HCC)   [place] [facility] [place], [place], MD   11 months ago Type 2 diabetes mellitus with diabetic polyneuropathy, with long-term current use of insulin [facility])   [place] [facility] [place], Harrol L, RPH-CPP   1 year ago Type 2 diabetes mellitus with diabetic polyneuropathy, with long-term current use of insulin [facility])   [place] [facility] [place], Cornelius Moras, RPH-CPP   1 year ago Type 2 diabetes mellitus with diabetic polyneuropathy, with long-term current use of insulin [facility])   [place] [facility] [place], Cornelius Moras, RPH-CPP       Future Appointments             In 3 months Hoy Register, MD [facility]            Passed - Patient is not pregnant       amLODipine (NORVASC) 10 MG tablet 30 tablet 0    Sig: Take 1 tablet (10 mg total) by mouth daily.     Cardiovascular: Calcium Channel Blockers 2 Failed - 09/09/2021 10:54 AM      Failed - Last BP in normal range    BP Readings from Last 1 Encounters:  11/16/20  (!) 143/74          Failed - Valid encounter within last 6 months    Recent Outpatient Visits           6 months ago Type 2 diabetes mellitus with diabetic polyneuropathy, with long-term current use of insulin (HCC)   [place] [facility] [place], [place], MD   9 months ago Type 2 diabetes mellitus with diabetic polyneuropathy, with long-term current use of insulin (HCC)   [place] [facility] [place], [place], MD   11 months ago Type 2 diabetes mellitus with diabetic polyneuropathy, with long-term current use of insulin (HCC)   [place] [facility] [place], Jeannett Senior L, RPH-CPP   1 year ago Type 2 diabetes mellitus with diabetic polyneuropathy, with long-term current use of insulin [facility])   [place] [facility] [place], Jeannett Senior L, RPH-CPP   1 year ago Type 2 diabetes mellitus with diabetic polyneuropathy, with long-term current use of insulin [facility])    [facility] [place], Cornelius Moras, RPH-CPP       Future Appointments             In 3 months Hoy Register, MD [facility]            Passed - Last Heart Rate in normal range    Pulse Readings from Last 1 Encounters:  11/16/20 88           metFORMIN (GLUCOPHAGE) 1000 MG tablet 75 tablet 0    Sig: TAKE 1 AND 1/2 TABLETS (1500MG ) BY MOUTH IN THE MORNING AND 1 TABLET (1000MG ) IN THE EVENING.     Endocrinology:  Diabetes - Biguanides Failed - 09/09/2021 10:54 AM      Failed - Cr in normal range and within 360 days    Creat  Date Value Ref Range Status  09/05/2016 0.90 0.70 - 1.25 mg/dL Final    Comment:      For patients > or = 73 years of age: The upper reference limit for Creatinine is approximately 13% higher for people identified as African-American.      Creatinine,  Ser  Date Value Ref Range Status  11/16/2020 1.33 (H) 0.76 - 1.27 mg/dL Final   Creatinine, Urine   Date Value Ref Range Status  09/05/2016 378 (H) 20 - 370 mg/dL Final    Comment:    Result repeated and verified. Result confirmed by automatic dilution.           Failed - HBA1C is between 0 and 7.9 and within 180 days    HbA1c, POC (controlled diabetic range)  Date Value Ref Range Status  02/14/2021 7.5 (A) 0.0 - 7.0 % Final          Failed - eGFR in normal range and within 360 days    GFR, Est African American  Date Value Ref Range Status  09/05/2016 >89 >=60 mL/min Final   GFR calc Af Amer  Date Value Ref Range Status  05/13/2020 83 >59 mL/min/1.73 Final    Comment:    **In accordance with recommendations from the NKF-ASN Task force,**   [facility] is in the process of updating its eGFR calculation to the   2021 CKD-EPI creatinine equation that estimates kidney function   without a race variable.    GFR, Est Non African American  Date Value Ref Range Status  09/05/2016 88 >=60 mL/min Final   GFR calc non Af Amer  Date Value Ref Range Status  05/13/2020 72 >59 mL/min/1.73 Final   eGFR  Date Value Ref Range Status  11/16/2020 57 (L) >59 mL/min/1.73 Final          Failed - B12 Level in normal range and within 720 days    No results found for: VITAMINB12        Failed - Valid encounter within last 6 months    Recent Outpatient Visits           6 months ago Type 2 diabetes mellitus with diabetic polyneuropathy, with long-term current use of insulin (HCC)   [facility] [place], [place], MD   9 months ago Type 2 diabetes mellitus with diabetic polyneuropathy, with long-term current use of insulin (HCC)   [place] [facility] [place], [place], MD   11 months ago Type 2 diabetes mellitus with diabetic polyneuropathy, with long-term current use of insulin (HCC)   [place] [facility] [place], Jeannett Senior L, RPH-CPP   1 year ago Type 2 diabetes mellitus with diabetic polyneuropathy, with long-term  current use of insulin [facility])   [place] [facility] [place], Jeannett Senior L, RPH-CPP   1 year ago Type 2 diabetes mellitus with diabetic polyneuropathy, with long-term current use of insulin [facility])   [place] [facility] And Wellness [place], Cornelius Moras, RPH-CPP       Future Appointments             In 3 months Hoy Register, MD [facility]ness            Failed - CBC within normal limits and completed in the last 12 months    WBC  Date Value Ref Range Status  07/18/2019 6.1 3.4 - 10.8 x10E3/uL Final  05/05/2013 7.6 4.0 - 10.5 K/uL Final   RBC  Date Value Ref Range Status  07/18/2019 4.73 4.14 - 5.80 x10E6/uL Final  05/05/2013 4.82 4.22 - 5.81 MIL/uL Final   Hemoglobin  Date Value Ref Range Status  07/18/2019 12.6 (L) 13.0 - 17.7 g/dL Final   Hematocrit  Date Value Ref  Range Status  07/18/2019 37.6 37.5 - 51.0 % Final   MCHC  Date Value Ref Range Status  07/18/2019 33.5 31.5 - 35.7 g/dL Final  13/02/6577 46.9 30.0 - 36.0 g/dL Final   [facility]  Date Value Ref Range Status  07/18/2019 26.6 26.6 - 33.0 pg Final  05/05/2013 28.0 26.0 - 34.0 pg Final   MCV  Date Value Ref Range Status  07/18/2019 80 79 - 97 fL Final   No results found for: PLTCOUNTKUC, LABPLAT, POCPLA RDW  Date Value Ref Range Status  07/18/2019 17.0 (H) 11.6 - 15.4 % Final            Requested Prescriptions  Pending Prescriptions Disp Refills   losartan-hydrochlorothiazide (HYZAAR) 100-25 MG tablet 90 tablet 0    Sig: TAKE 1 TABLET BY MOUTH DAILY. *DISCONTINUE LOSARTAN 50/12.5*     Cardiovascular: ARB + Diuretic Combos Failed - 09/09/2021 10:54 AM      Failed - K in normal range and within 180 days    Potassium  Date Value Ref Range Status  11/16/2020 4.9 3.5 - 5.2 mmol/L Final          Failed - Na in normal range and within 180 days    Sodium  Date Value Ref Range Status  11/16/2020 143 134 - 144 mmol/L Final           Failed - Cr in normal range and within 180 days    Creat  Date Value Ref Range Status  09/05/2016 0.90 0.70 - 1.25 mg/dL Final    Comment:      For patients > or = 73 years of age: The upper reference limit for Creatinine is approximately 13% higher for people identified as African-American.      Creatinine, Ser  Date Value Ref Range Status  11/16/2020 1.33 (H) 0.76 - 1.27 mg/dL Final   Creatinine, Urine  Date Value Ref Range Status  09/05/2016 378 (H) 20 - 370 mg/dL Final    Comment:    Result repeated and verified. Result confirmed by automatic dilution.           Failed - eGFR is 10 or above and within 180 days    GFR, Est African American  Date Value Ref Range Status  09/05/2016 >89 >=60 mL/min Final   GFR calc Af Amer  Date Value Ref Range Status  05/13/2020 83 >59 mL/min/1.73 Final    Comment:    **In accordance with recommendations from the NKF-ASN Task force,**   [facility] is in the process of updating its eGFR calculation to the   2021 CKD-EPI creatinine equation that estimates kidney function   without a race variable.    GFR, Est Non African American  Date Value Ref Range Status  09/05/2016 88 >=60 mL/min Final   GFR calc non Af Amer  Date Value Ref Range Status  05/13/2020 72 >59 mL/min/1.73 Final   eGFR  Date Value Ref Range Status  11/16/2020 57 (L) >59 mL/min/1.73 Final          Failed - Last BP in normal range    BP Readings from Last 1 Encounters:  11/16/20 (!) 143/74          Failed - Valid encounter within last 6 months    Recent Outpatient Visits           6 months ago Type 2 diabetes mellitus with diabetic polyneuropathy, with long-term current use of insulin (HCC)   [place] Hoy Register, MD   9 months ago Type 2 diabetes mellitus with diabetic polyneuropathy, with long-term current use of insulin (HCC)   [place] [facility] [place], [place], MD   11 months ago Type 2  diabetes mellitus with diabetic polyneuropathy, with long-term current use of insulin [facility])   [place] [facility] [place], Hasson L, RPH-CPP   1 year ago Type 2 diabetes mellitus with diabetic polyneuropathy, with long-term current use of insulin [facility])   [place] [facility] [place], Cornelius Moras, RPH-CPP   1 year ago Type 2 diabetes mellitus with diabetic polyneuropathy, with long-term current use of insulin [facility])   [place] [facility] [place], Cornelius Moras, RPH-CPP       Future Appointments             In 3 months Hoy Register, MD [facility]            Passed - Patient is not pregnant       amLODipine (NORVASC) 10 MG tablet 30 tablet 0    Sig: Take 1 tablet (10 mg total) by mouth daily.     Cardiovascular: Calcium Channel Blockers 2 Failed - 09/09/2021 10:54 AM      Failed - Last BP in normal range    BP Readings from Last 1 Encounters:  11/16/20 (!) 143/74          Failed - Valid encounter within last 6 months    Recent Outpatient Visits           6 months ago Type 2 diabetes mellitus with diabetic polyneuropathy, with long-term current use of insulin (HCC)   [place] [facility] [place], [place], MD   9 months ago Type 2 diabetes mellitus with diabetic polyneuropathy, with long-term current use of insulin (HCC)   [place] [facility] [place], [place], MD   11 months ago Type 2 diabetes mellitus with diabetic polyneuropathy, with long-term current use of insulin (HCC)   [place] [facility] [place], Jeannett Senior L, RPH-CPP   1 year ago Type 2 diabetes mellitus with diabetic polyneuropathy, with long-term current use of insulin [facility])   [place] [facility] [place], Jeannett Senior L, RPH-CPP   1 year ago Type 2 diabetes mellitus with diabetic polyneuropathy, with long-term current use of  insulin [facility])    [facility] [place], Cornelius Moras, RPH-CPP       Future Appointments             In 3 months Hoy Register, MD [facility]            Passed - Last Heart Rate in normal range    Pulse Readings from Last 1 Encounters:  11/16/20 88           metFORMIN (GLUCOPHAGE) 1000 MG tablet 75 tablet 0    Sig: TAKE 1 AND 1/2 TABLETS (1500MG ) BY MOUTH IN THE MORNING AND 1 TABLET (1000MG ) IN THE EVENING.     Endocrinology:  Diabetes - Biguanides Failed - 09/09/2021 10:54 AM      Failed - Cr in normal range and within 360 days    Creat  Date Value Ref Range Status  09/05/2016 0.90 0.70 - 1.25 mg/dL Final    Comment:      For patients > or = 73 years  of age: The upper reference limit for Creatinine is approximately 13% higher for people identified as African-American.      Creatinine, Ser  Date Value Ref Range Status  11/16/2020 1.33 (H) 0.76 - 1.27 mg/dL Final   Creatinine, Urine  Date Value Ref Range Status  09/05/2016 378 (H) 20 - 370 mg/dL Final    Comment:    Result repeated and verified. Result confirmed by automatic dilution.           Failed - HBA1C is between 0 and 7.9 and within 180 days    HbA1c, POC (controlled diabetic range)  Date Value Ref Range Status  02/14/2021 7.5 (A) 0.0 - 7.0 % Final          Failed - eGFR in normal range and within 360 days    GFR, Est African American  Date Value Ref Range Status  09/05/2016 >89 >=60 mL/min Final   GFR calc Af Amer  Date Value Ref Range Status  05/13/2020 83 >59 mL/min/1.73 Final    Comment:    **In accordance with recommendations from the NKF-ASN Task force,**   [facility] is in the process of updating its eGFR calculation to the   2021 CKD-EPI creatinine equation that estimates kidney function   without a race variable.    GFR, Est Non African American  Date Value Ref Range Status  09/05/2016 88 >=60 mL/min Final   GFR calc  non Af Amer  Date Value Ref Range Status  05/13/2020 72 >59 mL/min/1.73 Final   eGFR  Date Value Ref Range Status  11/16/2020 57 (L) >59 mL/min/1.73 Final          Failed - B12 Level in normal range and within 720 days    No results found for: VITAMINB12        Failed - Valid encounter within last 6 months    Recent Outpatient Visits           6 months ago Type 2 diabetes mellitus with diabetic polyneuropathy, with long-term current use of insulin (HCC)   [place] [facility] [place], [place], MD   9 months ago Type 2 diabetes mellitus with diabetic polyneuropathy, with long-term current use of insulin (HCC)   [place] [facility] [place], [place], MD   11 months ago Type 2 diabetes mellitus with diabetic polyneuropathy, with long-term current use of insulin (HCC)   [place] [facility] [place], Jeannett Senior L, RPH-CPP   1 year ago Type 2 diabetes mellitus with diabetic polyneuropathy, with long-term current use of insulin [facility])   [place] [facility] [place], Jeannett Senior L, RPH-CPP   1 year ago Type 2 diabetes mellitus with diabetic polyneuropathy, with long-term current use of insulin [facility])   [place] [facility] [place], Cornelius Moras, RPH-CPP       Future Appointments             In 3 months Hoy Register, MD [facility]            Failed - CBC within normal limits and completed in the last 12 months    WBC  Date Value Ref Range Status  07/18/2019 6.1 3.4 - 10.8 x10E3/uL Final  05/05/2013 7.6 4.0 - 10.5 K/uL Final   RBC  Date Value Ref Range Status  07/18/2019 4.73 4.14 - 5.80 x10E6/uL Final  05/05/2013 4.82 4.22 - 5.81 MIL/uL Final   Hemoglobin  Date Value Ref Range Status  07/18/2019 12.6 (L) 13.0 - 17.7 g/dL Final   Hematocrit  Date Value Ref Range Status  07/18/2019 37.6 37.5 - 51.0 % Final   MCHC  Date Value Ref  Range Status  07/18/2019 33.5 31.5 - 35.7 g/dL Final  16/04/9603 54.0 30.0 - 36.0 g/dL Final   [facility]  Date Value Ref Range Status  07/18/2019 26.6 26.6 - 33.0 pg Final  05/05/2013 28.0 26.0 - 34.0 pg Final   MCV  Date Value Ref Range Status  07/18/2019 80 79 - 97 fL Final   No results found for: PLTCOUNTKUC, LABPLAT, POCPLA RDW  Date Value Ref Range Status  07/18/2019 17.0 (H) 11.6 - 15.4 % Final

## 2021-09-13 ENCOUNTER — Other Ambulatory Visit: Payer: Self-pay

## 2021-09-13 ENCOUNTER — Telehealth: Payer: Self-pay | Admitting: Family Medicine

## 2021-09-13 ENCOUNTER — Other Ambulatory Visit: Payer: Self-pay | Admitting: Family Medicine

## 2021-09-13 NOTE — Telephone Encounter (Signed)
Pt came in stating he is out of the following medications- Amlodipine, Losartan/HCTZ, Metformin and test strips ? ?Pt has an appt on 6/7, requesting a refill to last until his appt  ? ?Pharm: [facility] ?

## 2021-09-14 NOTE — Telephone Encounter (Signed)
Requested Prescriptions  ?Pending Prescriptions Disp Refills  ?? glucose blood (ACCU-CHEK AVIVA PLUS) test strip [Pharmacy Med Name: Accu-Chek Aviva Plus In Vitro Strip] 100 each 0  ?  Sig: USE 1 STRIP TO CHECK GLUCOSE THREE TIMES DAILY FOR BLOOD SUGAR --MUST  HAVE  OFFICE  VISIT  FOR  REFILLS  ?  ? Endocrinology: Diabetes - Testing Supplies Passed - 09/13/2021 12:08 PM  ?  ?  Passed - Valid encounter within last 12 months  ?  Recent Outpatient Visits   ?      ? 7 months ago Type 2 diabetes mellitus with diabetic polyneuropathy, with long-term current use of insulin ([place])  ? [place], Charlane Ferretti, MD  ? 10 months ago Type 2 diabetes mellitus with diabetic polyneuropathy, with long-term current use of insulin ([place])  ? [place], Charlane Ferretti, MD  ? 11 months ago Type 2 diabetes mellitus with diabetic polyneuropathy, with long-term current use of insulin ([place])  ? [place], RPH-CPP  ? 1 year ago Type 2 diabetes mellitus with diabetic polyneuropathy, with long-term current use of insulin ([place])  ? [place], RPH-CPP  ? 1 year ago Type 2 diabetes mellitus with diabetic polyneuropathy, with long-term current use of insulin ([place])  ? [place], RPH-CPP  ?  ?  ?Future Appointments   ?        ? In 3 months Charlott Rakes, MD [place]  ?  ? ?  ?  ?  ? ? ?

## 2021-09-14 NOTE — Telephone Encounter (Signed)
Pt was called and given an earlier appointment to get his medications refilled. ?

## 2021-09-15 ENCOUNTER — Other Ambulatory Visit: Payer: Self-pay

## 2021-09-19 ENCOUNTER — Other Ambulatory Visit: Payer: Self-pay

## 2021-09-19 ENCOUNTER — Other Ambulatory Visit: Payer: Self-pay | Admitting: Family Medicine

## 2021-09-19 ENCOUNTER — Other Ambulatory Visit: Payer: Self-pay | Admitting: *Deleted

## 2021-09-19 DIAGNOSIS — E1142 Type 2 diabetes mellitus with diabetic polyneuropathy: Secondary | ICD-10-CM

## 2021-09-19 DIAGNOSIS — K219 Gastro-esophageal reflux disease without esophagitis: Secondary | ICD-10-CM

## 2021-09-19 DIAGNOSIS — I10 Essential (primary) hypertension: Secondary | ICD-10-CM

## 2021-09-19 DIAGNOSIS — M19049 Primary osteoarthritis, unspecified hand: Secondary | ICD-10-CM

## 2021-09-19 DIAGNOSIS — E782 Mixed hyperlipidemia: Secondary | ICD-10-CM

## 2021-09-19 NOTE — Patient Outreach (Addendum)
[facility]) Care Management ? ?09/19/2021 ? ?Jerry Serrano ?Nov 23, 1950 ?[id number] ? ?RN Health Coach telephone call to patient.  [employer] compliance verified. ?Patient has not checked his blood sugars today. Per patient he has not received his medications. Patient stated that he was going to go to the pharmacy today to pick them up. ? ?Johny Shock BSN RN ?[facility] ?[phone] ? ? ?Research scientist (medical) received a call from patient while at pharmacy. Per patient they did not have medications refilled. Patient stated that the pharmacy stated they were denied until appointment. RN spoke with pharmacist and stated that they were denied until patient comes in for appointment. RN discussed with pharmacist that the patient is on insulin, metformin, Amlodipine, Losartan and Lipitor. These are not medications that can wait until the 20 th. The patient has been out and trying to get refilled since last week. Not taking his insulin. Pharmacy stated they would try to send a fax over to Dr. ? RN sent in basket high priority message to PCP to make her aware. ? ?Johny Shock BSN RN ?[facility] ?[phone] ? ? ? ?

## 2021-09-20 ENCOUNTER — Other Ambulatory Visit: Payer: Self-pay

## 2021-09-20 MED ORDER — ATORVASTATIN CALCIUM 40 MG PO TABS
ORAL_TABLET | Freq: Every day | ORAL | 0 refills | Status: DC
  Filled 2021-09-20: qty 30, fill #0

## 2021-09-20 MED ORDER — PANTOPRAZOLE SODIUM 40 MG PO TBEC
DELAYED_RELEASE_TABLET | Freq: Every day | ORAL | 0 refills | Status: DC
  Filled 2021-09-20: qty 30, 30d supply, fill #0

## 2021-09-20 MED ORDER — METFORMIN HCL 1000 MG PO TABS
ORAL_TABLET | ORAL | 0 refills | Status: DC
  Filled 2021-09-20: qty 75, 30d supply, fill #0

## 2021-09-20 MED ORDER — DICLOFENAC SODIUM 75 MG PO TBEC
DELAYED_RELEASE_TABLET | Freq: Two times a day (BID) | ORAL | 0 refills | Status: DC
  Filled 2021-09-20: qty 60, fill #0

## 2021-09-20 MED ORDER — LOSARTAN POTASSIUM-HCTZ 100-25 MG PO TABS
ORAL_TABLET | ORAL | 0 refills | Status: DC
Start: 2021-09-20 — End: 2022-01-17
  Filled 2021-09-20: qty 30, 30d supply, fill #0

## 2021-09-20 MED ORDER — AMLODIPINE BESYLATE 10 MG PO TABS
10.0000 mg | ORAL_TABLET | Freq: Every day | ORAL | 0 refills | Status: DC
  Filled 2021-09-20: qty 30, 30d supply, fill #0

## 2021-09-20 NOTE — Telephone Encounter (Signed)
Requested Prescriptions  ?Pending Prescriptions Disp Refills  ?? pantoprazole (PROTONIX) 40 MG tablet 30 tablet 0  ?  Sig: TAKE 1 TABLET BY MOUTH DAILY.  ?  ? Gastroenterology: Proton Pump Inhibitors Passed - 09/19/2021  2:18 PM  ?  ?  Passed - Valid encounter within last 12 months  ?  Recent Outpatient Visits   ?      ? 7 months ago Type 2 diabetes mellitus with diabetic polyneuropathy, with long-term current use of insulin ([place])  ? [place], Charlane Ferretti, MD  ? 10 months ago Type 2 diabetes mellitus with diabetic polyneuropathy, with long-term current use of insulin ([place])  ? [place], Charlane Ferretti, MD  ? 11 months ago Type 2 diabetes mellitus with diabetic polyneuropathy, with long-term current use of insulin ([place])  ? [place], RPH-CPP  ? 1 year ago Type 2 diabetes mellitus with diabetic polyneuropathy, with long-term current use of insulin ([place])  ? [place], RPH-CPP  ? 1 year ago Type 2 diabetes mellitus with diabetic polyneuropathy, with long-term current use of insulin ([place])  ? [place], RPH-CPP  ?  ?  ?Future Appointments   ?        ? In 6 days Charlott Rakes, MD [place]  ?  ? ?  ?  ?  ?? atorvastatin (LIPITOR) 40 MG tablet 30 tablet 0  ?  Sig: TAKE 1 TABLET (40 MG TOTAL) BY MOUTH DAILY.  ?  ? Cardiovascular:  Antilipid - Statins Failed - 09/19/2021  2:18 PM  ?  ?  Failed - Lipid Panel in normal range within the last 12 months  ?  Cholesterol, Total  ?Date Value Ref Range Status  ?11/16/2020 131 100 - 199 mg/dL Final  ? ?LDL Chol Calc (NIH)  ?Date Value Ref Range Status  ?11/16/2020 73 0 - 99 mg/dL Final  ? ?HDL  ?Date Value Ref Range Status  ?11/16/2020 44 >39 mg/dL Final  ? ?Triglycerides  ?Date Value Ref Range Status  ?11/16/2020 65 0 - 149  mg/dL Final  ? ?  ?  ?  Passed - Patient is not pregnant  ?  ?  Passed - Valid encounter within last 12 months  ?  Recent Outpatient Visits   ?      ? 7 months ago Type 2 diabetes mellitus with diabetic polyneuropathy, with long-term current use of insulin ([place])  ? [place], Charlane Ferretti, MD  ? 10 months ago Type 2 diabetes mellitus with diabetic polyneuropathy, with long-term current use of insulin ([place])  ? [employer], Charlane Ferretti, MD  ? 11 months ago Type 2 diabetes mellitus with diabetic polyneuropathy, with long-term current use of insulin ([place])  ? [place], RPH-CPP  ? 1 year ago Type 2 diabetes mellitus with diabetic polyneuropathy, with long-term current use of insulin ([place])  ? [place], RPH-CPP  ? 1 year ago Type 2 diabetes mellitus with diabetic polyneuropathy, with long-term current use of insulin ([place])  ? [place], RPH-CPP  ?  ?  ?Future Appointments   ?        ?  In 6 days Charlott Rakes, MD [place]  ?  ? ?  ?  ?  ?? losartan-hydrochlorothiazide (HYZAAR) 100-25 MG tablet 30 tablet 0  ?  Sig: TAKE 1 TABLET BY MOUTH DAILY. *DISCONTINUE LOSARTAN 50/12.5*  ?  ? Cardiovascular: ARB + Diuretic Combos Failed - 09/19/2021  2:18 PM  ?  ?  Failed - K in normal range and within 180 days  ?  Potassium  ?Date Value Ref Range Status  ?11/16/2020 4.9 3.5 - 5.2 mmol/L Final  ?   ?  ?  Failed - Na in normal range and within 180 days  ?  Sodium  ?Date Value Ref Range Status  ?11/16/2020 143 134 - 144 mmol/L Final  ?   ?  ?  Failed - Cr in normal range and within 180 days  ?  Creat  ?Date Value Ref Range Status  ?09/05/2016 0.90 0.70 - 1.25 mg/dL Final  ?  Comment:  ?    ?For patients > or = 73 years of age: The upper reference limit for ?Creatinine is  approximately 13% higher for people identified as ?African-American. ?  ?  ? ?Creatinine, Ser  ?Date Value Ref Range Status  ?11/16/2020 1.33 (H) 0.76 - 1.27 mg/dL Final  ? ?Creatinine, Urine  ?Date Value Ref Range Status  ?09/05/2016 378 (H) 20 - 370 mg/dL Final  ?  Comment:  ?  Result repeated and verified. ?Result confirmed by automatic dilution. ?  ?   ?  ?  Failed - eGFR is 10 or above and within 180 days  ?  GFR, Est African American  ?Date Value Ref Range Status  ?09/05/2016 >89 >=60 mL/min Final  ? ?GFR calc Af Wyvonnia Lora  ?Date Value Ref Range Status  ?05/13/2020 83 >59 mL/min/1.73 Final  ?  Comment:  ?  **In accordance with recommendations from the NKF-ASN Task force,** ?  [facility] is in the process of updating its eGFR calculation to the ?  2021 CKD-EPI creatinine equation that estimates kidney function ?  without a race variable. ?  ? ?GFR, Est Non African American  ?Date Value Ref Range Status  ?09/05/2016 88 >=60 mL/min Final  ? ?GFR calc non Af Amer  ?Date Value Ref Range Status  ?05/13/2020 72 >59 mL/min/1.73 Final  ? ?eGFR  ?Date Value Ref Range Status  ?11/16/2020 57 (L) >59 mL/min/1.73 Final  ?   ?  ?  Failed - Last BP in normal range  ?  BP Readings from Last 1 Encounters:  ?11/16/20 (!) 143/74  ?   ?  ?  Failed - Valid encounter within last 6 months  ?  Recent Outpatient Visits   ?      ? 7 months ago Type 2 diabetes mellitus with diabetic polyneuropathy, with long-term current use of insulin ([place])  ? [place], Charlane Ferretti, MD  ? 10 months ago Type 2 diabetes mellitus with diabetic polyneuropathy, with long-term current use of insulin ([place])  ? [place], Charlane Ferretti, MD  ? 11 months ago Type 2 diabetes mellitus with diabetic polyneuropathy, with long-term current use of insulin ([place])  ? [place], RPH-CPP  ? 1 year ago Type 2 diabetes mellitus with diabetic polyneuropathy, with  long-term current use of insulin ([place])  ? [place], RPH-CPP  ? 1 year ago  Type 2 diabetes mellitus with diabetic polyneuropathy, with long-term current use of insulin ([place])  ? [place], RPH-CPP  ?  ?  ?Future Appointments   ?        ? In 6 days Charlott Rakes, MD [place]  ?  ? ?  ?  ?  Passed - Patient is not pregnant  ?  ?  ?? amLODipine (NORVASC) 10 MG tablet 30 tablet 0  ?  Sig: Take 1 tablet (10 mg total) by mouth daily.  ?  ? Cardiovascular: Calcium Channel Blockers 2 Failed - 09/19/2021  2:18 PM  ?  ?  Failed - Last BP in normal range  ?  BP Readings from Last 1 Encounters:  ?11/16/20 (!) 143/74  ?   ?  ?  Failed - Valid encounter within last 6 months  ?  Recent Outpatient Visits   ?      ? 7 months ago Type 2 diabetes mellitus with diabetic polyneuropathy, with long-term current use of insulin ([place])  ? [place], Charlane Ferretti, MD  ? 10 months ago Type 2 diabetes mellitus with diabetic polyneuropathy, with long-term current use of insulin ([place])  ? [place], Charlane Ferretti, MD  ? 11 months ago Type 2 diabetes mellitus with diabetic polyneuropathy, with long-term current use of insulin ([place])  ? [place], RPH-CPP  ? 1 year ago Type 2 diabetes mellitus with diabetic polyneuropathy, with long-term current use of insulin ([place])  ? [place], RPH-CPP  ? 1 year ago Type 2 diabetes mellitus with diabetic polyneuropathy, with long-term current use of insulin ([place])  ? [place], RPH-CPP  ?  ?  ?Future Appointments   ?        ? In 6 days Charlott Rakes, MD [place]  ?  ? ?  ?  ?  Passed - Last Heart Rate in normal  range  ?  Pulse Readings from Last 1 Encounters:  ?11/16/20 88  ?   ?  ?  ?? metFORMIN (GLUCOPHAGE) 1000 MG tablet 75 tablet 0  ?  Sig: TAKE 1 AND 1/2 TABLETS (1500MG) BY MOUTH IN THE MORNING AND 1 TABLET (100

## 2021-09-26 ENCOUNTER — Other Ambulatory Visit: Payer: Self-pay

## 2021-09-26 ENCOUNTER — Ambulatory Visit: Payer: Medicare Other | Attending: Family Medicine | Admitting: Family Medicine

## 2021-09-26 ENCOUNTER — Encounter: Payer: Self-pay | Admitting: Family Medicine

## 2021-09-26 VITALS — BP 142/74 | HR 84 | Ht 64.0 in | Wt 147.0 lb

## 2021-09-26 DIAGNOSIS — E1169 Type 2 diabetes mellitus with other specified complication: Secondary | ICD-10-CM | POA: Diagnosis not present

## 2021-09-26 DIAGNOSIS — I152 Hypertension secondary to endocrine disorders: Secondary | ICD-10-CM | POA: Diagnosis not present

## 2021-09-26 DIAGNOSIS — K219 Gastro-esophageal reflux disease without esophagitis: Secondary | ICD-10-CM | POA: Diagnosis not present

## 2021-09-26 DIAGNOSIS — E1142 Type 2 diabetes mellitus with diabetic polyneuropathy: Secondary | ICD-10-CM

## 2021-09-26 DIAGNOSIS — E11649 Type 2 diabetes mellitus with hypoglycemia without coma: Secondary | ICD-10-CM | POA: Diagnosis not present

## 2021-09-26 DIAGNOSIS — M19049 Primary osteoarthritis, unspecified hand: Secondary | ICD-10-CM | POA: Diagnosis not present

## 2021-09-26 DIAGNOSIS — E785 Hyperlipidemia, unspecified: Secondary | ICD-10-CM | POA: Diagnosis not present

## 2021-09-26 DIAGNOSIS — Z794 Long term (current) use of insulin: Secondary | ICD-10-CM | POA: Diagnosis not present

## 2021-09-26 DIAGNOSIS — E1159 Type 2 diabetes mellitus with other circulatory complications: Secondary | ICD-10-CM

## 2021-09-26 LAB — POCT GLYCOSYLATED HEMOGLOBIN (HGB A1C): HbA1c, POC (controlled diabetic range): 7.2 % — AB (ref 0.0–7.0)

## 2021-09-26 LAB — GLUCOSE, POCT (MANUAL RESULT ENTRY): POC Glucose: 166 mg/dl — AB (ref 70–99)

## 2021-09-26 MED ORDER — INSULIN GLARGINE 100 UNIT/ML SOLOSTAR PEN
25.0000 [IU] | PEN_INJECTOR | Freq: Every day | SUBCUTANEOUS | 6 refills | Status: DC
  Filled 2021-09-26: qty 6, 24d supply, fill #0
  Filled 2021-11-24: qty 6, 24d supply, fill #1
  Filled 2021-12-27: qty 6, 24d supply, fill #2

## 2021-09-26 MED ORDER — AMLODIPINE BESYLATE 10 MG PO TABS
10.0000 mg | ORAL_TABLET | Freq: Every day | ORAL | 1 refills | Status: DC
  Filled 2021-09-26 – 2021-11-04 (×2): qty 90, 90d supply, fill #0
  Filled 2022-01-18: qty 90, 90d supply, fill #1

## 2021-09-26 MED ORDER — GABAPENTIN 300 MG PO CAPS
300.0000 mg | ORAL_CAPSULE | Freq: Every day | ORAL | 1 refills | Status: DC
  Filled 2021-09-26: qty 90, 90d supply, fill #0
  Filled 2021-12-22: qty 90, 90d supply, fill #1

## 2021-09-26 MED ORDER — METFORMIN HCL 1000 MG PO TABS
1000.0000 mg | ORAL_TABLET | Freq: Two times a day (BID) | ORAL | 1 refills | Status: DC
  Filled 2021-09-26 – 2021-11-04 (×2): qty 180, 90d supply, fill #0

## 2021-09-26 MED ORDER — DICLOFENAC SODIUM 75 MG PO TBEC
DELAYED_RELEASE_TABLET | Freq: Two times a day (BID) | ORAL | 2 refills | Status: DC
  Filled 2021-09-26: qty 60, 30d supply, fill #0

## 2021-09-26 MED ORDER — PANTOPRAZOLE SODIUM 40 MG PO TBEC
DELAYED_RELEASE_TABLET | Freq: Every day | ORAL | 1 refills | Status: DC
  Filled 2021-09-26: qty 90, fill #0

## 2021-09-26 MED ORDER — ATORVASTATIN CALCIUM 40 MG PO TABS
ORAL_TABLET | Freq: Every day | ORAL | 1 refills | Status: DC
  Filled 2021-09-26: qty 90, fill #0
  Filled 2021-12-14: qty 90, 90d supply, fill #0
  Filled 2022-03-14: qty 90, 90d supply, fill #1

## 2021-09-26 MED ORDER — LOSARTAN POTASSIUM-HCTZ 100-25 MG PO TABS
1.0000 | ORAL_TABLET | Freq: Every day | ORAL | 1 refills | Status: DC
  Filled 2021-09-26 – 2021-11-04 (×2): qty 90, 90d supply, fill #0
  Filled 2022-01-29: qty 90, 90d supply, fill #1

## 2021-09-26 NOTE — Progress Notes (Signed)
? ?Subjective:  ?Patient ID: Jerry Serrano, male    DOB: [birth year]  Age: 71 y.o. MRN: [id number] ? ?CC: Diabetes ? ? ?HPI ?Jerry Serrano is a 71 y.o. year old male with a history of hypertension, hyperlipidemia, type 2 diabetes mellitus (A1c 7.2) who presents today for a  follow-up visit.  ? ?Interval History: ?He is doing well but states he has been out of all his medications for 1 month except for Lantus.  At his last visit his glipizide has been discontinued due to hypoglycemia and since then he has had no repeat episodes.  His neuropathy is controlled on gabapentin. ?Blood sugars have been 130- 231. ? ?BP ranges 115-143/65-86 at home.  Denies presence of chest pain or dyspnea. ?He works at the [employer] and that is where he gets his major form of exercise. ? ? 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 ?Denies additional concerns today. ? ?Past Medical History:  ?Diagnosis Date  ? Allergy   ? Arthritis   ? Diabetes mellitus   ? Hyperlipidemia   ? Hypertension   ? ? ?Past Surgical History:  ?Procedure Laterality Date  ? HERNIA REPAIR    ? ? ?Family History  ?Problem Relation Age of Onset  ? Colon cancer Neg Hx   ? Esophageal cancer Neg Hx   ? Rectal cancer Neg Hx   ? Stomach cancer Neg Hx   ? ? ?Social History  ? ?Socioeconomic History  ? Marital status: Married  ?  Spouse name: Not on file  ? Number of children: Not on file  ? Years of education: Not on file  ? Highest education level: Not on file  ?Occupational History  ? Not on file  ?Tobacco Use  ? Smoking status: Former  ?  Types: Cigarettes  ? Smokeless tobacco: Never  ? Tobacco comments:  ?  over 30 years ago  ?Vaping Use  ? Vaping Use: Never used  ?Substance and Sexual Activity  ? Alcohol use: No  ? Drug use: No  ? Sexual activity: Not on file  ?Other Topics Concern  ? Not on file  ?Social History Narrative  ? Not on file  ? ?Social Determinants of Health  ? ?Financial Resource Strain: Low Risk   ?  Difficulty of Paying Living Expenses: Not hard at all  ?Food Insecurity: No Food Insecurity  ? Worried About Charity fundraiser in the Last Year: Never true  ? Ran Out of Food in the Last Year: Never true  ?Transportation Needs: No Transportation Needs  ? Lack of Transportation (Medical): No  ? Lack of Transportation (Non-Medical): No  ?Physical Activity: Inactive  ? Days of Exercise per Week: 0 days  ? Minutes of Exercise per Session: 0 min  ?Stress: No Stress Concern Present  ? Feeling of Stress : Not at all  ?Social Connections: Socially Isolated  ? Frequency of Communication with Friends and Family: More than three times a week  ? Frequency of Social Gatherings with Friends and Family: More than three times a week  ? Attends Religious Services: Never  ? Active Member of Clubs or Organizations: No  ? Attends Archivist Meetings: Never  ? Marital Status: Widowed  ? ? ?No Known Allergies ? ?Outpatient Medications Prior to Visit  ?Medication Sig Dispense Refill  ? aspirin EC 81 MG tablet Take 1 tablet (81 mg total) by mouth daily. 30 tablet 3  ?  betamethasone dipropionate 0.05 % cream APPLY EXTERNALLY TO THE AFFECTED AREA TWICE DAILY 30 g 0  ? Blood Glucose Monitoring Suppl (ACCU-CHEK AVIVA PLUS) w/Device KIT USE AS DIRECTED 4 TIMES DAILY AFTER MEALS AND AT BEDTIME. 1 kit 0  ? glucose blood (ACCU-CHEK AVIVA PLUS) test strip USE 1 STRIP TO CHECK GLUCOSE THREE TIMES DAILY FOR BLOOD SUGAR --MUST  HAVE  OFFICE  VISIT  FOR  REFILLS 300 each 2  ? ketoconazole (NIZORAL) 2 % cream Apply 1 application topically daily. 15 g 0  ? Lancet Devices (ACCU-CHEK SOFTCLIX) lancets Use as instructed 1 each 12  ? MICROLET LANCETS MISC Test 3 times per day 100 each 12  ? olopatadine (PATANOL) 0.1 % ophthalmic solution Place 1 drop into both eyes 2 (two) times daily.    ? sildenafil (VIAGRA) 50 MG tablet Take 1 tablet (50 mg total) by mouth daily as needed for erectile dysfunction. 10 tablet 1  ? amLODipine (NORVASC) 10 MG  tablet Take 1 tablet (10 mg total) by mouth daily. 30 tablet 0  ? atorvastatin (LIPITOR) 40 MG tablet TAKE 1 TABLET (40 MG TOTAL) BY MOUTH DAILY. 30 tablet 0  ? diclofenac (VOLTAREN) 75 MG EC tablet TAKE 1 TABLET (75 MG TOTAL) BY MOUTH 2 (TWO) TIMES DAILY. 60 tablet 0  ? gabapentin (NEURONTIN) 300 MG capsule Take 1 capsule (300 mg total) by mouth daily. 30 capsule 6  ? insulin glargine (LANTUS) 100 UNIT/ML Solostar Pen Inject 25 Units into the skin daily. 30 mL 6  ? losartan-hydrochlorothiazide (HYZAAR) 100-25 MG tablet TAKE 1 TABLET BY MOUTH DAILY. *DISCONTINUE LOSARTAN 50/12.5* 30 tablet 0  ? metFORMIN (GLUCOPHAGE) 1000 MG tablet TAKE 1 AND 1/2 TABLETS ($RemoveBefo'1500MG'MEhmInJSnuz$ ) BY MOUTH IN THE MORNING AND 1 TABLET ($RemoveB'1000MG'NrxtAVfW$ ) IN THE EVENING. 75 tablet 0  ? pantoprazole (PROTONIX) 40 MG tablet TAKE 1 TABLET BY MOUTH DAILY. 30 tablet 0  ? ?Facility-Administered Medications Prior to Visit  ?Medication Dose Route Frequency Provider Last Rate Last Admin  ? 0.9 %  sodium chloride infusion  500 mL Intravenous Continuous Danis, Estill Cotta III, MD      ? ? ? ?ROS ?Review of Systems  ?Constitutional:  Negative for activity change and appetite change.  ?HENT:  Negative for sinus pressure and sore throat.   ?Eyes:  Negative for visual disturbance.  ?Respiratory:  Negative for cough, chest tightness and shortness of breath.   ?Cardiovascular:  Negative for chest pain and leg swelling.  ?Gastrointestinal:  Negative for abdominal distention, abdominal pain, constipation and diarrhea.  ?Endocrine: Negative.   ?Genitourinary:  Negative for dysuria.  ?Musculoskeletal:  Negative for joint swelling and myalgias.  ?Skin:  Negative for rash.  ?Allergic/Immunologic: Negative.   ?Neurological:  Negative for weakness, light-headedness and numbness.  ?Psychiatric/Behavioral:  Negative for dysphoric mood and suicidal ideas.   ? ?Objective:  ?BP (!) 142/74   Pulse 84   Ht $R'5\' 4"'RX$  (1.626 m)   Wt 147 lb (66.7 kg)   SpO2 100%   BMI 25.23 kg/m?  ? ?BP/Weight  09/26/2021 11/16/2020 08/16/2020  ?Systolic BP 818 563 149  ?Diastolic BP 74 74 73  ?Wt. (Lbs) 147 145 145.8  ?BMI 25.23 24.89 25.03  ? ? ? ? ?Physical Exam ?Constitutional:   ?   Appearance: He is well-developed.  ?Cardiovascular:  ?   Rate and Rhythm: Normal rate.  ?   Heart sounds: Normal heart sounds. No murmur heard. ?Pulmonary:  ?   Effort: Pulmonary effort is normal.  ?  Breath sounds: Normal breath sounds. No wheezing or rales.  ?Chest:  ?   Chest wall: No tenderness.  ?Abdominal:  ?   General: Bowel sounds are normal. There is no distension.  ?   Palpations: Abdomen is soft. There is no mass.  ?   Tenderness: There is no abdominal tenderness.  ?Musculoskeletal:     ?   General: Normal range of motion.  ?   Right lower leg: No edema.  ?   Left lower leg: No edema.  ?Neurological:  ?   Mental Status: He is alert and oriented to person, place, and time.  ?Psychiatric:     ?   Mood and Affect: Mood normal.  ? ? ?Diabetic Foot Exam - Simple   ?Simple Foot Form ?Diabetic Foot exam was performed with the following findings: Yes 09/26/2021  2:51 PM  ?Visual Inspection ?No deformities, no ulcerations, no other skin breakdown bilaterally: Yes ?Sensation Testing ?Intact to touch and monofilament testing bilaterally: Yes ?Pulse Check ?Posterior Tibialis and Dorsalis pulse intact bilaterally: Yes ?Comments ?  ? ? ?CMP Latest Ref Rng & Units 11/16/2020 05/13/2020 11/03/2019  ?Glucose 65 - 99 mg/dL 140(H) 131(H) 151(H)  ?BUN 8 - 27 mg/dL $Remove'19 17 19  'XulWLMV$ ?Creatinine 0.76 - 1.27 mg/dL 1.33(H) 1.04 0.99  ?Sodium 134 - 144 mmol/L 143 138 140  ?Potassium 3.5 - 5.2 mmol/L 4.9 4.7 4.1  ?Chloride 96 - 106 mmol/L 105 100 104  ?CO2 20 - 29 mmol/L $RemoveB'23 25 21  'oCqOxMtk$ ?Calcium 8.6 - 10.2 mg/dL 10.0 10.2 9.6  ?Total Protein 6.0 - 8.5 g/dL 7.0 7.8 -  ?Total Bilirubin 0.0 - 1.2 mg/dL <0.2 0.5 -  ?Alkaline Phos 44 - 121 IU/L 81 83 -  ?AST 0 - 40 IU/L 21 25 -  ?ALT 0 - 44 IU/L 26 24 -  ? ? ?Lipid Panel  ?   ?Component Value Date/Time  ? CHOL 131 11/16/2020 1042   ? TRIG 65 11/16/2020 1042  ? HDL 44 11/16/2020 1042  ? CHOLHDL 3.0 11/16/2020 1042  ? CHOLHDL 2.8 02/29/2016 1018  ? VLDL 12 02/29/2016 1018  ? [place] 73 11/16/2020 1042  ? ? ?CBC ?   ?Component Value Date/Time

## 2021-09-26 NOTE — Patient Instructions (Signed)

## 2021-09-27 ENCOUNTER — Other Ambulatory Visit: Payer: Self-pay | Admitting: Family Medicine

## 2021-09-27 ENCOUNTER — Other Ambulatory Visit: Payer: Self-pay

## 2021-09-27 LAB — CMP14+EGFR
ALT: 26 IU/L (ref 0–44)
AST: 29 IU/L (ref 0–40)
Albumin/Globulin Ratio: 1.7 (ref 1.2–2.2)
Albumin: 4.8 g/dL — ABNORMAL HIGH (ref 3.7–4.7)
Alkaline Phosphatase: 95 IU/L (ref 44–121)
BUN/Creatinine Ratio: 15 (ref 10–24)
BUN: 26 mg/dL (ref 8–27)
Bilirubin Total: 0.3 mg/dL (ref 0.0–1.2)
CO2: 18 mmol/L — ABNORMAL LOW (ref 20–29)
Calcium: 9.9 mg/dL (ref 8.6–10.2)
Chloride: 109 mmol/L — ABNORMAL HIGH (ref 96–106)
Creatinine, Ser: 1.78 mg/dL — ABNORMAL HIGH (ref 0.76–1.27)
Globulin, Total: 2.9 g/dL (ref 1.5–4.5)
Glucose: 129 mg/dL — ABNORMAL HIGH (ref 70–99)
Potassium: 4.9 mmol/L (ref 3.5–5.2)
Sodium: 142 mmol/L (ref 134–144)
Total Protein: 7.7 g/dL (ref 6.0–8.5)
eGFR: 40 mL/min/{1.73_m2} — ABNORMAL LOW (ref >59)

## 2021-09-27 LAB — MICROALBUMIN / CREATININE URINE RATIO
Creatinine, Urine: 90 mg/dL
Microalb/Creat Ratio: 38 mg/g creat — ABNORMAL HIGH (ref 0–29)
Microalbumin, Urine: 33.8 ug/mL

## 2021-09-27 MED ORDER — TRAMADOL HCL 50 MG PO TABS
50.0000 mg | ORAL_TABLET | Freq: Every evening | ORAL | 1 refills | Status: DC | PRN
  Filled 2021-09-27: qty 7, 7d supply, fill #0
  Filled 2021-10-22: qty 7, 7d supply, fill #1

## 2021-09-28 ENCOUNTER — Other Ambulatory Visit: Payer: Self-pay

## 2021-09-29 ENCOUNTER — Other Ambulatory Visit: Payer: Self-pay

## 2021-09-30 ENCOUNTER — Telehealth: Payer: Self-pay | Admitting: Family Medicine

## 2021-09-30 NOTE — Telephone Encounter (Signed)
Copied from [place] [phone]. Topic: Quick Communication - Lab Results (Clinic Use ONLY) ?>> Sep 29, 2021  3:37 PM Gomez Cleverly, CMA wrote: ?Ok to give lab results when patient returns call. ? ?Patient called back needing lab results can be reached at Ph# [phone] ?

## 2021-10-03 ENCOUNTER — Other Ambulatory Visit: Payer: Self-pay | Admitting: *Deleted

## 2021-10-03 NOTE — Patient Outreach (Signed)
[facility]) Care Management ?RN Health Coach Note ? ? ?10/03/2021 ?Name:  Jerry Serrano MRN:  [id number] DOB:  [birth year] ? ?Summary: ?Blood sugar is 155 today. A1C is 7.2 Per patient he has all his medications. He is taking his medications as per ordered.  ? ?Recommendations/Changes made from today's visit: ?Keep all Dr appointments ?Monitor blood sugars as per ordered ?Take medications as per ordered ?Refill medications before they run out ? ? ?Subjective: ?Jerry Serrano is an 71 y.o. year old male who is a primary patient of Charlott Rakes, MD. The care management team was consulted for assistance with care management and/or care coordination needs.   ? ?RN Health Coach completed Telephone Visit today.  ? ?Objective: ? ?Medications Reviewed Today   ? ? Reviewed by Charlott Rakes, MD (Physician) on 09/26/21 at 1500  Med List Status: <None>  ? ?Medication Order Taking? Sig Documenting Provider Last Dose Status Informant  ?0.9 %  sodium chloride infusion [id number]   Nelida Meuse III, MD  Active   ?amLODipine (NORVASC) 10 MG tablet [id number]  Take 1 tablet (10 mg total) by mouth daily. Charlott Rakes, MD  Active   ?aspirin EC 81 MG tablet [id number] Yes Take 1 tablet (81 mg total) by mouth daily. Charlott Rakes, MD Taking Active   ?atorvastatin (LIPITOR) 40 MG tablet [id number]  TAKE 1 TABLET (40 MG TOTAL) BY MOUTH DAILY. Charlott Rakes, MD  Active   ?betamethasone dipropionate 0.05 % cream [id number] Yes APPLY EXTERNALLY TO THE AFFECTED AREA TWICE DAILY Maximiano Coss, NP Taking Active   ?Blood Glucose Monitoring Suppl (ACCU-CHEK AVIVA PLUS) w/Device KIT [id number] Yes USE AS DIRECTED 4 TIMES DAILY AFTER MEALS AND AT BEDTIME. Charlott Rakes, MD Taking Active   ?diclofenac (VOLTAREN) 75 MG EC tablet [id number]  TAKE 1 TABLET (75 MG TOTAL) BY MOUTH 2 (TWO) TIMES DAILY. Charlott Rakes, MD  Active   ?gabapentin (NEURONTIN) 300 MG capsule [id number]  Take 1 capsule (300 mg total) by mouth daily.  Charlott Rakes, MD  Active   ?glucose blood (ACCU-CHEK AVIVA PLUS) test strip [id number] Yes USE 1 STRIP TO CHECK GLUCOSE THREE TIMES DAILY FOR BLOOD SUGAR --MUST  HAVE  OFFICE  VISIT  FOR  REFILLS Charlott Rakes, MD Taking Active   ?insulin glargine (LANTUS) 100 UNIT/ML Solostar Pen [id number]  Inject 25 Units into the skin daily. Charlott Rakes, MD  Active   ?ketoconazole (NIZORAL) 2 % cream [id number] Yes Apply 1 application topically daily. Maximiano Coss, NP Taking Active   ?Lancet Devices (ACCU-CHEK [place]) lancets [id number] Yes Use as instructed Tresa Garter, MD Taking Active   ?losartan-hydrochlorothiazide (HYZAAR) 100-25 MG tablet [id number]  Take 1 tablet by mouth daily. Charlott Rakes, MD  Active   ?metFORMIN (GLUCOPHAGE) 1000 MG tablet [id number]  Take 1 tablet (1,000 mg total) by mouth 2 (two) times daily with a meal. Charlott Rakes, MD  Active   ?MICROLET LANCETS MISC [id number] Yes Test 3 times per day Tresa Garter, MD Taking Active   ?olopatadine (PATANOL) 0.1 % ophthalmic solution [id number] Yes Place 1 drop into both eyes 2 (two) times daily. [provider] Taking Active   ?pantoprazole (PROTONIX) 40 MG tablet [id number]  TAKE 1 TABLET BY MOUTH DAILY. Charlott Rakes, MD  Active   ?sildenafil (VIAGRA) 50 MG tablet [id number] Yes Take 1 tablet (50 mg total) by mouth daily as needed for erectile dysfunction. Charlott Rakes, MD Taking Active   ? ?  ?  ? ?  ? ? ? ?  SDOH:  (Social Determinants of Health) assessments and interventions performed:  ?SDOH Interventions   ? ?Flowsheet Row Most Recent Value  ?SDOH Interventions   ?Food Insecurity Interventions Intervention Not Indicated  ?Housing Interventions Intervention Not Indicated  ?Transportation Interventions Intervention Not Indicated  ? ?  ? ? ?Care Plan ? ?Review of patient past medical history, allergies, medications, health status, including review of consultants reports, laboratory and other test data, was performed as  part of comprehensive evaluation for care management services.  ? ?Care Plan : RN Care Manager Plan of Care  ?Updates made by Adriaan Maltese, Eppie Gibson, RN since 10/03/2021 12:00 AM  ?  ? ?Problem: Knowledge Deficit Related to Diabetes , HTN and Care Coordination Needs   ?Priority: High  ?  ? ?Long-Range Goal: velopment Plan of Care for Management of Diabetes,and HTN   ?Start Date: 07/15/2021  ?Expected End Date: 07/08/2022  ?Priority: High  ?Note:   ?Current Barriers:  ?Knowledge Deficits related to plan of care for management of HTN and DMII  ? ?RNCM Clinical Goal(s):  ?Patient will verbalize understanding of plan for management of HTN and DMII as evidenced by continuation monitoring blood pressure and blood glucose. Adhering to a diabetic and low sodium diet  through collaboration with RN Care manager, provider, and care team.  ? ?Interventions: ?Inter-disciplinary care team collaboration (see longitudinal plan of care) ?Evaluation of current treatment plan related to  self management and patient's adherence to plan as established by provider ? ?Patient Goals/Self-Care Activities: ?Take medications as prescribed   ?Attend all scheduled provider appointments ?Call pharmacy for medication refills 3-7 days in advance of running out of medications ?Perform all self care activities independently  ?Perform IADL's (shopping, preparing meals, housekeeping, managing finances) independently ?Call provider office for new concerns or questions  ?call the Suicide and Crisis Lifeline: 988 if experiencing a Mental Health or [place]  ?check blood sugar at prescribed times: once daily ?check feet daily for cuts, sores or redness ?take the blood sugar meter to all doctor visits ?trim toenails straight across ?drink 6 to 8 glasses of water each day ?set a realistic goal ?check blood pressure daily ?keep a blood pressure log ?take blood pressure log to all doctor appointments ?call doctor for signs and symptoms of high blood  pressure ?develop an action plan for high blood pressure ?keep all doctor appointments ?take medications for blood pressure exactly as prescribed ?report new symptoms to your doctor ?limit salt intake to 2300 mg/day ?  ?[id number] Per patient his blood sugar was 155 non fasting this am. Patient A1C 7.2. Per patient his job is strenuous, that is his exercise. Patient is now currently taking all his medications as per ordered.  His appetite is good. RN discussed with patient about keeping appointments. Patient will take medications as per ordered and calling for medications before they run out.  ?  ?  ? ?Plan: Telephone follow up appointment with care management team member scheduled for:  January 02, 2022 ?The patient has been provided with contact information for the care management team and has been advised to call with any health related questions or concerns.  ? ?Johny Shock BSN RN ?[facility] ?[phone] ? ? ? ?  ?

## 2021-10-03 NOTE — Patient Instructions (Signed)
Visit Information ? ?Thank you for taking time to visit with me today. Please don't hesitate to contact me if I can be of assistance to you before our next scheduled telephone appointment. ? ?Following are the goals we discussed today:  ?urrent Barriers:  ?Knowledge Deficits related to plan of care for management of HTN and DMII  ? ?RNCM Clinical Goal(s):  ?Patient will verbalize understanding of plan for management of HTN and DMII as evidenced by continuation monitoring blood pressure and blood glucose. Adhering to a diabetic and low sodium diet through collaboration with RN Care manager, provider, and care team.  ? ?Interventions: ?Inter-disciplinary care team collaboration (see longitudinal plan of care) ?Evaluation of current treatment plan related to  self management and patient's adherence to plan as established by provider ? ?Patient Goals/Self-Care Activities: ?Take medications as prescribed   ?Attend all scheduled provider appointments ?Call pharmacy for medication refills 3-7 days in advance of running out of medications ?Perform all self care activities independently  ?Perform IADL's (shopping, preparing meals, housekeeping, managing finances) independently ?Call provider office for new concerns or questions  ?call the Suicide and Crisis Lifeline: 988 if experiencing a Mental Health or [place]  ?check blood sugar at prescribed times: once daily ?check feet daily for cuts, sores or redness ?take the blood sugar meter to all doctor visits ?trim toenails straight across ?drink 6 to 8 glasses of water each day ?set a realistic goal ?check blood pressure daily ?keep a blood pressure log ?take blood pressure log to all doctor appointments ?call doctor for signs and symptoms of high blood pressure ?develop an action plan for high blood pressure ?keep all doctor appointments ?take medications for blood pressure exactly as prescribed ?report new symptoms to your doctor ?limit salt intake to 2300  mg/day ?  ?[id number] Per patient his blood sugar was 155 non fasting this am. Patient A1C 7.2. Per patient his job is strenuous, that is his exercise. Patient is now currently taking all his medications as per ordered.  His appetite is good. RN discussed with patient about keeping appointments. Patient will take medications as per ordered and calling for medications before they run out.  ? ?Our next appointment is by telephone on January 02, 2022 ? ?Please call Johny Shock RN [phone] if you need to cancel or reschedule your appointment.  ? ?Please call the Suicide and Crisis Lifeline: 988 if you are experiencing a Mental Health or [place] or need someone to talk to. ? ?The patient verbalized understanding of instructions, educational materials, and care plan provided today and agreed to receive a mailed copy of patient instructions, educational materials, and care plan.  ? ?Telephone follow up appointment with care management team member scheduled for: ?The patient has been provided with contact information for the care management team and has been advised to call with any health related questions or concerns.  ? ?SIGNATURE ? ?Johny Shock BSN RN ?[facility] ?[phone] ? ? ?  ?

## 2021-10-04 NOTE — Telephone Encounter (Signed)
See result notes. 

## 2021-10-22 ENCOUNTER — Other Ambulatory Visit: Payer: Self-pay | Admitting: Family Medicine

## 2021-10-22 DIAGNOSIS — I10 Essential (primary) hypertension: Secondary | ICD-10-CM

## 2021-10-22 DIAGNOSIS — K219 Gastro-esophageal reflux disease without esophagitis: Secondary | ICD-10-CM

## 2021-10-24 ENCOUNTER — Other Ambulatory Visit: Payer: Self-pay

## 2021-10-24 MED ORDER — PANTOPRAZOLE SODIUM 40 MG PO TBEC
DELAYED_RELEASE_TABLET | Freq: Every day | ORAL | 2 refills | Status: DC
  Filled 2021-10-24: qty 30, 30d supply, fill #0
  Filled 2021-12-29: qty 30, 30d supply, fill #1
  Filled 2022-01-29: qty 30, 30d supply, fill #2

## 2021-10-24 NOTE — Telephone Encounter (Addendum)
Unable to reach pharmacy to confirm pt has refills remaining on Norvasc and Hyzaar. ?

## 2021-10-24 NOTE — Telephone Encounter (Signed)
Has current rx for Norvasc and Hyzaar. ?Requested Prescriptions  ?Pending Prescriptions Disp Refills  ?? pantoprazole (PROTONIX) 40 MG tablet 30 tablet 2  ?  Sig: TAKE 1 TABLET BY MOUTH DAILY.  ?  ? Gastroenterology: Proton Pump Inhibitors Passed - 10/22/2021  2:27 AM  ?  ?  Passed - Valid encounter within last 12 months  ?  Recent Outpatient Visits   ?      ? 4 weeks ago Type 2 diabetes mellitus with hypoglycemia without coma, with long-term current use of insulin ([place])  ? [place], Charlane Ferretti, MD  ? 8 months ago Type 2 diabetes mellitus with diabetic polyneuropathy, with long-term current use of insulin ([place])  ? [place], Charlane Ferretti, MD  ? 11 months ago Type 2 diabetes mellitus with diabetic polyneuropathy, with long-term current use of insulin ([place])  ? [place] [facility] [place], Charlane Ferretti, MD  ? 1 year ago Type 2 diabetes mellitus with diabetic polyneuropathy, with long-term current use of insulin ([place])  ? [place], RPH-CPP  ? 1 year ago Type 2 diabetes mellitus with diabetic polyneuropathy, with long-term current use of insulin ([place])  ? [place], RPH-CPP  ?  ?  ?Future Appointments   ?        ? In 2 months Charlott Rakes, MD [place]  ?  ? ?  ?  ?  ?Refused Prescriptions Disp Refills  ?? amLODipine (NORVASC) 10 MG tablet 30 tablet 0  ?  Sig: Take 1 tablet (10 mg total) by mouth daily.  ?  ? Cardiovascular: Calcium Channel Blockers 2 Failed - 10/22/2021  2:27 AM  ?  ?  Failed - Last BP in normal range  ?  BP Readings from Last 1 Encounters:  ?09/26/21 (!) 142/74  ?   ?  ?  Passed - Last Heart Rate in normal range  ?  Pulse Readings from Last 1 Encounters:  ?09/26/21 84  ?   ?  ?  Passed - Valid encounter within last 6 months  ?  Recent Outpatient Visits   ?      ? 4 weeks ago  Type 2 diabetes mellitus with hypoglycemia without coma, with long-term current use of insulin ([place])  ? [place], Charlane Ferretti, MD  ? 8 months ago Type 2 diabetes mellitus with diabetic polyneuropathy, with long-term current use of insulin ([place])  ? [place], Charlane Ferretti, MD  ? 11 months ago Type 2 diabetes mellitus with diabetic polyneuropathy, with long-term current use of insulin ([place])  ? [place] [facility] [place], Charlane Ferretti, MD  ? 1 year ago Type 2 diabetes mellitus with diabetic polyneuropathy, with long-term current use of insulin ([place])  ? [place], RPH-CPP  ? 1 year ago Type 2 diabetes mellitus with diabetic polyneuropathy, with long-term current use of insulin ([place])  ? [place], RPH-CPP  ?  ?  ?Future Appointments   ?        ? In 2 months Charlott Rakes, MD [place]  ?  ? ?  ?  ?  ?? losartan-hydrochlorothiazide (HYZAAR) 100-25 MG tablet 30 tablet 0  ?  Sig: TAKE 1 TABLET BY MOUTH DAILY. *DISCONTINUE LOSARTAN 50/12.5*  ?  ? Cardiovascular: ARB + Diuretic Combos Failed - 10/22/2021  2:27 AM  ?  ?  Failed - Cr in normal range and within 180 days  ?  Creat  ?Date Value Ref Range Status  ?09/05/2016 0.90 0.70 - 1.25 mg/dL Final  ?  Comment:  ?    ?For patients > or = 73 years of age: The upper reference limit for ?Creatinine is approximately 13% higher for people identified as ?African-American. ?  ?  ? ?Creatinine, Ser  ?Date Value Ref Range Status  ?09/26/2021 1.78 (H) 0.76 - 1.27 mg/dL Final  ? ?Creatinine, Urine  ?Date Value Ref Range Status  ?09/05/2016 378 (H) 20 - 370 mg/dL Final  ?  Comment:  ?  Result repeated and verified. ?Result confirmed by automatic dilution. ?  ?   ?  ?  Failed - Last BP in normal range  ?  BP Readings from Last 1 Encounters:  ?09/26/21 (!) 142/74   ?   ?  ?  Passed - K in normal range and within 180 days  ?  Potassium  ?Date Value Ref Range Status  ?09/26/2021 4.9 3.5 - 5.2 mmol/L Final  ?   ?  ?  Passed - Na in normal range and within 180 days  ?  Sodium  ?Date Value Ref Range Status  ?09/26/2021 142 134 - 144 mmol/L Final  ?   ?  ?  Passed - eGFR is 10 or above and within 180 days  ?  GFR, Est African American  ?Date Value Ref Range Status  ?09/05/2016 >89 >=60 mL/min Final  ? ?GFR calc Af Wyvonnia Lora  ?Date Value Ref Range Status  ?05/13/2020 83 >59 mL/min/1.73 Final  ?  Comment:  ?  **In accordance with recommendations from the NKF-ASN Task force,** ?  [facility] is in the process of updating its eGFR calculation to the ?  2021 CKD-EPI creatinine equation that estimates kidney function ?  without a race variable. ?  ? ?GFR, Est Non African American  ?Date Value Ref Range Status  ?09/05/2016 88 >=60 mL/min Final  ? ?GFR calc non Af Amer  ?Date Value Ref Range Status  ?05/13/2020 72 >59 mL/min/1.73 Final  ? ?eGFR  ?Date Value Ref Range Status  ?09/26/2021 40 (L) >59 mL/min/1.73 Final  ?   ?  ?  Passed - Patient is not pregnant  ?  ?  Passed - Valid encounter within last 6 months  ?  Recent Outpatient Visits   ?      ? 4 weeks ago Type 2 diabetes mellitus with hypoglycemia without coma, with long-term current use of insulin ([place])  ? [place], Charlane Ferretti, MD  ? 8 months ago Type 2 diabetes mellitus with diabetic polyneuropathy, with long-term current use of insulin ([place])  ? [place], Charlane Ferretti, MD  ? 11 months ago Type 2 diabetes mellitus with diabetic polyneuropathy, with long-term current use of insulin ([place])  ? [facility] [place], Charlane Ferretti, MD  ? 1 year ago Type 2 diabetes mellitus with diabetic polyneuropathy, with long-term current use of insulin ([place])  ? [place], RPH-CPP  ? 1 year ago Type 2 diabetes  mellitus with diabetic polyneuropathy, with long-term current use of insulin ([place])  ? [place],  Jarome [place]atin, RPH-CPP  ?  ?  ?Future Appointments   ?        ? In 2 months Charlott Rakes, MD [place]  ?  ? ?  ?  ?  ? ? ?

## 2021-11-02 ENCOUNTER — Other Ambulatory Visit: Payer: Self-pay | Admitting: Family Medicine

## 2021-11-02 DIAGNOSIS — I10 Essential (primary) hypertension: Secondary | ICD-10-CM

## 2021-11-03 NOTE — Telephone Encounter (Signed)
Requested medication (s) are due for refill today: no ? ?Requested medication (s) are on the active medication list: yes ? ?Last refill:  09/26/21 ? ?Future visit scheduled: yes ? ?Notes to clinic:  Unable to refill per protocol, medication was refilled 09/26/21 for 90 and 1 refill. Request is too soon. ? ? ?  ?Requested Prescriptions  ?Pending Prescriptions Disp Refills  ? amLODipine (NORVASC) 10 MG tablet 30 tablet 0  ?  Sig: Take 1 tablet (10 mg total) by mouth daily.  ?  ? Cardiovascular: Calcium Channel Blockers 2 Failed - 11/02/2021 11:14 PM  ?  ?  Failed - Last BP in normal range  ?  BP Readings from Last 1 Encounters:  ?09/26/21 (!) 142/74  ?  ?  ?  ?  Passed - Last Heart Rate in normal range  ?  Pulse Readings from Last 1 Encounters:  ?09/26/21 84  ?  ?  ?  ?  Passed - Valid encounter within last 6 months  ?  Recent Outpatient Visits   ? ?      ? 1 month ago Type 2 diabetes mellitus with hypoglycemia without coma, with long-term current use of insulin ([place])  ? [place], Charlane Ferretti, MD  ? 8 months ago Type 2 diabetes mellitus with diabetic polyneuropathy, with long-term current use of insulin ([place])  ? [place], Charlane Ferretti, MD  ? 11 months ago Type 2 diabetes mellitus with diabetic polyneuropathy, with long-term current use of insulin ([place])  ? [place] [facility] [place], Charlane Ferretti, MD  ? 1 year ago Type 2 diabetes mellitus with diabetic polyneuropathy, with long-term current use of insulin ([place])  ? [place], RPH-CPP  ? 1 year ago Type 2 diabetes mellitus with diabetic polyneuropathy, with long-term current use of insulin ([place])  ? [place], RPH-CPP  ? ?  ?  ?Future Appointments   ? ?        ? In 2 months Charlott Rakes, MD [place]  ? ?  ? ? ?  ?  ?  ?  losartan-hydrochlorothiazide (HYZAAR) 100-25 MG tablet 30 tablet 0  ?  Sig: TAKE 1 TABLET BY MOUTH DAILY. *DISCONTINUE LOSARTAN 50/12.5*  ?  ? Cardiovascular: ARB + Diuretic Combos Failed - 11/02/2021 11:14 PM  ?  ?  Failed - Cr in normal range and within 180 days  ?  Creat  ?Date Value Ref Range Status  ?09/05/2016 0.90 0.70 - 1.25 mg/dL Final  ?  Comment:  ?    ?For patients > or = 72 years of age: The upper reference limit for ?Creatinine is approximately 13% higher for people identified as ?African-American. ?  ?  ? ?Creatinine, Ser  ?Date Value Ref Range Status  ?09/26/2021 1.78 (H) 0.76 - 1.27 mg/dL Final  ? ?Creatinine, Urine  ?Date Value Ref Range Status  ?09/05/2016 378 (H) 20 - 370 mg/dL Final  ?  Comment:  ?  Result repeated and verified. ?Result confirmed by automatic dilution. ?  ?  ?  ?  ?  Failed - Last BP in normal range  ?  BP Readings from Last 1 Encounters:  ?09/26/21 (!) 142/74  ?  ?  ?  ?  Passed - K in normal range and within 180 days  ?  Potassium  ?Date  Value Ref Range Status  ?09/26/2021 4.9 3.5 - 5.2 mmol/L Final  ?  ?  ?  ?  Passed - Na in normal range and within 180 days  ?  Sodium  ?Date Value Ref Range Status  ?09/26/2021 142 134 - 144 mmol/L Final  ?  ?  ?  ?  Passed - eGFR is 10 or above and within 180 days  ?  GFR, Est African American  ?Date Value Ref Range Status  ?09/05/2016 >89 >=60 mL/min Final  ? ?GFR calc Af Wyvonnia Lora  ?Date Value Ref Range Status  ?05/13/2020 83 >59 mL/min/1.73 Final  ?  Comment:  ?  **In accordance with recommendations from the NKF-ASN Task force,** ?  [facility] is in the process of updating its eGFR calculation to the ?  2021 CKD-EPI creatinine equation that estimates kidney function ?  without a race variable. ?  ? ?GFR, Est Non African American  ?Date Value Ref Range Status  ?09/05/2016 88 >=60 mL/min Final  ? ?GFR calc non Af Amer  ?Date Value Ref Range Status  ?05/13/2020 72 >59 mL/min/1.73 Final  ? ?eGFR  ?Date Value Ref Range Status  ?09/26/2021 40 (L) >59  mL/min/1.73 Final  ?  ?  ?  ?  Passed - Patient is not pregnant  ?  ?  Passed - Valid encounter within last 6 months  ?  Recent Outpatient Visits   ? ?      ? 1 month ago Type 2 diabetes mellitus with hypoglycemia without coma, with long-term current use of insulin ([place])  ? [place], Charlane Ferretti, MD  ? 8 months ago Type 2 diabetes mellitus with diabetic polyneuropathy, with long-term current use of insulin ([place])  ? [place], Charlane Ferretti, MD  ? 11 months ago Type 2 diabetes mellitus with diabetic polyneuropathy, with long-term current use of insulin ([place])  ? [place] [facility] [place], Charlane Ferretti, MD  ? 1 year ago Type 2 diabetes mellitus with diabetic polyneuropathy, with long-term current use of insulin ([place])  ? [place], RPH-CPP  ? 1 year ago Type 2 diabetes mellitus with diabetic polyneuropathy, with long-term current use of insulin ([place])  ? [place], RPH-CPP  ? ?  ?  ?Future Appointments   ? ?        ? In 2 months Charlott Rakes, MD [place]  ? ?  ? ? ?  ?  ?  ? ? ?

## 2021-11-04 ENCOUNTER — Other Ambulatory Visit: Payer: Self-pay

## 2021-11-07 ENCOUNTER — Other Ambulatory Visit: Payer: Self-pay

## 2021-11-22 ENCOUNTER — Other Ambulatory Visit: Payer: Self-pay

## 2021-11-24 ENCOUNTER — Other Ambulatory Visit: Payer: Self-pay

## 2021-11-28 ENCOUNTER — Other Ambulatory Visit: Payer: Self-pay

## 2021-12-14 ENCOUNTER — Ambulatory Visit: Payer: Medicare Other | Admitting: Family Medicine

## 2021-12-15 ENCOUNTER — Other Ambulatory Visit: Payer: Self-pay

## 2021-12-22 ENCOUNTER — Other Ambulatory Visit: Payer: Self-pay

## 2021-12-23 ENCOUNTER — Other Ambulatory Visit: Payer: Self-pay

## 2021-12-27 ENCOUNTER — Other Ambulatory Visit: Payer: Self-pay

## 2021-12-29 ENCOUNTER — Other Ambulatory Visit: Payer: Self-pay

## 2021-12-30 ENCOUNTER — Other Ambulatory Visit: Payer: Self-pay

## 2021-12-30 ENCOUNTER — Other Ambulatory Visit: Payer: Self-pay | Admitting: Family Medicine

## 2021-12-30 DIAGNOSIS — M19049 Primary osteoarthritis, unspecified hand: Secondary | ICD-10-CM

## 2022-01-02 ENCOUNTER — Other Ambulatory Visit: Payer: Self-pay | Admitting: *Deleted

## 2022-01-17 ENCOUNTER — Ambulatory Visit: Payer: Medicare Other | Attending: Family Medicine | Admitting: Family Medicine

## 2022-01-17 ENCOUNTER — Other Ambulatory Visit: Payer: Self-pay

## 2022-01-17 ENCOUNTER — Encounter: Payer: Self-pay | Admitting: Family Medicine

## 2022-01-17 VITALS — BP 155/78 | HR 96 | Ht 64.0 in | Wt 147.0 lb

## 2022-01-17 DIAGNOSIS — E1169 Type 2 diabetes mellitus with other specified complication: Secondary | ICD-10-CM

## 2022-01-17 DIAGNOSIS — E785 Hyperlipidemia, unspecified: Secondary | ICD-10-CM | POA: Diagnosis not present

## 2022-01-17 DIAGNOSIS — I129 Hypertensive chronic kidney disease with stage 1 through stage 4 chronic kidney disease, or unspecified chronic kidney disease: Secondary | ICD-10-CM | POA: Diagnosis not present

## 2022-01-17 DIAGNOSIS — N1832 Chronic kidney disease, stage 3b: Secondary | ICD-10-CM | POA: Diagnosis not present

## 2022-01-17 DIAGNOSIS — Z794 Long term (current) use of insulin: Secondary | ICD-10-CM

## 2022-01-17 DIAGNOSIS — Z23 Encounter for immunization: Secondary | ICD-10-CM

## 2022-01-17 DIAGNOSIS — E1122 Type 2 diabetes mellitus with diabetic chronic kidney disease: Secondary | ICD-10-CM

## 2022-01-17 DIAGNOSIS — E11649 Type 2 diabetes mellitus with hypoglycemia without coma: Secondary | ICD-10-CM | POA: Diagnosis not present

## 2022-01-17 DIAGNOSIS — E1142 Type 2 diabetes mellitus with diabetic polyneuropathy: Secondary | ICD-10-CM | POA: Diagnosis not present

## 2022-01-17 DIAGNOSIS — M19249 Secondary osteoarthritis, unspecified hand: Secondary | ICD-10-CM | POA: Diagnosis not present

## 2022-01-17 LAB — POCT GLYCOSYLATED HEMOGLOBIN (HGB A1C): HbA1c, POC (controlled diabetic range): 8.2 % — AB (ref 0.0–7.0)

## 2022-01-17 LAB — GLUCOSE, POCT (MANUAL RESULT ENTRY): POC Glucose: 104 mg/dl — AB (ref 70–99)

## 2022-01-17 MED ORDER — GABAPENTIN 300 MG PO CAPS
300.0000 mg | ORAL_CAPSULE | Freq: Every day | ORAL | 1 refills | Status: DC
  Filled 2022-01-17 – 2022-03-24 (×2): qty 90, 90d supply, fill #0
  Filled 2022-06-23: qty 90, 90d supply, fill #1

## 2022-01-17 MED ORDER — TRAMADOL HCL 50 MG PO TABS
50.0000 mg | ORAL_TABLET | Freq: Every evening | ORAL | 1 refills | Status: DC | PRN
  Filled 2022-01-17: qty 30, 30d supply, fill #0
  Filled 2022-05-09: qty 30, 30d supply, fill #1

## 2022-01-17 MED ORDER — METFORMIN HCL 1000 MG PO TABS
ORAL_TABLET | ORAL | 1 refills | Status: DC
  Filled 2022-01-17: qty 225, 90d supply, fill #0

## 2022-01-17 MED ORDER — INSULIN GLARGINE 100 UNIT/ML SOLOSTAR PEN
27.0000 [IU] | PEN_INJECTOR | Freq: Every day | SUBCUTANEOUS | 6 refills | Status: DC
  Filled 2022-01-17: qty 27, 100d supply, fill #0

## 2022-01-17 MED ORDER — ZOSTER VAC RECOMB ADJUVANTED 50 MCG/0.5ML IM SUSR: 0.5000 mL | Freq: Once | INTRAMUSCULAR | 0 refills | Status: AC

## 2022-01-17 NOTE — Patient Instructions (Signed)
Hypertension, Adult ?Hypertension is another name for high blood pressure. High blood pressure forces your heart to work harder to pump blood. This can cause problems over time. ?There are two numbers in a blood pressure reading. There is a top number (systolic) over a bottom number (diastolic). It is best to have a blood pressure that is below 120/80. ?What are the causes? ?The cause of this condition is not known. Some other conditions can lead to high blood pressure. ?What increases the risk? ?Some lifestyle factors can make you more likely to develop high blood pressure: ?Smoking. ?Not getting enough exercise or physical activity. ?Being overweight. ?Having too much fat, sugar, calories, or salt (sodium) in your diet. ?Drinking too much alcohol. ?Other risk factors include: ?Having any of these conditions: ?Heart disease. ?Diabetes. ?High cholesterol. ?Kidney disease. ?Obstructive sleep apnea. ?Having a family history of high blood pressure and high cholesterol. ?Age. The risk increases with age. ?Stress. ?What are the signs or symptoms? ?High blood pressure may not cause symptoms. Very high blood pressure (hypertensive crisis) may cause: ?Headache. ?Fast or uneven heartbeats (palpitations). ?Shortness of breath. ?Nosebleed. ?Vomiting or feeling like you may vomit (nauseous). ?Changes in how you see. ?Very bad chest pain. ?Feeling dizzy. ?Seizures. ?How is this treated? ?This condition is treated by making healthy lifestyle changes, such as: ?Eating healthy foods. ?Exercising more. ?Drinking less alcohol. ?Your doctor may prescribe medicine if lifestyle changes do not help enough and if: ?Your top number is above 130. ?Your bottom number is above 80. ?Your personal target blood pressure may vary. ?Follow these instructions at home: ?Eating and drinking ? ?If told, follow the DASH eating plan. To follow this plan: ?Fill one half of your plate at each meal with fruits and vegetables. ?Fill one fourth of your plate  at each meal with whole grains. Whole grains include whole-wheat pasta, brown rice, and whole-grain bread. ?Eat or drink low-fat dairy products, such as skim milk or low-fat yogurt. ?Fill one fourth of your plate at each meal with low-fat (lean) proteins. Low-fat proteins include fish, chicken without skin, eggs, beans, and tofu. ?Avoid fatty meat, cured and processed meat, or chicken with skin. ?Avoid pre-made or processed food. ?Limit the amount of salt in your diet to less than 1,500 mg each day. ?Do not drink alcohol if: ?Your doctor tells you not to drink. ?You are pregnant, may be pregnant, or are planning to become pregnant. ?If you drink alcohol: ?Limit how much you have to: ?0-1 drink a day for women. ?0-2 drinks a day for men. ?Know how much alcohol is in your drink. In the [place] one drink equals one 12 oz bottle of beer (355 mL), one 5 oz glass of wine (148 mL), or one 1? oz glass of hard liquor (44 mL). ?Lifestyle ? ?Work with your doctor to stay at a healthy weight or to lose weight. Ask your doctor what the best weight is for you. ?Get at least 30 minutes of exercise that causes your heart to beat faster (aerobic exercise) most days of the week. This may include walking, swimming, or biking. ?Get at least 30 minutes of exercise that strengthens your muscles (resistance exercise) at least 3 days a week. This may include lifting weights or doing Pilates. ?Do not smoke or use any products that contain nicotine or tobacco. If you need help quitting, ask your doctor. ?Check your blood pressure at home as told by your doctor. ?Keep all follow-up visits. ?Medicines ?Take over-the-counter and prescription medicines   only as told by your doctor. Follow directions carefully. ?Do not skip doses of blood pressure medicine. The medicine does not work as well if you skip doses. Skipping doses also puts you at risk for problems. ?Ask your doctor about side effects or reactions to medicines that you should watch  for. ?Contact a doctor if: ?You think you are having a reaction to the medicine you are taking. ?You have headaches that keep coming back. ?You feel dizzy. ?You have swelling in your ankles. ?You have trouble with your vision. ?Get help right away if: ?You get a very bad headache. ?You start to feel mixed up (confused). ?You feel weak or numb. ?You feel faint. ?You have very bad pain in your: ?Chest. ?Belly (abdomen). ?You vomit more than once. ?You have trouble breathing. ?These symptoms may be an emergency. Get help right away. Call 911. ?Do not wait to see if the symptoms will go away. ?Do not drive yourself to the hospital. ?Summary ?Hypertension is another name for high blood pressure. ?High blood pressure forces your heart to work harder to pump blood. ?For most people, a normal blood pressure is less than 120/80. ?Making healthy choices can help lower blood pressure. If your blood pressure does not get lower with healthy choices, you may need to take medicine. ?This information is not intended to replace advice given to you by your health care provider. Make sure you discuss any questions you have with your health care provider. ?Document Revised: 04/14/2021 Document Reviewed: 04/14/2021 ?Elsevier Patient Education ? 2023 [employer] ? ?

## 2022-01-17 NOTE — Progress Notes (Signed)
Subjective:  Patient ID: Jerry Serrano, male    DOB: 04/12/1951  Age: 71 y.o. MRN: [id number]  CC: Diabetes   HPI Jerry Serrano is a 71 y.o. year old male with a history of hypertension, hyperlipidemia, type 2 diabetes mellitus (A1c 8.2) who presents today for a  follow-up visit.   Interval History:  BP this morning was 117/71 but is 155/78 in the Clinic.  He is yet to take his antihypertensives today as he is fasting in anticipation of labs.  Fasting sugars have been 104- 167 and his A1c is 8.2 up from 7.2 previously.  Endorses medication adherence and his diet has not changed.  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 Denies visual concerns or numbness in extremities. He is out of his tramadol for his osteoarthritis and is requesting refills.  Endorses adherence with his statin.  Past Medical History:  Diagnosis Date   Allergy    Arthritis    Diabetes mellitus    Hyperlipidemia    Hypertension     Past Surgical History:  Procedure Laterality Date   HERNIA REPAIR      Family History  Problem Relation Age of Onset   Colon cancer Neg Hx    Esophageal cancer Neg Hx    Rectal cancer Neg Hx    Stomach cancer Neg Hx     Social History   Socioeconomic History   Marital status: Married    Spouse name: Not on file   Number of children: Not on file   Years of education: Not on file   Highest education level: Not on file  Occupational History   Not on file  Tobacco Use   Smoking status: Former    Types: Cigarettes   Smokeless tobacco: Never   Tobacco comments:    over 30 years ago  Vaping Use   Vaping Use: Never used  Substance and Sexual Activity   Alcohol use: No   Drug use: No   Sexual activity: Not on file  Other Topics Concern   Not on file  Social History Narrative   Not on file   Social Determinants of Health   Financial Resource Strain: Low Risk  (03/25/2021)   Overall Financial Resource Strain (CARDIA)     Difficulty of Paying Living Expenses: Not hard at all  Food Insecurity: No Food Insecurity (10/03/2021)   Hunger Vital Sign    Worried About Running Out of Food in the Last Year: Never true    Ran Out of Food in the Last Year: Never true  Transportation Needs: No Transportation Needs (10/03/2021)   PRAPARE - Administrator, Civil Service (Medical): No    Lack of Transportation (Non-Medical): No  Physical Activity: Inactive (03/25/2021)   Exercise Vital Sign    Days of Exercise per Week: 0 days    Minutes of Exercise per Session: 0 min  Stress: No Stress Concern Present (03/25/2021)   [employer] of Occupational Health - Occupational Stress Questionnaire    Feeling of Stress : Not at all  Social Connections: Socially Isolated (03/25/2021)   Social Connection and Isolation Panel [NHANES]    Frequency of Communication with Friends and Family: More than three times a week    Frequency of Social Gatherings with Friends and Family: More than three times a week    Attends Religious Services: Never    Database administrator or Organizations: No    Attends [employer]  or Organization Meetings: Never    Marital Status: Widowed    No Known Allergies  Outpatient Medications Prior to Visit  Medication Sig Dispense Refill   amLODipine (NORVASC) 10 MG tablet Take 1 tablet (10 mg total) by mouth daily. 90 tablet 1   aspirin EC 81 MG tablet Take 1 tablet (81 mg total) by mouth daily. 30 tablet 3   atorvastatin (LIPITOR) 40 MG tablet TAKE 1 TABLET (40 MG TOTAL) BY MOUTH DAILY. 90 tablet 1   betamethasone dipropionate 0.05 % cream APPLY EXTERNALLY TO THE AFFECTED AREA TWICE DAILY 30 g 0   Blood Glucose Monitoring Suppl (ACCU-CHEK AVIVA PLUS) w/Device KIT USE AS DIRECTED 4 TIMES DAILY AFTER MEALS AND AT BEDTIME. 1 kit 0   glucose blood (ACCU-CHEK AVIVA PLUS) test strip USE 1 STRIP TO CHECK GLUCOSE THREE TIMES DAILY FOR BLOOD SUGAR --MUST  HAVE  OFFICE  VISIT  FOR  REFILLS 300 each 2    ketoconazole (NIZORAL) 2 % cream Apply 1 application topically daily. 15 g 0   Lancet Devices (ACCU-CHEK SOFTCLIX) lancets Use as instructed 1 each 12   losartan-hydrochlorothiazide (HYZAAR) 100-25 MG tablet Take 1 tablet by mouth daily. 90 tablet 1   MICROLET LANCETS MISC Test 3 times per day 100 each 12   olopatadine (PATANOL) 0.1 % ophthalmic solution Place 1 drop into both eyes 2 (two) times daily.     pantoprazole (PROTONIX) 40 MG tablet TAKE 1 TABLET BY MOUTH DAILY. 90 tablet 1   pantoprazole (PROTONIX) 40 MG tablet TAKE 1 TABLET BY MOUTH DAILY. 30 tablet 2   sildenafil (VIAGRA) 50 MG tablet Take 1 tablet (50 mg total) by mouth daily as needed for erectile dysfunction. 10 tablet 1   gabapentin (NEURONTIN) 300 MG capsule Take 1 capsule (300 mg total) by mouth daily. 90 capsule 1   insulin glargine (LANTUS) 100 UNIT/ML Solostar Pen Inject 25 Units into the skin daily. 30 mL 6   losartan-hydrochlorothiazide (HYZAAR) 100-25 MG tablet TAKE 1 TABLET BY MOUTH DAILY. *DISCONTINUE LOSARTAN 50/12.5* 30 tablet 0   metFORMIN (GLUCOPHAGE) 1000 MG tablet TAKE 1 AND 1/2 TABLETS ($RemoveBefo'1500MG'YaaQmyUOnmn$ ) BY MOUTH IN THE MORNING AND 1 TABLET ($RemoveB'1000MG'VZOFWyyI$ ) IN THE EVENING. 75 tablet 0   metFORMIN (GLUCOPHAGE) 1000 MG tablet Take 1 tablet (1,000 mg total) by mouth 2 (two) times daily with a meal. 180 tablet 1   traMADol (ULTRAM) 50 MG tablet Take 1 tablet (50 mg total) by mouth at bedtime as needed. 30 tablet 1   amLODipine (NORVASC) 10 MG tablet Take 1 tablet (10 mg total) by mouth daily. 30 tablet 0   Facility-Administered Medications Prior to Visit  Medication Dose Route Frequency Provider Last Rate Last Admin   0.9 %  sodium chloride infusion  500 mL Intravenous Continuous Danis, Estill Cotta III, MD         ROS Review of Systems  Constitutional:  Negative for activity change and appetite change.  HENT:  Negative for sinus pressure and sore throat.   Eyes:  Negative for visual disturbance.  Respiratory:  Negative for cough,  chest tightness and shortness of breath.   Cardiovascular:  Negative for chest pain and leg swelling.  Gastrointestinal:  Negative for abdominal distention, abdominal pain, constipation and diarrhea.  Endocrine: Negative.   Genitourinary:  Negative for dysuria.  Musculoskeletal:  Negative for joint swelling and myalgias.  Skin:  Negative for rash.  Allergic/Immunologic: Negative.   Neurological:  Negative for weakness, light-headedness and numbness.  Psychiatric/Behavioral:  Negative  for dysphoric mood and suicidal ideas.     Objective:  BP (!) 155/78   Pulse 96   Ht $R'5\' 4"'Hr$  (1.626 m)   Wt 147 lb (66.7 kg)   SpO2 98%   BMI 25.23 kg/m      01/17/2022    8:40 AM 09/26/2021    2:11 PM 11/16/2020    9:10 AM  BP/Weight  Systolic BP 631 497 026  Diastolic BP 78 74 74  Wt. (Lbs) 147 147 145  BMI 25.23 kg/m2 25.23 kg/m2 24.89 kg/m2      Physical Exam Constitutional:      Appearance: He is well-developed.  Cardiovascular:     Rate and Rhythm: Normal rate.     Heart sounds: Normal heart sounds. No murmur heard. Pulmonary:     Effort: Pulmonary effort is normal.     Breath sounds: Normal breath sounds. No wheezing or rales.  Chest:     Chest wall: No tenderness.  Abdominal:     General: Bowel sounds are normal. There is no distension.     Palpations: Abdomen is soft. There is no mass.     Tenderness: There is no abdominal tenderness.  Musculoskeletal:        General: Normal range of motion.     Right lower leg: No edema.     Left lower leg: No edema.  Neurological:     Mental Status: He is alert and oriented to person, place, and time.  Psychiatric:        Mood and Affect: Mood normal.        Latest Ref Rng & Units 09/26/2021    3:09 PM 11/16/2020   10:42 AM 05/13/2020   11:30 AM  CMP  Glucose 70 - 99 mg/dL 129  140  131   BUN 8 - 27 mg/dL $Remove'26  19  17   'LHqPiBe$ Creatinine 0.76 - 1.27 mg/dL 1.78  1.33  1.04   Sodium 134 - 144 mmol/L 142  143  138   Potassium 3.5 - 5.2 mmol/L  4.9  4.9  4.7   Chloride 96 - 106 mmol/L 109  105  100   CO2 20 - 29 mmol/L $RemoveB'18  23  25   'LwAduXha$ Calcium 8.6 - 10.2 mg/dL 9.9  10.0  10.2   Total Protein 6.0 - 8.5 g/dL 7.7  7.0  7.8   Total Bilirubin 0.0 - 1.2 mg/dL 0.3  <0.2  0.5   Alkaline Phos 44 - 121 IU/L 95  81  83   AST 0 - 40 IU/L $Remov'29  21  25   'NbtZDi$ ALT 0 - 44 IU/L $Remov'26  26  24     'ufubiP$ Lipid Panel     Component Value Date/Time   CHOL 131 11/16/2020 1042   TRIG 65 11/16/2020 1042   HDL 44 11/16/2020 1042   CHOLHDL 3.0 11/16/2020 1042   CHOLHDL 2.8 02/29/2016 1018   VLDL 12 02/29/2016 1018   LDLCALC 73 11/16/2020 1042    CBC    Component Value Date/Time   WBC 6.1 07/18/2019 1153   WBC 7.6 05/05/2013 1116   RBC 4.73 07/18/2019 1153   RBC 4.82 05/05/2013 1116   HGB 12.6 (L) 07/18/2019 1153   HCT 37.6 07/18/2019 1153   PLT 368 07/18/2019 1153   MCV 80 07/18/2019 1153   MCH 26.6 07/18/2019 1153   MCH 28.0 05/05/2013 1116   MCHC 33.5 07/18/2019 1153   MCHC 35.7 05/05/2013 1116   RDW 17.0 (H) 07/18/2019  1153   LYMPHSABS 2.0 07/18/2019 1153   MONOABS 0.7 05/05/2013 1116   EOSABS 0.4 07/18/2019 1153   BASOSABS 0.1 07/18/2019 1153    Lab Results  Component Value Date   HGBA1C 8.2 (A) 01/17/2022    Assessment & Plan:  1. Type 2 diabetes mellitus with hypoglycemia without coma, with long-term current use of insulin (HCC) A1c of 8.2 up from 7.2 previously Advised to increase Lantus by 2 units from 25 units to 27 units We will exercise caution with adjustment of his medication as he is prone to hypoglycemia Counseled on Diabetic diet, my plate method, 834 minutes of moderate intensity exercise/week Blood sugar logs with fasting goals of 80-120 mg/dl, random of less than 180 and in the event of sugars less than 60 mg/dl or greater than 400 mg/dl encouraged to notify the clinic. Advised on the need for annual eye exams, annual foot exams, Pneumonia vaccine. - POCT glucose (manual entry) - POCT glycosylated hemoglobin (Hb A1C) -  Ambulatory referral to Ophthalmology - LP+Non-HDL Cholesterol  2. Type 2 diabetes mellitus with diabetic polyneuropathy, with long-term current use of insulin (HCC) Neuropathy is controlled on gabapentin - insulin glargine (LANTUS) 100 UNIT/ML Solostar Pen; Inject 27 Units into the skin daily.  Dispense: 30 mL; Refill: 6 - metFORMIN (GLUCOPHAGE) 1000 MG tablet; TAKE 1 AND 1/2 TABLETS ($RemoveBefo'1500MG'HiSaxJuufVE$ ) BY MOUTH IN THE MORNING AND 1 TABLET ($RemoveB'1000MG'pOWKaoDU$ ) IN THE EVENING.  Dispense: 290 tablet; Refill: 1 - gabapentin (NEURONTIN) 300 MG capsule; Take 1 capsule (300 mg total) by mouth daily.  Dispense: 90 capsule; Refill: 1  3. Hypertension associated with diabetes ([place]) Uncontrolled due to the fact that he is yet to take his antihypertensive Medication adherence has been emphasized  4. Hyperlipidemia associated with type 2 diabetes mellitus ([place]) Controlled We will check lipid panel again today Low-cholesterol diet  5. Need for shingles vaccine - Zoster Vaccine Adjuvanted [facility]) injection; Inject 0.5 mLs into the muscle once for 1 dose.  Dispense: 0.5 mL; Refill: 0  6. Localized, secondary osteoarthritis of the hand, unspecified laterality Unable to take diclofenac due to CKD - traMADol (ULTRAM) 50 MG tablet; Take 1 tablet (50 mg total) by mouth at bedtime as needed.  Dispense: 30 tablet; Refill: 1  7.  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    Dispense:  0.5 mL    Refill:  0    2nd dose in 2-6 months   insulin glargine (LANTUS) 100 UNIT/ML Solostar Pen    Sig: Inject 27 Units into the skin daily.    Dispense:  30 mL    Refill:  6   metFORMIN (GLUCOPHAGE) 1000 MG tablet    Sig: TAKE 1 AND 1/2 TABLETS ($RemoveBefo'1500MG'dpSpmsFPGqA$ ) BY MOUTH  IN THE MORNING AND 1 TABLET ($RemoveB'1000MG'DHWpKZjs$ ) IN THE EVENING.    Dispense:  290 tablet    Refill:  1   gabapentin (NEURONTIN) 300 MG capsule    Sig: Take 1 capsule (300 mg total) by mouth daily.    Dispense:  90 capsule    Refill:  1   traMADol (ULTRAM) 50 MG tablet    Sig: Take 1 tablet (50 mg total) by mouth at bedtime as needed.    Dispense:  30 tablet  Refill:  1    Discontinue Diclofenac    Follow-up: Return in about 3 months (around 04/19/2022).       Charlott Rakes, MD, FAAFP. [facility] [place], [place]   01/17/2022, 10:21 AM

## 2022-01-18 LAB — LP+NON-HDL CHOLESTEROL
Cholesterol, Total: 158 mg/dL (ref 100–199)
HDL: 57 mg/dL (ref >39)
LDL Chol Calc (NIH): 88 mg/dL (ref 0–99)
Total Non-HDL-Chol (LDL+VLDL): 101 mg/dL (ref 0–129)
Triglycerides: 67 mg/dL (ref 0–149)
VLDL Cholesterol Cal: 13 mg/dL (ref 5–40)

## 2022-01-19 ENCOUNTER — Other Ambulatory Visit: Payer: Self-pay

## 2022-01-19 ENCOUNTER — Other Ambulatory Visit: Payer: Self-pay | Admitting: Family Medicine

## 2022-01-19 DIAGNOSIS — E1142 Type 2 diabetes mellitus with diabetic polyneuropathy: Secondary | ICD-10-CM

## 2022-01-19 LAB — CMP14+EGFR
ALT: 17 IU/L (ref 0–44)
AST: 20 IU/L (ref 0–40)
Albumin/Globulin Ratio: 1.8 (ref 1.2–2.2)
Albumin: 4.9 g/dL — ABNORMAL HIGH (ref 3.8–4.8)
Alkaline Phosphatase: 82 IU/L (ref 44–121)
BUN/Creatinine Ratio: 18 (ref 10–24)
BUN: 44 mg/dL — ABNORMAL HIGH (ref 8–27)
Bilirubin Total: <0.2 mg/dL (ref 0.0–1.2)
CO2: 20 mmol/L (ref 20–29)
Calcium: 10.6 mg/dL — ABNORMAL HIGH (ref 8.6–10.2)
Chloride: 103 mmol/L (ref 96–106)
Creatinine, Ser: 2.48 mg/dL — ABNORMAL HIGH (ref 0.76–1.27)
Globulin, Total: 2.7 g/dL (ref 1.5–4.5)
Glucose: 89 mg/dL (ref 70–99)
Potassium: 5.2 mmol/L (ref 3.5–5.2)
Sodium: 139 mmol/L (ref 134–144)
Total Protein: 7.6 g/dL (ref 6.0–8.5)
eGFR: 27 mL/min/{1.73_m2} — ABNORMAL LOW (ref >59)

## 2022-01-19 LAB — SPECIMEN STATUS REPORT

## 2022-01-19 MED ORDER — INSULIN GLARGINE 100 UNIT/ML SOLOSTAR PEN
32.0000 [IU] | PEN_INJECTOR | Freq: Every day | SUBCUTANEOUS | 6 refills | Status: DC
  Filled 2022-01-19: qty 30, 93d supply, fill #0
  Filled 2022-05-09: qty 9, 28d supply, fill #0
  Filled 2022-06-14: qty 9, 28d supply, fill #1
  Filled 2022-07-24: qty 9, 28d supply, fill #2

## 2022-01-23 ENCOUNTER — Other Ambulatory Visit: Payer: Self-pay

## 2022-01-30 ENCOUNTER — Other Ambulatory Visit: Payer: Self-pay

## 2022-01-31 ENCOUNTER — Ambulatory Visit: Payer: Medicare Other | Attending: Family Medicine

## 2022-01-31 DIAGNOSIS — E1142 Type 2 diabetes mellitus with diabetic polyneuropathy: Secondary | ICD-10-CM

## 2022-01-31 DIAGNOSIS — Z794 Long term (current) use of insulin: Secondary | ICD-10-CM | POA: Diagnosis not present

## 2022-02-01 ENCOUNTER — Other Ambulatory Visit: Payer: Self-pay | Admitting: Family Medicine

## 2022-02-01 DIAGNOSIS — E1122 Type 2 diabetes mellitus with diabetic chronic kidney disease: Secondary | ICD-10-CM

## 2022-02-01 LAB — CMP14+EGFR
ALT: 23 IU/L (ref 0–44)
AST: 29 IU/L (ref 0–40)
Albumin/Globulin Ratio: 1.6 (ref 1.2–2.2)
Albumin: 4.5 g/dL (ref 3.8–4.8)
Alkaline Phosphatase: 92 IU/L (ref 44–121)
BUN/Creatinine Ratio: 14 (ref 10–24)
BUN: 29 mg/dL — ABNORMAL HIGH (ref 8–27)
Bilirubin Total: 0.4 mg/dL (ref 0.0–1.2)
CO2: 22 mmol/L (ref 20–29)
Calcium: 9.6 mg/dL (ref 8.6–10.2)
Chloride: 100 mmol/L (ref 96–106)
Creatinine, Ser: 2.11 mg/dL — ABNORMAL HIGH (ref 0.76–1.27)
Globulin, Total: 2.8 g/dL (ref 1.5–4.5)
Glucose: 213 mg/dL — ABNORMAL HIGH (ref 70–99)
Potassium: 4.4 mmol/L (ref 3.5–5.2)
Sodium: 136 mmol/L (ref 134–144)
Total Protein: 7.3 g/dL (ref 6.0–8.5)
eGFR: 33 mL/min/{1.73_m2} — ABNORMAL LOW (ref >59)

## 2022-02-15 ENCOUNTER — Other Ambulatory Visit: Payer: Self-pay | Admitting: Family Medicine

## 2022-02-15 ENCOUNTER — Other Ambulatory Visit: Payer: Self-pay

## 2022-02-15 DIAGNOSIS — M19049 Primary osteoarthritis, unspecified hand: Secondary | ICD-10-CM

## 2022-02-15 NOTE — Telephone Encounter (Signed)
Requested medication (s) are due for refill today: no  Requested medication (s) are on the active medication list:no  Last refill:  09/27/21, discontinued  Future visit scheduled: YES  Notes to clinic:  Unable to refill per protocol, Rx expired. Medication was discontinued 09/27/21 by PCP.      Requested Prescriptions  Pending Prescriptions Disp Refills   diclofenac (VOLTAREN) 75 MG EC tablet 60 tablet 2    Sig: TAKE 1 TABLET (75 MG TOTAL) BY MOUTH 2 (TWO) TIMES DAILY.     Analgesics:  NSAIDS Failed - 02/15/2022 12:44 PM      Failed - Manual Review: Labs are only required if the patient has taken medication for more than 8 weeks.      Failed - Cr in normal range and within 360 days    Creat  Date Value Ref Range Status  09/05/2016 0.90 0.70 - 1.25 mg/dL Final    Comment:      For patients > or = 73 years of age: The upper reference limit for Creatinine is approximately 13% higher for people identified as African-American.      Creatinine, Ser  Date Value Ref Range Status  01/31/2022 2.11 (H) 0.76 - 1.27 mg/dL Final   Creatinine, Urine  Date Value Ref Range Status  09/05/2016 378 (H) 20 - 370 mg/dL Final    Comment:    Result repeated and verified. Result confirmed by automatic dilution.          Failed - HGB in normal range and within 360 days    Hemoglobin  Date Value Ref Range Status  07/18/2019 12.6 (L) 13.0 - 17.7 g/dL Final         Failed - PLT in normal range and within 360 days    Platelets  Date Value Ref Range Status  07/18/2019 368 150 - 450 x10E3/uL Final         Failed - HCT in normal range and within 360 days    Hematocrit  Date Value Ref Range Status  07/18/2019 37.6 37.5 - 51.0 % Final         Passed - eGFR is 30 or above and within 360 days    GFR, Est African American  Date Value Ref Range Status  09/05/2016 >89 >=60 mL/min Final   GFR calc Af Amer  Date Value Ref Range Status  05/13/2020 83 >59 mL/min/1.73 Final    Comment:     **In accordance with recommendations from the NKF-ASN Task force,**   [facility] is in the process of updating its eGFR calculation to the   2021 CKD-EPI creatinine equation that estimates kidney function   without a race variable.    GFR, Est Non African American  Date Value Ref Range Status  09/05/2016 88 >=60 mL/min Final   GFR calc non Af Amer  Date Value Ref Range Status  05/13/2020 72 >59 mL/min/1.73 Final   eGFR  Date Value Ref Range Status  01/31/2022 33 (L) >59 mL/min/1.73 Final         Passed - Patient is not pregnant      Passed - Valid encounter within last 12 months    Recent Outpatient Visits           4 weeks ago Type 2 diabetes mellitus with hypoglycemia without coma, with long-term current use of insulin ([place])   [place], Charlane Ferretti, MD   4 months ago Type 2 diabetes mellitus with hypoglycemia without coma, with  long-term current use of insulin ([place])   [place], [place], MD   1 year ago Type 2 diabetes mellitus with diabetic polyneuropathy, with long-term current use of insulin ([place])   [place], [place], MD   1 year ago Type 2 diabetes mellitus with diabetic polyneuropathy, with long-term current use of insulin ([place])   [place], Charlane Ferretti, MD   1 year ago Type 2 diabetes mellitus with diabetic polyneuropathy, with long-term current use of insulin [facility])   [place], RPH-CPP       Future Appointments             In 2 months Charlott Rakes, MD [place]

## 2022-03-14 ENCOUNTER — Other Ambulatory Visit: Payer: Self-pay

## 2022-03-24 ENCOUNTER — Other Ambulatory Visit: Payer: Self-pay

## 2022-04-09 ENCOUNTER — Other Ambulatory Visit: Payer: Self-pay | Admitting: Family Medicine

## 2022-04-09 DIAGNOSIS — K219 Gastro-esophageal reflux disease without esophagitis: Secondary | ICD-10-CM

## 2022-04-10 ENCOUNTER — Other Ambulatory Visit: Payer: Self-pay

## 2022-04-10 DIAGNOSIS — H40023 Open angle with borderline findings, high risk, bilateral: Secondary | ICD-10-CM | POA: Diagnosis not present

## 2022-04-10 DIAGNOSIS — H1045 Other chronic allergic conjunctivitis: Secondary | ICD-10-CM | POA: Diagnosis not present

## 2022-04-10 DIAGNOSIS — H25813 Combined forms of age-related cataract, bilateral: Secondary | ICD-10-CM | POA: Diagnosis not present

## 2022-04-10 DIAGNOSIS — E119 Type 2 diabetes mellitus without complications: Secondary | ICD-10-CM | POA: Diagnosis not present

## 2022-04-10 LAB — HM DIABETES EYE EXAM

## 2022-04-10 MED ORDER — PANTOPRAZOLE SODIUM 40 MG PO TBEC
40.0000 mg | DELAYED_RELEASE_TABLET | Freq: Every day | ORAL | 2 refills | Status: DC
  Filled 2022-04-10: qty 30, 30d supply, fill #0
  Filled 2022-08-12: qty 30, 30d supply, fill #1
  Filled 2022-09-08: qty 30, 30d supply, fill #2

## 2022-04-10 NOTE — Telephone Encounter (Signed)
Requested Prescriptions  Pending Prescriptions Disp Refills  . pantoprazole (PROTONIX) 40 MG tablet 30 tablet 2    Sig: TAKE 1 TABLET BY MOUTH DAILY.     Gastroenterology: Proton Pump Inhibitors Passed - 04/09/2022 12:35 AM      Passed - Valid encounter within last 12 months    Recent Outpatient Visits          2 months ago Type 2 diabetes mellitus with hypoglycemia without coma, with long-term current use of insulin ([place])   [place], [place], MD   6 months ago Type 2 diabetes mellitus with hypoglycemia without coma, with long-term current use of insulin ([place])   [place], [place], MD   1 year ago Type 2 diabetes mellitus with diabetic polyneuropathy, with long-term current use of insulin ([place])   [place], [place], MD   1 year ago Type 2 diabetes mellitus with diabetic polyneuropathy, with long-term current use of insulin ([place])   [place], Charlane Ferretti, MD   1 year ago Type 2 diabetes mellitus with diabetic polyneuropathy, with long-term current use of insulin [facility])   [place], RPH-CPP      Future Appointments            In 2 weeks Charlott Rakes, MD [place]

## 2022-04-22 ENCOUNTER — Other Ambulatory Visit: Payer: Self-pay | Admitting: Family Medicine

## 2022-04-22 DIAGNOSIS — E1159 Type 2 diabetes mellitus with other circulatory complications: Secondary | ICD-10-CM

## 2022-04-24 ENCOUNTER — Other Ambulatory Visit: Payer: Self-pay

## 2022-04-24 MED ORDER — AMLODIPINE BESYLATE 10 MG PO TABS
10.0000 mg | ORAL_TABLET | Freq: Every day | ORAL | 0 refills | Status: DC
  Filled 2022-04-24: qty 90, 90d supply, fill #0

## 2022-04-24 NOTE — Telephone Encounter (Signed)
Requested Prescriptions  Pending Prescriptions Disp Refills  . amLODipine (NORVASC) 10 MG tablet 90 tablet 1    Sig: Take 1 tablet (10 mg total) by mouth daily.     Cardiovascular: Calcium Channel Blockers 2 Failed - 04/22/2022 11:46 PM      Failed - Last BP in normal range    BP Readings from Last 1 Encounters:  01/17/22 (!) 155/78         Passed - Last Heart Rate in normal range    Pulse Readings from Last 1 Encounters:  01/17/22 96         Passed - Valid encounter within last 6 months    Recent Outpatient Visits          3 months ago Type 2 diabetes mellitus with hypoglycemia without coma, with long-term current use of insulin ([place])   [place], [place], MD   7 months ago Type 2 diabetes mellitus with hypoglycemia without coma, with long-term current use of insulin ([place])   [place], [place], MD   1 year ago Type 2 diabetes mellitus with diabetic polyneuropathy, with long-term current use of insulin ([place])   [place], [place], MD   1 year ago Type 2 diabetes mellitus with diabetic polyneuropathy, with long-term current use of insulin ([place])   [place], [place], MD   1 year ago Type 2 diabetes mellitus with diabetic polyneuropathy, with long-term current use of insulin [facility])   [place], Jarome [place], RPH-CPP      Future Appointments            Tomorrow Charlott Rakes, MD [place]

## 2022-04-25 ENCOUNTER — Other Ambulatory Visit: Payer: Self-pay

## 2022-04-25 ENCOUNTER — Ambulatory Visit: Payer: Medicare Other | Attending: Family Medicine | Admitting: Family Medicine

## 2022-04-25 ENCOUNTER — Encounter: Payer: Self-pay | Admitting: Family Medicine

## 2022-04-25 VITALS — BP 160/82 | HR 87 | Ht 64.0 in | Wt 151.8 lb

## 2022-04-25 DIAGNOSIS — E785 Hyperlipidemia, unspecified: Secondary | ICD-10-CM

## 2022-04-25 DIAGNOSIS — M545 Low back pain, unspecified: Secondary | ICD-10-CM | POA: Diagnosis not present

## 2022-04-25 DIAGNOSIS — Z794 Long term (current) use of insulin: Secondary | ICD-10-CM

## 2022-04-25 DIAGNOSIS — E1122 Type 2 diabetes mellitus with diabetic chronic kidney disease: Secondary | ICD-10-CM

## 2022-04-25 DIAGNOSIS — G8929 Other chronic pain: Secondary | ICD-10-CM | POA: Diagnosis not present

## 2022-04-25 DIAGNOSIS — I129 Hypertensive chronic kidney disease with stage 1 through stage 4 chronic kidney disease, or unspecified chronic kidney disease: Secondary | ICD-10-CM | POA: Diagnosis not present

## 2022-04-25 DIAGNOSIS — N1832 Chronic kidney disease, stage 3b: Secondary | ICD-10-CM

## 2022-04-25 DIAGNOSIS — E1169 Type 2 diabetes mellitus with other specified complication: Secondary | ICD-10-CM | POA: Diagnosis not present

## 2022-04-25 DIAGNOSIS — E1142 Type 2 diabetes mellitus with diabetic polyneuropathy: Secondary | ICD-10-CM

## 2022-04-25 LAB — POCT GLYCOSYLATED HEMOGLOBIN (HGB A1C): HbA1c, POC (controlled diabetic range): 10.6 % — AB (ref 0.0–7.0)

## 2022-04-25 LAB — GLUCOSE, POCT (MANUAL RESULT ENTRY): POC Glucose: 221 mg/dl — AB (ref 70–99)

## 2022-04-25 MED ORDER — GLIPIZIDE 5 MG PO TABS
5.0000 mg | ORAL_TABLET | Freq: Two times a day (BID) | ORAL | 3 refills | Status: DC
  Filled 2022-04-25: qty 60, 30d supply, fill #0
  Filled 2022-05-29: qty 60, 30d supply, fill #1
  Filled 2022-07-02: qty 60, 30d supply, fill #2
  Filled 2022-08-08: qty 60, 30d supply, fill #3

## 2022-04-25 MED ORDER — ATORVASTATIN CALCIUM 40 MG PO TABS
ORAL_TABLET | Freq: Every day | ORAL | 1 refills | Status: DC
  Filled 2022-04-25: qty 90, fill #0
  Filled 2022-06-17: qty 90, 90d supply, fill #0
  Filled 2022-09-08: qty 90, 90d supply, fill #1

## 2022-04-25 MED ORDER — VALSARTAN-HYDROCHLOROTHIAZIDE 320-25 MG PO TABS
1.0000 | ORAL_TABLET | Freq: Every day | ORAL | 1 refills | Status: DC
  Filled 2022-04-25: qty 90, 90d supply, fill #0
  Filled 2022-07-20: qty 90, 90d supply, fill #1

## 2022-04-25 NOTE — Patient Instructions (Signed)

## 2022-04-25 NOTE — Progress Notes (Signed)
Subjective:  Patient ID: Jerry Serrano, male    DOB: [birth year]  Age: 71 y.o. MRN: [id number]  CC: Medication Refill and Diabetes   HPI Jerry Serrano is a 71 y.o. year old male with a history of hypertension, hyperlipidemia, type 2 diabetes mellitus (A1c 10.6) who presents today for a  follow-up visit.   Interval History: A1c is 10.6 up from 8.2 previously. Blood sugars prior to breakfast run around 190. Metformin was discontinued after his last set of labs due to abnormal renal function and Lantus dose was increased from 25 to 32 units.  He endorses adherence with Lantus. Adherence with a diabetic diet cannot be ascertained.  BP at home was 125/75 and he is adherent with his antihypertensive.  He sometimes has low back pain intermittent and he is unsure of the trigger and pain is described as moderate.  Pain does not radiate down his lower extremities, he denies history of heavy lifting and he has no numbness in his legs. Past Medical History:  Diagnosis Date   Allergy    Arthritis    Diabetes mellitus    Hyperlipidemia    Hypertension     Past Surgical History:  Procedure Laterality Date   HERNIA REPAIR      Family History  Problem Relation Age of Onset   Colon cancer Neg Hx    Esophageal cancer Neg Hx    Rectal cancer Neg Hx    Stomach cancer Neg Hx     Social History   Socioeconomic History   Marital status: Married    Spouse name: Not on file   Number of children: Not on file   Years of education: Not on file   Highest education level: Not on file  Occupational History   Not on file  Tobacco Use   Smoking status: Former    Types: Cigarettes   Smokeless tobacco: Never   Tobacco comments:    over 30 years ago  Vaping Use   Vaping Use: Never used  Substance and Sexual Activity   Alcohol use: No   Drug use: No   Sexual activity: Not on file  Other Topics Concern   Not on file  Social History Narrative   Not on file   Social Determinants of  Health   Financial Resource Strain: Low Risk  (03/25/2021)   Overall Financial Resource Strain (CARDIA)    Difficulty of Paying Living Expenses: Not hard at all  Food Insecurity: No Food Insecurity (10/03/2021)   Hunger Vital Sign    Worried About Running Out of Food in the Last Year: Never true    Ran Out of Food in the Last Year: Never true  Transportation Needs: No Transportation Needs (10/03/2021)   PRAPARE - Hydrologist (Medical): No    Lack of Transportation (Non-Medical): No  Physical Activity: Inactive (03/25/2021)   Exercise Vital Sign    Days of Exercise per Week: 0 days    Minutes of Exercise per Session: 0 min  Stress: No Stress Concern Present (03/25/2021)   [place]    Feeling of Stress : Not at all  Social Connections: Socially Isolated (03/25/2021)   Social Connection and Isolation Panel [NHANES]    Frequency of Communication with Friends and Family: More than three times a week    Frequency of Social Gatherings with Friends and Family: More than three times a week    Attends Religious  Services: Never    Active Member of Clubs or Organizations: No    Attends Archivist Meetings: Never    Marital Status: Widowed    No Known Allergies  Outpatient Medications Prior to Visit  Medication Sig Dispense Refill   amLODipine (NORVASC) 10 MG tablet Take 1 tablet (10 mg total) by mouth daily. 90 tablet 0   aspirin EC 81 MG tablet Take 1 tablet (81 mg total) by mouth daily. 30 tablet 3   betamethasone dipropionate 0.05 % cream APPLY EXTERNALLY TO THE AFFECTED AREA TWICE DAILY 30 g 0   Blood Glucose Monitoring Suppl (ACCU-CHEK AVIVA PLUS) w/Device KIT USE AS DIRECTED 4 TIMES DAILY AFTER MEALS AND AT BEDTIME. 1 kit 0   gabapentin (NEURONTIN) 300 MG capsule Take 1 capsule (300 mg total) by mouth daily. 90 capsule 1   glucose blood (ACCU-CHEK AVIVA PLUS) test strip USE 1 STRIP TO  CHECK GLUCOSE THREE TIMES DAILY FOR BLOOD SUGAR --MUST  HAVE  OFFICE  VISIT  FOR  REFILLS 300 each 2   insulin glargine (LANTUS) 100 UNIT/ML Solostar Pen Inject 32 Units into the skin daily. 30 mL 6   ketoconazole (NIZORAL) 2 % cream Apply 1 application topically daily. 15 g 0   Lancet Devices (ACCU-CHEK SOFTCLIX) lancets Use as instructed 1 each 12   MICROLET LANCETS MISC Test 3 times per day 100 each 12   olopatadine (PATANOL) 0.1 % ophthalmic solution Place 1 drop into both eyes 2 (two) times daily.     pantoprazole (PROTONIX) 40 MG tablet TAKE 1 TABLET BY MOUTH DAILY. 90 tablet 1   pantoprazole (PROTONIX) 40 MG tablet Take 1 tablet (40 mg total) by mouth daily. 30 tablet 2   sildenafil (VIAGRA) 50 MG tablet Take 1 tablet (50 mg total) by mouth daily as needed for erectile dysfunction. 10 tablet 1   traMADol (ULTRAM) 50 MG tablet Take 1 tablet (50 mg total) by mouth at bedtime as needed. 30 tablet 1   atorvastatin (LIPITOR) 40 MG tablet TAKE 1 TABLET (40 MG TOTAL) BY MOUTH DAILY. 90 tablet 1   losartan-hydrochlorothiazide (HYZAAR) 100-25 MG tablet Take 1 tablet by mouth daily. 90 tablet 1   Facility-Administered Medications Prior to Visit  Medication Dose Route Frequency Provider Last Rate Last Admin   0.9 %  sodium chloride infusion  500 mL Intravenous Continuous Danis, Estill Cotta III, MD         ROS Review of Systems  Constitutional:  Negative for activity change and appetite change.  HENT:  Negative for sinus pressure and sore throat.   Respiratory:  Negative for chest tightness, shortness of breath and wheezing.   Cardiovascular:  Negative for chest pain and palpitations.  Gastrointestinal:  Negative for abdominal distention, abdominal pain and constipation.  Genitourinary: Negative.   Musculoskeletal:        See HPI  Psychiatric/Behavioral:  Negative for behavioral problems and dysphoric mood.     Objective:  BP (!) 160/82   Pulse 87   Ht 5' 4" (1.626 m)   Wt 151 lb 12.8 oz  (68.9 kg)   SpO2 96%   BMI 26.06 kg/m      04/25/2022    9:05 AM 04/25/2022    8:42 AM 01/17/2022    8:40 AM  BP/Weight  Systolic BP 500 938 182  Diastolic BP 82 81 78  Wt. (Lbs)  151.8 147  BMI  26.06 kg/m2 25.23 kg/m2      Physical Exam Constitutional:  Appearance: He is well-developed.  Cardiovascular:     Rate and Rhythm: Normal rate.     Heart sounds: Normal heart sounds. No murmur heard. Pulmonary:     Effort: Pulmonary effort is normal.     Breath sounds: Normal breath sounds. No wheezing or rales.  Chest:     Chest wall: No tenderness.  Abdominal:     General: Bowel sounds are normal. There is no distension.     Palpations: Abdomen is soft. There is no mass.     Tenderness: There is no abdominal tenderness.  Musculoskeletal:        General: Normal range of motion.     Right lower leg: No edema.     Left lower leg: No edema.  Neurological:     Mental Status: He is alert and oriented to person, place, and time.  Psychiatric:        Mood and Affect: Mood normal.        Latest Ref Rng & Units 01/31/2022    8:49 AM 01/17/2022    9:29 AM 09/26/2021    3:09 PM  CMP  Glucose 70 - 99 mg/dL 213  89  129   BUN 8 - 27 mg/dL 29  44  26   Creatinine 0.76 - 1.27 mg/dL 2.11  2.48  1.78   Sodium 134 - 144 mmol/L 136  139  142   Potassium 3.5 - 5.2 mmol/L 4.4  5.2  4.9   Chloride 96 - 106 mmol/L 100  103  109   CO2 20 - 29 mmol/L _0 Calcium 8.6 - 10.2 mg/dL 9.6  10.6  9.9   Total Protein 6.0 - 8.5 g/dL 7.3  7.6  7.7   Total Bilirubin 0.0 - 1.2 mg/dL 0.4  <0.2  0.3   Alkaline Phos 44 - 121 IU/L 92  82  95   AST 0 - 40 IU/L _1 ALT 0 - 44 IU/L _2 Lipid Panel     Component Value Date/Time   CHOL 158 01/17/2022 0929   TRIG 67 01/17/2022 0929   HDL 57 01/17/2022 0929   CHOLHDL 3.0 11/16/2020 1042   CHOLHDL 2.8 02/29/2016 1018   VLDL 12 02/29/2016 1018   LDLCALC 88 01/17/2022 0929    CBC    Component Value Date/Time   WBC  6.1 07/18/2019 1153   WBC 7.6 05/05/2013 1116   RBC 4.73 07/18/2019 1153   RBC 4.82 05/05/2013 1116   HGB 12.6 (L) 07/18/2019 1153   HCT 37.6 07/18/2019 1153   PLT 368 07/18/2019 1153   MCV 80 07/18/2019 1153   MCH 26.6 07/18/2019 1153   MCH 28.0 05/05/2013 1116   MCHC 33.5 07/18/2019 1153   MCHC 35.7 05/05/2013 1116   RDW 17.0 (H) 07/18/2019 1153   LYMPHSABS 2.0 07/18/2019 1153   MONOABS 0.7 05/05/2013 1116   EOSABS 0.4 07/18/2019 1153   BASOSABS 0.1 07/18/2019 1153    Lab Results  Component Value Date   HGBA1C 10.6 (A) 04/25/2022    Assessment & Plan:  1. Type 2 diabetes mellitus with diabetic polyneuropathy, with long-term current use of insulin (HCC) Uncontrolled with A1c of 10.6; goal is less than 7.0 Metformin was discontinued due to renal function Glipizide added to regimen Advised to increase Lantus by 2 units if blood sugars are above goal We will review blood sugar at next visit -  POCT glucose (manual entry) - POCT glycosylated hemoglobin (Hb A1C) - glipiZIDE (GLUCOTROL) 5 MG tablet; Take 1 tablet (5 mg total) by mouth 2 (two) times daily before a meal.  Dispense: 60 tablet; Refill: 3 - [id number]+EGFR  2. 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 Counseled on evidence of improvement in pain control with regards to yoga, water aerobics, massage, home physical therapy, exercise as tolerated.   3. Hyperlipidemia associated with type 2 diabetes mellitus (HCC) Controlled Low-cholesterol diet - atorvastatin (LIPITOR) 40 MG tablet; TAKE 1 TABLET (40 MG TOTAL) BY MOUTH DAILY.  Dispense: 90 tablet; Refill: 1  4. 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  Dispense: 90 tablet; Refill: 1  Health Care Maintenance: He received Flu and Shingles vaccines at [facility]. He will obtain records for [place] Meds ordered this encounter  Medications   glipiZIDE (GLUCOTROL) 5 MG tablet    Sig: Take 1 tablet (5 mg total) by mouth 2 (two) times daily before a meal.    Dispense:  60 tablet    Refill:  3   valsartan-hydrochlorothiazide (DIOVAN-HCT) 320-25 MG tablet    Sig: Take 1 tablet by mouth daily.    Dispense:  90 tablet    Refill:  1    Discontinue losartan/HCTZ   atorvastatin (LIPITOR) 40 MG tablet    Sig: TAKE 1 TABLET (40 MG TOTAL) BY MOUTH DAILY.    Dispense:  90 tablet    Refill:  1    Follow-up: Return in about 1 month (around 05/26/2022) for Diabetes follow-up.       Charlott Rakes, MD, FAAFP. [facility] and [facility] [place], [place]   04/25/2022, 11:03 AM

## 2022-04-26 LAB — CMP14+EGFR
ALT: 25 IU/L (ref 0–44)
AST: 21 IU/L (ref 0–40)
Albumin/Globulin Ratio: 1.6 (ref 1.2–2.2)
Albumin: 4.6 g/dL (ref 3.8–4.8)
Alkaline Phosphatase: 141 IU/L — ABNORMAL HIGH (ref 44–121)
BUN/Creatinine Ratio: 14 (ref 10–24)
BUN: 31 mg/dL — ABNORMAL HIGH (ref 8–27)
Bilirubin Total: 0.3 mg/dL (ref 0.0–1.2)
CO2: 20 mmol/L (ref 20–29)
Calcium: 10.3 mg/dL — ABNORMAL HIGH (ref 8.6–10.2)
Chloride: 101 mmol/L (ref 96–106)
Creatinine, Ser: 2.26 mg/dL — ABNORMAL HIGH (ref 0.76–1.27)
Globulin, Total: 2.8 g/dL (ref 1.5–4.5)
Glucose: 215 mg/dL — ABNORMAL HIGH (ref 70–99)
Potassium: 4.5 mmol/L (ref 3.5–5.2)
Sodium: 138 mmol/L (ref 134–144)
Total Protein: 7.4 g/dL (ref 6.0–8.5)
eGFR: 30 mL/min/{1.73_m2} — ABNORMAL LOW (ref >59)

## 2022-04-27 ENCOUNTER — Other Ambulatory Visit: Payer: Self-pay

## 2022-05-02 ENCOUNTER — Other Ambulatory Visit: Payer: Self-pay

## 2022-05-02 ENCOUNTER — Other Ambulatory Visit: Payer: Self-pay | Admitting: Family Medicine

## 2022-05-02 DIAGNOSIS — E1159 Type 2 diabetes mellitus with other circulatory complications: Secondary | ICD-10-CM

## 2022-05-02 DIAGNOSIS — I152 Hypertension secondary to endocrine disorders: Secondary | ICD-10-CM

## 2022-05-08 ENCOUNTER — Other Ambulatory Visit: Payer: Self-pay

## 2022-05-09 ENCOUNTER — Other Ambulatory Visit: Payer: Self-pay | Admitting: Nephrology

## 2022-05-09 ENCOUNTER — Other Ambulatory Visit: Payer: Self-pay

## 2022-05-09 DIAGNOSIS — I159 Secondary hypertension, unspecified: Secondary | ICD-10-CM

## 2022-05-09 DIAGNOSIS — E1122 Type 2 diabetes mellitus with diabetic chronic kidney disease: Secondary | ICD-10-CM | POA: Diagnosis not present

## 2022-05-09 DIAGNOSIS — N1832 Chronic kidney disease, stage 3b: Secondary | ICD-10-CM | POA: Diagnosis not present

## 2022-05-09 DIAGNOSIS — I129 Hypertensive chronic kidney disease with stage 1 through stage 4 chronic kidney disease, or unspecified chronic kidney disease: Secondary | ICD-10-CM | POA: Diagnosis not present

## 2022-05-12 ENCOUNTER — Ambulatory Visit: Payer: Medicare Other | Attending: Family Medicine

## 2022-05-12 DIAGNOSIS — Z0000 Encounter for general adult medical examination without abnormal findings: Secondary | ICD-10-CM

## 2022-05-12 NOTE — Patient Instructions (Signed)
Jerry Serrano , Thank you for taking time to come for your Medicare Wellness Visit. I appreciate your ongoing commitment to your health goals. Please review the following plan we discussed and let me know if I can assist you in the future.   These are the goals we discussed:  Goals      Blood Pressure < 140/90     HEMOGLOBIN A1C < 7.0        This is a list of the screening recommended for you and due dates:  Health Maintenance  Topic Date Due   Zoster (Shingles) Vaccine (1 of 2) Never done   COVID-19 Vaccine (3 - Pfizer series) 12/02/2019   Medicare Annual Wellness Visit  03/25/2022   Yearly kidney health urinalysis for diabetes  09/27/2022   Complete foot exam   09/27/2022   Hemoglobin A1C  10/25/2022   Eye exam for diabetics  04/11/2023   Yearly kidney function blood test for diabetes  04/26/2023   Colon Cancer Screening  11/02/2026   Tetanus Vaccine  02/22/2028   Pneumonia Vaccine  Completed   Flu Shot  Completed   Hepatitis C Screening: USPSTF Recommendation to screen - Ages 18-79 yo.  Completed   HPV Vaccine  Aged Out  Health Maintenance After Age 61 After age 63, you are at a higher risk for certain long-term diseases and infections as well as injuries from falls. Falls are a major cause of broken bones and head injuries in people who are older than age 41. Getting regular preventive care can help to keep you healthy and well. Preventive care includes getting regular testing and making lifestyle changes as recommended by your health care provider. Talk with your health care provider about: Which screenings and tests you should have. A screening is a test that checks for a disease when you have no symptoms. A diet and exercise plan that is right for you. What should I know about screenings and tests to prevent falls? Screening and testing are the best ways to find a health problem early. Early diagnosis and treatment give you the best chance of managing medical conditions that  are common after age 51. Certain conditions and lifestyle choices may make you more likely to have a fall. Your health care provider may recommend: Regular vision checks. Poor vision and conditions such as cataracts can make you more likely to have a fall. If you wear glasses, make sure to get your prescription updated if your vision changes. Medicine review. Work with your health care provider to regularly review all of the medicines you are taking, including over-the-counter medicines. Ask your health care provider about any side effects that may make you more likely to have a fall. Tell your health care provider if any medicines that you take make you feel dizzy or sleepy. Strength and balance checks. Your health care provider may recommend certain tests to check your strength and balance while standing, walking, or changing positions. Foot health exam. Foot pain and numbness, as well as not wearing proper footwear, can make you more likely to have a fall. Screenings, including: Osteoporosis screening. Osteoporosis is a condition that causes the bones to get weaker and break more easily. Blood pressure screening. Blood pressure changes and medicines to control blood pressure can make you feel dizzy. Depression screening. You may be more likely to have a fall if you have a fear of falling, feel depressed, or feel unable to do activities that you used to do. Alcohol use screening.  Using too much alcohol can affect your balance and may make you more likely to have a fall. Follow these instructions at home: Lifestyle Do not drink alcohol if: Your health care provider tells you not to drink. If you drink alcohol: Limit how much you have to: 0-1 drink a day for women. 0-2 drinks a day for men. Know how much alcohol is in your drink. In the [place] one drink equals one 12 oz bottle of beer (355 mL), one 5 oz glass of wine (148 mL), or one 1 oz glass of hard liquor (44 mL). Do not use any products that  contain nicotine or tobacco. These products include cigarettes, chewing tobacco, and vaping devices, such as e-cigarettes. If you need help quitting, ask your health care provider. Activity  Follow a regular exercise program to stay fit. This will help you maintain your balance. Ask your health care provider what types of exercise are appropriate for you. If you need a cane or walker, use it as recommended by your health care provider. Wear supportive shoes that have nonskid soles. Safety  Remove any tripping hazards, such as rugs, cords, and clutter. Install safety equipment such as grab bars in bathrooms and safety rails on stairs. Keep rooms and walkways well-lit. General instructions Talk with your health care provider about your risks for falling. Tell your health care provider if: You fall. Be sure to tell your health care provider about all falls, even ones that seem minor. You feel dizzy, tiredness (fatigue), or off-balance. Take over-the-counter and prescription medicines only as told by your health care provider. These include supplements. Eat a healthy diet and maintain a healthy weight. A healthy diet includes low-fat dairy products, low-fat (lean) meats, and fiber from whole grains, beans, and lots of fruits and vegetables. Stay current with your vaccines. Schedule regular health, dental, and eye exams. Summary Having a healthy lifestyle and getting preventive care can help to protect your health and wellness after age 47. Screening and testing are the best way to find a health problem early and help you avoid having a fall. Early diagnosis and treatment give you the best chance for managing medical conditions that are more common for people who are older than age 59. Falls are a major cause of broken bones and head injuries in people who are older than age 4. Take precautions to prevent a fall at home. Work with your health care provider to learn what changes you can make to  improve your health and wellness and to prevent falls. This information is not intended to replace advice given to you by your health care provider. Make sure you discuss any questions you have with your health care provider. Document Revised: 11/15/2020 Document Reviewed: 11/15/2020 Elsevier Patient Education  [place].

## 2022-05-12 NOTE — Progress Notes (Signed)
Subjective:   Jerry Serrano is a 71 y.o. male who presents for Medicare Annual/Subsequent preventive examination.  Review of Systems     I connected with Joahan Swatzell on 05/12/2022 at 11:35 am by telephone and verified that I am speaking with the correct person using two identifiers. I discussed the limitations, risks, security and privacy concerns of performing an evaluation and management service by telephone and the availability of in person appointments. I also discussed with the patient that there may be a patient responsible charge related to this service. The patient expressed understanding and agreed to proceed.   Patient location: Home My Location: [place] on the telephone call: Myself and Patinet     Cardiac Risk Factors include: none     Objective:    There were no vitals filed for this visit. There is no height or weight on file to calculate BMI.     05/12/2022   11:39 AM 03/25/2021   10:13 AM 08/11/2020   11:03 AM 02/06/2019    1:22 PM 01/07/2019    3:25 PM 06/04/2018    8:52 AM 04/10/2017   10:38 AM  Advanced Directives  Does Patient Have a Medical Advance Directive? _0  No No  Would patient like information on creating a medical advance directive? Yes (ED - Information included in AVS) No - Patient declined No - Patient declined Yes (ED - Information included in AVS) No - Patient declined No - Patient declined     Current Medications (verified) Outpatient Encounter Medications as of 05/12/2022  Medication Sig   amLODipine (NORVASC) 10 MG tablet Take 1 tablet (10 mg total) by mouth daily.   aspirin EC 81 MG tablet Take 1 tablet (81 mg total) by mouth daily.   atorvastatin (LIPITOR) 40 MG tablet TAKE 1 TABLET (40 MG TOTAL) BY MOUTH DAILY.   betamethasone dipropionate 0.05 % cream APPLY EXTERNALLY TO THE AFFECTED AREA TWICE DAILY   Blood Glucose Monitoring Suppl (ACCU-CHEK AVIVA PLUS) w/Device KIT USE AS DIRECTED 4 TIMES  DAILY AFTER MEALS AND AT BEDTIME.   gabapentin (NEURONTIN) 300 MG capsule Take 1 capsule (300 mg total) by mouth daily.   glipiZIDE (GLUCOTROL) 5 MG tablet Take 1 tablet (5 mg total) by mouth 2 (two) times daily before a meal.   glucose blood (ACCU-CHEK AVIVA PLUS) test strip USE 1 STRIP TO CHECK GLUCOSE THREE TIMES DAILY FOR BLOOD SUGAR --MUST  HAVE  OFFICE  VISIT  FOR  REFILLS   insulin glargine (LANTUS) 100 UNIT/ML Solostar Pen Inject 32 Units into the skin daily.   ketoconazole (NIZORAL) 2 % cream Apply 1 application topically daily.   Lancet Devices (ACCU-CHEK SOFTCLIX) lancets Use as instructed   MICROLET LANCETS MISC Test 3 times per day   olopatadine (PATANOL) 0.1 % ophthalmic solution Place 1 drop into both eyes 2 (two) times daily.   pantoprazole (PROTONIX) 40 MG tablet TAKE 1 TABLET BY MOUTH DAILY.   pantoprazole (PROTONIX) 40 MG tablet Take 1 tablet (40 mg total) by mouth daily.   sildenafil (VIAGRA) 50 MG tablet Take 1 tablet (50 mg total) by mouth daily as needed for erectile dysfunction.   traMADol (ULTRAM) 50 MG tablet Take 1 tablet (50 mg total) by mouth at bedtime as needed.   valsartan-hydrochlorothiazide (DIOVAN-HCT) 320-25 MG tablet Take 1 tablet by mouth daily.   Facility-Administered Encounter Medications as of 05/12/2022  Medication   0.9 %  sodium chloride infusion  Allergies (verified) Patient has no known allergies.   History: Past Medical History:  Diagnosis Date   Allergy    Arthritis    Diabetes mellitus    Hyperlipidemia    Hypertension    Past Surgical History:  Procedure Laterality Date   HERNIA REPAIR     Family History  Problem Relation Age of Onset   Colon cancer Neg Hx    Esophageal cancer Neg Hx    Rectal cancer Neg Hx    Stomach cancer Neg Hx    Social History   Socioeconomic History   Marital status: Married    Spouse name: Not on file   Number of children: Not on file   Years of education: Not on file   Highest education  level: Not on file  Occupational History   Not on file  Tobacco Use   Smoking status: Former    Types: Cigarettes   Smokeless tobacco: Never   Tobacco comments:    over 30 years ago  Vaping Use   Vaping Use: Never used  Substance and Sexual Activity   Alcohol use: No   Drug use: No   Sexual activity: Not on file  Other Topics Concern   Not on file  Social History Narrative   Not on file   Social Determinants of Health   Financial Resource Strain: Low Risk  (05/12/2022)   Overall Financial Resource Strain (CARDIA)    Difficulty of Paying Living Expenses: Not hard at all  Food Insecurity: No Food Insecurity (05/12/2022)   Hunger Vital Sign    Worried About Running Out of Food in the Last Year: Never true    [place] in the Last Year: Never true  Transportation Needs: No Transportation Needs (05/12/2022)   PRAPARE - Hydrologist (Medical): No    Lack of Transportation (Non-Medical): No  Physical Activity: Insufficiently Active (05/12/2022)   Exercise Vital Sign    Days of Exercise per Week: 4 days    Minutes of Exercise per Session: 30 min  Stress: No Stress Concern Present (05/12/2022)   [place]    Feeling of Stress : Not at all  Social Connections: Moderately Integrated (05/12/2022)   Social Connection and Isolation Panel [NHANES]    Frequency of Communication with Friends and Family: More than three times a week    Frequency of Social Gatherings with Friends and Family: Patient refused    Attends Religious Services: More than 4 times per year    Active Member of [employer] or Organizations: Yes    Attends Archivist Meetings: More than 4 times per year    Marital Status: Widowed    Tobacco Counseling Counseling given: Not Answered Tobacco comments: over 30 years ago   Clinical Intake:  Pre-visit preparation completed: Yes  Pain : No/denies pain      Nutritional Risks: None Diabetes: Yes CBG done?: No  How often do you need to have someone help you when you read instructions, pamphlets, or other written materials from your doctor or pharmacy?: 1 - Never  Diabetic?Yes  Interpreter Needed?: No      Activities of Daily Living    05/12/2022   11:46 AM  In your present state of health, do you have any difficulty performing the following activities:  Hearing? 0  Vision? 0  Difficulty concentrating or making decisions? 0  Walking or climbing stairs? 0  Dressing or bathing?  0  Doing errands, shopping? 0  Preparing Food and eating ? N  Using the Toilet? N  In the past six months, have you accidently leaked urine? N  Do you have problems with loss of bowel control? N  Managing your Medications? N  Managing your Finances? N  Housekeeping or managing your Housekeeping? N    Patient Care Team: Charlott Rakes, MD as PCP - General (Family Medicine)  Indicate any recent Medical Services you may have received from other than [facility] providers in the past year (date may be approximate).     Assessment:   This is a routine wellness examination for Johann.  Hearing/Vision screen No results found.  Dietary issues and exercise activities discussed: Current Exercise Habits: Home exercise routine, Type of exercise: walking, Time (Minutes): 10, Frequency (Times/Week): 7, Weekly Exercise (Minutes/Week): 70, Intensity: Mild, Exercise limited by: None identified   Goals Addressed   None    Depression Screen    05/12/2022   11:40 AM 01/17/2022    9:17 AM 09/26/2021    2:20 PM 03/25/2021   10:08 AM 11/16/2020   10:38 AM 08/16/2020    8:43 AM 07/18/2019   11:41 AM  PHQ 2/9 Scores  PHQ - 2 Score 0 0 0 0 0 0 0  PHQ- 9 Score  0 0  0 0     Fall Risk    05/12/2022   11:39 AM 04/25/2022    8:43 AM 01/17/2022    8:43 AM 01/02/2022    2:13 PM 10/03/2021   12:49 PM  Fall Risk   Falls in the past year? 0 0 0 0 0  Number falls in past yr:  0 0 0 0 0  Injury with Fall? 0 0 0 0 0  Risk for fall due to : No Fall Risks No Fall Risks  Impaired mobility Impaired mobility  Follow up    Falls evaluation completed Falls evaluation completed    FALL RISK PREVENTION PERTAINING TO THE HOME:  Any stairs in or around the home? Yes  If so, are there any without handrails? No  Home free of loose throw rugs in walkways, pet beds, electrical cords, etc? Yes  Adequate lighting in your home to reduce risk of falls? Yes   ASSISTIVE DEVICES UTILIZED TO PREVENT FALLS:  Life alert? No  Use of a cane, walker or w/c? No  Grab bars in the bathroom? Yes  Shower chair or bench in shower? No  Elevated toilet seat or a handicapped toilet? No   TIMED UP AND GO:  Was the test performed? No .  Length of time to ambulate 10 feet: N/A sec.   Gait slow and steady without use of assistive device  Cognitive Function:    05/12/2022   11:41 AM  MMSE - Mini Mental State Exam  Orientation to time 5  Orientation to Place 5  Registration 3  Attention/ Calculation 5  Recall 3  Language- name 2 objects 2  Language- repeat 1  Language- follow 3 step command 3  Language- read & follow direction 1  Write a sentence 1  Copy design 1  Total score 30        05/12/2022   11:40 AM 03/25/2021   10:17 AM  6CIT Screen  What Year? 0 points 0 points  What month? 0 points 0 points  What time? 0 points 0 points  Count back from 20 0 points 0 points  Months in reverse 0 points 0 points  Repeat phrase 0 points 0 points  Total Score 0 points 0 points    Immunizations Immunization History  Administered Date(s) Administered   Fluad Quad(high Dose 65+) 07/18/2019   Influenza Split 08/08/2012, 04/06/2015   Influenza,inj,Quad PF,6+ Mos 06/04/2018, 05/13/2020   Influenza-Unspecified 02/16/2016   PFIZER(Purple Top)SARS-COV-2 Vaccination 09/11/2019, 10/07/2019   Pneumococcal Conjugate-13 04/10/2017   Pneumococcal Polysaccharide-23 02/01/2016   Tdap  02/21/2018    Pneumococcal vaccine status: Up to date  Flu Vaccine status: Up to date  Pneumococcal vaccine status: Up to date  Covid-19 vaccine status: Completed vaccines  Qualifies for Shingles Vaccine? Yes   Zostavax completed  2nd dose is needed   Shingrix Completed?: No.    Education has been provided regarding the importance of this vaccine. Patient has been advised to call insurance company to determine out of pocket expense if they have not yet received this vaccine. Advised may also receive vaccine at local pharmacy or Health Dept. Verbalized acceptance and understanding.  Screening Tests Health Maintenance  Topic Date Due   Zoster Vaccines- Shingrix (1 of 2) Never done   COVID-19 Vaccine (3 - Pfizer series) 12/02/2019   INFLUENZA VACCINE  02/07/2022   Medicare Annual Wellness (AWV)  03/25/2022   Diabetic kidney evaluation - Urine ACR  09/27/2022   FOOT EXAM  09/27/2022   HEMOGLOBIN A1C  10/25/2022   OPHTHALMOLOGY EXAM  04/11/2023   Diabetic kidney evaluation - GFR measurement  04/26/2023   COLONOSCOPY (Pts 45-44yr Insurance coverage will need to be confirmed)  11/02/2026   TETANUS/TDAP  02/22/2028   Pneumonia Vaccine 71 Years old  Completed   Hepatitis C Screening  Completed   HPV VACCINES  Aged Out    Health Maintenance  Health Maintenance Due  Topic Date Due   Zoster Vaccines- Shingrix (1 of 2) Never done   COVID-19 Vaccine (3 - Pfizer series) 12/02/2019   INFLUENZA VACCINE  02/07/2022   Medicare Annual Wellness (AWV)  03/25/2022    Colorectal cancer screening: Type of screening: Colonoscopy. Completed 11/01/2016. Repeat every 10 years  Lung Cancer Screening: (Low Dose CT Chest recommended if Age 73-80years, 30 pack-year currently smoking OR have quit w/in 15years.) does not qualify.   Lung Cancer Screening Referral: No  Additional Screening:  Hepatitis C Screening: does qualify; Completed 10/10/2017  Vision Screening: Recommended annual ophthalmology  exams for early detection of glaucoma and other disorders of the eye. Is the patient up to date with their annual eye exam?  Yes  Who is the provider or what is the name of the office in which the patient attends annual eye exams? [facility] If pt is not established with a provider, would they like to be referred to a provider to establish care? No .   Dental Screening: Recommended annual dental exams for proper oral hygiene  Community Resource Referral / Chronic Care Management: CRR required this visit?  No   CCM required this visit?  No      Plan:     I have personally reviewed and noted the following in the patient's chart:   Medical and social history Use of alcohol, tobacco or illicit drugs  Current medications and supplements including opioid prescriptions. Patient is not currently taking opioid prescriptions. Functional ability and status Nutritional status Physical activity Advanced directives List of other physicians Hospitalizations, surgeries, and ER visits in previous 12 months Vitals Screenings to include cognitive, depression, and falls Referrals and appointments  In addition, I have reviewed and discussed with patient  certain preventive protocols, quality metrics, and best practice recommendations. A written personalized care plan for preventive services as well as general preventive health recommendations were provided to patient.     Gomez Cleverly, [place]   05/12/2022   Nurse Notes: I spent 30 minutes on this telephone encounter AVS mailed to patinet

## 2022-05-15 ENCOUNTER — Ambulatory Visit
Admission: RE | Admit: 2022-05-15 | Discharge: 2022-05-15 | Disposition: A | Discharge status: Home / Self Care | Payer: Medicare Other | Source: Ambulatory Visit | Attending: Nephrology | Admitting: Nephrology

## 2022-05-15 DIAGNOSIS — N1832 Chronic kidney disease, stage 3b: Secondary | ICD-10-CM

## 2022-05-15 DIAGNOSIS — I159 Secondary hypertension, unspecified: Secondary | ICD-10-CM

## 2022-05-15 DIAGNOSIS — N281 Cyst of kidney, acquired: Secondary | ICD-10-CM | POA: Diagnosis not present

## 2022-05-15 DIAGNOSIS — N189 Chronic kidney disease, unspecified: Secondary | ICD-10-CM | POA: Diagnosis not present

## 2022-05-15 DIAGNOSIS — E1122 Type 2 diabetes mellitus with diabetic chronic kidney disease: Secondary | ICD-10-CM

## 2022-05-30 ENCOUNTER — Other Ambulatory Visit: Payer: Self-pay

## 2022-06-13 ENCOUNTER — Ambulatory Visit: Payer: Medicare Other | Attending: Family Medicine | Admitting: Family Medicine

## 2022-06-13 ENCOUNTER — Encounter: Payer: Self-pay | Admitting: Family Medicine

## 2022-06-13 VITALS — BP 154/79 | HR 82 | Ht 64.0 in | Wt 151.8 lb

## 2022-06-13 DIAGNOSIS — E1159 Type 2 diabetes mellitus with other circulatory complications: Secondary | ICD-10-CM

## 2022-06-13 DIAGNOSIS — Z794 Long term (current) use of insulin: Secondary | ICD-10-CM

## 2022-06-13 DIAGNOSIS — I152 Hypertension secondary to endocrine disorders: Secondary | ICD-10-CM

## 2022-06-13 DIAGNOSIS — E1142 Type 2 diabetes mellitus with diabetic polyneuropathy: Secondary | ICD-10-CM

## 2022-06-13 LAB — GLUCOSE, POCT (MANUAL RESULT ENTRY): POC Glucose: 90 mg/dl (ref 70–99)

## 2022-06-13 NOTE — Patient Instructions (Signed)
Diabetes Mellitus Action Plan Following a diabetes action plan is a way for you to manage your diabetes (diabetes mellitus) symptoms. The plan is color-coded to help you understand what actions you need to take based on any symptoms you are having. If you have symptoms in the red zone, you need medical care right away. If you have symptoms in the yellow zone, you are having problems. If you have symptoms in the green zone, you are doing well. Learning about and understanding diabetes can take time. Follow the plan that you develop with your health care provider. Know the target range for your blood sugar (glucose) level, and review your treatment plan with your health care provider at each visit. The target range for my blood sugar level is __________________________ mg/dL. Red zone Get medical help right away if you have any of the following symptoms: A blood sugar test result that is below 54 mg/dL (3 mmol/L). A blood sugar test result that is at or above 240 mg/dL (13.3 mmol/L) for 2 days in a row. Confusion or trouble thinking clearly. Difficulty breathing. Sickness or a fever for 2 or more days that is not getting better. Moderate or large ketone levels in your urine. Feeling tired or having no energy. If you have any red zone symptoms, do not wait to see if the symptoms will go away. Get medical help right away. Call your local emergency services (911 in the [place] Do not drive yourself to the hospital. If you have severely low blood sugar (severe hypoglycemia) and you cannot eat or drink, you may need glucagon. Make sure a family member or close friend knows how to check your blood sugar and how to give you glucagon. You may need to be treated in a hospital for this condition. Yellow zone If you have any of the following symptoms, your diabetes is not under control and you may need to make some changes: A blood sugar test result that is at or above 240 mg/dL (13.3 mmol/L) for 2 days in a  row. Blood sugar test results that are below 70 mg/dL (3.9 mmol/L). Other symptoms of hypoglycemia, such as: Shaking or feeling light-headed. Confusion or irritability. Feeling hungry. Having a fast heartbeat. If you have any yellow zone symptoms: Treat your hypoglycemia by eating or drinking 15 grams of a rapid-acting carbohydrate. Follow the 15:15 rule: Take 15 grams of a rapid-acting carbohydrate, such as: 1 tube of glucose gel. 4 glucose pills. 4 oz (120 mL) of fruit juice. 4 oz (120 mL) of regular (not diet) soda. Check your blood sugar 15 minutes after you take the carbohydrate. If the repeat blood sugar test is still at or below 70 mg/dL (3.9 mmol/L), take 15 grams of a carbohydrate again. If your blood sugar does not increase above 70 mg/dL (3.9 mmol/L) after 3 tries, get medical help right away. After your blood sugar returns to normal, eat a meal or a snack within 1 hour. Keep taking your daily medicines as told by your health care provider. Check your blood sugar more often than you normally would. Write down your results. Call your health care provider if you have trouble keeping your blood sugar in your target range.  Green zone These signs mean you are doing well and you can continue what you are doing to manage your diabetes: Your blood sugar is within your personal target range. For most people, a blood sugar level before a meal (preprandial) should be 80-130 mg/dL (4.4-7.2 mmol/L). You feel   well, and you are able to do daily activities. If you are in the green zone, continue to manage your diabetes as told by your health care provider. To do this: Eat a healthy diet. Exercise regularly. Check your blood sugar as told by your health care provider. Take your medicines as told by your health care provider.  Where to find more information American Diabetes Association (ADA): diabetes.org Association of Diabetes Care & Education Specialists (ADCES):  diabeteseducator.org Summary Following a diabetes action plan is a way for you to manage your diabetes symptoms. The plan is color-coded to help you understand what actions you need to take based on any symptoms you are having. Follow the plan that you develop with your health care provider. Make sure you know your personal target blood sugar level. Review your treatment plan with your health care provider at each visit. This information is not intended to replace advice given to you by your health care provider. Make sure you discuss any questions you have with your health care provider. Document Revised: 01/01/2020 Document Reviewed: 01/01/2020 Elsevier Patient Education  2023 [employer]  

## 2022-06-13 NOTE — Progress Notes (Signed)
Subjective:  Patient ID: Jerry Serrano, male    DOB: 1950-08-16  Age: 71 y.o. MRN: [id number]  CC: Diabetes   HPI Jerry Serrano is a 71 y.o. year old male with a history of hypertension, hyperlipidemia, type 2 diabetes mellitus (A1c 10.6) who presents today for a  follow-up visit.     Interval History: He presents today for evaluation of his blood sugars.  Glipizide had been added at his last visit with plans to review his blood sugar log today but he came here in a hurry and did not bring his blood sugar log with him.  He is also not able to recall what he is sugars have been running at home. At his last visit Diovan/HCTZ was initiated in place of lisinopril/HCTZ due to suboptimal blood pressure. He is yet to take his antihypertensive today but states his blood pressures at home have been Within normal limit.  Denies presence of additional concerns. Past Medical History:  Diagnosis Date   Allergy    Arthritis    Diabetes mellitus    Hyperlipidemia    Hypertension     Past Surgical History:  Procedure Laterality Date   HERNIA REPAIR      Family History  Problem Relation Age of Onset   Colon cancer Neg Hx    Esophageal cancer Neg Hx    Rectal cancer Neg Hx    Stomach cancer Neg Hx     Social History   Socioeconomic History   Marital status: Married    Spouse name: Not on file   Number of children: Not on file   Years of education: Not on file   Highest education level: Not on file  Occupational History   Not on file  Tobacco Use   Smoking status: Former    Types: Cigarettes   Smokeless tobacco: Never   Tobacco comments:    over 30 years ago  Vaping Use   Vaping Use: Never used  Substance and Sexual Activity   Alcohol use: No   Drug use: No   Sexual activity: Not on file  Other Topics Concern   Not on file  Social History Narrative   Not on file   Social Determinants of Health   Financial Resource Strain: Low Risk  (05/12/2022)   Overall Financial  Resource Strain (CARDIA)    Difficulty of Paying Living Expenses: Not hard at all  Food Insecurity: No Food Insecurity (05/12/2022)   Hunger Vital Sign    Worried About Running Out of Food in the Last Year: Never true    [place] in the Last Year: Never true  Transportation Needs: No Transportation Needs (05/12/2022)   PRAPARE - Hydrologist (Medical): No    Lack of Transportation (Non-Medical): No  Physical Activity: Insufficiently Active (05/12/2022)   Exercise Vital Sign    Days of Exercise per Week: 4 days    Minutes of Exercise per Session: 30 min  Stress: No Stress Concern Present (05/12/2022)   [place]    Feeling of Stress : Not at all  Social Connections: Moderately Integrated (05/12/2022)   Social Connection and Isolation Panel [NHANES]    Frequency of Communication with Friends and Family: More than three times a week    Frequency of Social Gatherings with Friends and Family: Patient refused    Attends Religious Services: More than 4 times per year    Active  Member of Clubs or Organizations: Yes    Attends Music therapist: More than 4 times per year    Marital Status: Widowed    No Known Allergies  Outpatient Medications Prior to Visit  Medication Sig Dispense Refill   amLODipine (NORVASC) 10 MG tablet Take 1 tablet (10 mg total) by mouth daily. 90 tablet 0   aspirin EC 81 MG tablet Take 1 tablet (81 mg total) by mouth daily. 30 tablet 3   atorvastatin (LIPITOR) 40 MG tablet TAKE 1 TABLET (40 MG TOTAL) BY MOUTH DAILY. 90 tablet 1   betamethasone dipropionate 0.05 % cream APPLY EXTERNALLY TO THE AFFECTED AREA TWICE DAILY 30 g 0   Blood Glucose Monitoring Suppl (ACCU-CHEK AVIVA PLUS) w/Device KIT USE AS DIRECTED 4 TIMES DAILY AFTER MEALS AND AT BEDTIME. 1 kit 0   gabapentin (NEURONTIN) 300 MG capsule Take 1 capsule (300 mg total) by mouth daily. 90 capsule 1    glipiZIDE (GLUCOTROL) 5 MG tablet Take 1 tablet (5 mg total) by mouth 2 (two) times daily before a meal. 60 tablet 3   glucose blood (ACCU-CHEK AVIVA PLUS) test strip USE 1 STRIP TO CHECK GLUCOSE THREE TIMES DAILY FOR BLOOD SUGAR --MUST  HAVE  OFFICE  VISIT  FOR  REFILLS 300 each 2   insulin glargine (LANTUS) 100 UNIT/ML Solostar Pen Inject 32 Units into the skin daily. 30 mL 6   ketoconazole (NIZORAL) 2 % cream Apply 1 application topically daily. 15 g 0   Lancet Devices (ACCU-CHEK SOFTCLIX) lancets Use as instructed 1 each 12   MICROLET LANCETS MISC Test 3 times per day 100 each 12   olopatadine (PATANOL) 0.1 % ophthalmic solution Place 1 drop into both eyes 2 (two) times daily.     pantoprazole (PROTONIX) 40 MG tablet TAKE 1 TABLET BY MOUTH DAILY. 90 tablet 1   pantoprazole (PROTONIX) 40 MG tablet Take 1 tablet (40 mg total) by mouth daily. 30 tablet 2   sildenafil (VIAGRA) 50 MG tablet Take 1 tablet (50 mg total) by mouth daily as needed for erectile dysfunction. 10 tablet 1   traMADol (ULTRAM) 50 MG tablet Take 1 tablet (50 mg total) by mouth at bedtime as needed. 30 tablet 1   valsartan-hydrochlorothiazide (DIOVAN-HCT) 320-25 MG tablet Take 1 tablet by mouth daily. 90 tablet 1   Facility-Administered Medications Prior to Visit  Medication Dose Route Frequency Provider Last Rate Last Admin   0.9 %  sodium chloride infusion  500 mL Intravenous Continuous Danis, Estill Cotta III, MD         ROS Review of Systems  Constitutional:  Negative for activity change and appetite change.  HENT:  Negative for sinus pressure and sore throat.   Respiratory:  Negative for chest tightness, shortness of breath and wheezing.   Cardiovascular:  Negative for chest pain and palpitations.  Gastrointestinal:  Negative for abdominal distention, abdominal pain and constipation.  Genitourinary: Negative.   Musculoskeletal: Negative.   Psychiatric/Behavioral:  Negative for behavioral problems and dysphoric mood.      Objective:  BP (!) 154/79   Pulse 82   Ht _0  (1.626 m)   Wt 151 lb 12.8 oz (68.9 kg)   SpO2 98%   BMI 26.06 kg/m      06/13/2022    9:27 AM 04/25/2022    9:05 AM 04/25/2022    8:42 AM  BP/Weight  Systolic BP 361 443 154  Diastolic BP 79 82 81  Wt. (Lbs) 151.8  151.8  BMI 26.06 kg/m2  26.06 kg/m2      Physical Exam Constitutional:      Appearance: He is well-developed.  Cardiovascular:     Rate and Rhythm: Normal rate.     Heart sounds: Normal heart sounds. No murmur heard. Pulmonary:     Effort: Pulmonary effort is normal.     Breath sounds: Normal breath sounds. No wheezing or rales.  Chest:     Chest wall: No tenderness.  Abdominal:     General: Bowel sounds are normal. There is no distension.     Palpations: Abdomen is soft. There is no mass.     Tenderness: There is no abdominal tenderness.  Musculoskeletal:        General: Normal range of motion.     Right lower leg: No edema.     Left lower leg: No edema.  Neurological:     Mental Status: He is alert and oriented to person, place, and time.  Psychiatric:        Mood and Affect: Mood normal.        Latest Ref Rng & Units 04/25/2022    9:23 AM 01/31/2022    8:49 AM 01/17/2022    9:29 AM  CMP  Glucose 70 - 99 mg/dL 215  213  89   BUN 8 - 27 mg/dL 31  29  44   Creatinine 0.76 - 1.27 mg/dL 2.26  2.11  2.48   Sodium 134 - 144 mmol/L 138  136  139   Potassium 3.5 - 5.2 mmol/L 4.5  4.4  5.2   Chloride 96 - 106 mmol/L 101  100  103   CO2 20 - 29 mmol/L _0 Calcium 8.6 - 10.2 mg/dL 10.3  9.6  10.6   Total Protein 6.0 - 8.5 g/dL 7.4  7.3  7.6   Total Bilirubin 0.0 - 1.2 mg/dL 0.3  0.4  <0.2   Alkaline Phos 44 - 121 IU/L 141  92  82   AST 0 - 40 IU/L _1 ALT 0 - 44 IU/L _2 Lipid Panel     Component Value Date/Time   CHOL 158 01/17/2022 0929   TRIG 67 01/17/2022 0929   HDL 57 01/17/2022 0929   CHOLHDL 3.0 11/16/2020 1042   CHOLHDL 2.8 02/29/2016 1018   VLDL 12  02/29/2016 1018   LDLCALC 88 01/17/2022 0929    CBC    Component Value Date/Time   WBC 6.1 07/18/2019 1153   WBC 7.6 05/05/2013 1116   RBC 4.73 07/18/2019 1153   RBC 4.82 05/05/2013 1116   HGB 12.6 (L) 07/18/2019 1153   HCT 37.6 07/18/2019 1153   PLT 368 07/18/2019 1153   MCV 80 07/18/2019 1153   MCH 26.6 07/18/2019 1153   MCH 28.0 05/05/2013 1116   MCHC 33.5 07/18/2019 1153   MCHC 35.7 05/05/2013 1116   RDW 17.0 (H) 07/18/2019 1153   LYMPHSABS 2.0 07/18/2019 1153   MONOABS 0.7 05/05/2013 1116   EOSABS 0.4 07/18/2019 1153   BASOSABS 0.1 07/18/2019 1153    Lab Results  Component Value Date   HGBA1C 10.6 (A) 04/25/2022    Assessment & Plan:  1. Type 2 diabetes mellitus with diabetic polyneuropathy, with long-term current use of insulin (HCC) Uncontrolled with A1c of 10.6; goal is less than 7.0 Fasting blood sugar in the clinic is normal He does not  have his blood sugar log for review Will not make any regimen changes and he will continue with his current regimen A1c is due at next visit in 6 weeks Counseled on Diabetic diet, my plate method, 413 minutes of moderate intensity exercise/week Blood sugar logs with fasting goals of 80-120 mg/dl, random of less than 180 and in the event of sugars less than 60 mg/dl or greater than 400 mg/dl encouraged to notify the clinic. Advised on the need for annual eye exams, annual foot exams, Pneumonia vaccine. - POCT glucose (manual entry)  2. Hypertension associated with diabetes ([place]) Uncontrolled due to the fact that he is yet to take his antihypertensives Advised to always take his antihypertensive before his office visit Continue Diovan/HCTZ, amlodipine Counseled on blood pressure goal of less than 130/80, low-sodium, DASH diet, medication compliance, 150 minutes of moderate intensity exercise per week. Discussed medication compliance, adverse effects.     No orders of the defined types were placed in this  encounter.   Follow-up: Return in about 6 weeks (around 07/25/2022) for Medical conditions with PCP.       Charlott Rakes, MD, FAAFP. [facility] and [facility] [place], [place]   06/13/2022, 9:46 AM

## 2022-06-14 ENCOUNTER — Other Ambulatory Visit: Payer: Self-pay

## 2022-06-19 ENCOUNTER — Other Ambulatory Visit: Payer: Self-pay

## 2022-06-19 ENCOUNTER — Other Ambulatory Visit: Payer: Self-pay | Admitting: Family Medicine

## 2022-06-19 DIAGNOSIS — M19249 Secondary osteoarthritis, unspecified hand: Secondary | ICD-10-CM

## 2022-06-19 NOTE — Telephone Encounter (Signed)
Requested medication (s) are due for refill today -yes  Requested medication (s) are on the active medication list -yes  Future visit scheduled -yes  Last refill:  traMADol (ULTRAM) 50 MG tablet 30 tablet 1 01/17/2022    Notes to clinic: non delegated Rx  Requested Prescriptions  Pending Prescriptions Disp Refills   traMADol (ULTRAM) 50 MG tablet 30 tablet 1    Sig: Take 1 tablet (50 mg total) by mouth at bedtime as needed.     Not Delegated - Analgesics:  Opioid Agonists Failed - 06/19/2022  1:04 PM      Failed - This refill cannot be delegated      Failed - Urine Drug Screen completed in last 360 days      Passed - Valid encounter within last 3 months    Recent Outpatient Visits           6 days ago Type 2 diabetes mellitus with diabetic polyneuropathy, with long-term current use of insulin ([place])   [place], Luana, MD   1 month ago Type 2 diabetes mellitus with diabetic polyneuropathy, with long-term current use of insulin ([place])   [place], [place], MD   5 months ago Type 2 diabetes mellitus with hypoglycemia without coma, with long-term current use of insulin ([place])   [place], [place], MD   8 months ago Type 2 diabetes mellitus with hypoglycemia without coma, with long-term current use of insulin ([place])   [place], [place], MD   1 year ago Type 2 diabetes mellitus with diabetic polyneuropathy, with long-term current use of insulin ([place])   [place], Charlane Ferretti, MD       Future Appointments             In 1 month Charlott Rakes, MD [place]               Requested Prescriptions  Pending Prescriptions Disp Refills   traMADol (ULTRAM) 50 MG tablet 30 tablet 1    Sig: Take 1 tablet (50 mg total) by mouth at bedtime as needed.     Not  Delegated - Analgesics:  Opioid Agonists Failed - 06/19/2022  1:04 PM      Failed - This refill cannot be delegated      Failed - Urine Drug Screen completed in last 360 days      Passed - Valid encounter within last 3 months    Recent Outpatient Visits           6 days ago Type 2 diabetes mellitus with diabetic polyneuropathy, with long-term current use of insulin ([place])   [place], [place], MD   1 month ago Type 2 diabetes mellitus with diabetic polyneuropathy, with long-term current use of insulin ([place])   [place], [place], MD   5 months ago Type 2 diabetes mellitus with hypoglycemia without coma, with long-term current use of insulin ([place])   [place], [place], MD   8 months ago Type 2 diabetes mellitus with hypoglycemia without coma, with long-term current use of insulin ([place])   [place], Charlane Ferretti, MD   1 year ago Type 2 diabetes mellitus with diabetic polyneuropathy, with long-term current use of insulin ([place])   [place]  [place], MD       Future Appointments             In 1 month Charlott Rakes, MD [place]

## 2022-06-20 ENCOUNTER — Other Ambulatory Visit: Payer: Self-pay

## 2022-06-20 MED ORDER — TRAMADOL HCL 50 MG PO TABS
50.0000 mg | ORAL_TABLET | Freq: Every evening | ORAL | 1 refills | Status: DC | PRN
  Filled 2022-06-20: qty 30, 30d supply, fill #0
  Filled 2022-08-17: qty 30, 30d supply, fill #1

## 2022-06-23 ENCOUNTER — Other Ambulatory Visit: Payer: Self-pay

## 2022-06-26 ENCOUNTER — Other Ambulatory Visit: Payer: Self-pay

## 2022-06-26 DIAGNOSIS — N1832 Chronic kidney disease, stage 3b: Secondary | ICD-10-CM | POA: Diagnosis not present

## 2022-06-26 DIAGNOSIS — N281 Cyst of kidney, acquired: Secondary | ICD-10-CM | POA: Diagnosis not present

## 2022-06-29 ENCOUNTER — Other Ambulatory Visit: Payer: Self-pay

## 2022-07-04 ENCOUNTER — Other Ambulatory Visit: Payer: Self-pay

## 2022-07-20 ENCOUNTER — Other Ambulatory Visit: Payer: Self-pay | Admitting: Family Medicine

## 2022-07-20 ENCOUNTER — Other Ambulatory Visit: Payer: Self-pay

## 2022-07-20 DIAGNOSIS — I152 Hypertension secondary to endocrine disorders: Secondary | ICD-10-CM

## 2022-07-20 MED ORDER — AMLODIPINE BESYLATE 10 MG PO TABS
10.0000 mg | ORAL_TABLET | Freq: Every day | ORAL | 0 refills | Status: DC
  Filled 2022-07-20: qty 90, 90d supply, fill #0

## 2022-07-25 ENCOUNTER — Other Ambulatory Visit: Payer: Self-pay

## 2022-07-25 ENCOUNTER — Encounter: Payer: Self-pay | Admitting: Family Medicine

## 2022-07-25 ENCOUNTER — Ambulatory Visit: Payer: Medicare Other | Attending: Family Medicine | Admitting: Family Medicine

## 2022-07-25 VITALS — BP 158/84 | HR 98 | Ht 64.0 in | Wt 152.4 lb

## 2022-07-25 DIAGNOSIS — Z794 Long term (current) use of insulin: Secondary | ICD-10-CM

## 2022-07-25 DIAGNOSIS — E1159 Type 2 diabetes mellitus with other circulatory complications: Secondary | ICD-10-CM

## 2022-07-25 DIAGNOSIS — I129 Hypertensive chronic kidney disease with stage 1 through stage 4 chronic kidney disease, or unspecified chronic kidney disease: Secondary | ICD-10-CM | POA: Diagnosis not present

## 2022-07-25 DIAGNOSIS — E1142 Type 2 diabetes mellitus with diabetic polyneuropathy: Secondary | ICD-10-CM

## 2022-07-25 DIAGNOSIS — I152 Hypertension secondary to endocrine disorders: Secondary | ICD-10-CM | POA: Diagnosis not present

## 2022-07-25 DIAGNOSIS — N1832 Chronic kidney disease, stage 3b: Secondary | ICD-10-CM

## 2022-07-25 DIAGNOSIS — E1122 Type 2 diabetes mellitus with diabetic chronic kidney disease: Secondary | ICD-10-CM | POA: Diagnosis not present

## 2022-07-25 LAB — POCT GLYCOSYLATED HEMOGLOBIN (HGB A1C): HbA1c, POC (controlled diabetic range): 10.2 % — AB (ref 0.0–7.0)

## 2022-07-25 LAB — GLUCOSE, POCT (MANUAL RESULT ENTRY): POC Glucose: 286 mg/dl — AB (ref 70–99)

## 2022-07-25 MED ORDER — INSULIN GLARGINE 100 UNIT/ML SOLOSTAR PEN
35.0000 [IU] | PEN_INJECTOR | Freq: Every day | SUBCUTANEOUS | 6 refills | Status: DC
  Filled 2022-07-25: qty 9, 25d supply, fill #0
  Filled 2022-08-23: qty 9, 25d supply, fill #1
  Filled 2022-09-19: qty 9, 25d supply, fill #2
  Filled 2022-11-20: qty 9, 25d supply, fill #3
  Filled 2023-01-07: qty 9, 25d supply, fill #4
  Filled 2023-02-05: qty 9, 25d supply, fill #5
  Filled 2023-03-19: qty 9, 25d supply, fill #6

## 2022-07-25 MED ORDER — VALSARTAN-HYDROCHLOROTHIAZIDE 320-25 MG PO TABS
1.0000 | ORAL_TABLET | Freq: Every day | ORAL | 1 refills | Status: DC
  Filled 2022-07-25 – 2022-10-09 (×5): qty 90, 90d supply, fill #0
  Filled 2023-01-25: qty 90, 90d supply, fill #1

## 2022-07-25 MED ORDER — AMLODIPINE BESYLATE 10 MG PO TABS
10.0000 mg | ORAL_TABLET | Freq: Every day | ORAL | 1 refills | Status: DC
  Filled 2022-07-25: qty 90, 90d supply, fill #0

## 2022-07-25 MED ORDER — GABAPENTIN 300 MG PO CAPS
300.0000 mg | ORAL_CAPSULE | Freq: Every day | ORAL | 1 refills | Status: DC
  Filled 2022-07-25 – 2022-09-19 (×2): qty 90, 90d supply, fill #0
  Filled 2023-01-08: qty 90, 90d supply, fill #1

## 2022-07-25 MED ORDER — CARVEDILOL 3.125 MG PO TABS
3.1250 mg | ORAL_TABLET | Freq: Two times a day (BID) | ORAL | 3 refills | Status: DC
  Filled 2022-07-25: qty 60, 30d supply, fill #0
  Filled 2022-08-25: qty 60, 30d supply, fill #1
  Filled 2022-10-01: qty 60, 30d supply, fill #2
  Filled 2022-11-06: qty 60, 30d supply, fill #3

## 2022-07-25 NOTE — Progress Notes (Signed)
Patient stated he has not been taking his insulin

## 2022-07-25 NOTE — Patient Instructions (Signed)

## 2022-07-25 NOTE — Progress Notes (Signed)
Subjective:  Patient ID: Jerry Serrano, male    DOB: [birth year]  Age: 72 y.o. MRN: [id number]  CC: Diabetes   HPI Jerry Serrano is a 72 y.o. year old male with a history of hypertension, hyperlipidemia, type 2 diabetes mellitus (A1c 10.2), stage III CKD who presents today for a  follow-up visit.    Interval History:  He is A1c is 10.2 and at last office visit it was 10.2.  He has not been administering insulin for the last week as he has a problem with his pen needles.  Metformin had to be discontinued due to CKD and this was substituted with glipizide (of note he has had problems with hypoglycemia glipizide in the past hence he was started on low-dose glipizide at 5 mg twice daily. He stopped checking his blood sugars for the last week.  Does not have his blood sugar logs with him today.  Blood pressure is also elevated. His BP log reveals BP in the range of 122-151/62-79 at home and he endorses adherence with his antihypertensive.  He saw [facility] for evaluation of his CKD and informs me he was told everything was stable. Past Medical History:  Diagnosis Date   Allergy    Arthritis    Diabetes mellitus    Hyperlipidemia    Hypertension     Past Surgical History:  Procedure Laterality Date   HERNIA REPAIR      Family History  Problem Relation Age of Onset   Colon cancer Neg Hx    Esophageal cancer Neg Hx    Rectal cancer Neg Hx    Stomach cancer Neg Hx     Social History   Socioeconomic History   Marital status: Married    Spouse name: Not on file   Number of children: Not on file   Years of education: Not on file   Highest education level: Not on file  Occupational History   Not on file  Tobacco Use   Smoking status: Former    Types: Cigarettes   Smokeless tobacco: Never   Tobacco comments:    over 30 years ago  Vaping Use   Vaping Use: Never used  Substance and Sexual Activity   Alcohol use: No   Drug use: No   Sexual activity:  Not on file  Other Topics Concern   Not on file  Social History Narrative   Not on file   Social Determinants of Health   Financial Resource Strain: Low Risk  (05/12/2022)   Overall Financial Resource Strain (CARDIA)    Difficulty of Paying Living Expenses: Not hard at all  Food Insecurity: No Food Insecurity (05/12/2022)   Hunger Vital Sign    Worried About Running Out of Food in the Last Year: Never true    [place] in the Last Year: Never true  Transportation Needs: No Transportation Needs (05/12/2022)   PRAPARE - Hydrologist (Medical): No    Lack of Transportation (Non-Medical): No  Physical Activity: Insufficiently Active (05/12/2022)   Exercise Vital Sign    Days of Exercise per Week: 4 days    Minutes of Exercise per Session: 30 min  Stress: No Stress Concern Present (05/12/2022)   [place]    Feeling of Stress : Not at all  Social Connections: Moderately Integrated (05/12/2022)   Social Connection and Isolation Panel [NHANES]    Frequency of Communication with  Friends and Family: More than three times a week    Frequency of Social Gatherings with Friends and Family: Patient refused    Attends Religious Services: More than 4 times per year    Active Member of [employer] or Organizations: Yes    Attends Archivist Meetings: More than 4 times per year    Marital Status: Widowed    No Known Allergies  Outpatient Medications Prior to Visit  Medication Sig Dispense Refill   aspirin EC 81 MG tablet Take 1 tablet (81 mg total) by mouth daily. 30 tablet 3   atorvastatin (LIPITOR) 40 MG tablet TAKE 1 TABLET (40 MG TOTAL) BY MOUTH DAILY. 90 tablet 1   betamethasone dipropionate 0.05 % cream APPLY EXTERNALLY TO THE AFFECTED AREA TWICE DAILY 30 g 0   Blood Glucose Monitoring Suppl (ACCU-CHEK AVIVA PLUS) w/Device KIT USE AS DIRECTED 4 TIMES DAILY AFTER MEALS AND AT BEDTIME. 1  kit 0   glipiZIDE (GLUCOTROL) 5 MG tablet Take 1 tablet (5 mg total) by mouth 2 (two) times daily before a meal. 60 tablet 3   glucose blood (ACCU-CHEK AVIVA PLUS) test strip USE 1 STRIP TO CHECK GLUCOSE THREE TIMES DAILY FOR BLOOD SUGAR --MUST  HAVE  OFFICE  VISIT  FOR  REFILLS 300 each 2   ketoconazole (NIZORAL) 2 % cream Apply 1 application topically daily. 15 g 0   Lancet Devices (ACCU-CHEK SOFTCLIX) lancets Use as instructed 1 each 12   MICROLET LANCETS MISC Test 3 times per day 100 each 12   olopatadine (PATANOL) 0.1 % ophthalmic solution Place 1 drop into both eyes 2 (two) times daily.     pantoprazole (PROTONIX) 40 MG tablet TAKE 1 TABLET BY MOUTH DAILY. 90 tablet 1   pantoprazole (PROTONIX) 40 MG tablet Take 1 tablet (40 mg total) by mouth daily. 30 tablet 2   sildenafil (VIAGRA) 50 MG tablet Take 1 tablet (50 mg total) by mouth daily as needed for erectile dysfunction. 10 tablet 1   traMADol (ULTRAM) 50 MG tablet Take 1 tablet (50 mg total) by mouth at bedtime as needed. 30 tablet 1   amLODipine (NORVASC) 10 MG tablet Take 1 tablet (10 mg total) by mouth daily. 90 tablet 0   gabapentin (NEURONTIN) 300 MG capsule Take 1 capsule (300 mg total) by mouth daily. 90 capsule 1   valsartan-hydrochlorothiazide (DIOVAN-HCT) 320-25 MG tablet Take 1 tablet by mouth daily. 90 tablet 1   insulin glargine (LANTUS) 100 UNIT/ML Solostar Pen Inject 32 Units into the skin daily. (Patient not taking: Reported on 07/25/2022) 30 mL 6   Facility-Administered Medications Prior to Visit  Medication Dose Route Frequency Provider Last Rate Last Admin   0.9 %  sodium chloride infusion  500 mL Intravenous Continuous Danis, Estill Cotta III, MD         ROS Review of Systems  Constitutional:  Negative for activity change and appetite change.  HENT:  Negative for sinus pressure and sore throat.   Respiratory:  Negative for chest tightness, shortness of breath and wheezing.   Cardiovascular:  Negative for chest pain  and palpitations.  Gastrointestinal:  Negative for abdominal distention, abdominal pain and constipation.  Genitourinary: Negative.   Musculoskeletal: Negative.   Psychiatric/Behavioral:  Negative for behavioral problems and dysphoric mood.     Objective:  BP (!) 158/84   Pulse 98   Ht '5\' 4"'$  (1.626 m)   Wt 152 lb 6.4 oz (69.1 kg)   SpO2 92%   BMI  26.16 kg/m      07/25/2022   11:37 AM 07/25/2022   11:11 AM 06/13/2022    9:27 AM  BP/Weight  Systolic BP 161 096 045  Diastolic BP 84 81 79  Wt. (Lbs)  152.4 151.8  BMI  26.16 kg/m2 26.06 kg/m2      Physical Exam Constitutional:      Appearance: He is well-developed.  Cardiovascular:     Rate and Rhythm: Normal rate.     Heart sounds: Normal heart sounds. No murmur heard. Pulmonary:     Effort: Pulmonary effort is normal.     Breath sounds: Normal breath sounds. No wheezing or rales.  Chest:     Chest wall: No tenderness.  Abdominal:     General: Bowel sounds are normal. There is no distension.     Palpations: Abdomen is soft. There is no mass.     Tenderness: There is no abdominal tenderness.  Musculoskeletal:        General: Normal range of motion.     Right lower leg: No edema.     Left lower leg: No edema.  Neurological:     Mental Status: He is alert and oriented to person, place, and time.  Psychiatric:        Mood and Affect: Mood normal.        Latest Ref Rng & Units 04/25/2022    9:23 AM 01/31/2022    8:49 AM 01/17/2022    9:29 AM  CMP  Glucose 70 - 99 mg/dL 215  213  89   BUN 8 - 27 mg/dL 31  29  44   Creatinine 0.76 - 1.27 mg/dL 2.26  2.11  2.48   Sodium 134 - 144 mmol/L 138  136  139   Potassium 3.5 - 5.2 mmol/L 4.5  4.4  5.2   Chloride 96 - 106 mmol/L 101  100  103   CO2 20 - 29 mmol/L '20  22  20   '$ Calcium 8.6 - 10.2 mg/dL 10.3  9.6  10.6   Total Protein 6.0 - 8.5 g/dL 7.4  7.3  7.6   Total Bilirubin 0.0 - 1.2 mg/dL 0.3  0.4  <0.2   Alkaline Phos 44 - 121 IU/L 141  92  82   AST 0 - 40 IU/L '21   29  20   '$ ALT 0 - 44 IU/L '25  23  17     '$ Lipid Panel     Component Value Date/Time   CHOL 158 01/17/2022 0929   TRIG 67 01/17/2022 0929   HDL 57 01/17/2022 0929   CHOLHDL 3.0 11/16/2020 1042   CHOLHDL 2.8 02/29/2016 1018   VLDL 12 02/29/2016 1018   LDLCALC 88 01/17/2022 0929    CBC    Component Value Date/Time   WBC 6.1 07/18/2019 1153   WBC 7.6 05/05/2013 1116   RBC 4.73 07/18/2019 1153   RBC 4.82 05/05/2013 1116   HGB 12.6 (L) 07/18/2019 1153   HCT 37.6 07/18/2019 1153   PLT 368 07/18/2019 1153   MCV 80 07/18/2019 1153   MCH 26.6 07/18/2019 1153   MCH 28.0 05/05/2013 1116   MCHC 33.5 07/18/2019 1153   MCHC 35.7 05/05/2013 1116   RDW 17.0 (H) 07/18/2019 1153   LYMPHSABS 2.0 07/18/2019 1153   MONOABS 0.7 05/05/2013 1116   EOSABS 0.4 07/18/2019 1153   BASOSABS 0.1 07/18/2019 1153    Lab Results  Component Value Date   HGBA1C 10.2 (A) 07/25/2022  Assessment & Plan:  1. Type 2 diabetes mellitus with diabetic polyneuropathy, with long-term current use of insulin (HCC) Controlled with A1c of 10.2, goal is less than 7.0 Nonadherence with insulin contributing He will be checking with the pharmacy today to have his problem with his pen needles resolved Increase Lantus dose from 32 units to 35 units; exercising caution due to the fact that he is on glipizide and has had hypoglycemia in the past with this. I will see him in 1 month to review his blood sugar log Counseled on Diabetic diet, my plate method, 102 minutes of moderate intensity exercise/week Blood sugar logs with fasting goals of 80-120 mg/dl, random of less than 180 and in the event of sugars less than 60 mg/dl or greater than 400 mg/dl encouraged to notify the clinic. Advised on the need for annual eye exams, annual foot exams, Pneumonia vaccine. - POCT glucose (manual entry) - POCT glycosylated hemoglobin (Hb A1C) - insulin glargine (LANTUS) 100 UNIT/ML Solostar Pen; Inject 35 Units into the skin daily.   Dispense: 30 mL; Refill: 6 - gabapentin (NEURONTIN) 300 MG capsule; Take 1 capsule (300 mg total) by mouth daily.  Dispense: 90 capsule; Refill: 1  2. Hypertension associated with diabetes ([place]) Uncontrolled Coreg added to regimen Counseled on blood pressure goal of less than 130/80, low-sodium, DASH diet, medication compliance, 150 minutes of moderate intensity exercise per week. Discussed medication compliance, adverse effects. - carvedilol (COREG) 3.125 MG tablet; Take 1 tablet (3.125 mg total) by mouth 2 (two) times daily with a meal.  Dispense: 60 tablet; Refill: 3 - amLODipine (NORVASC) 10 MG tablet; Take 1 tablet (10 mg total) by mouth daily.  Dispense: 90 tablet; Refill: 1  3. 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  Dispense: 90 tablet; Refill: 1    Meds ordered this encounter  Medications   insulin glargine (LANTUS) 100 UNIT/ML Solostar Pen    Sig: Inject 35 Units into the skin daily.    Dispense:  30 mL    Refill:  6   carvedilol (COREG) 3.125 MG tablet    Sig: Take 1 tablet (3.125 mg total) by mouth 2 (two) times daily with a meal.    Dispense:  60 tablet    Refill:  3   amLODipine (NORVASC) 10 MG tablet    Sig: Take 1 tablet (10 mg total) by mouth daily.    Dispense:  90 tablet    Refill:  1   gabapentin (NEURONTIN) 300 MG capsule    Sig: Take 1 capsule (300 mg total) by mouth daily.    Dispense:  90 capsule    Refill:  1   valsartan-hydrochlorothiazide (DIOVAN-HCT) 320-25 MG tablet    Sig: Take 1 tablet by mouth daily.    Dispense:  90 tablet    Refill:  1    Follow-up: Return in about 1 month (around 08/25/2022) for Diabetes and hypertension follow-up.       Charlott Rakes, MD, FAAFP. [facility] [place], [place]   07/25/2022, 12:55 PM

## 2022-08-08 ENCOUNTER — Other Ambulatory Visit: Payer: Self-pay

## 2022-08-08 ENCOUNTER — Other Ambulatory Visit (HOSPITAL_BASED_OUTPATIENT_CLINIC_OR_DEPARTMENT_OTHER): Payer: Self-pay

## 2022-08-09 ENCOUNTER — Other Ambulatory Visit: Payer: Self-pay

## 2022-08-14 ENCOUNTER — Other Ambulatory Visit: Payer: Self-pay

## 2022-08-18 ENCOUNTER — Other Ambulatory Visit: Payer: Self-pay

## 2022-08-24 ENCOUNTER — Other Ambulatory Visit: Payer: Self-pay

## 2022-08-25 ENCOUNTER — Other Ambulatory Visit: Payer: Self-pay

## 2022-08-28 ENCOUNTER — Other Ambulatory Visit: Payer: Self-pay

## 2022-09-05 ENCOUNTER — Other Ambulatory Visit: Payer: Self-pay

## 2022-09-08 ENCOUNTER — Other Ambulatory Visit: Payer: Self-pay | Admitting: Family Medicine

## 2022-09-08 ENCOUNTER — Other Ambulatory Visit: Payer: Self-pay

## 2022-09-08 DIAGNOSIS — Z794 Long term (current) use of insulin: Secondary | ICD-10-CM

## 2022-09-08 MED ORDER — GLIPIZIDE 5 MG PO TABS
5.0000 mg | ORAL_TABLET | Freq: Two times a day (BID) | ORAL | 0 refills | Status: DC
  Filled 2022-09-19: qty 60, 30d supply, fill #0

## 2022-09-19 ENCOUNTER — Other Ambulatory Visit: Payer: Self-pay

## 2022-09-21 ENCOUNTER — Other Ambulatory Visit: Payer: Self-pay

## 2022-09-21 NOTE — Progress Notes (Signed)
Patient seen by Park Liter, PharmD Candidate on 09/20/22 while they were picking up prescriptions at [facility].   Blood pressure today was : 159/91, HR 88; on recheck (if >140 or >90): 146/89, HR 85   Patient has an automated home blood pressure machine. Checks BP daily at home (typically 111/80-90, but sometimes higher SBP 150-160. Reports that it is typically lowest in the mornings. Checks BP daily at home. Last took his blood pressure medicines last evening. Has not had caffeine recently. Does not use tobacco products or alcohol.   Medication review was performed. They are taking medications as prescribed, but does not recognize the drug name carvedilol. Reports taking amlodipine and valsartan-HCTZ daily, but not always at a consistent time (sometimes in the morning vs afternoon or evening).   The following barriers to adherence were noted:  - They do not have cost concerns.  - They do not have transportation concerns.  - They do not need assistance obtaining refills.  - They do not occasionally forget to take some of their prescribed medications.  - They do not feel like one/some of their medications make them feel poorly.   - They do have follow up scheduled with their primary care provider/cardiologist.   The following interventions were completed:  - Patient was educated on goal blood pressures and long term health implications of elevated blood pressure  - Patient was educated on proper technique to check home blood pressure and reminded to bring home machine and readings to next provider appointment  - Patient was educated on medications, including indication and administration, including attempting to take medications at a consistent time each day.   The patient has follow up scheduled:  PCP: 09/26/22 Margarita Rana)   Signature: Park Liter, PharmD Candidate   Joseph Art, Pharm.D. PGY-2 Ambulatory Care Pharmacy Resident

## 2022-09-26 ENCOUNTER — Ambulatory Visit: Payer: Medicare Other | Attending: Family Medicine | Admitting: Family Medicine

## 2022-09-26 ENCOUNTER — Other Ambulatory Visit: Payer: Self-pay

## 2022-09-26 ENCOUNTER — Encounter: Payer: Self-pay | Admitting: Family Medicine

## 2022-09-26 ENCOUNTER — Other Ambulatory Visit: Payer: Self-pay | Admitting: Family Medicine

## 2022-09-26 ENCOUNTER — Telehealth: Payer: Self-pay | Admitting: Licensed Clinical Social Worker

## 2022-09-26 DIAGNOSIS — Z794 Long term (current) use of insulin: Secondary | ICD-10-CM

## 2022-09-26 DIAGNOSIS — E1169 Type 2 diabetes mellitus with other specified complication: Secondary | ICD-10-CM | POA: Diagnosis not present

## 2022-09-26 DIAGNOSIS — I152 Hypertension secondary to endocrine disorders: Secondary | ICD-10-CM

## 2022-09-26 DIAGNOSIS — E1159 Type 2 diabetes mellitus with other circulatory complications: Secondary | ICD-10-CM

## 2022-09-26 DIAGNOSIS — E1142 Type 2 diabetes mellitus with diabetic polyneuropathy: Secondary | ICD-10-CM | POA: Diagnosis not present

## 2022-09-26 DIAGNOSIS — E785 Hyperlipidemia, unspecified: Secondary | ICD-10-CM

## 2022-09-26 DIAGNOSIS — M19249 Secondary osteoarthritis, unspecified hand: Secondary | ICD-10-CM

## 2022-09-26 MED ORDER — GLIPIZIDE 5 MG PO TABS
5.0000 mg | ORAL_TABLET | Freq: Two times a day (BID) | ORAL | 1 refills | Status: DC
  Filled 2022-09-26 – 2022-10-21 (×2): qty 180, 90d supply, fill #0
  Filled 2023-02-11: qty 180, 90d supply, fill #1

## 2022-09-26 MED ORDER — ATORVASTATIN CALCIUM 40 MG PO TABS
40.0000 mg | ORAL_TABLET | Freq: Every day | ORAL | 1 refills | Status: DC
  Filled 2022-09-26: qty 90, fill #0
  Filled 2022-12-24: qty 90, 90d supply, fill #0

## 2022-09-26 MED ORDER — AMLODIPINE BESYLATE 10 MG PO TABS
10.0000 mg | ORAL_TABLET | Freq: Every day | ORAL | 1 refills | Status: DC
  Filled 2022-09-26 – 2022-10-09 (×2): qty 90, 90d supply, fill #0
  Filled 2023-01-25: qty 90, 90d supply, fill #1

## 2022-09-26 NOTE — Patient Instructions (Signed)
Visit Information  Thank you for taking time to visit with me today. Please don't hesitate to contact me if I can be of assistance to you.   If you are experiencing a Mental Health or [place] or need someone to talk to, please call the Suicide and Crisis Lifeline: 988 call 911   Patient verbalizes understanding of instructions and care plan provided today and agrees to view in [place]. Active MyChart status and patient understanding of how to access instructions and care plan via MyChart confirmed with patient.     No further follow up required: Patient is not in need of care coordination services at this time  Christa See, MSW, [email or web address] Phone [phone] 5:09 PM

## 2022-09-26 NOTE — Patient Outreach (Signed)
  Care Coordination   Initial Visit Note   09/26/2022 Name: ROMEN SPINNER MRN: [id number] DOB: 08/12/1950  Alethia Berthold is a 72 y.o. year old male who sees Charlott Rakes, MD for primary care. I spoke with  Brayton Mars Gesell by phone today.  What matters to the patients health and wellness today?  Care Coordination     SDOH assessments and interventions completed:  Yes  SDOH Interventions Today    Flowsheet Row Most Recent Value  SDOH Interventions   Food Insecurity Interventions Intervention Not Indicated  Housing Interventions Intervention Not Indicated  Transportation Interventions Intervention Not Indicated        Care Coordination Interventions:  Yes, provided  Interventions Today    Flowsheet Row Most Recent Value  Chronic Disease   Chronic disease during today's visit Hypertension (HTN), Diabetes  General Interventions   General Interventions Discussed/Reviewed General Interventions Discussed  [LCSW informed patient of care coordination services. Pt is not interested at this time and agreed to contact PCP, should needs arise]       Follow up plan: No further intervention required.   Encounter Outcome:  Pt. Visit Completed   Christa See, MSW, [email or web address] Phone [phone] 5:08 PM

## 2022-09-26 NOTE — Patient Instructions (Signed)
Managing Your Hypertension Hypertension, also called high blood pressure, is when the force of the blood pressing against the walls of the arteries is too strong. Arteries are blood vessels that carry blood from your heart throughout your body. Hypertension forces the heart to work harder to pump blood and may cause the arteries to become narrow or stiff. Understanding blood pressure readings A blood pressure reading includes a higher number over a lower number: The first, or top, number is called the systolic pressure. It is a measure of the pressure in your arteries as your heart beats. The second, or bottom number, is called the diastolic pressure. It is a measure of the pressure in your arteries as the heart relaxes. For most people, a normal blood pressure is below 120/80. Your personal target blood pressure may vary depending on your medical conditions, your age, and other factors. Blood pressure is classified into four stages. Based on your blood pressure reading, your health care provider may use the following stages to determine what type of treatment you need, if any. Systolic pressure and diastolic pressure are measured in a unit called millimeters of mercury (mmHg). Normal Systolic pressure: below 120. Diastolic pressure: below 80. Elevated Systolic pressure: 120-129. Diastolic pressure: below 80. Hypertension stage 1 Systolic pressure: 130-139. Diastolic pressure: 80-89. Hypertension stage 2 Systolic pressure: 140 or above. Diastolic pressure: 90 or above. How can this condition affect me? Managing your hypertension is very important. Over time, hypertension can damage the arteries and decrease blood flow to parts of the body, including the brain, heart, and kidneys. Having untreated or uncontrolled hypertension can lead to: A heart attack. A stroke. A weakened blood vessel (aneurysm). Heart failure. Kidney damage. Eye damage. Memory and concentration problems. Vascular  dementia. What actions can I take to manage this condition? Hypertension can be managed by making lifestyle changes and possibly by taking medicines. Your health care provider will help you make a plan to bring your blood pressure within a normal range. You may be referred for counseling on a healthy diet and physical activity. Nutrition  Eat a diet that is high in fiber and potassium, and low in salt (sodium), added sugar, and fat. An example eating plan is called the DASH diet. DASH stands for Dietary Approaches to Stop Hypertension. To eat this way: Eat plenty of fresh fruits and vegetables. Try to fill one-half of your plate at each meal with fruits and vegetables. Eat whole grains, such as whole-wheat pasta, brown rice, or whole-grain bread. Fill about one-fourth of your plate with whole grains. Eat low-fat dairy products. Avoid fatty cuts of meat, processed or cured meats, and poultry with skin. Fill about one-fourth of your plate with lean proteins such as fish, chicken without skin, beans, eggs, and tofu. Avoid pre-made and processed foods. These tend to be higher in sodium, added sugar, and fat. Reduce your daily sodium intake. Many people with hypertension should eat less than 1,500 mg of sodium a day. Lifestyle  Work with your health care provider to maintain a healthy body weight or to lose weight. Ask what an ideal weight is for you. Get at least 30 minutes of exercise that causes your heart to beat faster (aerobic exercise) most days of the week. Activities may include walking, swimming, or biking. Include exercise to strengthen your muscles (resistance exercise), such as weight lifting, as part of your weekly exercise routine. Try to do these types of exercises for 30 minutes at least 3 days a week. Do   not use any products that contain nicotine or tobacco. These products include cigarettes, chewing tobacco, and vaping devices, such as e-cigarettes. If you need help quitting, ask your  health care provider. Control any long-term (chronic) conditions you have, such as high cholesterol or diabetes. Identify your sources of stress and find ways to manage stress. This may include meditation, deep breathing, or making time for fun activities. Alcohol use Do not drink alcohol if: Your health care provider tells you not to drink. You are pregnant, may be pregnant, or are planning to become pregnant. If you drink alcohol: Limit how much you have to: 0-1 drink a day for women. 0-2 drinks a day for men. Know how much alcohol is in your drink. In the [place] one drink equals one 12 oz bottle of beer (355 mL), one 5 oz glass of wine (148 mL), or one 1 oz glass of hard liquor (44 mL). Medicines Your health care provider may prescribe medicine if lifestyle changes are not enough to get your blood pressure under control and if: Your systolic blood pressure is 130 or higher. Your diastolic blood pressure is 80 or higher. Take medicines only as told by your health care provider. Follow the directions carefully. Blood pressure medicines must be taken as told by your health care provider. The medicine does not work as well when you skip doses. Skipping doses also puts you at risk for problems. Monitoring Before you monitor your blood pressure: Do not smoke, drink caffeinated beverages, or exercise within 30 minutes before taking a measurement. Use the bathroom and empty your bladder (urinate). Sit quietly for at least 5 minutes before taking measurements. Monitor your blood pressure at home as told by your health care provider. To do this: Sit with your back straight and supported. Place your feet flat on the floor. Do not cross your legs. Support your arm on a flat surface, such as a table. Make sure your upper arm is at heart level. Each time you measure, take two or three readings one minute apart and record the results. You may also need to have your blood pressure checked regularly by  your health care provider. General information Talk with your health care provider about your diet, exercise habits, and other lifestyle factors that may be contributing to hypertension. Review all the medicines you take with your health care provider because there may be side effects or interactions. Keep all follow-up visits. Your health care provider can help you create and adjust your plan for managing your high blood pressure. Where to find more information National Heart, Lung, and Blood Institute: www.nhlbi.nih.gov American Heart Association: www.heart.org Contact a health care provider if: You think you are having a reaction to medicines you have taken. You have repeated (recurrent) headaches. You feel dizzy. You have swelling in your ankles. You have trouble with your vision. Get help right away if: You develop a severe headache or confusion. You have unusual weakness or numbness, or you feel faint. You have severe pain in your chest or abdomen. You vomit repeatedly. You have trouble breathing. These symptoms may be an emergency. Get help right away. Call 911. Do not wait to see if the symptoms will go away. Do not drive yourself to the hospital. Summary Hypertension is when the force of blood pumping through your arteries is too strong. If this condition is not controlled, it may put you at risk for serious complications. Your personal target blood pressure may vary depending on your medical conditions,   your age, and other factors. For most people, a normal blood pressure is less than 120/80. Hypertension is managed by lifestyle changes, medicines, or both. Lifestyle changes to help manage hypertension include losing weight, eating a healthy, low-sodium diet, exercising more, stopping smoking, and limiting alcohol. This information is not intended to replace advice given to you by your health care provider. Make sure you discuss any questions you have with your health care  provider. Document Revised: 03/10/2021 Document Reviewed: 03/10/2021 Elsevier Patient Education  2023 [employer] Inc.  

## 2022-09-26 NOTE — Progress Notes (Signed)
Subjective:  Patient ID: Jerry Serrano, male    DOB: [birth year]  Age: 72 y.o. MRN: [id number]  CC: Hypertension   HPI Jerry Serrano is a 72 y.o. year old male with a history of hypertension, hyperlipidemia, type 2 diabetes mellitus (A1c 10.2), stage III CKD who presents today for a  follow-up visit.    Interval History:  He did not take his antihypertensive prior to his appointment today but his BP log reveals controlled values - 116-131/63-78. Blood sugars at home have  improved and this morning it was 76, and has been in the 70 - 120 range with no hypoglycemia.  Doses adherence with his antihypertensives. Denies presence of additional concerns today. Past Medical History:  Diagnosis Date   Allergy    Arthritis    Diabetes mellitus    Hyperlipidemia    Hypertension     Past Surgical History:  Procedure Laterality Date   HERNIA REPAIR      Family History  Problem Relation Age of Onset   Colon cancer Neg Hx    Esophageal cancer Neg Hx    Rectal cancer Neg Hx    Stomach cancer Neg Hx     Social History   Socioeconomic History   Marital status: Married    Spouse name: Not on file   Number of children: Not on file   Years of education: Not on file   Highest education level: Not on file  Occupational History   Not on file  Tobacco Use   Smoking status: Former    Types: Cigarettes   Smokeless tobacco: Never   Tobacco comments:    over 30 years ago  Vaping Use   Vaping Use: Never used  Substance and Sexual Activity   Alcohol use: No   Drug use: No   Sexual activity: Not on file  Other Topics Concern   Not on file  Social History Narrative   Not on file   Social Determinants of Health   Financial Resource Strain: Low Risk  (05/12/2022)   Overall Financial Resource Strain (CARDIA)    Difficulty of Paying Living Expenses: Not hard at all  Food Insecurity: No Food Insecurity (05/12/2022)   Hunger Vital Sign    Worried About Running Out of Food in the  Last Year: Never true    [place] in the Last Year: Never true  Transportation Needs: No Transportation Needs (05/12/2022)   PRAPARE - Hydrologist (Medical): No    Lack of Transportation (Non-Medical): No  Physical Activity: Insufficiently Active (05/12/2022)   Exercise Vital Sign    Days of Exercise per Week: 4 days    Minutes of Exercise per Session: 30 min  Stress: No Stress Concern Present (05/12/2022)   [place]    Feeling of Stress : Not at all  Social Connections: Moderately Integrated (05/12/2022)   Social Connection and Isolation Panel [NHANES]    Frequency of Communication with Friends and Family: More than three times a week    Frequency of Social Gatherings with Friends and Family: Patient declined    Attends Religious Services: More than 4 times per year    Active Member of [employer] or Organizations: Yes    Attends Archivist Meetings: More than 4 times per year    Marital Status: Widowed    No Known Allergies  Outpatient Medications Prior to Visit  Medication Sig Dispense Refill  aspirin EC 81 MG tablet Take 1 tablet (81 mg total) by mouth daily. 30 tablet 3   betamethasone dipropionate 0.05 % cream APPLY EXTERNALLY TO THE AFFECTED AREA TWICE DAILY 30 g 0   Blood Glucose Monitoring Suppl (ACCU-CHEK AVIVA PLUS) w/Device KIT USE AS DIRECTED 4 TIMES DAILY AFTER MEALS AND AT BEDTIME. 1 kit 0   carvedilol (COREG) 3.125 MG tablet Take 1 tablet (3.125 mg total) by mouth 2 (two) times daily with a meal. 60 tablet 3   gabapentin (NEURONTIN) 300 MG capsule Take 1 capsule (300 mg total) by mouth daily. 90 capsule 1   glucose blood (ACCU-CHEK AVIVA PLUS) test strip USE 1 STRIP TO CHECK GLUCOSE THREE TIMES DAILY FOR BLOOD SUGAR --MUST  HAVE  OFFICE  VISIT  FOR  REFILLS 300 each 2   insulin glargine (LANTUS) 100 UNIT/ML Solostar Pen Inject 35 Units into the skin daily. 30 mL 6    ketoconazole (NIZORAL) 2 % cream Apply 1 application topically daily. 15 g 0   Lancet Devices (ACCU-CHEK SOFTCLIX) lancets Use as instructed 1 each 12   MICROLET LANCETS MISC Test 3 times per day 100 each 12   olopatadine (PATANOL) 0.1 % ophthalmic solution Place 1 drop into both eyes 2 (two) times daily.     pantoprazole (PROTONIX) 40 MG tablet TAKE 1 TABLET BY MOUTH DAILY. 90 tablet 1   pantoprazole (PROTONIX) 40 MG tablet Take 1 tablet (40 mg total) by mouth daily. 30 tablet 2   sildenafil (VIAGRA) 50 MG tablet Take 1 tablet (50 mg total) by mouth daily as needed for erectile dysfunction. 10 tablet 1   traMADol (ULTRAM) 50 MG tablet Take 1 tablet (50 mg total) by mouth at bedtime as needed. 30 tablet 1   valsartan-hydrochlorothiazide (DIOVAN-HCT) 320-25 MG tablet Take 1 tablet by mouth daily. 90 tablet 1   amLODipine (NORVASC) 10 MG tablet Take 1 tablet (10 mg total) by mouth daily. 90 tablet 1   atorvastatin (LIPITOR) 40 MG tablet TAKE 1 TABLET (40 MG TOTAL) BY MOUTH DAILY. 90 tablet 1   glipiZIDE (GLUCOTROL) 5 MG tablet Take 1 tablet (5 mg total) by mouth 2 (two) times daily before a meal. 60 tablet 0   Facility-Administered Medications Prior to Visit  Medication Dose Route Frequency Provider Last Rate Last Admin   0.9 %  sodium chloride infusion  500 mL Intravenous Continuous Danis, Estill Cotta III, MD         ROS Review of Systems  Constitutional:  Negative for activity change and appetite change.  HENT:  Negative for sinus pressure and sore throat.   Respiratory:  Negative for chest tightness, shortness of breath and wheezing.   Cardiovascular:  Negative for chest pain and palpitations.  Gastrointestinal:  Negative for abdominal distention, abdominal pain and constipation.  Genitourinary: Negative.   Musculoskeletal: Negative.   Psychiatric/Behavioral:  Negative for behavioral problems and dysphoric mood.     Objective:  BP (!) 159/77   Pulse 88   Temp 98.2 F (36.8 C)  (Oral)   Ht 5\' 4"  (1.626 m)   Wt 152 lb (68.9 kg)   SpO2 98%   BMI 26.09 kg/m      09/26/2022   10:00 AM 09/26/2022    9:41 AM 09/21/2022    2:15 PM  BP/Weight  Systolic BP [id number]  Diastolic BP 77 81 89  Wt. (Lbs)  152   BMI  26.09 kg/m2       Physical Exam Constitutional:  Appearance: He is well-developed.  Cardiovascular:     Rate and Rhythm: Normal rate.     Heart sounds: Normal heart sounds. No murmur heard. Pulmonary:     Effort: Pulmonary effort is normal.     Breath sounds: Normal breath sounds. No wheezing or rales.  Chest:     Chest wall: No tenderness.  Abdominal:     General: Bowel sounds are normal. There is no distension.     Palpations: Abdomen is soft. There is no mass.     Tenderness: There is no abdominal tenderness.  Musculoskeletal:        General: Normal range of motion.     Right lower leg: No edema.     Left lower leg: No edema.  Neurological:     Mental Status: He is alert and oriented to person, place, and time.  Psychiatric:        Mood and Affect: Mood normal.        Latest Ref Rng & Units 04/25/2022    9:23 AM 01/31/2022    8:49 AM 01/17/2022    9:29 AM  CMP  Glucose 70 - 99 mg/dL 215  213  89   BUN 8 - 27 mg/dL 31  29  44   Creatinine 0.76 - 1.27 mg/dL 2.26  2.11  2.48   Sodium 134 - 144 mmol/L 138  136  139   Potassium 3.5 - 5.2 mmol/L 4.5  4.4  5.2   Chloride 96 - 106 mmol/L 101  100  103   CO2 20 - 29 mmol/L 20  22  20    Calcium 8.6 - 10.2 mg/dL 10.3  9.6  10.6   Total Protein 6.0 - 8.5 g/dL 7.4  7.3  7.6   Total Bilirubin 0.0 - 1.2 mg/dL 0.3  0.4  <0.2   Alkaline Phos 44 - 121 IU/L 141  92  82   AST 0 - 40 IU/L 21  29  20    ALT 0 - 44 IU/L 25  23  17      Lipid Panel     Component Value Date/Time   CHOL 158 01/17/2022 0929   TRIG 67 01/17/2022 0929   HDL 57 01/17/2022 0929   CHOLHDL 3.0 11/16/2020 1042   CHOLHDL 2.8 02/29/2016 1018   VLDL 12 02/29/2016 1018   LDLCALC 88 01/17/2022 0929    CBC     Component Value Date/Time   WBC 6.1 07/18/2019 1153   WBC 7.6 05/05/2013 1116   RBC 4.73 07/18/2019 1153   RBC 4.82 05/05/2013 1116   HGB 12.6 (L) 07/18/2019 1153   HCT 37.6 07/18/2019 1153   PLT 368 07/18/2019 1153   MCV 80 07/18/2019 1153   MCH 26.6 07/18/2019 1153   MCH 28.0 05/05/2013 1116   MCHC 33.5 07/18/2019 1153   MCHC 35.7 05/05/2013 1116   RDW 17.0 (H) 07/18/2019 1153   LYMPHSABS 2.0 07/18/2019 1153   MONOABS 0.7 05/05/2013 1116   EOSABS 0.4 07/18/2019 1153   BASOSABS 0.1 07/18/2019 1153    Lab Results  Component Value Date   HGBA1C 10.2 (A) 07/25/2022    Assessment & Plan:  1. Hypertension associated with diabetes ([place]) Uncontrolled He did not take his antihypertensives this morning Home blood pressure log reveals blood pressures have been normal hence I will make no change today Advised to always take his blood pressure medication prior to his office visit Counseled on blood pressure goal of less than 130/80, low-sodium, DASH diet,  medication compliance, 150 minutes of moderate intensity exercise per week. Discussed medication compliance, adverse effects.  - amLODipine (NORVASC) 10 MG tablet; Take 1 tablet (10 mg total) by mouth daily.  Dispense: 90 tablet; Refill: 1  2. Hyperlipidemia associated with type 2 diabetes mellitus (HCC) Controlled Low-cholesterol diet - atorvastatin (LIPITOR) 40 MG tablet; Take 1 tablet (40 mg total) by mouth daily.  Dispense: 90 tablet; Refill: 1  3. Type 2 diabetes mellitus with diabetic polyneuropathy, with long-term current use of insulin ([place]) Uncontrolled but blood sugar log reveals improvement No regimen change today This note has been created with Surveyor, quantity. Any transcriptional errors are unintentional.   - Microalbumin/Creatinine Ratio, Urine - CMP14+EGFR - glipiZIDE (GLUCOTROL) 5 MG tablet; Take 1 tablet (5 mg total) by mouth 2 (two) times daily before a meal.   Dispense: 180 tablet; Refill: 1    Meds ordered this encounter  Medications   amLODipine (NORVASC) 10 MG tablet    Sig: Take 1 tablet (10 mg total) by mouth daily.    Dispense:  90 tablet    Refill:  1   atorvastatin (LIPITOR) 40 MG tablet    Sig: Take 1 tablet (40 mg total) by mouth daily.    Dispense:  90 tablet    Refill:  1   glipiZIDE (GLUCOTROL) 5 MG tablet    Sig: Take 1 tablet (5 mg total) by mouth 2 (two) times daily before a meal.    Dispense:  180 tablet    Refill:  1    Follow-up: Return in about 2 months (around 11/26/2022) for Chronic medical conditions.       Charlott Rakes, MD, FAAFP. [facility] and [place], [place]   09/26/2022, 12:12 PM

## 2022-09-27 LAB — CMP14+EGFR
ALT: 26 IU/L (ref 0–44)
AST: 23 IU/L (ref 0–40)
Albumin/Globulin Ratio: 1.7 (ref 1.2–2.2)
Albumin: 4.6 g/dL (ref 3.8–4.8)
Alkaline Phosphatase: 100 IU/L (ref 44–121)
BUN/Creatinine Ratio: 13 (ref 10–24)
BUN: 29 mg/dL — ABNORMAL HIGH (ref 8–27)
Bilirubin Total: 0.5 mg/dL (ref 0.0–1.2)
CO2: 17 mmol/L — ABNORMAL LOW (ref 20–29)
Calcium: 9.8 mg/dL (ref 8.6–10.2)
Chloride: 104 mmol/L (ref 96–106)
Creatinine, Ser: 2.15 mg/dL — ABNORMAL HIGH (ref 0.76–1.27)
Globulin, Total: 2.7 g/dL (ref 1.5–4.5)
Glucose: 109 mg/dL — ABNORMAL HIGH (ref 70–99)
Potassium: 4.8 mmol/L (ref 3.5–5.2)
Sodium: 139 mmol/L (ref 134–144)
Total Protein: 7.3 g/dL (ref 6.0–8.5)
eGFR: 32 mL/min/{1.73_m2} — ABNORMAL LOW (ref >59)

## 2022-09-27 LAB — MICROALBUMIN / CREATININE URINE RATIO
Creatinine, Urine: 88.5 mg/dL
Microalb/Creat Ratio: 41 mg/g creat — ABNORMAL HIGH (ref 0–29)
Microalbumin, Urine: 36.4 ug/mL

## 2022-09-28 ENCOUNTER — Other Ambulatory Visit: Payer: Self-pay

## 2022-09-28 MED ORDER — TRAMADOL HCL 50 MG PO TABS
50.0000 mg | ORAL_TABLET | Freq: Every evening | ORAL | 1 refills | Status: DC | PRN
  Filled 2022-09-28: qty 30, 30d supply, fill #0
  Filled 2022-11-06: qty 30, 30d supply, fill #1

## 2022-09-29 ENCOUNTER — Other Ambulatory Visit: Payer: Self-pay

## 2022-09-29 ENCOUNTER — Telehealth: Payer: Self-pay | Admitting: Emergency Medicine

## 2022-09-29 NOTE — Telephone Encounter (Signed)
Patient has been notified of lab results  

## 2022-09-29 NOTE — Telephone Encounter (Signed)
Copied from [place] [id number]. Topic: General - Call Back - No Documentation >> Sep 29, 2022 12:43 PM Ja-Kwan M wrote: Reason for CRM: Pt stated he had a message asking him to return call to [place]. Cb# [phone]

## 2022-10-09 ENCOUNTER — Other Ambulatory Visit: Payer: Self-pay

## 2022-10-10 ENCOUNTER — Other Ambulatory Visit: Payer: Self-pay

## 2022-10-16 DIAGNOSIS — I129 Hypertensive chronic kidney disease with stage 1 through stage 4 chronic kidney disease, or unspecified chronic kidney disease: Secondary | ICD-10-CM | POA: Diagnosis not present

## 2022-10-16 DIAGNOSIS — E559 Vitamin D deficiency, unspecified: Secondary | ICD-10-CM | POA: Diagnosis not present

## 2022-10-16 DIAGNOSIS — N1832 Chronic kidney disease, stage 3b: Secondary | ICD-10-CM | POA: Diagnosis not present

## 2022-10-16 DIAGNOSIS — N281 Cyst of kidney, acquired: Secondary | ICD-10-CM | POA: Diagnosis not present

## 2022-10-16 DIAGNOSIS — E1122 Type 2 diabetes mellitus with diabetic chronic kidney disease: Secondary | ICD-10-CM | POA: Diagnosis not present

## 2022-10-23 ENCOUNTER — Other Ambulatory Visit: Payer: Self-pay

## 2022-10-24 ENCOUNTER — Telehealth: Payer: Self-pay | Admitting: Pharmacist

## 2022-10-24 NOTE — Progress Notes (Signed)
Patient attempted to be outreached by Britney Brown, PharmD Candidate on 10/24/22 to discuss hypertension. Left voicemail for patient to return our call at their convenience at [phone].   Britney Brown, PharmD Candidate   Catie T. Seabron Iannello, PharmD, BCACP, CPP [facility] [phone]  

## 2022-11-20 ENCOUNTER — Other Ambulatory Visit: Payer: Self-pay

## 2022-11-23 ENCOUNTER — Telehealth: Payer: Self-pay

## 2022-11-27 ENCOUNTER — Ambulatory Visit: Payer: Medicare Other | Attending: Family Medicine | Admitting: Family Medicine

## 2022-11-27 ENCOUNTER — Other Ambulatory Visit: Payer: Self-pay

## 2022-11-27 ENCOUNTER — Encounter: Payer: Self-pay | Admitting: Family Medicine

## 2022-11-27 VITALS — BP 153/78 | HR 82 | Temp 98.8°F | Ht 64.0 in | Wt 130.6 lb

## 2022-11-27 DIAGNOSIS — E1122 Type 2 diabetes mellitus with diabetic chronic kidney disease: Secondary | ICD-10-CM | POA: Diagnosis not present

## 2022-11-27 DIAGNOSIS — E1142 Type 2 diabetes mellitus with diabetic polyneuropathy: Secondary | ICD-10-CM | POA: Diagnosis not present

## 2022-11-27 DIAGNOSIS — I129 Hypertensive chronic kidney disease with stage 1 through stage 4 chronic kidney disease, or unspecified chronic kidney disease: Secondary | ICD-10-CM

## 2022-11-27 DIAGNOSIS — N1832 Chronic kidney disease, stage 3b: Secondary | ICD-10-CM | POA: Diagnosis not present

## 2022-11-27 DIAGNOSIS — K219 Gastro-esophageal reflux disease without esophagitis: Secondary | ICD-10-CM

## 2022-11-27 DIAGNOSIS — Z794 Long term (current) use of insulin: Secondary | ICD-10-CM

## 2022-11-27 LAB — POCT GLYCOSYLATED HEMOGLOBIN (HGB A1C): HbA1c, POC (controlled diabetic range): 10.5 % — AB (ref 0.0–7.0)

## 2022-11-27 MED ORDER — PANTOPRAZOLE SODIUM 40 MG PO TBEC
40.0000 mg | DELAYED_RELEASE_TABLET | Freq: Every day | ORAL | 1 refills | Status: DC
  Filled 2022-11-27 – 2023-01-08 (×2): qty 90, 90d supply, fill #0
  Filled 2023-09-24: qty 90, 90d supply, fill #1

## 2022-11-27 MED ORDER — CARVEDILOL 3.125 MG PO TABS
3.1250 mg | ORAL_TABLET | Freq: Two times a day (BID) | ORAL | 1 refills | Status: DC
  Filled 2022-11-27 – 2022-12-14 (×2): qty 180, 90d supply, fill #0

## 2022-11-27 NOTE — Progress Notes (Signed)
Subjective:  Patient ID: Jerry Serrano, male    DOB: 03/31/1951  Age: 72 y.o. MRN: [id number]  CC: Diabetes   HPI Jerry Serrano is a 72 y.o. year old male with a history of hypertension, hyperlipidemia, type 2 diabetes mellitus (A1c 10.2), stage III CKD who presents today for a  follow-up visit.      Interval History:  BP at home was 108/66 this morning and yesterday was 124/83. Blood pressure in the clinic is elevated and was elevated at his last visit.  I have used his blood pressure monitor to check his blood pressure and it measures 138/77. For his CKD he is currently under the care of Nephrology and has been placed on OTC vitamin D.  A1c is 10.5 up from 10.2 previously and he endorses adherence with glipizide and Lantus.  On further questioning he informs me that on administering his insulin he gets to a point where insulin no longer goes into his skin and he has to take it out and readminister again. Lowest sugar has been 90 but he has had some high sugars. Reflux symptoms are intermittent and controlled on his PPI.  Past Medical History:  Diagnosis Date   Allergy    Arthritis    Diabetes mellitus    Hyperlipidemia    Hypertension     Past Surgical History:  Procedure Laterality Date   HERNIA REPAIR      Family History  Problem Relation Age of Onset   Colon cancer Neg Hx    Esophageal cancer Neg Hx    Rectal cancer Neg Hx    Stomach cancer Neg Hx     Social History   Socioeconomic History   Marital status: Married    Spouse name: Not on file   Number of children: Not on file   Years of education: Not on file   Highest education level: Not on file  Occupational History   Not on file  Tobacco Use   Smoking status: Former    Types: Cigarettes   Smokeless tobacco: Never   Tobacco comments:    over 30 years ago  Vaping Use   Vaping Use: Never used  Substance and Sexual Activity   Alcohol use: No   Drug use: No   Sexual activity: Not on file   Other Topics Concern   Not on file  Social History Narrative   Not on file   Social Determinants of Health   Financial Resource Strain: Low Risk  (05/12/2022)   Overall Financial Resource Strain (CARDIA)    Difficulty of Paying Living Expenses: Not hard at all  Food Insecurity: No Food Insecurity (09/26/2022)   Hunger Vital Sign    Worried About Running Out of Food in the Last Year: Never true    Ran Out of Food in the Last Year: Never true  Transportation Needs: No Transportation Needs (09/26/2022)   PRAPARE - Administrator, Civil Service (Medical): No    Lack of Transportation (Non-Medical): No  Physical Activity: Insufficiently Active (05/12/2022)   Exercise Vital Sign    Days of Exercise per Week: 4 days    Minutes of Exercise per Session: 30 min  Stress: No Stress Concern Present (05/12/2022)   [employer] of Occupational Health - Occupational Stress Questionnaire    Feeling of Stress : Not at all  Social Connections: Moderately Integrated (05/12/2022)   Social Connection and Isolation Panel [NHANES]    Frequency of Communication with Friends and  Family: More than three times a week    Frequency of Social Gatherings with Friends and Family: Patient declined    Attends Religious Services: More than 4 times per year    Active Member of [employer] or Organizations: Yes    Attends Banker Meetings: More than 4 times per year    Marital Status: Widowed    No Known Allergies  Outpatient Medications Prior to Visit  Medication Sig Dispense Refill   amLODipine (NORVASC) 10 MG tablet Take 1 tablet (10 mg total) by mouth daily. 90 tablet 1   aspirin EC 81 MG tablet Take 1 tablet (81 mg total) by mouth daily. 30 tablet 3   atorvastatin (LIPITOR) 40 MG tablet Take 1 tablet (40 mg total) by mouth daily. 90 tablet 1   betamethasone dipropionate 0.05 % cream APPLY EXTERNALLY TO THE AFFECTED AREA TWICE DAILY 30 g 0   Blood Glucose Monitoring Suppl (ACCU-CHEK AVIVA  PLUS) w/Device KIT USE AS DIRECTED 4 TIMES DAILY AFTER MEALS AND AT BEDTIME. 1 kit 0   gabapentin (NEURONTIN) 300 MG capsule Take 1 capsule (300 mg total) by mouth daily. 90 capsule 1   glipiZIDE (GLUCOTROL) 5 MG tablet Take 1 tablet (5 mg total) by mouth 2 (two) times daily before a meal. 180 tablet 1   glucose blood (ACCU-CHEK AVIVA PLUS) test strip USE 1 STRIP TO CHECK GLUCOSE THREE TIMES DAILY FOR BLOOD SUGAR --MUST  HAVE  OFFICE  VISIT  FOR  REFILLS 300 each 2   insulin glargine (LANTUS) 100 UNIT/ML Solostar Pen Inject 35 Units into the skin daily. 30 mL 6   ketoconazole (NIZORAL) 2 % cream Apply 1 application topically daily. 15 g 0   Lancet Devices (ACCU-CHEK SOFTCLIX) lancets Use as instructed 1 each 12   MICROLET LANCETS MISC Test 3 times per day 100 each 12   olopatadine (PATANOL) 0.1 % ophthalmic solution Place 1 drop into both eyes 2 (two) times daily.     sildenafil (VIAGRA) 50 MG tablet Take 1 tablet (50 mg total) by mouth daily as needed for erectile dysfunction. 10 tablet 1   traMADol (ULTRAM) 50 MG tablet Take 1 tablet (50 mg total) by mouth at bedtime as needed. 30 tablet 1   valsartan-hydrochlorothiazide (DIOVAN-HCT) 320-25 MG tablet Take 1 tablet by mouth daily. 90 tablet 1   carvedilol (COREG) 3.125 MG tablet Take 1 tablet (3.125 mg total) by mouth 2 (two) times daily with a meal. 60 tablet 3   pantoprazole (PROTONIX) 40 MG tablet Take 1 tablet (40 mg total) by mouth daily. 30 tablet 2   pantoprazole (PROTONIX) 40 MG tablet TAKE 1 TABLET BY MOUTH DAILY. 90 tablet 1   Facility-Administered Medications Prior to Visit  Medication Dose Route Frequency Provider Last Rate Last Admin   0.9 %  sodium chloride infusion  500 mL Intravenous Continuous Danis, [place]arr [place] III, MD         ROS Review of Systems  Constitutional:  Negative for activity change and appetite change.  HENT:  Negative for sinus pressure and sore throat.   Respiratory:  Negative for chest tightness, shortness of  breath and wheezing.   Cardiovascular:  Negative for chest pain and palpitations.  Gastrointestinal:  Negative for abdominal distention, abdominal pain and constipation.  Genitourinary: Negative.   Musculoskeletal: Negative.   Psychiatric/Behavioral:  Negative for behavioral problems and dysphoric mood.    Objective:  BP (!) 153/78   Pulse 82   Temp 98.8 F (37.1 C) (  Oral)   Ht 5\' 4"  (1.626 m)   Wt 130 lb 9.6 oz (59.2 kg)   SpO2 99%   BMI 22.42 kg/m      11/27/2022   11:01 AM 11/27/2022   10:25 AM 09/26/2022   10:00 AM  BP/Weight  Systolic BP 153 154 159  Diastolic BP 78 82 77  Wt. (Lbs)  130.6   BMI  22.42 kg/m2       Physical Exam Constitutional:      Appearance: He is well-developed.  Cardiovascular:     Rate and Rhythm: Normal rate.     Heart sounds: Normal heart sounds. No murmur heard. Pulmonary:     Effort: Pulmonary effort is normal.     Breath sounds: Normal breath sounds. No wheezing or rales.  Chest:     Chest wall: No tenderness.  Abdominal:     General: Bowel sounds are normal. There is no distension.     Palpations: Abdomen is soft. There is no mass.     Tenderness: There is no abdominal tenderness.  Musculoskeletal:        General: Normal range of motion.     Right lower leg: No edema.     Left lower leg: No edema.  Neurological:     Mental Status: He is alert and oriented to person, place, and time.  Psychiatric:        Mood and Affect: Mood normal.    Diabetic Foot Exam - Simple   Simple Foot Form Diabetic Foot exam was performed with the following findings: Yes 11/27/2022 10:50 AM  Visual Inspection No deformities, no ulcerations, no other skin breakdown bilaterally: Yes Sensation Testing Intact to touch and monofilament testing bilaterally: Yes Pulse Check Posterior Tibialis and Dorsalis pulse intact bilaterally: Yes Comments        Latest Ref Rng & Units 09/26/2022   10:27 AM 04/25/2022    9:23 AM 01/31/2022    8:49 AM  CMP   Glucose 70 - 99 mg/dL 409  811  914   BUN 8 - 27 mg/dL 29  31  29    Creatinine 0.76 - 1.27 mg/dL 7.82  9.56  2.13   Sodium 134 - 144 mmol/L 139  138  136   Potassium 3.5 - 5.2 mmol/L 4.8  4.5  4.4   Chloride 96 - 106 mmol/L 104  101  100   CO2 20 - 29 mmol/L 17  20  22    Calcium 8.6 - 10.2 mg/dL 9.8  08.6  9.6   Total Protein 6.0 - 8.5 g/dL 7.3  7.4  7.3   Total Bilirubin 0.0 - 1.2 mg/dL 0.5  0.3  0.4   Alkaline Phos 44 - 121 IU/L 100  141  92   AST 0 - 40 IU/L 23  21  29    ALT 0 - 44 IU/L 26  25  23      Lipid Panel     Component Value Date/Time   CHOL 158 01/17/2022 0929   TRIG 67 01/17/2022 0929   HDL 57 01/17/2022 0929   CHOLHDL 3.0 11/16/2020 1042   CHOLHDL 2.8 02/29/2016 1018   VLDL 12 02/29/2016 1018   LDLCALC 88 01/17/2022 0929    CBC    Component Value Date/Time   WBC 6.1 07/18/2019 1153   WBC 7.6 05/05/2013 1116   RBC 4.73 07/18/2019 1153   RBC 4.82 05/05/2013 1116   HGB 12.6 (L) 07/18/2019 1153   HCT 37.6 07/18/2019 1153  PLT 368 07/18/2019 1153   MCV 80 07/18/2019 1153   MCH 26.6 07/18/2019 1153   MCH 28.0 05/05/2013 1116   MCHC 33.5 07/18/2019 1153   MCHC 35.7 05/05/2013 1116   RDW 17.0 (H) 07/18/2019 1153   LYMPHSABS 2.0 07/18/2019 1153   MONOABS 0.7 05/05/2013 1116   EOSABS 0.4 07/18/2019 1153   BASOSABS 0.1 07/18/2019 1153    Lab Results  Component Value Date   HGBA1C 10.5 (A) 11/27/2022    Assessment & Plan:  1. Type 2 diabetes mellitus with diabetic polyneuropathy, with long-term current use of insulin (HCC) Uncontrolled with A1c of 10.5, goal is less than 7.0 Clinical pharmacist called in to demonstrate appropriate administration technique of insulin and we have been able to verify that he has been using an improper technique leading to wasting of Lantus and poor glycemic control Will continue with 35 units of Lantus with no regimen change and he will continue his glipizide Counseled on Diabetic diet, my plate method, 409 minutes of  moderate intensity exercise/week Blood sugar logs with fasting goals of 80-120 mg/dl, random of less than 811 and in the event of sugars less than 60 mg/dl or greater than 914 mg/dl encouraged to notify the clinic. Advised on the need for annual eye exams, annual foot exams, Pneumonia vaccine. - POCT glycosylated hemoglobin (Hb A1C)  2. Gastroesophageal reflux disease, unspecified whether esophagitis present Controlled Advised to avoid recumbency up to 2 hours postmeal, avoid late meals, avoid foods that trigger symptoms. - pantoprazole (PROTONIX) 40 MG tablet; Take 1 tablet (40 mg total) by mouth daily.  Dispense: 90 tablet; Refill: 1  3. 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 I will make no regimen changes today and he will continue with his current regimen - carvedilol (COREG) 3.125 MG tablet; Take 1 tablet (3.125 mg total) by mouth 2 (two) times daily with a meal.  Dispense: 180 tablet; Refill: 1    Meds ordered this encounter  Medications   carvedilol (COREG) 3.125 MG tablet    Sig: Take 1 tablet (3.125 mg total) by mouth 2 (two) times daily with a meal.    Dispense:  180 tablet    Refill:  1   pantoprazole (PROTONIX) 40 MG tablet    Sig: Take 1 tablet (40 mg total) by mouth daily.    Dispense:  90 tablet    Refill:  1    Follow-up: Return in about 3 months (around 02/27/2023) for Chronic medical conditions.       Hoy Register, MD, FAAFP. [facility] [place], [place] [phone]   11/27/2022, 12:56 PM

## 2022-11-27 NOTE — Patient Instructions (Signed)

## 2022-12-05 ENCOUNTER — Other Ambulatory Visit: Payer: Self-pay

## 2022-12-14 ENCOUNTER — Other Ambulatory Visit: Payer: Self-pay

## 2022-12-19 DIAGNOSIS — E875 Hyperkalemia: Secondary | ICD-10-CM | POA: Diagnosis not present

## 2022-12-23 ENCOUNTER — Other Ambulatory Visit: Payer: Self-pay | Admitting: Family Medicine

## 2022-12-23 DIAGNOSIS — M19249 Secondary osteoarthritis, unspecified hand: Secondary | ICD-10-CM

## 2022-12-25 ENCOUNTER — Other Ambulatory Visit: Payer: Self-pay

## 2022-12-25 NOTE — Telephone Encounter (Signed)
Requested medication (s) are due for refill today: Yes  Requested medication (s) are on the active medication list: Yes  Last refill:  3/21.24  Future visit scheduled: Yes  Notes to clinic:  Not delegated.     Requested Prescriptions  Pending Prescriptions Disp Refills   traMADol (ULTRAM) 50 MG tablet 30 tablet 1    Sig: Take 1 tablet (50 mg total) by mouth at bedtime as needed.     Not Delegated - Analgesics:  Opioid Agonists Failed - 12/23/2022 12:12 AM      Failed - This refill cannot be delegated      Failed - Urine Drug Screen completed in last 360 days      Passed - Valid encounter within last 3 months    Recent Outpatient Visits           4 weeks ago Type 2 diabetes mellitus with diabetic polyneuropathy, with long-term current use of insulin (HCC)   [place] [facility] [place], Odette Horns, MD   3 months ago Hypertension associated with diabetes [facility])   [place] [facility] [place], [place], MD   5 months ago Type 2 diabetes mellitus with diabetic polyneuropathy, with long-term current use of insulin (HCC)   [place] [facility] [place], [place], MD   6 months ago Type 2 diabetes mellitus with diabetic polyneuropathy, with long-term current use of insulin (HCC)   [place] [facility] [place], [place], MD   8 months ago Type 2 diabetes mellitus with diabetic polyneuropathy, with long-term current use of insulin [facility])   [place] [facility] Hoy Register, MD       Future Appointments             In 2 months Hoy Register, MD [facility]

## 2022-12-26 ENCOUNTER — Other Ambulatory Visit: Payer: Self-pay

## 2022-12-26 MED ORDER — TRAMADOL HCL 50 MG PO TABS
50.0000 mg | ORAL_TABLET | Freq: Every evening | ORAL | 1 refills | Status: DC | PRN
Start: 2022-12-26 — End: 2023-03-13
  Filled 2022-12-26: qty 30, 30d supply, fill #0
  Filled 2023-02-02: qty 30, 30d supply, fill #1

## 2022-12-27 ENCOUNTER — Other Ambulatory Visit: Payer: Self-pay

## 2023-01-08 ENCOUNTER — Other Ambulatory Visit: Payer: Self-pay

## 2023-01-23 NOTE — Patient Instructions (Incomplete)
Jerry Serrano , Thank you for taking time to come for your Medicare Wellness Visit. I appreciate your ongoing commitment to your health goals. Please review the following plan we discussed and let me know if I can assist you in the future.   These are the goals we discussed:  Goals      Blood Pressure < 140/90     HEMOGLOBIN A1C < 7.0        This is a list of the screening recommended for you and due dates:  Health Maintenance  Topic Date Due   Zoster (Shingles) Vaccine (1 of 2) Never done   COVID-19 Vaccine (3 - 2023-24 season) 03/10/2022   Flu Shot  02/08/2023   Eye exam for diabetics  04/11/2023   Medicare Annual Wellness Visit  05/13/2023   Hemoglobin A1C  05/30/2023   Yearly kidney function blood test for diabetes  09/26/2023   Yearly kidney health urinalysis for diabetes  09/26/2023   Complete foot exam   11/27/2023   Colon Cancer Screening  11/02/2026   DTaP/Tdap/Td vaccine (2 - Td or Tdap) 02/22/2028   Pneumonia Vaccine  Completed   Hepatitis C Screening  Completed   HPV Vaccine  Aged Out    Advanced directives: Information on Advanced Care Planning can be found at [facility] Advance Health Care Directives Advance Health Care Directives ([email or web address] Please bring a copy of your health care power of attorney and living will to the office to be added to your chart at your convenience.  Conditions/risks identified: Aim for 30 minutes of exercise or brisk walking, 6-8 glasses of water, and 5 servings of fruits and vegetables each day.  Next appointment: Follow up in one year for your annual wellness visit.   Preventive Care 75 Years and Older, Male  Preventive care refers to lifestyle choices and visits with your health care provider that can promote health and wellness. What does preventive care include? A yearly physical exam. This is also called an annual well check. Dental exams once or twice a year. Routine eye exams. Ask your health care provider  how often you should have your eyes checked. Personal lifestyle choices, including: Daily care of your teeth and gums. Regular physical activity. Eating a healthy diet. Avoiding tobacco and drug use. Limiting alcohol use. Practicing safe sex. Taking low doses of aspirin every day. Taking vitamin and mineral supplements as recommended by your health care provider. What happens during an annual well check? The services and screenings done by your health care provider during your annual well check will depend on your age, overall health, lifestyle risk factors, and family history of disease. Counseling  Your health care provider may ask you questions about your: Alcohol use. Tobacco use. Drug use. Emotional well-being. Home and relationship well-being. Sexual activity. Eating habits. History of falls. Memory and ability to understand (cognition). Work and work Astronomer. Screening  You may have the following tests or measurements: Height, weight, and BMI. Blood pressure. Lipid and cholesterol levels. These may be checked every 5 years, or more frequently if you are over 80 years old. Skin check. Lung cancer screening. You may have this screening every year starting at age 74 if you have a 30-pack-year history of smoking and currently smoke or have quit within the past 15 years. Fecal occult blood test (FOBT) of the stool. You may have this test every year starting at age 109. Flexible sigmoidoscopy or colonoscopy. You may have a sigmoidoscopy every 5  years or a colonoscopy every 10 years starting at age 78. Prostate cancer screening. Recommendations will vary depending on your family history and other risks. Hepatitis C blood test. Hepatitis B blood test. Sexually transmitted disease (STD) testing. Diabetes screening. This is done by checking your blood sugar (glucose) after you have not eaten for a while (fasting). You may have this done every 1-3 years. Abdominal aortic aneurysm  (AAA) screening. You may need this if you are a current or former smoker. Osteoporosis. You may be screened starting at age 1 if you are at high risk. Talk with your health care provider about your test results, treatment options, and if necessary, the need for more tests. Vaccines  Your health care provider may recommend certain vaccines, such as: Influenza vaccine. This is recommended every year. Tetanus, diphtheria, and acellular pertussis (Tdap, Td) vaccine. You may need a Td booster every 10 years. Zoster vaccine. You may need this after age 25. Pneumococcal 13-valent conjugate (PCV13) vaccine. One dose is recommended after age 53. Pneumococcal polysaccharide (PPSV23) vaccine. One dose is recommended after age 45. Talk to your health care provider about which screenings and vaccines you need and how often you need them. This information is not intended to replace advice given to you by your health care provider. Make sure you discuss any questions you have with your health care provider. Document Released: 07/23/2015 Document Revised: 03/15/2016 Document Reviewed: 04/27/2015 Elsevier Interactive Patient Education  2017 [employer].  Fall Prevention in the Home Falls can cause injuries. They can happen to people of all ages. There are many things you can do to make your home safe and to help prevent falls. What can I do on the outside of my home? Regularly fix the edges of walkways and driveways and fix any cracks. Remove anything that might make you trip as you walk through a door, such as a raised step or threshold. Trim any bushes or trees on the path to your home. Use bright outdoor lighting. Clear any walking paths of anything that might make someone trip, such as rocks or tools. Regularly check to see if handrails are loose or broken. Make sure that both sides of any steps have handrails. Any raised decks and porches should have guardrails on the edges. Have any leaves, snow, or  ice cleared regularly. Use sand or salt on walking paths during winter. Clean up any spills in your garage right away. This includes oil or grease spills. What can I do in the bathroom? Use night lights. Install grab bars by the toilet and in the tub and shower. Do not use towel bars as grab bars. Use non-skid mats or decals in the tub or shower. If you need to sit down in the shower, use a plastic, non-slip stool. Keep the floor dry. Clean up any water that spills on the floor as soon as it happens. Remove soap buildup in the tub or shower regularly. Attach bath mats securely with double-sided non-slip rug tape. Do not have throw rugs and other things on the floor that can make you trip. What can I do in the bedroom? Use night lights. Make sure that you have a light by your bed that is easy to reach. Do not use any sheets or blankets that are too big for your bed. They should not hang down onto the floor. Have a firm chair that has side arms. You can use this for support while you get dressed. Do not have throw rugs  and other things on the floor that can make you trip. What can I do in the kitchen? Clean up any spills right away. Avoid walking on wet floors. Keep items that you use a lot in easy-to-reach places. If you need to reach something above you, use a strong step stool that has a grab bar. Keep electrical cords out of the way. Do not use floor polish or wax that makes floors slippery. If you must use wax, use non-skid floor wax. Do not have throw rugs and other things on the floor that can make you trip. What can I do with my stairs? Do not leave any items on the stairs. Make sure that there are handrails on both sides of the stairs and use them. Fix handrails that are broken or loose. Make sure that handrails are as long as the stairways. Check any carpeting to make sure that it is firmly attached to the stairs. Fix any carpet that is loose or worn. Avoid having throw rugs at  the top or bottom of the stairs. If you do have throw rugs, attach them to the floor with carpet tape. Make sure that you have a light switch at the top of the stairs and the bottom of the stairs. If you do not have them, ask someone to add them for you. What else can I do to help prevent falls? Wear shoes that: Do not have high heels. Have rubber bottoms. Are comfortable and fit you well. Are closed at the toe. Do not wear sandals. If you use a stepladder: Make sure that it is fully opened. Do not climb a closed stepladder. Make sure that both sides of the stepladder are locked into place. Ask someone to hold it for you, if possible. Clearly mark and make sure that you can see: Any grab bars or handrails. First and last steps. Where the edge of each step is. Use tools that help you move around (mobility aids) if they are needed. These include: Canes. Walkers. Scooters. Crutches. Turn on the lights when you go into a dark area. Replace any light bulbs as soon as they burn out. Set up your furniture so you have a clear path. Avoid moving your furniture around. If any of your floors are uneven, fix them. If there are any pets around you, be aware of where they are. Review your medicines with your doctor. Some medicines can make you feel dizzy. This can increase your chance of falling. Ask your doctor what other things that you can do to help prevent falls. This information is not intended to replace advice given to you by your health care provider. Make sure you discuss any questions you have with your health care provider. Document Released: 04/22/2009 Document Revised: 12/02/2015 Document Reviewed: 07/31/2014 Elsevier Interactive Patient Education  2017 [employer].

## 2023-01-24 ENCOUNTER — Ambulatory Visit: Payer: Medicare Other | Attending: Family Medicine

## 2023-01-24 VITALS — Ht 64.0 in | Wt 130.0 lb

## 2023-01-24 DIAGNOSIS — Z0000 Encounter for general adult medical examination without abnormal findings: Secondary | ICD-10-CM | POA: Diagnosis not present

## 2023-01-24 NOTE — Progress Notes (Signed)
Subjective:   Jerry Serrano is a 72 y.o. male who presents for Medicare Annual/Subsequent preventive examination.  Visit Complete: Virtual  I connected with  Jerry Serrano on 01/24/23 by a audio enabled telemedicine application and verified that I am speaking with the correct person using two identifiers.  Patient Location: Home  Provider Location: Home Office  I discussed the limitations of evaluation and management by telemedicine. The patient expressed understanding and agreed to proceed.  Review of Systems     Cardiac Risk Factors include: advanced age (>15men, >64 women);diabetes mellitus;hypertension;male gender;dyslipidemia  Per patient no change in vitals since last visit, unable to obtain new vitals due to telehealth visit     Objective:    Today's Vitals   01/24/23 1459  Weight: 130 lb (59 kg)  Height: 5\' 4"  (1.626 m)   Body mass index is 22.31 kg/m.     01/24/2023    3:01 PM 05/12/2022   11:39 AM 03/25/2021   10:13 AM 08/11/2020   11:03 AM 02/06/2019    1:22 PM 01/07/2019    3:25 PM 06/04/2018    8:52 AM  Advanced Directives  Does Patient Have a Medical Advance Directive? No No No No No No No  Would patient like information on creating a medical advance directive? Yes (MAU/Ambulatory/Procedural Areas - Information given) Yes (ED - Information included in AVS) No - Patient declined No - Patient declined Yes (ED - Information included in AVS) No - Patient declined No - Patient declined    Current Medications (verified) Outpatient Encounter Medications as of 01/24/2023  Medication Sig   amLODipine (NORVASC) 10 MG tablet Take 1 tablet (10 mg total) by mouth daily.   aspirin EC 81 MG tablet Take 1 tablet (81 mg total) by mouth daily.   atorvastatin (LIPITOR) 40 MG tablet Take 1 tablet (40 mg total) by mouth daily.   betamethasone dipropionate 0.05 % cream APPLY EXTERNALLY TO THE AFFECTED AREA TWICE DAILY   Blood Glucose Monitoring Suppl (ACCU-CHEK AVIVA PLUS)  w/Device KIT USE AS DIRECTED 4 TIMES DAILY AFTER MEALS AND AT BEDTIME.   carvedilol (COREG) 3.125 MG tablet Take 1 tablet (3.125 mg total) by mouth 2 (two) times daily with a meal.   gabapentin (NEURONTIN) 300 MG capsule Take 1 capsule (300 mg total) by mouth daily.   glipiZIDE (GLUCOTROL) 5 MG tablet Take 1 tablet (5 mg total) by mouth 2 (two) times daily before a meal.   glucose blood (ACCU-CHEK AVIVA PLUS) test strip USE 1 STRIP TO CHECK GLUCOSE THREE TIMES DAILY FOR BLOOD SUGAR --MUST  HAVE  OFFICE  VISIT  FOR  REFILLS   insulin glargine (LANTUS) 100 UNIT/ML Solostar Pen Inject 35 Units into the skin daily.   ketoconazole (NIZORAL) 2 % cream Apply 1 application topically daily.   Lancet Devices (ACCU-CHEK SOFTCLIX) lancets Use as instructed   MICROLET LANCETS MISC Test 3 times per day   olopatadine (PATANOL) 0.1 % ophthalmic solution Place 1 drop into both eyes 2 (two) times daily.   pantoprazole (PROTONIX) 40 MG tablet Take 1 tablet (40 mg total) by mouth daily.   sildenafil (VIAGRA) 50 MG tablet Take 1 tablet (50 mg total) by mouth daily as needed for erectile dysfunction.   traMADol (ULTRAM) 50 MG tablet Take 1 tablet (50 mg total) by mouth at bedtime as needed.   valsartan-hydrochlorothiazide (DIOVAN-HCT) 320-25 MG tablet Take 1 tablet by mouth daily.   [DISCONTINUED] pantoprazole (PROTONIX) 40 MG tablet TAKE 1 TABLET BY  MOUTH DAILY.   Facility-Administered Encounter Medications as of 01/24/2023  Medication   0.9 %  sodium chloride infusion    Allergies (verified) Patient has no known allergies.   History: Past Medical History:  Diagnosis Date   Allergy    Arthritis    Diabetes mellitus    Hyperlipidemia    Hypertension    Past Surgical History:  Procedure Laterality Date   HERNIA REPAIR     Family History  Problem Relation Age of Onset   Colon cancer Neg Hx    Esophageal cancer Neg Hx    Rectal cancer Neg Hx    Stomach cancer Neg Hx    Social History    Socioeconomic History   Marital status: Married    Spouse name: Not on file   Number of children: Not on file   Years of education: Not on file   Highest education level: Not on file  Occupational History   Not on file  Tobacco Use   Smoking status: Former    Types: Cigarettes   Smokeless tobacco: Never   Tobacco comments:    over 30 years ago  Vaping Use   Vaping status: Never Used  Substance and Sexual Activity   Alcohol use: No   Drug use: No   Sexual activity: Not on file  Other Topics Concern   Not on file  Social History Narrative   Not on file   Social Determinants of Health   Financial Resource Strain: Low Risk  (01/24/2023)   Overall Financial Resource Strain (CARDIA)    Difficulty of Paying Living Expenses: Not hard at all  Food Insecurity: No Food Insecurity (01/24/2023)   Hunger Vital Sign    Worried About Running Out of Food in the Last Year: Never true    Ran Out of Food in the Last Year: Never true  Transportation Needs: No Transportation Needs (01/24/2023)   PRAPARE - Administrator, Civil Service (Medical): No    Lack of Transportation (Non-Medical): No  Physical Activity: Sufficiently Active (01/24/2023)   Exercise Vital Sign    Days of Exercise per Week: 5 days    Minutes of Exercise per Session: 30 min  Stress: No Stress Concern Present (01/24/2023)   [employer] of Occupational Health - Occupational Stress Questionnaire    Feeling of Stress : Not at all  Social Connections: Moderately Integrated (01/24/2023)   Social Connection and Isolation Panel [NHANES]    Frequency of Communication with Friends and Family: More than three times a week    Frequency of Social Gatherings with Friends and Family: Three times a week    Attends Religious Services: 1 to 4 times per year    Active Member of Clubs or Organizations: Yes    Attends Banker Meetings: More than 4 times per year    Marital Status: Widowed    Tobacco  Counseling Counseling given: Not Answered Tobacco comments: over 30 years ago   Clinical Intake:  Pre-visit preparation completed: Yes  Pain : No/denies pain     Diabetes: Yes CBG done?: No Did pt. bring in CBG monitor from home?: No  How often do you need to have someone help you when you read instructions, pamphlets, or other written materials from your doctor or pharmacy?: 1 - Never  Interpreter Needed?: No  Information entered by :: Kandis Fantasia LPN   Activities of Daily Living    01/24/2023    3:00 PM 05/12/2022   11:46  AM  In your present state of health, do you have any difficulty performing the following activities:  Hearing? 0 0  Vision? 0 0  Difficulty concentrating or making decisions? 0 0  Walking or climbing stairs? 0 0  Dressing or bathing? 0 0  Doing errands, shopping? 0 0  Preparing Food and eating ? N N  Using the Toilet? N N  In the past six months, have you accidently leaked urine? N N  Do you have problems with loss of bowel control? N N  Managing your Medications? N N  Managing your Finances? N N  Housekeeping or managing your Housekeeping? N N    Patient Care Team: Hoy Register, MD as PCP - General (Family Medicine) [facility] Loletta Parish., MD as Consulting Physician (Urology) Maxie Barb, MD as Consulting Physician (Nephrology)  Indicate any recent Medical Services you may have received from other than [facility] providers in the past year (date may be approximate).     Assessment:   This is a routine wellness examination for Latasha.  Hearing/Vision screen Hearing Screening - Comments:: Denies hearing difficulties   Vision Screening - Comments:: Wears rx glasses - up to date with routine eye exams with [facility]    Dietary issues and exercise activities discussed:     Goals Addressed             This Visit's Progress    Remain active and independent        Depression Screen     01/24/2023    3:11 PM 11/27/2022   10:26 AM 07/25/2022   11:13 AM 05/12/2022   11:40 AM 01/17/2022    9:17 AM 09/26/2021    2:20 PM 03/25/2021   10:08 AM  PHQ 2/9 Scores  PHQ - 2 Score 0 0 0 0 0 0 0  PHQ- 9 Score 0 0   0 0     Fall Risk    01/24/2023    3:02 PM 11/27/2022   10:26 AM 09/26/2022    9:42 AM 07/25/2022   11:12 AM 06/13/2022    9:29 AM  Fall Risk   Falls in the past year? 0 0 0  0  Number falls in past yr: 0 0 0 0 0  Injury with Fall? 0 0 0 0 0  Risk for fall due to : No Fall Risks No Fall Risks No Fall Risks No Fall Risks No Fall Risks  Follow up Falls prevention discussed;Education provided;Falls evaluation completed        MEDICARE RISK AT HOME:  Medicare Risk at Home - 01/24/23 1503     Any stairs in or around the home? No    If so, are there any without handrails? No    Home free of loose throw rugs in walkways, pet beds, electrical cords, etc? Yes    Adequate lighting in your home to reduce risk of falls? Yes    Life alert? No    Use of a cane, walker or w/c? No    Grab bars in the bathroom? Yes    Shower chair or bench in shower? No    Elevated toilet seat or a handicapped toilet? No             TIMED UP AND GO:  Was the test performed?  No    Cognitive Function:    05/12/2022   11:41 AM  MMSE - Mini Mental State Exam  Orientation to time  5  Orientation to Place 5  Registration 3  Attention/ Calculation 5  Recall 3  Language- name 2 objects 2  Language- repeat 1  Language- follow 3 step command 3  Language- read & follow direction 1  Write a sentence 1  Copy design 1  Total score 30        01/24/2023    3:03 PM 05/12/2022   11:40 AM 03/25/2021   10:17 AM  6CIT Screen  What Year? 0 points 0 points 0 points  What month? 0 points 0 points 0 points  What time? 0 points 0 points 0 points  Count back from 20 0 points 0 points 0 points  Months in reverse 0 points 0 points 0 points  Repeat phrase 0 points 0 points 0 points  Total Score 0  points 0 points 0 points    Immunizations Immunization History  Administered Date(s) Administered   Fluad Quad(high Dose 65+) 07/18/2019   Influenza Split 08/08/2012, 04/06/2015   Influenza,inj,Quad PF,6+ Mos 06/04/2018, 05/13/2020, 05/03/2022   Influenza-Unspecified 02/16/2016   PFIZER(Purple Top)SARS-COV-2 Vaccination 09/11/2019, 10/07/2019   Pneumococcal Conjugate-13 04/10/2017   Pneumococcal Polysaccharide-23 02/01/2016   Tdap 02/21/2018    TDAP status: Up to date  Pneumococcal vaccine status: Up to date  Covid-19 vaccine status: Information provided on how to obtain vaccines.   Qualifies for Shingles Vaccine? Yes   Zostavax completed No   Shingrix Completed?: No.    Education has been provided regarding the importance of this vaccine. Patient has been advised to call insurance company to determine out of pocket expense if they have not yet received this vaccine. Advised may also receive vaccine at local pharmacy or Health Dept. Verbalized acceptance and understanding.  Screening Tests Health Maintenance  Topic Date Due   Zoster Vaccines- Shingrix (1 of 2) Never done   COVID-19 Vaccine (3 - 2023-24 season) 03/10/2022   INFLUENZA VACCINE  02/08/2023   OPHTHALMOLOGY EXAM  04/11/2023   HEMOGLOBIN A1C  05/30/2023   Diabetic kidney evaluation - eGFR measurement  09/26/2023   Diabetic kidney evaluation - Urine ACR  09/26/2023   FOOT EXAM  11/27/2023   Medicare Annual Wellness (AWV)  01/24/2024   Colonoscopy  11/02/2026   DTaP/Tdap/Td (2 - Td or Tdap) 02/22/2028   Pneumonia Vaccine 21+ Years old  Completed   Hepatitis C Screening  Completed   HPV VACCINES  Aged Out    Health Maintenance  Health Maintenance Due  Topic Date Due   Zoster Vaccines- Shingrix (1 of 2) Never done   COVID-19 Vaccine (3 - 2023-24 season) 03/10/2022    Colorectal cancer screening: Type of screening: Colonoscopy. Completed 11/01/16. Repeat every 10 years  Lung Cancer Screening: (Low Dose CT  Chest recommended if Age 41-80 years, 20 pack-year currently smoking OR have quit w/in 15years.) does not qualify.   Lung Cancer Screening Referral: n/a  Additional Screening:  Hepatitis C Screening: does qualify; Completed 05/15/22  Vision Screening: Recommended annual ophthalmology exams for early detection of glaucoma and other disorders of the eye. Is the patient up to date with their annual eye exam?  Yes  Who is the provider or what is the name of the office in which the patient attends annual eye exams? [facility] If pt is not established with a provider, would they like to be referred to a provider to establish care? No .   Dental Screening: Recommended annual dental exams for proper oral hygiene  Diabetic Foot Exam: Diabetic Foot Exam:  Completed 11/27/22  Community Resource Referral / Chronic Care Management: CRR required this visit?  No   CCM required this visit?  No     Plan:     I have personally reviewed and noted the following in the patient's chart:   Medical and social history Use of alcohol, tobacco or illicit drugs  Current medications and supplements including opioid prescriptions. Patient is not currently taking opioid prescriptions. Functional ability and status Nutritional status Physical activity Advanced directives List of other physicians Hospitalizations, surgeries, and ER visits in previous 12 months Vitals Screenings to include cognitive, depression, and falls Referrals and appointments  In addition, I have reviewed and discussed with patient certain preventive protocols, quality metrics, and best practice recommendations. A written personalized care plan for preventive services as well as general preventive health recommendations were provided to patient.     Kandis Fantasia [place], [place]   10/16/8117   After Visit Summary: (MyChart) Due to this being a telephonic visit, the after visit summary with patients personalized plan was offered  to patient via MyChart   Nurse Notes: No concerns

## 2023-01-29 ENCOUNTER — Other Ambulatory Visit: Payer: Self-pay

## 2023-01-29 DIAGNOSIS — H40023 Open angle with borderline findings, high risk, bilateral: Secondary | ICD-10-CM | POA: Diagnosis not present

## 2023-01-29 DIAGNOSIS — E119 Type 2 diabetes mellitus without complications: Secondary | ICD-10-CM | POA: Diagnosis not present

## 2023-01-29 DIAGNOSIS — H1045 Other chronic allergic conjunctivitis: Secondary | ICD-10-CM | POA: Diagnosis not present

## 2023-01-29 DIAGNOSIS — H25813 Combined forms of age-related cataract, bilateral: Secondary | ICD-10-CM | POA: Diagnosis not present

## 2023-01-29 LAB — HM DIABETES EYE EXAM

## 2023-02-02 ENCOUNTER — Other Ambulatory Visit: Payer: Self-pay

## 2023-02-12 ENCOUNTER — Other Ambulatory Visit: Payer: Self-pay

## 2023-03-05 ENCOUNTER — Other Ambulatory Visit: Payer: Self-pay

## 2023-03-05 ENCOUNTER — Ambulatory Visit: Payer: Medicare Other | Admitting: Family Medicine

## 2023-03-05 VITALS — BP 130/70 | HR 81 | Ht 64.0 in | Wt 149.8 lb

## 2023-03-05 DIAGNOSIS — E785 Hyperlipidemia, unspecified: Secondary | ICD-10-CM

## 2023-03-05 DIAGNOSIS — N1832 Chronic kidney disease, stage 3b: Secondary | ICD-10-CM

## 2023-03-05 DIAGNOSIS — Z794 Long term (current) use of insulin: Secondary | ICD-10-CM | POA: Diagnosis not present

## 2023-03-05 DIAGNOSIS — I129 Hypertensive chronic kidney disease with stage 1 through stage 4 chronic kidney disease, or unspecified chronic kidney disease: Secondary | ICD-10-CM | POA: Diagnosis not present

## 2023-03-05 DIAGNOSIS — I152 Hypertension secondary to endocrine disorders: Secondary | ICD-10-CM

## 2023-03-05 DIAGNOSIS — E1159 Type 2 diabetes mellitus with other circulatory complications: Secondary | ICD-10-CM | POA: Diagnosis not present

## 2023-03-05 DIAGNOSIS — Z23 Encounter for immunization: Secondary | ICD-10-CM | POA: Diagnosis not present

## 2023-03-05 DIAGNOSIS — E1122 Type 2 diabetes mellitus with diabetic chronic kidney disease: Secondary | ICD-10-CM | POA: Diagnosis not present

## 2023-03-05 DIAGNOSIS — E1169 Type 2 diabetes mellitus with other specified complication: Secondary | ICD-10-CM

## 2023-03-05 DIAGNOSIS — E1142 Type 2 diabetes mellitus with diabetic polyneuropathy: Secondary | ICD-10-CM

## 2023-03-05 LAB — POCT GLYCOSYLATED HEMOGLOBIN (HGB A1C): HbA1c, POC (controlled diabetic range): 14.3 % — AB (ref 0.0–7.0)

## 2023-03-05 LAB — GLUCOSE, POCT (MANUAL RESULT ENTRY): POC Glucose: 346 mg/dl — AB (ref 70–99)

## 2023-03-05 MED ORDER — ATORVASTATIN CALCIUM 40 MG PO TABS
40.0000 mg | ORAL_TABLET | Freq: Every day | ORAL | 1 refills | Status: DC
Start: 2023-03-05 — End: 2023-07-24
  Filled 2023-03-05 – 2023-04-02 (×3): qty 90, 90d supply, fill #0
  Filled 2023-06-23: qty 90, 90d supply, fill #1

## 2023-03-05 MED ORDER — CARVEDILOL 3.125 MG PO TABS
3.1250 mg | ORAL_TABLET | Freq: Two times a day (BID) | ORAL | 1 refills | Status: DC
Start: 2023-03-05 — End: 2023-07-24
  Filled 2023-03-05 – 2023-03-31 (×2): qty 180, 90d supply, fill #0
  Filled 2023-07-12: qty 180, 90d supply, fill #1

## 2023-03-05 MED ORDER — GABAPENTIN 300 MG PO CAPS
300.0000 mg | ORAL_CAPSULE | Freq: Every day | ORAL | 1 refills | Status: DC
Start: 2023-03-05 — End: 2023-10-24
  Filled 2023-03-05 – 2023-04-16 (×2): qty 90, 90d supply, fill #0
  Filled 2023-07-15 – 2023-07-16 (×2): qty 90, 90d supply, fill #1

## 2023-03-05 MED ORDER — AMLODIPINE BESYLATE 10 MG PO TABS
10.0000 mg | ORAL_TABLET | Freq: Every day | ORAL | 1 refills | Status: DC
Start: 2023-03-05 — End: 2023-07-24
  Filled 2023-03-05 – 2023-05-25 (×3): qty 90, 90d supply, fill #0

## 2023-03-05 MED ORDER — VALSARTAN-HYDROCHLOROTHIAZIDE 320-25 MG PO TABS
1.0000 | ORAL_TABLET | Freq: Every day | ORAL | 1 refills | Status: DC
Start: 2023-03-05 — End: 2023-07-24
  Filled 2023-03-05 – 2023-05-07 (×2): qty 90, 90d supply, fill #0

## 2023-03-05 MED ORDER — GLIPIZIDE 10 MG PO TABS: 10.0000 mg | ORAL_TABLET | Freq: Two times a day (BID) | ORAL | Status: DC

## 2023-03-05 NOTE — Progress Notes (Signed)
Subjective:  Patient ID: Jerry Serrano, male    DOB: [birth year]  Age: 72 y.o. MRN: [id number]  CC: Medical Management of Chronic Issues (No questions or concerns today.)   HPI Jerry Serrano is a 72 y.o. year old male with a history of hypertension, hyperlipidemia, type 2 diabetes mellitus (A1c 10.2), stage III CKD who presents today for a  follow-up visit.      Interval History: Discussed the use of AI scribe software for clinical note transcription with the patient, who gave verbal consent to proceed.  He presents for a routine follow-up. He reports adherence to his current medication regimen, including blood pressure medications and glipizide for diabetes.  He remains on a statin.  The patient's blood pressure was initially high at the start of the visit at 147/74, but normalized after rechecking. He has not seen his nephrologist in a while for management of his CKD, but reports no new kidney-related symptoms and denies taking NSAIDs. He also reports normal bowel movements and regular exercise at work.  The patient's A1c has significantly increased to 14.3 up from 10.2, indicating poor blood sugar control over the past few months. He was unaware of this as he was not checking his blood sugar regularly due to issues with his glucose meter.        Past Medical History:  Diagnosis Date   Allergy    Arthritis    Diabetes mellitus    Hyperlipidemia    Hypertension     Past Surgical History:  Procedure Laterality Date   HERNIA REPAIR      Family History  Problem Relation Age of Onset   Colon cancer Neg Hx    Esophageal cancer Neg Hx    Rectal cancer Neg Hx    Stomach cancer Neg Hx     Social History   Socioeconomic History   Marital status: Married    Spouse name: Not on file   Number of children: Not on file   Years of education: Not on file   Highest education level: Not on file  Occupational History   Not on file  Tobacco Use   Smoking status: Former     Types: Cigarettes   Smokeless tobacco: Never   Tobacco comments:    over 30 years ago  Vaping Use   Vaping status: Never Used  Substance and Sexual Activity   Alcohol use: No   Drug use: No   Sexual activity: Not on file  Other Topics Concern   Not on file  Social History Narrative   Not on file   Social Determinants of Health   Financial Resource Strain: Low Risk  (01/24/2023)   Overall Financial Resource Strain (CARDIA)    Difficulty of Paying Living Expenses: Not hard at all  Food Insecurity: No Food Insecurity (01/24/2023)   Hunger Vital Sign    Worried About Running Out of Food in the Last Year: Never true    Ran Out of Food in the Last Year: Never true  Transportation Needs: No Transportation Needs (01/24/2023)   PRAPARE - Administrator, Civil Service (Medical): No    Lack of Transportation (Non-Medical): No  Physical Activity: Sufficiently Active (01/24/2023)   Exercise Vital Sign    Days of Exercise per Week: 5 days    Minutes of Exercise per Session: 30 min  Stress: No Stress Concern Present (01/24/2023)   [employer] of Occupational Health - Occupational Stress Questionnaire    Feeling of  Stress : Not at all  Social Connections: Moderately Integrated (01/24/2023)   Social Connection and Isolation Panel [NHANES]    Frequency of Communication with Friends and Family: More than three times a week    Frequency of Social Gatherings with Friends and Family: Three times a week    Attends Religious Services: 1 to 4 times per year    Active Member of Clubs or Organizations: Yes    Attends Banker Meetings: More than 4 times per year    Marital Status: Widowed    No Known Allergies  Outpatient Medications Prior to Visit  Medication Sig Dispense Refill   aspirin EC 81 MG tablet Take 1 tablet (81 mg total) by mouth daily. 30 tablet 3   betamethasone dipropionate 0.05 % cream APPLY EXTERNALLY TO THE AFFECTED AREA TWICE DAILY 30 g 0   Blood  Glucose Monitoring Suppl (ACCU-CHEK AVIVA PLUS) w/Device KIT USE AS DIRECTED 4 TIMES DAILY AFTER MEALS AND AT BEDTIME. 1 kit 0   glucose blood (ACCU-CHEK AVIVA PLUS) test strip USE 1 STRIP TO CHECK GLUCOSE THREE TIMES DAILY FOR BLOOD SUGAR --MUST  HAVE  OFFICE  VISIT  FOR  REFILLS 300 each 2   insulin glargine (LANTUS) 100 UNIT/ML Solostar Pen Inject 35 Units into the skin daily. 30 mL 6   ketoconazole (NIZORAL) 2 % cream Apply 1 application topically daily. 15 g 0   Lancet Devices (ACCU-CHEK SOFTCLIX) lancets Use as instructed 1 each 12   MICROLET LANCETS MISC Test 3 times per day 100 each 12   olopatadine (PATANOL) 0.1 % ophthalmic solution Place 1 drop into both eyes 2 (two) times daily.     pantoprazole (PROTONIX) 40 MG tablet Take 1 tablet (40 mg total) by mouth daily. 90 tablet 1   sildenafil (VIAGRA) 50 MG tablet Take 1 tablet (50 mg total) by mouth daily as needed for erectile dysfunction. 10 tablet 1   traMADol (ULTRAM) 50 MG tablet Take 1 tablet (50 mg total) by mouth at bedtime as needed. 30 tablet 1   amLODipine (NORVASC) 10 MG tablet Take 1 tablet (10 mg total) by mouth daily. 90 tablet 1   atorvastatin (LIPITOR) 40 MG tablet Take 1 tablet (40 mg total) by mouth daily. 90 tablet 1   carvedilol (COREG) 3.125 MG tablet Take 1 tablet (3.125 mg total) by mouth 2 (two) times daily with a meal. 180 tablet 1   gabapentin (NEURONTIN) 300 MG capsule Take 1 capsule (300 mg total) by mouth daily. 90 capsule 1   glipiZIDE (GLUCOTROL) 5 MG tablet Take 1 tablet (5 mg total) by mouth 2 (two) times daily before a meal. 180 tablet 1   valsartan-hydrochlorothiazide (DIOVAN-HCT) 320-25 MG tablet Take 1 tablet by mouth daily. 90 tablet 1   Facility-Administered Medications Prior to Visit  Medication Dose Route Frequency Provider Last Rate Last Admin   0.9 %  sodium chloride infusion  500 mL Intravenous Continuous Danis, Starr Lake III, MD         ROS Review of Systems  Constitutional:  Negative for  activity change and appetite change.  HENT:  Negative for sinus pressure and sore throat.   Respiratory:  Negative for chest tightness, shortness of breath and wheezing.   Cardiovascular:  Negative for chest pain and palpitations.  Gastrointestinal:  Negative for abdominal distention, abdominal pain and constipation.  Genitourinary: Negative.   Musculoskeletal: Negative.   Psychiatric/Behavioral:  Negative for behavioral problems and dysphoric mood.     Objective:  BP (!) 147/74 (BP Location: Left Arm, Patient Position: Sitting, Cuff Size: Normal)   Pulse 81   Ht 5\' 4"  (1.626 m)   Wt 149 lb 12.8 oz (67.9 kg)   SpO2 97%   BMI 25.71 kg/m      03/05/2023   10:44 AM 01/24/2023    2:59 PM 11/27/2022   11:01 AM  BP/Weight  Systolic BP 147 -- 153  Diastolic BP 74 -- 78  Wt. (Lbs) 149.8 130   BMI 25.71 kg/m2 22.31 kg/m2       Physical Exam Constitutional:      Appearance: He is well-developed.  Cardiovascular:     Rate and Rhythm: Normal rate.     Heart sounds: Normal heart sounds. No murmur heard. Pulmonary:     Effort: Pulmonary effort is normal.     Breath sounds: Normal breath sounds. No wheezing or rales.  Chest:     Chest wall: No tenderness.  Abdominal:     General: Bowel sounds are normal. There is no distension.     Palpations: Abdomen is soft. There is no mass.     Tenderness: There is no abdominal tenderness.  Musculoskeletal:        General: Normal range of motion.     Right lower leg: No edema.     Left lower leg: No edema.  Neurological:     Mental Status: He is alert and oriented to person, place, and time.  Psychiatric:        Mood and Affect: Mood normal.        Latest Ref Rng & Units 09/26/2022   10:27 AM 04/25/2022    9:23 AM 01/31/2022    8:49 AM  CMP  Glucose 70 - 99 mg/dL 098  119  147   BUN 8 - 27 mg/dL 29  31  29    Creatinine 0.76 - 1.27 mg/dL 8.29  5.62  1.30   Sodium 134 - 144 mmol/L 139  138  136   Potassium 3.5 - 5.2 mmol/L 4.8  4.5   4.4   Chloride 96 - 106 mmol/L 104  101  100   CO2 20 - 29 mmol/L 17  20  22    Calcium 8.6 - 10.2 mg/dL 9.8  86.5  9.6   Total Protein 6.0 - 8.5 g/dL 7.3  7.4  7.3   Total Bilirubin 0.0 - 1.2 mg/dL 0.5  0.3  0.4   Alkaline Phos 44 - 121 IU/L 100  141  92   AST 0 - 40 IU/L 23  21  29    ALT 0 - 44 IU/L 26  25  23      Lipid Panel     Component Value Date/Time   CHOL 158 01/17/2022 0929   TRIG 67 01/17/2022 0929   HDL 57 01/17/2022 0929   CHOLHDL 3.0 11/16/2020 1042   CHOLHDL 2.8 02/29/2016 1018   VLDL 12 02/29/2016 1018   LDLCALC 88 01/17/2022 0929    CBC    Component Value Date/Time   WBC 6.1 07/18/2019 1153   WBC 7.6 05/05/2013 1116   RBC 4.73 07/18/2019 1153   RBC 4.82 05/05/2013 1116   HGB 12.6 (L) 07/18/2019 1153   HCT 37.6 07/18/2019 1153   PLT 368 07/18/2019 1153   MCV 80 07/18/2019 1153   MCH 26.6 07/18/2019 1153   MCH 28.0 05/05/2013 1116   MCHC 33.5 07/18/2019 1153   MCHC 35.7 05/05/2013 1116   RDW 17.0 (H) 07/18/2019 1153  LYMPHSABS 2.0 07/18/2019 1153   MONOABS 0.7 05/05/2013 1116   EOSABS 0.4 07/18/2019 1153   BASOSABS 0.1 07/18/2019 1153    Lab Results  Component Value Date   HGBA1C 14.3 (A) 03/05/2023    Assessment & Plan:      Uncontrolled type II Diabetes Mellitus A1c significantly elevated at 14.3, indicating poor glycemic control over the past few months. Patient has not been monitoring blood glucose due to issues with glucometer, now resolved. Nursing staff checked his glucometer and assisted him with it today.  - Currently on Glipizide 5mg  BID and Lantus 35 units daily. -Increase Glipizide to 10mg  BID. -Advise patient to monitor blood glucose regularly. -Schedule follow-up appointment to review blood glucose readings and adjust treatment as necessary.  Hypertension Initial blood pressure reading slightly elevated, but repeat reading within normal range. Patient is currently on Amlodipine, Carvedilol, and  Valsartan/Hydrochlorothiazide. -Continue current antihypertensive medications. -Monitor blood pressure regularly.  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 Patient has not seen nephrologist recently. -Advise patient to follow up with nephrologist regularly. -Avoid nephrotoxic medications like Aleve and Ibuprofen.  Hyperlipidemia Last cholesterol check was normal, but from the previous year. -Continue Atorvastatin. -Plan for a cholesterol check.  General Health Maintenance -Continue low sodium diet and regular exercise.          Meds ordered this encounter  Medications   amLODipine (NORVASC) 10 MG tablet    Sig: Take 1 tablet (10 mg total) by mouth daily.    Dispense:  90 tablet    Refill:  1   atorvastatin (LIPITOR) 40 MG tablet    Sig: Take 1 tablet (40 mg total) by mouth daily.    Dispense:  90 tablet    Refill:  1   carvedilol (COREG) 3.125 MG tablet    Sig: Take 1 tablet (3.125 mg total) by mouth 2 (two) times daily with a meal.    Dispense:  180 tablet    Refill:  1   gabapentin (NEURONTIN) 300 MG capsule    Sig: Take 1 capsule (300 mg total) by mouth daily.    Dispense:  90 capsule    Refill:  1   glipiZIDE (GLUCOTROL) 10 MG tablet    Sig: Take 1 tablet (10 mg total) by mouth 2 (two) times daily before a meal.    Dose increase   valsartan-hydrochlorothiazide (DIOVAN-HCT) 320-25 MG tablet    Sig: Take 1 tablet by mouth daily.    Dispense:  90 tablet    Refill:  1    Follow-up: Return in about 1 month (around 04/05/2023) for Diabetes follow-up with PCP.       Hoy Register, MD, FAAFP. [facility] [place], [place] [phone]   03/05/2023, 11:17 AM

## 2023-03-05 NOTE — Patient Instructions (Signed)

## 2023-03-06 LAB — CMP14+EGFR
ALT: 37 IU/L (ref 0–44)
AST: 23 IU/L (ref 0–40)
Albumin: 4.6 g/dL (ref 3.8–4.8)
Alkaline Phosphatase: 198 IU/L — ABNORMAL HIGH (ref 44–121)
BUN/Creatinine Ratio: 17 (ref 10–24)
BUN: 32 mg/dL — ABNORMAL HIGH (ref 8–27)
Bilirubin Total: 0.4 mg/dL (ref 0.0–1.2)
CO2: 21 mmol/L (ref 20–29)
Calcium: 9.8 mg/dL (ref 8.6–10.2)
Chloride: 99 mmol/L (ref 96–106)
Creatinine, Ser: 1.87 mg/dL — ABNORMAL HIGH (ref 0.76–1.27)
Globulin, Total: 2.7 g/dL (ref 1.5–4.5)
Glucose: 334 mg/dL — ABNORMAL HIGH (ref 70–99)
Potassium: 5 mmol/L (ref 3.5–5.2)
Sodium: 134 mmol/L (ref 134–144)
Total Protein: 7.3 g/dL (ref 6.0–8.5)
eGFR: 37 mL/min/{1.73_m2} — ABNORMAL LOW (ref >59)

## 2023-03-07 ENCOUNTER — Other Ambulatory Visit: Payer: Self-pay | Admitting: Family Medicine

## 2023-03-07 DIAGNOSIS — R748 Abnormal levels of other serum enzymes: Secondary | ICD-10-CM

## 2023-03-09 ENCOUNTER — Other Ambulatory Visit: Payer: Self-pay | Admitting: Family Medicine

## 2023-03-09 DIAGNOSIS — R972 Elevated prostate specific antigen [PSA]: Secondary | ICD-10-CM

## 2023-03-13 ENCOUNTER — Other Ambulatory Visit: Payer: Self-pay | Admitting: Family Medicine

## 2023-03-13 DIAGNOSIS — M19249 Secondary osteoarthritis, unspecified hand: Secondary | ICD-10-CM

## 2023-03-14 ENCOUNTER — Other Ambulatory Visit: Payer: Self-pay

## 2023-03-14 MED ORDER — TRAMADOL HCL 50 MG PO TABS
50.0000 mg | ORAL_TABLET | Freq: Every evening | ORAL | 1 refills | Status: DC | PRN
Start: 2023-03-14 — End: 2023-05-25
  Filled 2023-03-14: qty 30, 30d supply, fill #0
  Filled 2023-04-02 – 2023-04-13 (×2): qty 30, 30d supply, fill #1

## 2023-03-15 ENCOUNTER — Other Ambulatory Visit: Payer: Self-pay

## 2023-03-20 ENCOUNTER — Other Ambulatory Visit: Payer: Self-pay

## 2023-03-23 ENCOUNTER — Other Ambulatory Visit: Payer: Self-pay

## 2023-03-26 ENCOUNTER — Other Ambulatory Visit: Payer: Self-pay

## 2023-03-30 LAB — PSA: Prostate Specific Ag, Serum: 7.4 ng/mL — ABNORMAL HIGH (ref 0.0–4.0)

## 2023-03-30 LAB — SPECIMEN STATUS REPORT

## 2023-04-02 ENCOUNTER — Other Ambulatory Visit: Payer: Self-pay

## 2023-04-09 DIAGNOSIS — N1832 Chronic kidney disease, stage 3b: Secondary | ICD-10-CM | POA: Diagnosis not present

## 2023-04-10 ENCOUNTER — Ambulatory Visit: Payer: Medicare Other | Admitting: Family Medicine

## 2023-04-13 ENCOUNTER — Other Ambulatory Visit: Payer: Self-pay

## 2023-04-16 ENCOUNTER — Other Ambulatory Visit: Payer: Self-pay | Admitting: Family Medicine

## 2023-04-16 ENCOUNTER — Other Ambulatory Visit: Payer: Self-pay

## 2023-04-16 DIAGNOSIS — E1142 Type 2 diabetes mellitus with diabetic polyneuropathy: Secondary | ICD-10-CM

## 2023-04-16 MED ORDER — GLIPIZIDE 10 MG PO TABS
10.0000 mg | ORAL_TABLET | Freq: Two times a day (BID) | ORAL | 1 refills | Status: DC
Start: 2023-04-16 — End: 2023-06-26
  Filled 2023-04-16: qty 180, 90d supply, fill #0

## 2023-04-17 ENCOUNTER — Other Ambulatory Visit: Payer: Self-pay

## 2023-04-17 DIAGNOSIS — I129 Hypertensive chronic kidney disease with stage 1 through stage 4 chronic kidney disease, or unspecified chronic kidney disease: Secondary | ICD-10-CM | POA: Diagnosis not present

## 2023-04-17 DIAGNOSIS — N281 Cyst of kidney, acquired: Secondary | ICD-10-CM | POA: Diagnosis not present

## 2023-04-17 DIAGNOSIS — E1122 Type 2 diabetes mellitus with diabetic chronic kidney disease: Secondary | ICD-10-CM | POA: Diagnosis not present

## 2023-04-17 DIAGNOSIS — E875 Hyperkalemia: Secondary | ICD-10-CM | POA: Diagnosis not present

## 2023-04-17 DIAGNOSIS — N1832 Chronic kidney disease, stage 3b: Secondary | ICD-10-CM | POA: Diagnosis not present

## 2023-04-23 ENCOUNTER — Other Ambulatory Visit: Payer: Self-pay | Admitting: Pharmacist

## 2023-04-23 ENCOUNTER — Encounter: Payer: Self-pay | Admitting: Family Medicine

## 2023-04-23 ENCOUNTER — Other Ambulatory Visit: Payer: Self-pay

## 2023-04-23 ENCOUNTER — Ambulatory Visit: Payer: Medicare Other | Attending: Family Medicine | Admitting: Family Medicine

## 2023-04-23 DIAGNOSIS — E1142 Type 2 diabetes mellitus with diabetic polyneuropathy: Secondary | ICD-10-CM | POA: Diagnosis not present

## 2023-04-23 DIAGNOSIS — Z794 Long term (current) use of insulin: Secondary | ICD-10-CM | POA: Diagnosis not present

## 2023-04-23 DIAGNOSIS — E1122 Type 2 diabetes mellitus with diabetic chronic kidney disease: Secondary | ICD-10-CM

## 2023-04-23 DIAGNOSIS — E1169 Type 2 diabetes mellitus with other specified complication: Secondary | ICD-10-CM

## 2023-04-23 DIAGNOSIS — K219 Gastro-esophageal reflux disease without esophagitis: Secondary | ICD-10-CM

## 2023-04-23 DIAGNOSIS — I152 Hypertension secondary to endocrine disorders: Secondary | ICD-10-CM

## 2023-04-23 LAB — GLUCOSE, POCT (MANUAL RESULT ENTRY): POC Glucose: 231 mg/dL — AB (ref 70–99)

## 2023-04-23 MED ORDER — ACCU-CHEK SOFTCLIX LANCETS MISC
6 refills | Status: DC
  Filled 2023-04-23: qty 100, 33d supply, fill #0
  Filled 2023-07-16: qty 100, 33d supply, fill #1

## 2023-04-23 MED ORDER — INSULIN GLARGINE 100 UNIT/ML SOLOSTAR PEN
38.0000 [IU] | PEN_INJECTOR | Freq: Every day | SUBCUTANEOUS | 6 refills | Status: DC
  Filled 2023-04-23: qty 9, 23d supply, fill #0
  Filled 2023-05-28: qty 9, 23d supply, fill #1
  Filled 2023-06-22: qty 9, 23d supply, fill #2
  Filled 2023-08-14: qty 9, 23d supply, fill #3
  Filled 2023-10-03: qty 9, 23d supply, fill #4

## 2023-04-23 MED ORDER — ACCU-CHEK GUIDE W/DEVICE KIT
PACK | 0 refills | Status: DC
  Filled 2023-04-23: qty 1, 30d supply, fill #0

## 2023-04-23 MED ORDER — ACCU-CHEK GUIDE VI STRP
1.0000 | ORAL_STRIP | Freq: Three times a day (TID) | 2 refills | Status: DC
  Filled 2023-04-23: qty 100, 34d supply, fill #0

## 2023-04-23 NOTE — Patient Instructions (Signed)

## 2023-04-23 NOTE — Progress Notes (Unsigned)
Subjective:  Patient ID: Jerry Serrano, male    DOB: [birth year]  Age: 72 y.o. MRN: [id number]  CC: Medical Management of Chronic Issues   HPI Jerry Serrano is a 72 y.o. year old male with a history of hypertension, hyperlipidemia, type 2 diabetes mellitus (A1c 10.2), stage III CKD who presents today for a  follow-up visit.   Interval History: Discussed the use of AI scribe software for clinical note transcription with the patient, who gave verbal consent to proceed.   The patient, with a history of diabetes, presents with persistently high blood sugars despite taking Glipizide 10mg  twice daily (glipizide was increased from 5 mg twice daily to 10 mg twice daily at his last visit) and 35 units of insulin daily. He reports blood sugars as high as 500-600, but has not been checking his sugars at home recently due to concerns about the accuracy of his glucose meter. He also reports that his diet includes a lot of rice, which he understands can contribute to high blood sugars. He has recently started making dietary changes, including eating less rice and more fish and vegetables, and eating earlier in the day.        Past Medical History:  Diagnosis Date   Allergy    Arthritis    Diabetes mellitus    Hyperlipidemia    Hypertension     Past Surgical History:  Procedure Laterality Date   HERNIA REPAIR      Family History  Problem Relation Age of Onset   Colon cancer Neg Hx    Esophageal cancer Neg Hx    Rectal cancer Neg Hx    Stomach cancer Neg Hx     Social History   Socioeconomic History   Marital status: Married    Spouse name: Not on file   Number of children: Not on file   Years of education: Not on file   Highest education level: Not on file  Occupational History   Not on file  Tobacco Use   Smoking status: Former    Types: Cigarettes   Smokeless tobacco: Never   Tobacco comments:    over 30 years ago  Vaping Use   Vaping status: Never Used  Substance  and Sexual Activity   Alcohol use: No   Drug use: No   Sexual activity: Not on file  Other Topics Concern   Not on file  Social History Narrative   Not on file   Social Determinants of Health   Financial Resource Strain: Low Risk  (01/24/2023)   Overall Financial Resource Strain (CARDIA)    Difficulty of Paying Living Expenses: Not hard at all  Food Insecurity: No Food Insecurity (01/24/2023)   Hunger Vital Sign    Worried About Running Out of Food in the Last Year: Never true    Ran Out of Food in the Last Year: Never true  Transportation Needs: No Transportation Needs (01/24/2023)   PRAPARE - Administrator, Civil Service (Medical): No    Lack of Transportation (Non-Medical): No  Physical Activity: Sufficiently Active (01/24/2023)   Exercise Vital Sign    Days of Exercise per Week: 5 days    Minutes of Exercise per Session: 30 min  Stress: No Stress Concern Present (01/24/2023)   [employer] of Occupational Health - Occupational Stress Questionnaire    Feeling of Stress : Not at all  Social Connections: Moderately Integrated (01/24/2023)   Social Connection and Isolation Panel [NHANES]  Frequency of Communication with Friends and Family: More than three times a week    Frequency of Social Gatherings with Friends and Family: Three times a week    Attends Religious Services: 1 to 4 times per year    Active Member of Clubs or Organizations: Yes    Attends Banker Meetings: More than 4 times per year    Marital Status: Widowed    No Known Allergies  Outpatient Medications Prior to Visit  Medication Sig Dispense Refill   amLODipine (NORVASC) 10 MG tablet Take 1 tablet (10 mg total) by mouth daily. 90 tablet 1   aspirin EC 81 MG tablet Take 1 tablet (81 mg total) by mouth daily. 30 tablet 3   atorvastatin (LIPITOR) 40 MG tablet Take 1 tablet (40 mg total) by mouth daily. 90 tablet 1   betamethasone dipropionate 0.05 % cream APPLY EXTERNALLY TO THE  AFFECTED AREA TWICE DAILY 30 g 0   carvedilol (COREG) 3.125 MG tablet Take 1 tablet (3.125 mg total) by mouth 2 (two) times daily with a meal. 180 tablet 1   gabapentin (NEURONTIN) 300 MG capsule Take 1 capsule (300 mg total) by mouth daily. 90 capsule 1   glipiZIDE (GLUCOTROL) 10 MG tablet Take 1 tablet (10 mg total) by mouth 2 (two) times daily before a meal. 180 tablet 1   ketoconazole (NIZORAL) 2 % cream Apply 1 application topically daily. 15 g 0   Lancet Devices (ACCU-CHEK SOFTCLIX) lancets Use as instructed 1 each 12   olopatadine (PATANOL) 0.1 % ophthalmic solution Place 1 drop into both eyes 2 (two) times daily.     pantoprazole (PROTONIX) 40 MG tablet Take 1 tablet (40 mg total) by mouth daily. 90 tablet 1   sildenafil (VIAGRA) 50 MG tablet Take 1 tablet (50 mg total) by mouth daily as needed for erectile dysfunction. 10 tablet 1   traMADol (ULTRAM) 50 MG tablet Take 1 tablet (50 mg total) by mouth at bedtime as needed. 30 tablet 1   valsartan-hydrochlorothiazide (DIOVAN-HCT) 320-25 MG tablet Take 1 tablet by mouth daily. 90 tablet 1   Blood Glucose Monitoring Suppl (ACCU-CHEK AVIVA PLUS) w/Device KIT USE AS DIRECTED 4 TIMES DAILY AFTER MEALS AND AT BEDTIME. 1 kit 0   glucose blood (ACCU-CHEK AVIVA PLUS) test strip USE 1 STRIP TO CHECK GLUCOSE THREE TIMES DAILY FOR BLOOD SUGAR --MUST  HAVE  OFFICE  VISIT  FOR  REFILLS 300 each 2   insulin glargine (LANTUS) 100 UNIT/ML Solostar Pen Inject 35 Units into the skin daily. 30 mL 6   MICROLET LANCETS MISC Test 3 times per day 100 each 12   Facility-Administered Medications Prior to Visit  Medication Dose Route Frequency Provider Last Rate Last Admin   0.9 %  sodium chloride infusion  500 mL Intravenous Continuous Danis, Starr Lake III, MD         ROS Review of Systems  Constitutional:  Negative for activity change and appetite change.  HENT:  Negative for sinus pressure and sore throat.   Respiratory:  Negative for chest tightness,  shortness of breath and wheezing.   Cardiovascular:  Negative for chest pain and palpitations.  Gastrointestinal:  Negative for abdominal distention, abdominal pain and constipation.  Genitourinary: Negative.   Musculoskeletal: Negative.   Psychiatric/Behavioral:  Negative for behavioral problems and dysphoric mood.     Objective:  BP 125/71   Pulse 89   Ht 5\' 4"  (1.626 m)   Wt 146 lb 3.2 oz (66.3  kg)   SpO2 98%   BMI 25.10 kg/m      04/23/2023    2:42 PM 04/23/2023    2:14 PM 03/05/2023   11:33 AM  BP/Weight  Systolic BP 125 145 130  Diastolic BP 71 75 70  Wt. (Lbs)  146.2   BMI  25.1 kg/m2       Physical Exam Constitutional:      Appearance: He is well-developed.  Cardiovascular:     Rate and Rhythm: Normal rate.     Heart sounds: Normal heart sounds. No murmur heard. Pulmonary:     Effort: Pulmonary effort is normal.     Breath sounds: Normal breath sounds. No wheezing or rales.  Chest:     Chest wall: No tenderness.  Abdominal:     General: Bowel sounds are normal. There is no distension.     Palpations: Abdomen is soft. There is no mass.     Tenderness: There is no abdominal tenderness.  Musculoskeletal:        General: Normal range of motion.     Right lower leg: No edema.     Left lower leg: No edema.  Neurological:     Mental Status: He is alert and oriented to person, place, and time.  Psychiatric:        Mood and Affect: Mood normal.        Latest Ref Rng & Units 03/05/2023   11:25 AM 09/26/2022   10:27 AM 04/25/2022    9:23 AM  CMP  Glucose 70 - 99 mg/dL 401  027  253   BUN 8 - 27 mg/dL 32  29  31   Creatinine 0.76 - 1.27 mg/dL 6.64  4.03  4.74   Sodium 134 - 144 mmol/L 134  139  138   Potassium 3.5 - 5.2 mmol/L 5.0  4.8  4.5   Chloride 96 - 106 mmol/L 99  104  101   CO2 20 - 29 mmol/L 21  17  20    Calcium 8.6 - 10.2 mg/dL 9.8  9.8  25.9   Total Protein 6.0 - 8.5 g/dL 7.3  7.3  7.4   Total Bilirubin 0.0 - 1.2 mg/dL 0.4  0.5  0.3    Alkaline Phos 44 - 121 IU/L 198  100  141   AST 0 - 40 IU/L 23  23  21    ALT 0 - 44 IU/L 37  26  25     Lipid Panel     Component Value Date/Time   CHOL 158 01/17/2022 0929   TRIG 67 01/17/2022 0929   HDL 57 01/17/2022 0929   CHOLHDL 3.0 11/16/2020 1042   CHOLHDL 2.8 02/29/2016 1018   VLDL 12 02/29/2016 1018   LDLCALC 88 01/17/2022 0929    CBC    Component Value Date/Time   WBC 6.1 07/18/2019 1153   WBC 7.6 05/05/2013 1116   RBC 4.73 07/18/2019 1153   RBC 4.82 05/05/2013 1116   HGB 12.6 (L) 07/18/2019 1153   HCT 37.6 07/18/2019 1153   PLT 368 07/18/2019 1153   MCV 80 07/18/2019 1153   MCH 26.6 07/18/2019 1153   MCH 28.0 05/05/2013 1116   MCHC 33.5 07/18/2019 1153   MCHC 35.7 05/05/2013 1116   RDW 17.0 (H) 07/18/2019 1153   LYMPHSABS 2.0 07/18/2019 1153   MONOABS 0.7 05/05/2013 1116   EOSABS 0.4 07/18/2019 1153   BASOSABS 0.1 07/18/2019 1153    Lab Results  Component Value Date  HGBA1C 14.3 (A) 03/05/2023    Assessment & Plan:      Type 2 Diabetes Mellitus   Poorly controlled with reported blood glucose levels up to 500-600. Patient is currently on Metformin 10mg  twice daily and Insulin 35 units daily. Patient has not been consistently monitoring blood glucose at home due to concerns about the accuracy of his current meter.   -CBG in the clinic is 231 -Increase Insulin to 38 units daily.   -Prescribe new glucose meter and test strips.   -Encourage consistent home glucose monitoring and bring results to next appointment.   -Provide dietary counseling focusing on reducing rice and bread intake and increasing vegetables and lean proteins.   -Schedule follow-up appointment with Franky Macho to assess response to increased insulin dose and review home glucose monitoring results and titrate his insulin accordingly -Counseled on Diabetic diet, my plate method, 409 minutes of moderate intensity exercise/week Blood sugar logs with fasting goals of 80-120 mg/dl, random of less  than 811 and in the event of sugars less than 60 mg/dl or greater than 914 mg/dl encouraged to notify the clinic. Advised on the need for annual eye exams, annual foot exams, Pneumonia vaccine.           Meds ordered this encounter  Medications   insulin glargine (LANTUS) 100 UNIT/ML Solostar Pen    Sig: Inject 38 Units into the skin daily.    Dispense:  30 mL    Refill:  6    Dose increase   Blood Glucose Monitoring Suppl (ACCU-CHEK GUIDE) w/Device KIT    Sig: Use as directed four times daily.    Dispense:  1 kit    Refill:  0   glucose blood (ACCU-CHEK GUIDE) test strip    Sig: Test in the morning, at noon, and at bedtime.    Dispense:  300 strip    Refill:  2    Follow-up: Return in about 1 month (around 05/24/2023) for Blood pressure follow-up with Franky Macho, Medical conditions with PCP, in 3 months.       Hoy Register, MD, FAAFP. [facility] [place], [place] [phone]   04/24/2023, 6:23 PM

## 2023-04-24 ENCOUNTER — Encounter: Payer: Self-pay | Admitting: Family Medicine

## 2023-04-24 ENCOUNTER — Other Ambulatory Visit: Payer: Self-pay

## 2023-05-07 ENCOUNTER — Other Ambulatory Visit: Payer: Self-pay

## 2023-05-08 ENCOUNTER — Other Ambulatory Visit: Payer: Self-pay

## 2023-05-14 ENCOUNTER — Other Ambulatory Visit: Payer: Self-pay

## 2023-05-23 ENCOUNTER — Other Ambulatory Visit: Payer: Self-pay

## 2023-05-25 ENCOUNTER — Other Ambulatory Visit: Payer: Self-pay

## 2023-05-25 ENCOUNTER — Other Ambulatory Visit: Payer: Self-pay | Admitting: Family Medicine

## 2023-05-25 DIAGNOSIS — M19249 Secondary osteoarthritis, unspecified hand: Secondary | ICD-10-CM

## 2023-05-25 MED ORDER — TRAMADOL HCL 50 MG PO TABS
50.0000 mg | ORAL_TABLET | Freq: Every evening | ORAL | 1 refills | Status: DC | PRN
  Filled 2023-05-25: qty 30, 30d supply, fill #0
  Filled 2023-07-01: qty 30, 30d supply, fill #1

## 2023-05-28 ENCOUNTER — Ambulatory Visit: Payer: Medicare Other | Attending: Family Medicine | Admitting: Pharmacist

## 2023-05-28 ENCOUNTER — Other Ambulatory Visit: Payer: Self-pay

## 2023-05-28 ENCOUNTER — Encounter: Payer: Self-pay | Admitting: Pharmacist

## 2023-05-28 DIAGNOSIS — Z7984 Long term (current) use of oral hypoglycemic drugs: Secondary | ICD-10-CM

## 2023-05-28 DIAGNOSIS — E1142 Type 2 diabetes mellitus with diabetic polyneuropathy: Secondary | ICD-10-CM

## 2023-05-28 DIAGNOSIS — Z794 Long term (current) use of insulin: Secondary | ICD-10-CM | POA: Diagnosis not present

## 2023-05-28 DIAGNOSIS — E11649 Type 2 diabetes mellitus with hypoglycemia without coma: Secondary | ICD-10-CM | POA: Diagnosis not present

## 2023-05-28 LAB — POCT GLYCOSYLATED HEMOGLOBIN (HGB A1C): HbA1c, POC (controlled diabetic range): 11.5 % — AB (ref 0.0–7.0)

## 2023-05-28 MED ORDER — FREESTYLE LIBRE 3 PLUS SENSOR MISC
5 refills | Status: DC
  Filled 2023-05-28: qty 2, 30d supply, fill #0
  Filled 2023-09-26: qty 2, 30d supply, fill #1
  Filled 2023-11-20: qty 2, 30d supply, fill #2

## 2023-05-28 MED ORDER — FREESTYLE LIBRE 3 READER DEVI
1.0000 | 0 refills | Status: DC
  Filled 2023-05-28: qty 1, 365d supply, fill #0

## 2023-05-28 MED ORDER — OZEMPIC (0.25 OR 0.5 MG/DOSE) 2 MG/3ML ~~LOC~~ SOPN
0.2500 mg | PEN_INJECTOR | SUBCUTANEOUS | 1 refills | Status: DC
  Filled 2023-05-28: qty 3, 56d supply, fill #0

## 2023-05-28 MED ORDER — OZEMPIC (0.25 OR 0.5 MG/DOSE) 2 MG/3ML ~~LOC~~ SOPN
0.5000 mg | PEN_INJECTOR | SUBCUTANEOUS | 1 refills | Status: DC
  Filled 2023-05-28: qty 3, 28d supply, fill #0
  Filled 2023-06-26: qty 3, 28d supply, fill #1

## 2023-05-28 NOTE — Progress Notes (Signed)
S:     No chief complaint on file.  72 y.o. male who presents for hypertension evaluation, education, and management.  PMH is significant for T2DM, HTN, HLD, CKD Stage III.  Patient was referred and last seen by Primary Care Provider, Dr. Alvis Lemmings, on 04/23/23.   At last visit with Dr. Alvis Lemmings, patient had not been checking BG at home due to concerns with the accuracy of his glucometer, but reported sugars as high as 500-600. A new RX for testing supplies was sent in and his Lantus was increased from 35 to 38 units daily. Glipizide and metformin were continued.   Today, patient presents to clinic in good spirits. Reports adherence to medications as prescribed. Does admit sometimes he runs out of medications due to delays in filling at the pharmacy, but he has not been out of medication in the past week. He works from 1:00 to 9:30 PM daily and eats a large meal when he gets home from work. Checks his BG first thing in the morning and before bed at night. No longer having issues with glucometer.  Family/Social History:  Former smoker No alcohol use  Current diabetes medications include: glipizide 10 mg twice daily, Lantus 38 units daily (PM) Current hypertension medications include: valsartan-hydrochlorothiazide 320-25 mg daily, carvedilol 3.125 mg twice daily, amlodipine 10 mg daily Current hyperlipidemia medications include: atorvastatin 40 mg daily  Patient reports adherence to taking all medications as prescribed.   Insurance coverage: Medicare  Patient denies hypoglycemic events.  Patient denies nocturia (nighttime urination).  Patient denies neuropathy (nerve pain). Patient denies visual changes. Patient reports self foot exams.   Patient reported dietary habits: Eats 1-2 meals/day Breakfast: sometimes  Lunch: toasted bread, green tea with honey  Dinner: brown rice, fufu, plantains, chicken, fish, spinach, peppers Snacks: does not snack much Drinks: only water  O:    ROS  Physical Exam  Per glucometer: 2 week average: 224 11/18 - AM 194 11/17 - AM 258, PM 424, 11/16 - 256 11/15 - AM 277, PM 188 11/14 - PM 253 11/13- AM 268, PM 297 11/12 - PM 187, 265 11/11 - AM 117, PM 232  Lab Results  Component Value Date   HGBA1C 11.5 (A) 05/28/2023   There were no vitals filed for this visit.  Lipid Panel     Component Value Date/Time   CHOL 158 01/17/2022 0929   TRIG 67 01/17/2022 0929   HDL 57 01/17/2022 0929   CHOLHDL 3.0 11/16/2020 1042   CHOLHDL 2.8 02/29/2016 1018   VLDL 12 02/29/2016 1018   LDLCALC 88 01/17/2022 0929    Clinical Atherosclerotic Cardiovascular Disease (ASCVD): No  The 10-year ASCVD risk score (Arnett DK, et al., 2019) is: 32.5%   Values used to calculate the score:     Age: 87 years     Sex: Male     Is Non-Hispanic African American: Yes     Diabetic: Yes     Tobacco smoker: No     Systolic Blood Pressure: 125 mmHg     Is BP treated: Yes     HDL Cholesterol: 57 mg/dL     Total Cholesterol: 158 mg/dL   Patient is participating in a Managed Medicaid Plan:  No  A/P: Diabetes longstanding currently uncontrolled but improving with A1c 11.5% today (down from 14.3% prior). Patient is able to verbalize appropriate hypoglycemia management plan. Medication adherence appears appropriate. He eats a culturally appropriate diet that is high in carbohydrates, but has tried to make  small adjustments like switching to brown rice and incorporating vegetables. Control is suboptimal due to need for additional drug therapy. Will increase Lantus and start Ozempic today. Also shifted Lantus from PM to AM administration. His largest meal of the day is dinner so AM administration will ensure he has adequate insulin coverage at dinner time. Previously has had issues with cost of medications. Pharmacy test claim showed Ozempic is $47 for first 6 weeks of medication. Ozempic patient assistance is closed for the remainder of 2024, but will  reopen in 2025. He reported he will be able to afford a one time co-pay of $47 which will last him until 2025 when we can apply for assistance and if approved, he would be able to get Ozempic for free. Also discussed setting him up with a Libre CGM to help with BG assessments and d/t higher risk of hypoglycemia given his age and use of insulin with glipizide. Will continue glipizide as he is tolerating this well. Requested auto-refills from the pharmacy downstairs to prevent delays in filling to ensure he does not run out of medications. -Increased dose of basal insulin Lantus from 38 to 42 units daily and moved administration to the morning.  -Started GLP-1 Ozempic (semaglutide) 0.25 mg weekly x4 weeks then increase to 0.5 mg weekly. -Continued glipizide 10 mg twice daily.  -Patient educated on purpose, proper use, and potential adverse effects of Ozempic, Lantus, and glipizide.  -Extensively discussed pathophysiology of diabetes, recommended lifestyle interventions, dietary effects on blood sugar control.  -Counseled on s/sx of and management of hypoglycemia.  -Next A1c anticipated 08/28/23.   ASCVD risk - primary prevention in patient with diabetes. Last LDL is 73 close to goal of <70 mg/dL. ASCVD risk factors include T2DM, HTN and 10-year ASCVD risk score of 32%. high intensity statin indicated.  -Continued atorvastatin 40 mg.   Written patient instructions provided. Patient verbalized understanding of treatment plan.  Total time in face to face counseling 40 minutes.    Follow-up:  Pharmacist in 1 month PCP clinic visit 07/24/23  Patient seen with Erasmo Leventhal, Pharmacy Student  Jarrett Ables, PharmD PGY-1 Pharmacy Resident

## 2023-05-31 ENCOUNTER — Other Ambulatory Visit: Payer: Self-pay

## 2023-06-01 ENCOUNTER — Other Ambulatory Visit: Payer: Self-pay

## 2023-06-04 ENCOUNTER — Other Ambulatory Visit: Payer: Self-pay

## 2023-06-11 ENCOUNTER — Other Ambulatory Visit: Payer: Self-pay

## 2023-06-13 ENCOUNTER — Other Ambulatory Visit: Payer: Self-pay

## 2023-06-15 ENCOUNTER — Other Ambulatory Visit: Payer: Self-pay

## 2023-06-18 ENCOUNTER — Other Ambulatory Visit: Payer: Self-pay

## 2023-06-22 ENCOUNTER — Other Ambulatory Visit: Payer: Self-pay

## 2023-06-26 ENCOUNTER — Ambulatory Visit: Payer: Medicare Other | Attending: Family Medicine | Admitting: Pharmacist

## 2023-06-26 ENCOUNTER — Encounter: Payer: Self-pay | Admitting: Pharmacist

## 2023-06-26 ENCOUNTER — Other Ambulatory Visit: Payer: Self-pay

## 2023-06-26 DIAGNOSIS — Z7984 Long term (current) use of oral hypoglycemic drugs: Secondary | ICD-10-CM | POA: Diagnosis not present

## 2023-06-26 DIAGNOSIS — E1142 Type 2 diabetes mellitus with diabetic polyneuropathy: Secondary | ICD-10-CM

## 2023-06-26 DIAGNOSIS — Z794 Long term (current) use of insulin: Secondary | ICD-10-CM

## 2023-06-26 DIAGNOSIS — Z7985 Long-term (current) use of injectable non-insulin antidiabetic drugs: Secondary | ICD-10-CM

## 2023-06-26 DIAGNOSIS — E11649 Type 2 diabetes mellitus with hypoglycemia without coma: Secondary | ICD-10-CM | POA: Diagnosis not present

## 2023-06-26 MED ORDER — GLIPIZIDE 10 MG PO TABS
5.0000 mg | ORAL_TABLET | Freq: Two times a day (BID) | ORAL | 1 refills | Status: DC
  Filled 2023-06-26: qty 90, 90d supply, fill #0

## 2023-06-26 NOTE — Progress Notes (Signed)
S:     No chief complaint on file.  72 y.o. male who presents for hypertension evaluation, education, and management.  PMH is significant for T2DM, HTN, HLD, CKD Stage III.  Patient was referred and last seen by Primary Care Provider, Dr. Alvis Lemmings, on 04/23/23. Pharmacy saw him 05/28/2023. A1c was 11.5 (down from 14.3% prior). We added Ozempic to his regimen.  Today, patient presents to clinic in good spirits. Since adding Ozempic, pt reports tolerating well. Denies any NV, abdominal pain. No changes in vision.  Reports adherence to medications as prescribed. Does admit sometimes he runs out of medications due to delays in filling at the pharmacy, but he has not been out of medication in the past week. He works from 1:00 to 9:30 PM daily and eats a large meal when he gets home from work. Checks his BG first thing in the morning and before bed at night. No longer having issues with glucometer.  Family/Social History:  Former smoker No alcohol use  Current diabetes medications include: glipizide 10 mg twice daily, Lantus 42 units daily, Ozempic 0.5 mg weekly Current hypertension medications include: valsartan-hydrochlorothiazide 320-25 mg daily, carvedilol 3.125 mg twice daily, amlodipine 10 mg daily Current hyperlipidemia medications include: atorvastatin 40 mg daily  Patient reports adherence to taking all medications as prescribed.  Fill histories of DM medications:  -Glipizide: 04/16/23 (90-day supply) -Lantus: 05/31/2023 (23-day supply) -Ozempic: 05/31/2023 (28-day supply)  Insurance coverage: Medicare  Patient endorses hypoglycemic events since starting Ozempic. I note several objective readings <70. Lowest reading is 48 mg/dL. Pt is treating successfully.   Patient denies nocturia (nighttime urination).  Patient denies neuropathy (nerve pain). Patient denies visual changes. Patient reports self foot exams.   Patient reported dietary habits: Eats 1-2 meals/day Breakfast:  sometimes  Lunch: toasted bread, green tea with honey  Dinner: brown rice, fufu, plantains, chicken, fish, spinach, peppers Snacks: does not snack much Drinks: only water  O:   Brings in his Accu Chek Guide for review:  Per glucometer: 30 day average: 141  Lab Results  Component Value Date   HGBA1C 11.5 (A) 05/28/2023   There were no vitals filed for this visit.  Lipid Panel     Component Value Date/Time   CHOL 158 01/17/2022 0929   TRIG 67 01/17/2022 0929   HDL 57 01/17/2022 0929   CHOLHDL 3.0 11/16/2020 1042   CHOLHDL 2.8 02/29/2016 1018   VLDL 12 02/29/2016 1018   LDLCALC 88 01/17/2022 0929    Clinical Atherosclerotic Cardiovascular Disease (ASCVD): No  The 10-year ASCVD risk score (Arnett DK, et al., 2019) is: 32.5%   Values used to calculate the score:     Age: 28 years     Sex: Male     Is Non-Hispanic African American: Yes     Diabetic: Yes     Tobacco smoker: No     Systolic Blood Pressure: 125 mmHg     Is BP treated: Yes     HDL Cholesterol: 57 mg/dL     Total Cholesterol: 158 mg/dL   Patient is participating in a Managed Medicaid Plan:  No  A/P: Diabetes longstanding currently uncontrolled but improving. Ozempic is lowering patient's exogenous insulin requirement. We will decrease glipizide with a goal of removing if hypoglycemia persists. Patient is able to verbalize appropriate hypoglycemia management plan. Medication adherence appears appropriate.  - Continue Lantus 42 units daily and moved administration to the morning.  -Increase Ozempic (semaglutide) to 0.5 mg weekly. -Decrease glipizide to  5 mg twice daily. Pt verbalizes understanding to take 1/2 of the 10mg  tablets on hand. -Patient educated on purpose, proper use, and potential adverse effects of Ozempic, Lantus, and glipizide.  -Extensively discussed pathophysiology of diabetes, recommended lifestyle interventions, dietary effects on blood sugar control.  -Counseled on s/sx of and management of  hypoglycemia.  -Next A1c anticipated 08/28/23.   Written patient instructions provided. Patient verbalized understanding of treatment plan.  Total time in face to face counseling 40 minutes.    Follow-up:  -PCP clinic visit 07/24/23. -Me in Feb.  Butch Penny, PharmD, P[place]y [place], CPP Clinical Pharmacist [facility] [phone]

## 2023-06-28 DIAGNOSIS — N281 Cyst of kidney, acquired: Secondary | ICD-10-CM | POA: Diagnosis not present

## 2023-06-28 DIAGNOSIS — N183 Chronic kidney disease, stage 3 unspecified: Secondary | ICD-10-CM | POA: Diagnosis not present

## 2023-06-29 ENCOUNTER — Telehealth: Payer: Self-pay

## 2023-06-29 ENCOUNTER — Other Ambulatory Visit: Payer: Self-pay

## 2023-06-29 NOTE — Telephone Encounter (Signed)
Received notification from [employer] regarding approval for OZEMPIC. Patient assistance approved from 07/11/2023 to 07/09/2024.  Medication will ship to [facility] at [facility]  Pt ID: [id number]  Company phone: [phone]

## 2023-07-02 ENCOUNTER — Other Ambulatory Visit: Payer: Self-pay

## 2023-07-03 ENCOUNTER — Other Ambulatory Visit: Payer: Self-pay

## 2023-07-16 ENCOUNTER — Other Ambulatory Visit: Payer: Self-pay

## 2023-07-16 ENCOUNTER — Other Ambulatory Visit: Payer: Self-pay | Admitting: Family Medicine

## 2023-07-16 MED ORDER — GLUCOSE BLOOD VI STRP
1.0000 | ORAL_STRIP | Freq: Three times a day (TID) | 2 refills | Status: DC
  Filled 2023-07-16: qty 100, 34d supply, fill #0

## 2023-07-20 ENCOUNTER — Encounter: Payer: Self-pay | Admitting: Family Medicine

## 2023-07-20 DIAGNOSIS — R972 Elevated prostate specific antigen [PSA]: Secondary | ICD-10-CM | POA: Insufficient documentation

## 2023-07-24 ENCOUNTER — Encounter: Payer: Self-pay | Admitting: Family Medicine

## 2023-07-24 ENCOUNTER — Ambulatory Visit: Payer: Medicare Other | Attending: Family Medicine | Admitting: Family Medicine

## 2023-07-24 ENCOUNTER — Other Ambulatory Visit: Payer: Self-pay

## 2023-07-24 VITALS — BP 121/65 | HR 81 | Ht 64.0 in | Wt 143.8 lb

## 2023-07-24 DIAGNOSIS — I152 Hypertension secondary to endocrine disorders: Secondary | ICD-10-CM

## 2023-07-24 DIAGNOSIS — E785 Hyperlipidemia, unspecified: Secondary | ICD-10-CM

## 2023-07-24 DIAGNOSIS — I129 Hypertensive chronic kidney disease with stage 1 through stage 4 chronic kidney disease, or unspecified chronic kidney disease: Secondary | ICD-10-CM

## 2023-07-24 DIAGNOSIS — Z7985 Long-term (current) use of injectable non-insulin antidiabetic drugs: Secondary | ICD-10-CM | POA: Diagnosis not present

## 2023-07-24 DIAGNOSIS — E1169 Type 2 diabetes mellitus with other specified complication: Secondary | ICD-10-CM | POA: Diagnosis not present

## 2023-07-24 DIAGNOSIS — E1122 Type 2 diabetes mellitus with diabetic chronic kidney disease: Secondary | ICD-10-CM

## 2023-07-24 DIAGNOSIS — E11649 Type 2 diabetes mellitus with hypoglycemia without coma: Secondary | ICD-10-CM | POA: Diagnosis not present

## 2023-07-24 DIAGNOSIS — N1832 Chronic kidney disease, stage 3b: Secondary | ICD-10-CM | POA: Diagnosis not present

## 2023-07-24 DIAGNOSIS — Z7984 Long term (current) use of oral hypoglycemic drugs: Secondary | ICD-10-CM | POA: Diagnosis not present

## 2023-07-24 DIAGNOSIS — E1159 Type 2 diabetes mellitus with other circulatory complications: Secondary | ICD-10-CM

## 2023-07-24 DIAGNOSIS — Z794 Long term (current) use of insulin: Secondary | ICD-10-CM

## 2023-07-24 LAB — POCT GLYCOSYLATED HEMOGLOBIN (HGB A1C): HbA1c, POC (controlled diabetic range): 8.7 % — AB (ref 0.0–7.0)

## 2023-07-24 MED ORDER — VALSARTAN-HYDROCHLOROTHIAZIDE 320-25 MG PO TABS
1.0000 | ORAL_TABLET | Freq: Every day | ORAL | 1 refills | Status: DC
  Filled 2023-07-24 – 2023-08-05 (×2): qty 90, 90d supply, fill #0
  Filled 2023-11-17 – 2023-11-19 (×3): qty 90, 90d supply, fill #1

## 2023-07-24 MED ORDER — CARVEDILOL 3.125 MG PO TABS
3.1250 mg | ORAL_TABLET | Freq: Two times a day (BID) | ORAL | 1 refills | Status: DC
  Filled 2023-07-24 – 2023-11-05 (×2): qty 180, 90d supply, fill #0

## 2023-07-24 MED ORDER — AMLODIPINE BESYLATE 10 MG PO TABS
10.0000 mg | ORAL_TABLET | Freq: Every day | ORAL | 1 refills | Status: DC
  Filled 2023-07-24 – 2023-08-24 (×2): qty 90, 90d supply, fill #0
  Filled 2023-11-26: qty 90, 90d supply, fill #1

## 2023-07-24 MED ORDER — GLUCOSE BLOOD VI STRP: 1.0000 | ORAL_STRIP | Freq: Three times a day (TID) | 2 refills | Status: DC

## 2023-07-24 MED ORDER — OZEMPIC (0.25 OR 0.5 MG/DOSE) 2 MG/3ML ~~LOC~~ SOPN
0.5000 mg | PEN_INJECTOR | SUBCUTANEOUS | 1 refills | Status: DC
  Filled 2023-07-24 (×2): qty 3, 28d supply, fill #0

## 2023-07-24 MED ORDER — ATORVASTATIN CALCIUM 40 MG PO TABS
40.0000 mg | ORAL_TABLET | Freq: Every day | ORAL | 1 refills | Status: DC
  Filled 2023-07-24 – 2023-09-26 (×2): qty 90, 90d supply, fill #0
  Filled 2023-12-24: qty 90, 90d supply, fill #1

## 2023-07-24 NOTE — Patient Instructions (Signed)
 VISIT SUMMARY:  You came in today for a routine follow-up for your diabetes management. Your blood sugar control has improved, with your A1c dropping from 11.5 to 8.7. You are following your medication regimen well and have been monitoring your blood glucose levels at home. You also mentioned a recent visit to the urologist, who found your prostate level to be normal for your age.  YOUR PLAN:  -TYPE 2 DIABETES MELLITUS: Type 2 Diabetes Mellitus is a condition where your body does not use insulin  properly, leading to high blood sugar levels. Your blood sugar control has improved, with your A1c dropping from 11.5 to 8.7. Continue your current medications: Lantus  42 units daily, Ozempic  once weekly, and Glipizide  5mg  twice daily. We will check your fasting lipid panel, kidney function, and A1c. Please follow up in 3 months.  -MEDICATION REFILL: You mentioned that you are running out of glucose test strips. We have sent a new prescription for glucose test strips to your pharmacy.  INSTRUCTIONS:  Please follow up in 3 months for a routine check-up. In the meantime, continue monitoring your blood glucose levels at home and take your medications as prescribed. We will also check your fasting lipid panel, kidney function, and A1c at your next visit.

## 2023-07-24 NOTE — Progress Notes (Signed)
 Subjective:  Patient ID: Jerry Serrano, male    DOB: August 20, 1950  Age: 73 y.o. MRN: [id number]  CC: Medical Management of Chronic Issues (Hyperglycemic episode over the weekend.)   HPI Jerry Serrano is a 73 y.o. year old male with a history of hypertension, hyperlipidemia, type 2 diabetes mellitus (A1c 10.2), stage III CKD who presents today for a  follow-up visit.     Interval History: Discussed the use of AI scribe software for clinical note transcription with the patient, who gave verbal consent to proceed.   The patient, with a history of diabetes, presents for a routine follow-up. He reports compliance with his current medication regimen, which includes Lantus  42 units daily, Ozempic  once weekly, and Glipizide  5mg  twice daily. He has been regularly seeing the pharmacist for medication management. His most recent A1c was 8.7, down from 11.5, indicating improved glycemic control. The patient checks his blood glucose levels at home. The lowest recent reading was 71, at which point he consumed orange juice to prevent hypoglycemia. The highest recent reading was 125. He denies symptoms of hypoglycemia such as shakiness or sweating.  He also sees [place] for management of his CKD.  His blood pressure is normal.  Additionally, the patient recently saw a urologist for an elevated prostate level. The urologist deemed the level normal for the patient's age with no suspicion for prostate cancer.  And did not schedule a follow-up.        Past Medical History:  Diagnosis Date   Allergy    Arthritis    Diabetes mellitus    Hyperlipidemia    Hypertension     Past Surgical History:  Procedure Laterality Date   HERNIA REPAIR      Family History  Problem Relation Age of Onset   Colon cancer Neg Hx    Esophageal cancer Neg Hx    Rectal cancer Neg Hx    Stomach cancer Neg Hx     Social History   Socioeconomic History   Marital status: Married    Spouse  name: Not on file   Number of children: Not on file   Years of education: Not on file   Highest education level: Not on file  Occupational History   Not on file  Tobacco Use   Smoking status: Former    Types: Cigarettes   Smokeless tobacco: Never   Tobacco comments:    over 30 years ago  Vaping Use   Vaping status: Never Used  Substance and Sexual Activity   Alcohol use: No   Drug use: No   Sexual activity: Not on file  Other Topics Concern   Not on file  Social History Narrative   Not on file   Social Drivers of Health   Financial Resource Strain: Low Risk  (01/24/2023)   Overall Financial Resource Strain (CARDIA)    Difficulty of Paying Living Expenses: Not hard at all  Food Insecurity: No Food Insecurity (01/24/2023)   Hunger Vital Sign    Worried About Running Out of Food in the Last Year: Never true    Ran Out of Food in the Last Year: Never true  Transportation Needs: No Transportation Needs (01/24/2023)   PRAPARE - Administrator, Civil Service (Medical): No    Lack of Transportation (Non-Medical): No  Physical Activity: Sufficiently Active (01/24/2023)   Exercise Vital Sign    Days of Exercise per Week: 5 days    Minutes of Exercise per  Session: 30 min  Stress: No Stress Concern Present (01/24/2023)   [employer] of Occupational Health - Occupational Stress Questionnaire    Feeling of Stress : Not at all  Social Connections: Moderately Integrated (01/24/2023)   Social Connection and Isolation Panel [NHANES]    Frequency of Communication with Friends and Family: More than three times a week    Frequency of Social Gatherings with Friends and Family: Three times a week    Attends Religious Services: 1 to 4 times per year    Active Member of Clubs or Organizations: Yes    Attends Banker Meetings: More than 4 times per year    Marital Status: Widowed    No Known Allergies  Outpatient Medications Prior to Visit  Medication Sig  Dispense Refill   Accu-Chek Softclix Lancets lancets Use to check blood sugar 3 times daily. 100 each 6   aspirin  EC 81 MG tablet Take 1 tablet (81 mg total) by mouth daily. 30 tablet 3   betamethasone  dipropionate 0.05 % cream APPLY EXTERNALLY TO THE AFFECTED AREA TWICE DAILY 30 g 0   Blood Glucose Monitoring Suppl (ACCU-CHEK GUIDE) w/Device KIT Use as directed four times daily. 1 kit 0   Continuous Glucose Receiver (FREESTYLE LIBRE 3 READER) DEVI Use as directed. 1 each 0   Continuous Glucose Sensor (FREESTYLE LIBRE 3 PLUS SENSOR) MISC Change sensor every 15 days. 2 each 5   gabapentin  (NEURONTIN ) 300 MG capsule Take 1 capsule (300 mg total) by mouth daily. 90 capsule 1   glipiZIDE  (GLUCOTROL ) 10 MG tablet Take 0.5 tablets (5 mg total) by mouth 2 (two) times daily before a meal. 90 tablet 1   insulin  glargine (LANTUS ) 100 UNIT/ML Solostar Pen Inject 38 Units into the skin daily. 30 mL 6   ketoconazole  (NIZORAL ) 2 % cream Apply 1 application topically daily. 15 g 0   Lancet Devices (ACCU-CHEK SOFTCLIX) lancets Use as instructed 1 each 12   olopatadine (PATANOL) 0.1 % ophthalmic solution Place 1 drop into both eyes 2 (two) times daily.     pantoprazole  (PROTONIX ) 40 MG tablet Take 1 tablet (40 mg total) by mouth daily. 90 tablet 1   sildenafil  (VIAGRA ) 50 MG tablet Take 1 tablet (50 mg total) by mouth daily as needed for erectile dysfunction. 10 tablet 1   traMADol  (ULTRAM ) 50 MG tablet Take 1 tablet (50 mg total) by mouth at bedtime as needed. 30 tablet 1   amLODipine  (NORVASC ) 10 MG tablet Take 1 tablet (10 mg total) by mouth daily. 90 tablet 1   atorvastatin  (LIPITOR) 40 MG tablet Take 1 tablet (40 mg total) by mouth daily. 90 tablet 1   carvedilol  (COREG ) 3.125 MG tablet Take 1 tablet (3.125 mg total) by mouth 2 (two) times daily with a meal. 180 tablet 1   glucose blood test strip Test in the morning, at noon, and at bedtime. 300 strip 2   Semaglutide ,0.25 or 0.5MG /DOS, (OZEMPIC , 0.25 OR 0.5  MG/DOSE,) 2 MG/3ML SOPN Inject 0.5 mg into the skin once a week. 3 mL 1   valsartan -hydrochlorothiazide  (DIOVAN -HCT) 320-25 MG tablet Take 1 tablet by mouth daily. 90 tablet 1   Facility-Administered Medications Prior to Visit  Medication Dose Route Frequency Provider Last Rate Last Admin   0.9 %  sodium chloride  infusion  500 mL Intravenous Continuous Danis, Victory CROME III, MD         ROS Review of Systems  Constitutional:  Negative for activity change and appetite change.  HENT:  Negative for sinus pressure and sore throat.   Respiratory:  Negative for chest tightness, shortness of breath and wheezing.   Cardiovascular:  Negative for chest pain and palpitations.  Gastrointestinal:  Negative for abdominal distention, abdominal pain and constipation.  Genitourinary: Negative.   Musculoskeletal: Negative.   Psychiatric/Behavioral:  Negative for behavioral problems and dysphoric mood.    Objective:  BP 121/65   Pulse 81   Ht 5' 4 (1.626 m)   Wt 143 lb 12.8 oz (65.2 kg)   SpO2 99%   BMI 24.68 kg/m      07/24/2023    9:44 AM 04/23/2023    2:42 PM 04/23/2023    2:14 PM  BP/Weight  Systolic BP 121 125 145  Diastolic BP 65 71 75  Wt. (Lbs) 143.8  146.2  BMI 24.68 kg/m2  25.1 kg/m2      Physical Exam Constitutional:      Appearance: He is well-developed.  Cardiovascular:     Rate and Rhythm: Normal rate.     Heart sounds: Normal heart sounds. No murmur heard. Pulmonary:     Effort: Pulmonary effort is normal.     Breath sounds: Normal breath sounds. No wheezing or rales.  Chest:     Chest wall: No tenderness.  Abdominal:     General: Bowel sounds are normal. There is no distension.     Palpations: Abdomen is soft. There is no mass.     Tenderness: There is no abdominal tenderness.  Musculoskeletal:        General: Normal range of motion.     Right lower leg: No edema.     Left lower leg: No edema.  Neurological:     Mental Status: He is alert and oriented to  person, place, and time.  Psychiatric:        Mood and Affect: Mood normal.        Latest Ref Rng & Units 03/05/2023   11:25 AM 09/26/2022   10:27 AM 04/25/2022    9:23 AM  CMP  Glucose 70 - 99 mg/dL 665  890  784   BUN 8 - 27 mg/dL 32  29  31   Creatinine 0.76 - 1.27 mg/dL 8.12  7.84  7.73   Sodium 134 - 144 mmol/L 134  139  138   Potassium 3.5 - 5.2 mmol/L 5.0  4.8  4.5   Chloride 96 - 106 mmol/L 99  104  101   CO2 20 - 29 mmol/L 21  17  20    Calcium  8.6 - 10.2 mg/dL 9.8  9.8  89.6   Total Protein 6.0 - 8.5 g/dL 7.3  7.3  7.4   Total Bilirubin 0.0 - 1.2 mg/dL 0.4  0.5  0.3   Alkaline Phos 44 - 121 IU/L 198  100  141   AST 0 - 40 IU/L 23  23  21    ALT 0 - 44 IU/L 37  26  25     Lipid Panel     Component Value Date/Time   CHOL 158 01/17/2022 0929   TRIG 67 01/17/2022 0929   HDL 57 01/17/2022 0929   CHOLHDL 3.0 11/16/2020 1042   CHOLHDL 2.8 02/29/2016 1018   VLDL 12 02/29/2016 1018   LDLCALC 88 01/17/2022 0929    CBC    Component Value Date/Time   WBC 6.1 07/18/2019 1153   WBC 7.6 05/05/2013 1116   RBC 4.73 07/18/2019 1153   RBC 4.82 05/05/2013 1116  HGB 12.6 (L) 07/18/2019 1153   HCT 37.6 07/18/2019 1153   PLT 368 07/18/2019 1153   MCV 80 07/18/2019 1153   MCH 26.6 07/18/2019 1153   MCH 28.0 05/05/2013 1116   MCHC 33.5 07/18/2019 1153   MCHC 35.7 05/05/2013 1116   RDW 17.0 (H) 07/18/2019 1153   LYMPHSABS 2.0 07/18/2019 1153   MONOABS 0.7 05/05/2013 1116   EOSABS 0.4 07/18/2019 1153   BASOSABS 0.1 07/18/2019 1153    Lab Results  Component Value Date   HGBA1C 8.7 (A) 07/24/2023    Assessment & Plan:      Type 2 Diabetes Mellitus Improved glycemic control with A1c 8.7% from 11.5%. Patient is adherent to insulin  and oral hypoglycemic medications. No symptoms of hypoglycemia reported. Home glucose monitoring shows values ranging from 71 to 125 mg/dL. -Continue current regimen of Lantus  42 units daily, Ozempic  once weekly, and Glipizide  5mg  twice  daily. -Check fasting lipid panel, renal function, and A1c. -Follow-up in 3 months.   Hypertension in stage III CKD secondary to type 2 diabetes mellitus Blood pressure is normal -Continue antihypertensives -Continue to follow-up with nephrology -Avoid nephrotoxins -Will check renal function today   Hyperlipidemia -Controlled -Continue statin  Medication Refill Patient reports running out of glucose test strips. -Resend prescription for glucose test strips to the pharmacy.          Meds ordered this encounter  Medications   glucose blood test strip    Sig: Test in the morning, at noon, and at bedtime.    Dispense:  300 strip    Refill:  2    Refill per insurance coverage   amLODipine  (NORVASC ) 10 MG tablet    Sig: Take 1 tablet (10 mg total) by mouth daily.    Dispense:  90 tablet    Refill:  1   atorvastatin  (LIPITOR) 40 MG tablet    Sig: Take 1 tablet (40 mg total) by mouth daily.    Dispense:  90 tablet    Refill:  1   carvedilol  (COREG ) 3.125 MG tablet    Sig: Take 1 tablet (3.125 mg total) by mouth 2 (two) times daily with a meal.    Dispense:  180 tablet    Refill:  1   Semaglutide ,0.25 or 0.5MG /DOS, (OZEMPIC , 0.25 OR 0.5 MG/DOSE,) 2 MG/3ML SOPN    Sig: Inject 0.5 mg into the skin once a week.    Dispense:  3 mL    Refill:  1   valsartan -hydrochlorothiazide  (DIOVAN -HCT) 320-25 MG tablet    Sig: Take 1 tablet by mouth daily.    Dispense:  90 tablet    Refill:  1    Follow-up: Return in about 3 months (around 10/22/2023) for Chronic medical conditions.       Corrina Sabin, MD, FAAFP. [facility] [place], [place] [phone]   07/24/2023, 10:18 AM

## 2023-07-25 ENCOUNTER — Other Ambulatory Visit: Payer: Self-pay

## 2023-07-25 ENCOUNTER — Other Ambulatory Visit: Payer: Self-pay | Admitting: Family Medicine

## 2023-07-25 LAB — CMP14+EGFR
ALT: 24 [IU]/L (ref 0–44)
AST: 24 [IU]/L (ref 0–40)
Albumin: 4.4 g/dL (ref 3.8–4.8)
Alkaline Phosphatase: 100 [IU]/L (ref 44–121)
BUN/Creatinine Ratio: 18 (ref 10–24)
BUN: 44 mg/dL — ABNORMAL HIGH (ref 8–27)
Bilirubin Total: 0.4 mg/dL (ref 0.0–1.2)
CO2: 20 mmol/L (ref 20–29)
Calcium: 9.8 mg/dL (ref 8.6–10.2)
Chloride: 100 mmol/L (ref 96–106)
Creatinine, Ser: 2.38 mg/dL — ABNORMAL HIGH (ref 0.76–1.27)
Globulin, Total: 2.9 g/dL (ref 1.5–4.5)
Glucose: 116 mg/dL — ABNORMAL HIGH (ref 70–99)
Potassium: 5.5 mmol/L — ABNORMAL HIGH (ref 3.5–5.2)
Sodium: 136 mmol/L (ref 134–144)
Total Protein: 7.3 g/dL (ref 6.0–8.5)
eGFR: 28 mL/min/{1.73_m2} — ABNORMAL LOW (ref >59)

## 2023-07-25 LAB — LP+NON-HDL CHOLESTEROL
Cholesterol, Total: 145 mg/dL (ref 100–199)
HDL: 48 mg/dL (ref >39)
LDL Chol Calc (NIH): 82 mg/dL (ref 0–99)
Total Non-HDL-Chol (LDL+VLDL): 97 mg/dL (ref 0–129)
Triglycerides: 79 mg/dL (ref 0–149)
VLDL Cholesterol Cal: 15 mg/dL (ref 5–40)

## 2023-07-25 MED ORDER — SODIUM POLYSTYRENE SULFONATE 15 GM/60ML CO SUSP
15.0000 g | Freq: Once | 0 refills | Status: AC
  Filled 2023-07-25: qty 60, 1d supply, fill #0

## 2023-07-31 ENCOUNTER — Other Ambulatory Visit: Payer: Self-pay

## 2023-08-06 ENCOUNTER — Other Ambulatory Visit: Payer: Self-pay

## 2023-08-07 ENCOUNTER — Other Ambulatory Visit: Payer: Self-pay

## 2023-08-07 DIAGNOSIS — H25813 Combined forms of age-related cataract, bilateral: Secondary | ICD-10-CM | POA: Diagnosis not present

## 2023-08-07 DIAGNOSIS — E119 Type 2 diabetes mellitus without complications: Secondary | ICD-10-CM | POA: Diagnosis not present

## 2023-08-07 DIAGNOSIS — H1045 Other chronic allergic conjunctivitis: Secondary | ICD-10-CM | POA: Diagnosis not present

## 2023-08-07 DIAGNOSIS — H40023 Open angle with borderline findings, high risk, bilateral: Secondary | ICD-10-CM | POA: Diagnosis not present

## 2023-08-13 NOTE — Progress Notes (Signed)
 S:     No chief complaint on file.  73 y.o. male who presents for diabetes evaluation, education, and management.  PMH is significant for T2DM, HTN, HLD, CKD Stage III.  Patient was referred and last seen by Primary Care Provider, Dr. Delbert, on 07/24/23. A1c was 8.7. Pharmacy last saw him 06/26/2023. At that visit, we increased Ozempic  to 0.5 mg and decreased glipizide  to 5 mg BID.  Patient reports adherence to taking all medications as prescribed. He is doing well today with no complaints. Denies any NV, abdominal pain since increasing Ozempic  dose.   Family/Social History:  Former smoker No alcohol use  Current diabetes medications include: glipizide  5 mg twice daily, Lantus  42 units daily, Ozempic  0.5 mg weekly Current hypertension medications include: valsartan -hydrochlorothiazide  320-25 mg daily, carvedilol  3.125 mg twice daily, amlodipine  10 mg daily Current hyperlipidemia medications include: atorvastatin  40 mg daily  Patient reports hypoglycemic events. Carries green tea incase of hypoglycemic events and treats successfully..  Reported home fasting blood sugars: 58-65  Reported 2 hour post-meal/random blood sugars: 120-180. (Reports taking a reading 17min-1 hr after eating).  Patient reports denies (nighttime urination).  Patient denies neuropathy (nerve pain). Patient denies visual changes. Patient reports self foot exams. Everyday.  Patient reported dietary habits: Eats 1-2 meals/day Breakfast: sometimes  Lunch: toasted bread, green tea with honey  Dinner: brown rice, fufu, plantains, chicken, fish, spinach, peppers Snacks: does not snack much Drinks: only water Removed salt from diet. Only eats sweets when sugar is low.  Patient-reported exercise habits: no formal exercise outside of work  O:   Lab Results  Component Value Date   HGBA1C 8.7 (A) 07/24/2023   There were no vitals filed for this visit.  Lipid Panel     Component Value Date/Time   CHOL  145 07/24/2023 1031   TRIG 79 07/24/2023 1031   HDL 48 07/24/2023 1031   CHOLHDL 3.0 11/16/2020 1042   CHOLHDL 2.8 02/29/2016 1018   VLDL 12 02/29/2016 1018   LDLCALC 82 07/24/2023 1031    Clinical Atherosclerotic Cardiovascular Disease (ASCVD): No  The 10-year ASCVD risk score (Arnett DK, et al., 2019) is: 31.6%   Values used to calculate the score:     Age: 35 years     Sex: Male     Is Non-Hispanic African American: Yes     Diabetic: Yes     Tobacco smoker: No     Systolic Blood Pressure: 121 mmHg     Is BP treated: Yes     HDL Cholesterol: 48 mg/dL     Total Cholesterol: 145 mg/dL    A/P: Diabetes longstanding and currently uncontrolled, but improving. Patient is able to verbalize appropriate hypoglycemia management plan. Medication adherence appears optimal.  -Increased Ozempic  from 0.5 mg to 1 mg in 2 weeks. Reports understanding  that he should take 0.5 mg Ozempic  for the next 2 weeks before starting 1 mg to complete his 1 month cycle. -Continue Lantus  42 mg. -Decreased glipizide  to 5 mg (half of the 10mg  tablet) once in the morning.  -Patient educated on purpose, proper use, and potential adverse effects of Ozempic .  -Extensively discussed pathophysiology of diabetes, recommended lifestyle interventions, dietary effects on blood sugar control.  -Counseled on s/sx of and management of hypoglycemia.  -Next A1c anticipated 10/2023.   ASCVD risk - primary  prevention in patient with diabetes. Last LDL is 82 not at goal of <70 mg/dL. high intensity statin indicated.  -Continued atorvastatin  40 mg.  Written patient instructions provided. Patient verbalized understanding of treatment plan.  Total time in face to face counseling 30 minutes.    Follow-up:  Pharmacist 09/18/2023 PCP clinic visit in 10/23/2023  Patient seen with  Margretta Fear, PharmD Candidate [employer] of 2027  Herlene Fleeta Morris, PharmD, Bernice, CPP Clinical Pharmacist [facility] [phone]

## 2023-08-14 ENCOUNTER — Ambulatory Visit: Payer: Medicare Other | Attending: Family Medicine | Admitting: Pharmacist

## 2023-08-14 ENCOUNTER — Encounter: Payer: Self-pay | Admitting: Pharmacist

## 2023-08-14 ENCOUNTER — Other Ambulatory Visit: Payer: Self-pay

## 2023-08-14 ENCOUNTER — Other Ambulatory Visit: Payer: Self-pay | Admitting: Family Medicine

## 2023-08-14 DIAGNOSIS — M19249 Secondary osteoarthritis, unspecified hand: Secondary | ICD-10-CM

## 2023-08-14 DIAGNOSIS — Z794 Long term (current) use of insulin: Secondary | ICD-10-CM

## 2023-08-14 DIAGNOSIS — Z7984 Long term (current) use of oral hypoglycemic drugs: Secondary | ICD-10-CM | POA: Diagnosis not present

## 2023-08-14 DIAGNOSIS — Z7985 Long-term (current) use of injectable non-insulin antidiabetic drugs: Secondary | ICD-10-CM

## 2023-08-14 DIAGNOSIS — E1142 Type 2 diabetes mellitus with diabetic polyneuropathy: Secondary | ICD-10-CM

## 2023-08-14 MED ORDER — GLIPIZIDE 10 MG PO TABS
5.0000 mg | ORAL_TABLET | Freq: Every day | ORAL | 0 refills | Status: DC
  Filled 2023-08-14: qty 45, 90d supply, fill #0

## 2023-08-14 MED ORDER — SEMAGLUTIDE (1 MG/DOSE) 4 MG/3ML ~~LOC~~ SOPN
1.0000 mg | PEN_INJECTOR | SUBCUTANEOUS | 2 refills | Status: DC
  Filled 2023-08-14: qty 3, 28d supply, fill #0
  Filled 2023-09-18: qty 3, 28d supply, fill #1
  Filled 2023-11-01: qty 3, 28d supply, fill #2

## 2023-08-14 MED ORDER — TRAMADOL HCL 50 MG PO TABS
50.0000 mg | ORAL_TABLET | Freq: Every evening | ORAL | 1 refills | Status: DC | PRN
  Filled 2023-08-14 – 2023-08-28 (×2): qty 30, 30d supply, fill #0
  Filled 2023-10-22: qty 30, 30d supply, fill #1

## 2023-08-15 ENCOUNTER — Other Ambulatory Visit: Payer: Self-pay

## 2023-08-24 ENCOUNTER — Other Ambulatory Visit: Payer: Self-pay

## 2023-08-27 ENCOUNTER — Other Ambulatory Visit: Payer: Self-pay

## 2023-08-28 ENCOUNTER — Other Ambulatory Visit: Payer: Self-pay

## 2023-08-29 ENCOUNTER — Other Ambulatory Visit: Payer: Self-pay

## 2023-09-04 ENCOUNTER — Other Ambulatory Visit: Payer: Self-pay

## 2023-09-14 ENCOUNTER — Telehealth: Payer: Self-pay | Admitting: Family Medicine

## 2023-09-14 NOTE — Telephone Encounter (Signed)
 Contacted pt confirm appt!

## 2023-09-17 ENCOUNTER — Other Ambulatory Visit: Payer: Self-pay

## 2023-09-18 ENCOUNTER — Encounter: Payer: Self-pay | Admitting: Pharmacist

## 2023-09-18 ENCOUNTER — Ambulatory Visit: Payer: Self-pay | Attending: Family Medicine | Admitting: Pharmacist

## 2023-09-18 ENCOUNTER — Other Ambulatory Visit: Payer: Self-pay

## 2023-09-18 DIAGNOSIS — Z794 Long term (current) use of insulin: Secondary | ICD-10-CM | POA: Diagnosis not present

## 2023-09-18 DIAGNOSIS — Z7985 Long-term (current) use of injectable non-insulin antidiabetic drugs: Secondary | ICD-10-CM

## 2023-09-18 DIAGNOSIS — E11649 Type 2 diabetes mellitus with hypoglycemia without coma: Secondary | ICD-10-CM | POA: Diagnosis not present

## 2023-09-18 DIAGNOSIS — Z7984 Long term (current) use of oral hypoglycemic drugs: Secondary | ICD-10-CM | POA: Diagnosis not present

## 2023-09-18 NOTE — Progress Notes (Addendum)
 S:     No chief complaint on file.  73 y.o. male who presents for diabetes evaluation, education, and management.  PMH is significant for T2DM, HTN, HLD, CKD Stage III.  Patient was referred and last seen by Primary Care Provider, Dr. Alvis Lemmings, on 07/24/23. A1c was 8.7. Pharmacy last saw him 08/14/2023. At that visit, pt reported good adherence to Ozempic with no side effects. We encouraged him to complete two more weeks of Ozempic at the 0.5 mg dose and then increase to the 1mg  dose. We also decreased his glipizide dose to 5 mg only once in the morning (down from 5mg  BID).  Patient reports adherence to taking all medications as prescribed. He is doing well today with no complaints. Denies any NV, abdominal pain since increasing Ozempic dose.   Family/Social History:  Former smoker No alcohol use  Current diabetes medications include: glipizide 5 mg daily, Lantus 42 units daily, Ozempic 1 mg weekly Current hypertension medications include: valsartan-hydrochlorothiazide 320-25 mg daily, carvedilol 3.125 mg twice daily, amlodipine 10 mg daily Current hyperlipidemia medications include: atorvastatin 40 mg daily  Patient reports a decreased number of hypoglycemic events since decreasing glipizide to 5mg  once a day in the morning. Carries green tea incase of hypoglycemic events and treats successfully.  Patient reports denies (nighttime urination).  Patient denies neuropathy (nerve pain). Patient denies visual changes. Patient reports self foot exams. Everyday.  Patient reported dietary habits: Eats 1-2 meals/day Breakfast: sometimes  Lunch: toasted bread, green tea with honey  Dinner: brown rice, fufu, plantains, chicken, fish, spinach, peppers Snacks: does not snack much Drinks: only water Removed salt from diet. Only eats sweets when sugar is low.  Patient-reported exercise habits: no formal exercise outside of work  O:  Chief Executive Officer for review: 30-day avg: 117  Range  as follows: 58 - 191   Lab Results  Component Value Date   HGBA1C 8.7 (A) 07/24/2023   There were no vitals filed for this visit.  Lipid Panel     Component Value Date/Time   CHOL 145 07/24/2023 1031   TRIG 79 07/24/2023 1031   HDL 48 07/24/2023 1031   CHOLHDL 3.0 11/16/2020 1042   CHOLHDL 2.8 02/29/2016 1018   VLDL 12 02/29/2016 1018   LDLCALC 82 07/24/2023 1031    Clinical Atherosclerotic Cardiovascular Disease (ASCVD): No  The 10-year ASCVD risk score (Arnett DK, et al., 2019) is: 31.6%   Values used to calculate the score:     Age: 21 years     Sex: Male     Is Non-Hispanic African American: Yes     Diabetic: Yes     Tobacco smoker: No     Systolic Blood Pressure: 121 mmHg     Is BP treated: Yes     HDL Cholesterol: 48 mg/dL     Total Cholesterol: 145 mg/dL    A/P: Diabetes longstanding and currently uncontrolled, but improving. Patient is able to verbalize appropriate hypoglycemia management plan. While hypoglycemia persists, he endorses decreased frequency of events. Furthermore, these events only occur if he goes a prolonged time without eating.   For now, we can leave the glipizide, Ozempic, and Lantus where it is. In the future, if A1c decreases to goalin 1 month and he continues to have even rare hypoglycemia, I recommend to further increase Ozempic to 2mg  weekly and stop glipizide at that time. Again, for now he can continue glipizide and Ozempic. Medication adherence appears optimal.   -Continued Ozempic  1 mg. -Continue Lantus 42 mg. -Continued glipizide 5 mg (half of the 10mg  tablet) once in the morning.  -Patient educated on purpose, proper use, and potential adverse effects of Ozempic.  -Extensively discussed pathophysiology of diabetes, recommended lifestyle interventions, dietary effects on blood sugar control.  -Counseled on s/sx of and management of hypoglycemia.  -Next A1c anticipated 10/2023. .   Written patient instructions provided. Patient  verbalized understanding of treatment plan.  Total time in face to face counseling 30 minutes.    Follow-up:  PCP clinic visit in 10/23/2023. Me in 1 month.  Butch Penny, PharmD, Patsy [place], CPP Clinical Pharmacist [facility] [phone]

## 2023-09-19 ENCOUNTER — Telehealth: Payer: Self-pay | Admitting: Family Medicine

## 2023-09-19 NOTE — Telephone Encounter (Signed)
 Noted.

## 2023-09-19 NOTE — Telephone Encounter (Signed)
 A document form has been faxed:  Ozempic dose increase form , to be filled out by provider. Document is located in providers tray at front office. Please place in Jerry Serrano's bin at nurse's station

## 2023-09-26 ENCOUNTER — Other Ambulatory Visit: Payer: Self-pay

## 2023-09-26 ENCOUNTER — Other Ambulatory Visit: Payer: Self-pay | Admitting: Family Medicine

## 2023-09-26 DIAGNOSIS — K219 Gastro-esophageal reflux disease without esophagitis: Secondary | ICD-10-CM

## 2023-09-26 MED ORDER — PANTOPRAZOLE SODIUM 40 MG PO TBEC
40.0000 mg | DELAYED_RELEASE_TABLET | Freq: Every day | ORAL | 1 refills | Status: DC
  Filled 2023-09-26: qty 90, 90d supply, fill #0

## 2023-09-28 ENCOUNTER — Other Ambulatory Visit: Payer: Self-pay

## 2023-10-03 ENCOUNTER — Other Ambulatory Visit: Payer: Self-pay

## 2023-10-16 DIAGNOSIS — N281 Cyst of kidney, acquired: Secondary | ICD-10-CM | POA: Diagnosis not present

## 2023-10-16 DIAGNOSIS — N1832 Chronic kidney disease, stage 3b: Secondary | ICD-10-CM | POA: Diagnosis not present

## 2023-10-16 DIAGNOSIS — I10 Essential (primary) hypertension: Secondary | ICD-10-CM | POA: Diagnosis not present

## 2023-10-16 DIAGNOSIS — I129 Hypertensive chronic kidney disease with stage 1 through stage 4 chronic kidney disease, or unspecified chronic kidney disease: Secondary | ICD-10-CM | POA: Diagnosis not present

## 2023-10-16 DIAGNOSIS — E1122 Type 2 diabetes mellitus with diabetic chronic kidney disease: Secondary | ICD-10-CM | POA: Diagnosis not present

## 2023-10-16 DIAGNOSIS — E875 Hyperkalemia: Secondary | ICD-10-CM | POA: Diagnosis not present

## 2023-10-17 ENCOUNTER — Other Ambulatory Visit: Payer: Self-pay

## 2023-10-17 LAB — LAB REPORT - SCANNED
Creatinine, POC: 31.5 mg/dL
EGFR: 32

## 2023-10-22 ENCOUNTER — Other Ambulatory Visit: Payer: Self-pay

## 2023-10-23 ENCOUNTER — Other Ambulatory Visit: Payer: Self-pay

## 2023-10-23 ENCOUNTER — Ambulatory Visit: Payer: Medicare Other | Admitting: Family Medicine

## 2023-10-24 ENCOUNTER — Other Ambulatory Visit: Payer: Self-pay

## 2023-10-24 ENCOUNTER — Ambulatory Visit: Attending: Family Medicine | Admitting: Family Medicine

## 2023-10-24 ENCOUNTER — Encounter: Payer: Self-pay | Admitting: Family Medicine

## 2023-10-24 VITALS — BP 126/70 | HR 78 | Wt 145.8 lb

## 2023-10-24 DIAGNOSIS — E1122 Type 2 diabetes mellitus with diabetic chronic kidney disease: Secondary | ICD-10-CM

## 2023-10-24 DIAGNOSIS — Z794 Long term (current) use of insulin: Secondary | ICD-10-CM

## 2023-10-24 DIAGNOSIS — N183 Chronic kidney disease, stage 3 unspecified: Secondary | ICD-10-CM | POA: Diagnosis not present

## 2023-10-24 DIAGNOSIS — I10 Essential (primary) hypertension: Secondary | ICD-10-CM | POA: Diagnosis not present

## 2023-10-24 DIAGNOSIS — Z7985 Long-term (current) use of injectable non-insulin antidiabetic drugs: Secondary | ICD-10-CM

## 2023-10-24 DIAGNOSIS — K219 Gastro-esophageal reflux disease without esophagitis: Secondary | ICD-10-CM | POA: Diagnosis not present

## 2023-10-24 DIAGNOSIS — Z7984 Long term (current) use of oral hypoglycemic drugs: Secondary | ICD-10-CM | POA: Diagnosis not present

## 2023-10-24 LAB — POCT GLYCOSYLATED HEMOGLOBIN (HGB A1C): HbA1c, POC (controlled diabetic range): 6.9 % (ref 0.0–7.0)

## 2023-10-24 LAB — GLUCOSE, POCT (MANUAL RESULT ENTRY): POC Glucose: 96 mg/dL (ref 70–99)

## 2023-10-24 MED ORDER — GABAPENTIN 300 MG PO CAPS
300.0000 mg | ORAL_CAPSULE | Freq: Every day | ORAL | 1 refills | Status: DC
Start: 2023-10-24 — End: 2024-03-19
  Filled 2023-10-24: qty 90, 90d supply, fill #0
  Filled 2024-03-12: qty 90, 90d supply, fill #1

## 2023-10-24 MED ORDER — GLIPIZIDE 5 MG PO TABS: 5.0000 mg | ORAL_TABLET | Freq: Every day | ORAL | Status: DC

## 2023-10-24 MED ORDER — INSULIN GLARGINE 100 UNIT/ML SOLOSTAR PEN: 42.0000 [IU] | PEN_INJECTOR | Freq: Every day | SUBCUTANEOUS | Status: DC

## 2023-10-24 NOTE — Patient Instructions (Signed)
 VISIT SUMMARY:  You came in today for a medication refill and discussed your current health status. You reported no dizziness or low blood sugar. You are planning to travel out of the country from June to September and will need a three-month supply of medications. Your kidney function has returned to normal, and you have no current symptoms of heartburn.  YOUR PLAN:  -TYPE 2 DIABETES MELLITUS: Type 2 diabetes is a condition where your body does not use insulin properly, leading to high blood sugar levels. Your A1c is at 6.9%, indicating good control. Continue taking glipizide 5 mg once daily, insulin glargine 42 units daily, and semaglutide (Ozempic) 1 mg weekly. Please monitor your blood glucose levels regularly.  -HYPERTENSION: Hypertension is high blood pressure. Your blood pressure is now normal at 126/70 mmHg. Please monitor your blood pressure regularly and ensure you take your medications as prescribed.  -CHRONIC KIDNEY DISEASE (CKD): Chronic kidney disease is a condition where your kidneys are damaged and can't filter blood as well as they should. Your kidney function has improved, with a creatinine level of 2.14 mg/dL and stable potassium at 5.0 mmol/L. Continue with your current management and monitoring of kidney function.  -PERIPHERAL NEUROPATHY: Peripheral neuropathy is a condition that results in numbness, tingling, or pain in the hands and feet. You are using gabapentin for this condition and are low on refills. We have refilled your gabapentin prescription.  -GASTROESOPHAGEAL REFLUX DISEASE (GERD): GERD is a condition where stomach acid frequently flows back into the tube connecting your mouth and stomach, causing heartburn. You have no current symptoms, so you can discontinue pantoprazole. If heartburn symptoms recur, you may take pantoprazole as needed.  -MEDICATION MANAGEMENT FOR TRAVEL: You will be traveling out of the country from June to September and will need a three-month  supply of medications. Please call one week before your travel to request this supply.  INSTRUCTIONS:  Please call one week before your travel to request a three-month supply of medications. Schedule a follow-up appointment six months from now.

## 2023-10-24 NOTE — Progress Notes (Signed)
 Subjective:  Patient ID: Jerry Serrano, male    DOB: Jul 05, 1951  Age: 73 y.o. MRN: [id number]  CC: Medical Management of Chronic Issues     Discussed the use of AI scribe software for clinical note transcription with the patient, who gave verbal consent to proceed.  History of Present Illness The patient, with a history ofhypertension, hyperlipidemia, type 2 diabetes mellitus, stage III CKD  presents for a medication refill. He reports no dizziness or hypoglycemia. He is planning to travel out of the country from June to September and will need a three-month supply of medications.   He is currently taking gabapentin daily for numbness in the hands and feet, glipizide Ozempic and insulin for diabetes, and his A1c is 6.9 which has improved from 8.7 previously. He was previously taking pantoprazole for heartburn, but reports no current symptoms and will discontinue this medication. He recently saw his nephrologist and reports that his kidney function has improved.  I have reviewed his recent labs from [facility] which reveals creatinine of 2.14 down from 2.38 in 07/2023.  He also had a urine protein creatinine ratio done. His blood pressure is controlled and he endorses adherence with his antihypertensive.    Past Medical History:  Diagnosis Date   Allergy    Arthritis    Diabetes mellitus    Hyperlipidemia    Hypertension     Past Surgical History:  Procedure Laterality Date   HERNIA REPAIR      Family History  Problem Relation Age of Onset   Colon cancer Neg Hx    Esophageal cancer Neg Hx    Rectal cancer Neg Hx    Stomach cancer Neg Hx     Social History   Socioeconomic History   Marital status: Married    Spouse name: Not on file   Number of children: Not on file   Years of education: Not on file   Highest education level: Not on file  Occupational History   Not on file  Tobacco Use   Smoking status: Former    Types: Cigarettes   Smokeless  tobacco: Never   Tobacco comments:    over 30 years ago  Vaping Use   Vaping status: Never Used  Substance and Sexual Activity   Alcohol use: No   Drug use: No   Sexual activity: Not on file  Other Topics Concern   Not on file  Social History Narrative   Not on file   Social Drivers of Health   Financial Resource Strain: Low Risk  (01/24/2023)   Overall Financial Resource Strain (CARDIA)    Difficulty of Paying Living Expenses: Not hard at all  Food Insecurity: No Food Insecurity (01/24/2023)   Hunger Vital Sign    Worried About Running Out of Food in the Last Year: Never true    Ran Out of Food in the Last Year: Never true  Transportation Needs: No Transportation Needs (01/24/2023)   PRAPARE - Administrator, Civil Service (Medical): No    Lack of Transportation (Non-Medical): No  Physical Activity: Sufficiently Active (01/24/2023)   Exercise Vital Sign    Days of Exercise per Week: 5 days    Minutes of Exercise per Session: 30 min  Stress: No Stress Concern Present (01/24/2023)   [employer] of Occupational Health - Occupational Stress Questionnaire    Feeling of Stress : Not at all  Social Connections: Moderately Integrated (01/24/2023)   Social Connection and Isolation Panel [  NHANES]    Frequency of Communication with Friends and Family: More than three times a week    Frequency of Social Gatherings with Friends and Family: Three times a week    Attends Religious Services: 1 to 4 times per year    Active Member of Clubs or Organizations: Yes    Attends Banker Meetings: More than 4 times per year    Marital Status: Widowed    No Known Allergies  Outpatient Medications Prior to Visit  Medication Sig Dispense Refill   Accu-Chek Softclix Lancets lancets Use to check blood sugar 3 times daily. 100 each 6   amLODipine (NORVASC) 10 MG tablet Take 1 tablet (10 mg total) by mouth daily. 90 tablet 1   aspirin EC 81 MG tablet Take 1 tablet (81 mg  total) by mouth daily. 30 tablet 3   atorvastatin (LIPITOR) 40 MG tablet Take 1 tablet (40 mg total) by mouth daily. 90 tablet 1   betamethasone dipropionate 0.05 % cream APPLY EXTERNALLY TO THE AFFECTED AREA TWICE DAILY 30 g 0   Blood Glucose Monitoring Suppl (ACCU-CHEK GUIDE) w/Device KIT Use as directed four times daily. 1 kit 0   carvedilol (COREG) 3.125 MG tablet Take 1 tablet (3.125 mg total) by mouth 2 (two) times daily with a meal. 180 tablet 1   Continuous Glucose Receiver (FREESTYLE LIBRE 3 READER) DEVI Use as directed. 1 each 0   Continuous Glucose Sensor (FREESTYLE LIBRE 3 PLUS SENSOR) MISC Change sensor every 15 days. 2 each 5   gabapentin (NEURONTIN) 300 MG capsule Take 1 capsule (300 mg total) by mouth daily. 90 capsule 1   glipiZIDE (GLUCOTROL) 10 MG tablet Take 0.5 tablets (5 mg total) by mouth daily before breakfast. 45 tablet 0   glucose blood test strip Test in the morning, at noon, and at bedtime. 300 strip 2   insulin glargine (LANTUS) 100 UNIT/ML Solostar Pen Inject 38 Units into the skin daily. 30 mL 6   ketoconazole (NIZORAL) 2 % cream Apply 1 application topically daily. 15 g 0   Lancet Devices (ACCU-CHEK SOFTCLIX) lancets Use as instructed 1 each 12   olopatadine (PATANOL) 0.1 % ophthalmic solution Place 1 drop into both eyes 2 (two) times daily.     pantoprazole (PROTONIX) 40 MG tablet Take 1 tablet (40 mg total) by mouth daily. 90 tablet 1   Semaglutide, 1 MG/DOSE, 4 MG/3ML SOPN Inject 1 mg as directed once a week. 3 mL 2   sildenafil (VIAGRA) 50 MG tablet Take 1 tablet (50 mg total) by mouth daily as needed for erectile dysfunction. 10 tablet 1   traMADol (ULTRAM) 50 MG tablet Take 1 tablet (50 mg total) by mouth at bedtime as needed. 30 tablet 1   valsartan-hydrochlorothiazide (DIOVAN-HCT) 320-25 MG tablet Take 1 tablet by mouth daily. 90 tablet 1   Facility-Administered Medications Prior to Visit  Medication Dose Route Frequency Provider Last Rate Last Admin    0.9 %  sodium chloride infusion  500 mL Intravenous Continuous Danis, Roel Clarity III, MD         ROS Review of Systems  Constitutional:  Negative for activity change and appetite change.  HENT:  Negative for sinus pressure and sore throat.   Respiratory:  Negative for chest tightness, shortness of breath and wheezing.   Cardiovascular:  Negative for chest pain and palpitations.  Gastrointestinal:  Negative for abdominal distention, abdominal pain and constipation.  Genitourinary: Negative.   Musculoskeletal: Negative.   Psychiatric/Behavioral:  Negative for behavioral problems and dysphoric mood.     Objective:  BP 126/70   Pulse 78   Wt 145 lb 12.8 oz (66.1 kg)   SpO2 98%   BMI 25.03 kg/m      10/24/2023    8:48 AM 10/24/2023    8:38 AM 07/24/2023    9:44 AM  BP/Weight  Systolic BP 126 147 121  Diastolic BP 70 80 65  Wt. (Lbs)  145.8 143.8  BMI  25.03 kg/m2 24.68 kg/m2      Physical Exam Constitutional:      Appearance: He is well-developed.  Cardiovascular:     Rate and Rhythm: Normal rate.     Heart sounds: Normal heart sounds. No murmur heard. Pulmonary:     Effort: Pulmonary effort is normal.     Breath sounds: Normal breath sounds. No wheezing or rales.  Chest:     Chest wall: No tenderness.  Abdominal:     General: Bowel sounds are normal. There is no distension.     Palpations: Abdomen is soft. There is no mass.     Tenderness: There is no abdominal tenderness.  Musculoskeletal:        General: Normal range of motion.     Right lower leg: No edema.     Left lower leg: No edema.  Neurological:     Mental Status: He is alert and oriented to person, place, and time.  Psychiatric:        Mood and Affect: Mood normal.        Latest Ref Rng & Units 07/24/2023   10:31 AM 03/05/2023   11:25 AM 09/26/2022   10:27 AM  CMP  Glucose 70 - 99 mg/dL 409  811  914   BUN 8 - 27 mg/dL 44  32  29   Creatinine 0.76 - 1.27 mg/dL 7.82  9.56  2.13   Sodium 134 - 144  mmol/L 136  134  139   Potassium 3.5 - 5.2 mmol/L 5.5  5.0  4.8   Chloride 96 - 106 mmol/L 100  99  104   CO2 20 - 29 mmol/L 20  21  17    Calcium 8.6 - 10.2 mg/dL 9.8  9.8  9.8   Total Protein 6.0 - 8.5 g/dL 7.3  7.3  7.3   Total Bilirubin 0.0 - 1.2 mg/dL 0.4  0.4  0.5   Alkaline Phos 44 - 121 IU/L 100  198  100   AST 0 - 40 IU/L 24  23  23    ALT 0 - 44 IU/L 24  37  26     Lipid Panel     Component Value Date/Time   CHOL 145 07/24/2023 1031   TRIG 79 07/24/2023 1031   HDL 48 07/24/2023 1031   CHOLHDL 3.0 11/16/2020 1042   CHOLHDL 2.8 02/29/2016 1018   VLDL 12 02/29/2016 1018   LDLCALC 82 07/24/2023 1031    CBC    Component Value Date/Time   WBC 6.1 07/18/2019 1153   WBC 7.6 05/05/2013 1116   RBC 4.73 07/18/2019 1153   RBC 4.82 05/05/2013 1116   HGB 12.6 (L) 07/18/2019 1153   HCT 37.6 07/18/2019 1153   PLT 368 07/18/2019 1153   MCV 80 07/18/2019 1153   MCH 26.6 07/18/2019 1153   MCH 28.0 05/05/2013 1116   MCHC 33.5 07/18/2019 1153   MCHC 35.7 05/05/2013 1116   RDW 17.0 (H) 07/18/2019 1153   LYMPHSABS 2.0 07/18/2019 1153  MONOABS 0.7 05/05/2013 1116   EOSABS 0.4 07/18/2019 1153   BASOSABS 0.1 07/18/2019 1153    Lab Results  Component Value Date   HGBA1C 6.9 10/24/2023       Assessment & Plan Type 2 Diabetes Mellitus A1c at 6.9% indicates good control. Adjusted glipizide to 5 mg once daily. - Continue glipizide 5 mg once daily. - Continue insulin glargine 42 units daily. - Continue semaglutide (Ozempic) 1 mg weekly. - Monitor blood glucose levels regularly. -Counseled on Diabetic diet, my plate method, 409 minutes of moderate intensity exercise/week Blood sugar logs with fasting goals of 80-120 mg/dl, random of less than 811 and in the event of sugars less than 60 mg/dl or greater than 914 mg/dl encouraged to notify the clinic. Advised on the need for annual eye exams, annual foot exams, Pneumonia vaccine.   Hypertension Blood pressure normalized to  126/70 mmHg after initial elevation. - Monitor blood pressure regularly. - Ensure medication adherence. -Counseled on blood pressure goal of less than 130/80, low-sodium, DASH diet, medication compliance, 150 minutes of moderate intensity exercise per week. Discussed medication compliance, adverse effects.   Chronic Kidney Disease (CKD) stage III Improved kidney function with creatinine at 2.14 mg/dL. 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 - Avoid nephrotoxins - Continue to follow-up with nephrology at [facility]  Peripheral Neuropathy Gabapentin used for numbness in hands and feet. Low on refills. - Refill gabapentin prescription.  Gastroesophageal Reflux Disease (GERD) No current symptoms. Pantoprazole discontinued unless symptoms recur. - Discontinue pantoprazole unless heartburn symptoms recur. - If heartburn occurs, consider taking pantoprazole as needed.  Medication Management for Travel Requires medication refills for travel from June to September. - Call one week before travel to request a three-month supply of medications. - Schedule follow-up appointment six months from now.      No orders of the defined types were placed in this encounter.   Follow-up: No follow-ups on file.       Joaquin Mulberry, MD, FAAFP. [facility] [place], [place] [phone]   10/24/2023, 8:51 AM

## 2023-10-26 ENCOUNTER — Other Ambulatory Visit: Payer: Self-pay

## 2023-11-01 ENCOUNTER — Other Ambulatory Visit: Payer: Self-pay

## 2023-11-05 ENCOUNTER — Other Ambulatory Visit: Payer: Self-pay

## 2023-11-08 ENCOUNTER — Other Ambulatory Visit: Payer: Self-pay

## 2023-11-13 ENCOUNTER — Other Ambulatory Visit: Payer: Self-pay

## 2023-11-18 NOTE — Progress Notes (Unsigned)
 S:     No chief complaint on file.  73 y.o. male who presents for diabetes evaluation, education, and management. PMH is significant for T2DM, HTN, HLD, CKD Stage III.   Patient was referred by Primary Care Provider, Dr. Adan Holms, on 07/24/23. A1c was 8.7. Pharmacy saw him 08/14/2023. At that visit, pt reported good adherence to Ozempic  with no side effects. We encouraged him to complete two more weeks of Ozempic  at the 0.5 mg dose and then increase to the 1mg  dose. We also decreased his glipizide  dose to 5 mg only once in the morning (down from 5mg  BID). At pharmacy follow-up on 09/18/23, A1C had improved from 11.5% to 8.7%. Regimen was continued. At PCP follow-up on 10/24/23, A1C had improved to 6.9% on Ozempic  1 mg, glipizide  5 mg daily, and insulin  glargine 42 units daily.   Patient reports adherence to taking all medications as prescribed. He is doing well today with no complaints. Denies any NV, abdominal pain ***  Increase Ozempic  to 2 mg, stop glipizide  (reduce hypo risk) Labs from [facility], K stable at 5, Scr decreased from 2.38 > 2.14 mg/dL (lab result scan 07/15/08)  Family/Social History:  Former smoker No alcohol use  Current diabetes medications include: glipizide  5 mg daily, Lantus  42 units daily, Ozempic  1 mg weekly Current hypertension medications include: valsartan -hydrochlorothiazide  320-25 mg daily, carvedilol  3.125 mg twice daily, amlodipine  10 mg daily Current hyperlipidemia medications include: atorvastatin  40 mg daily  Patient reports a decreased number of hypoglycemic events since decreasing glipizide  to 5mg  once a day in the morning. Carries green tea incase of hypoglycemic events and treats successfully.  Patient reports denies (nighttime urination).  Patient denies neuropathy (nerve pain). Patient denies visual changes. Patient reports self foot exams. Everyday.  Patient reported dietary habits: Eats 1-2 meals/day Breakfast: sometimes  Lunch: toasted bread, green  tea with honey  Dinner: brown rice, fufu, plantains, chicken, fish, spinach, peppers Snacks: does not snack much Drinks: only water Removed salt from diet. Only eats sweets when sugar is low.  Patient-reported exercise habits: no formal exercise outside of work  O:  Chief Executive Officer for review: 30-day avg: 117  Range as follows: 58 - 191   Lab Results  Component Value Date   HGBA1C 6.9 10/24/2023   There were no vitals filed for this visit.  Lipid Panel     Component Value Date/Time   CHOL 145 07/24/2023 1031   TRIG 79 07/24/2023 1031   HDL 48 07/24/2023 1031   CHOLHDL 3.0 11/16/2020 1042   CHOLHDL 2.8 02/29/2016 1018   VLDL 12 02/29/2016 1018   LDLCALC 82 07/24/2023 1031    Clinical Atherosclerotic Cardiovascular Disease (ASCVD): No  The 10-year ASCVD risk score (Arnett DK, et al., 2019) is: 33.7%   Values used to calculate the score:     Age: 57 years     Sex: Male     Is Non-Hispanic African American: Yes     Diabetic: Yes     Tobacco smoker: No     Systolic Blood Pressure: 126 mmHg     Is BP treated: Yes     HDL Cholesterol: 48 mg/dL     Total Cholesterol: 145 mg/dL    A/P: Diabetes longstanding and currently uncontrolled, but improving. Patient is able to verbalize appropriate hypoglycemia management plan. While hypoglycemia persists, he endorses decreased frequency of events. Furthermore, these events only occur if he goes a prolonged time without eating.   For now, we  can leave the glipizide , Ozempic , and Lantus  where it is. In the future, if A1c decreases to goalin 1 month and he continues to have even rare hypoglycemia, I recommend to further increase Ozempic  to 2mg  weekly and stop glipizide  at that time. Again, for now he can continue glipizide  and Ozempic . Medication adherence appears optimal.   -Continued Ozempic  1 mg. -Continue Lantus  42 mg. -Continued glipizide  5 mg (half of the 10mg  tablet) once in the morning.  -Patient educated on purpose,  proper use, and potential adverse effects of Ozempic .  -Extensively discussed pathophysiology of diabetes, recommended lifestyle interventions, dietary effects on blood sugar control.  -Counseled on s/sx of and management of hypoglycemia.  -Next A1c anticipated 10/2023. .   Written patient instructions provided. Patient verbalized understanding of treatment plan.  Total time in face to face counseling 30 minutes.    Follow-up:  Pharmacist *** PCP 04/24/24  Arthea Larsson, PharmD PGY1 Pharmacy Resident  Marene Shape, PharmD, BCACP, CPP Clinical Pharmacist [facility] [phone]

## 2023-11-19 ENCOUNTER — Encounter: Payer: Self-pay | Admitting: Pharmacist

## 2023-11-19 ENCOUNTER — Other Ambulatory Visit: Payer: Self-pay

## 2023-11-19 ENCOUNTER — Ambulatory Visit: Attending: Family Medicine | Admitting: Pharmacist

## 2023-11-19 VITALS — BP 140/84 | HR 86

## 2023-11-19 DIAGNOSIS — Z7984 Long term (current) use of oral hypoglycemic drugs: Secondary | ICD-10-CM | POA: Diagnosis not present

## 2023-11-19 DIAGNOSIS — I10 Essential (primary) hypertension: Secondary | ICD-10-CM

## 2023-11-19 DIAGNOSIS — Z794 Long term (current) use of insulin: Secondary | ICD-10-CM | POA: Diagnosis not present

## 2023-11-19 DIAGNOSIS — Z7985 Long-term (current) use of injectable non-insulin antidiabetic drugs: Secondary | ICD-10-CM

## 2023-11-19 DIAGNOSIS — E11649 Type 2 diabetes mellitus with hypoglycemia without coma: Secondary | ICD-10-CM

## 2023-11-19 MED ORDER — OZEMPIC (2 MG/DOSE) 8 MG/3ML ~~LOC~~ SOPN
2.0000 mg | PEN_INJECTOR | SUBCUTANEOUS | 11 refills | Status: DC
  Filled 2023-11-19: qty 3, 28d supply, fill #0
  Filled 2023-12-24: qty 9, 84d supply, fill #1

## 2023-11-19 MED ORDER — CARVEDILOL 6.25 MG PO TABS
6.2500 mg | ORAL_TABLET | Freq: Two times a day (BID) | ORAL | 3 refills | Status: AC
  Filled 2023-11-19: qty 180, 90d supply, fill #0
  Filled 2023-12-24 – 2024-03-12 (×3): qty 180, 90d supply, fill #1
  Filled 2024-05-20: qty 180, 90d supply, fill #2

## 2023-11-19 NOTE — Patient Instructions (Addendum)
 It was great to see you today.  Please increase Ozempic  to 2 mg once weekly.  Please DECREASE Lantus  (insulin  glargine) to 34 units once daily. If you notice your fasting blood sugars are consistently over 130 mg/dL, you can increase this by 2 units. If you notice frequent blood sugars less than 70 mg/dL please call us  so we can further adjust your medicine.   Please increase carvedilol  to 6.25 mg twice daily

## 2023-11-20 ENCOUNTER — Other Ambulatory Visit: Payer: Self-pay

## 2023-11-26 ENCOUNTER — Other Ambulatory Visit: Payer: Self-pay

## 2023-12-06 ENCOUNTER — Other Ambulatory Visit: Payer: Self-pay

## 2023-12-06 ENCOUNTER — Other Ambulatory Visit: Payer: Self-pay | Admitting: Family Medicine

## 2023-12-06 DIAGNOSIS — E1142 Type 2 diabetes mellitus with diabetic polyneuropathy: Secondary | ICD-10-CM

## 2023-12-06 DIAGNOSIS — Z794 Long term (current) use of insulin: Secondary | ICD-10-CM

## 2023-12-06 MED ORDER — LANTUS SOLOSTAR 100 UNIT/ML ~~LOC~~ SOPN
38.0000 [IU] | PEN_INJECTOR | Freq: Every day | SUBCUTANEOUS | 6 refills | Status: DC
  Filled 2023-12-06: qty 30, 78d supply, fill #0

## 2023-12-06 MED ORDER — LANTUS SOLOSTAR 100 UNIT/ML ~~LOC~~ SOPN
42.0000 [IU] | PEN_INJECTOR | Freq: Every day | SUBCUTANEOUS | 3 refills | Status: AC
Start: 2023-12-06 — End: ?
  Filled 2023-12-06: qty 30, 71d supply, fill #0
  Filled 2023-12-24: qty 30, 71d supply, fill #1
  Filled ????-??-??: fill #1

## 2023-12-07 ENCOUNTER — Other Ambulatory Visit: Payer: Self-pay

## 2023-12-10 ENCOUNTER — Other Ambulatory Visit: Payer: Self-pay | Admitting: Family Medicine

## 2023-12-10 DIAGNOSIS — M19249 Secondary osteoarthritis, unspecified hand: Secondary | ICD-10-CM

## 2023-12-11 ENCOUNTER — Other Ambulatory Visit: Payer: Self-pay

## 2023-12-11 MED ORDER — TRAMADOL HCL 50 MG PO TABS
50.0000 mg | ORAL_TABLET | Freq: Every evening | ORAL | 1 refills | Status: DC | PRN
  Filled 2023-12-11: qty 30, 30d supply, fill #0

## 2023-12-12 ENCOUNTER — Other Ambulatory Visit: Payer: Self-pay

## 2023-12-14 ENCOUNTER — Other Ambulatory Visit: Payer: Self-pay

## 2023-12-23 NOTE — Progress Notes (Signed)
 S:     No chief complaint on file.  73 y.o. male who presents for diabetes evaluation, education, and management. PMH is significant for T2DM, HTN, HLD, CKD Stage III.   Patient was referred by Primary Care Provider, Dr. Adan Holms, on 07/24/23. A1c was 8.7. Pharmacy saw him 08/14/2023. At that visit, pt reported good adherence to Ozempic  with no side effects. We encouraged him to complete two more weeks of Ozempic  at the 0.5 mg dose and then increase to the 1mg  dose. We also decreased his glipizide  dose to 5 mg only once in the morning (down from 5mg  BID). At pharmacy follow-up on 09/18/23, A1C had improved from 11.5% to 8.7%. Regimen was continued. At PCP follow-up on 10/24/23, A1C had improved to 6.9% on Ozempic  1 mg, glipizide  5 mg daily, and insulin  glargine 42 units daily. At last visit with pharmacy, Ozempic  was increased and Lantus  was decreased.   Patient presents in good spirits without assistance. He is doing well today with no complaints. *** Denies any NV, abdominal pain. He continues to take Ozempic  2 mg once weekly.***  He continues to drink a sugary drink throughout the day at work to prevent hypoglycemia. *** He states that he lost his BG meter for ~ 1 mo, so he was not able to check his BG consistently. *** He states that his BP has been SBP ~140s at home, which is higher than usual for him. ***  Family/Social History:  Former smoker No alcohol use  Current diabetes medications include: Lantus  34 units daily, Ozempic  2 mg weekly (Saturdays) Current hypertension medications include: valsartan -hydrochlorothiazide  320-25 mg daily, carvedilol  3.125 mg twice daily, amlodipine  10 mg daily Current hyperlipidemia medications include: atorvastatin  40 mg daily  Patient denies hypoglycemic events recently. He does carry green tea with him throughout the day in case of hypoglycemia events.*** He has a fear of hypoglycemia, given a history of severe hypoglycemia during which his family found that  he had fallen out of the bed due to low blood sugar. He required assistance to bring his BG back to normal levels. Now he always drinks a little bit of a sugary drink before bed to prevent this from happening again.  - On review of his glucometer there was a reading of 53 mg/dL at 11BJ on 4/78/29, prior to his last appt with Dr. Newlin.***  Patient denies (nighttime urination).  Patient denies neuropathy (nerve pain). Patient denies visual changes. Patient reports self foot exams. Everyday.  Patient reported dietary habits: Eats 1-2 meals/day. Sometimes skips meals at work. Reports that he has minimized intake of sugar/carbohydrates. He has also cut back on salt.  Breakfast: sometimes  Lunch: toasted bread, green tea with honey  Dinner: brown rice, fufu, plantains, chicken, fish, spinach, peppers Snacks: does not snack much Drinks: only water  Patient-reported exercise habits: no formal exercise outside of work. Walking all day at work.   O:  Brings [employer] Guide for review: Patient lost his meter for ~1 mo. Only 2 readings from the past month.  30-day avg: 110 *** did he find meter? Get a new one? Checking meter and BP at home?   BP Readings from Last 3 Encounters:  11/19/23 (!) 140/84  10/24/23 126/70  07/24/23 121/65    Lab Results  Component Value Date   HGBA1C 6.9 10/24/2023   There were no vitals filed for this visit.   Lipid Panel     Component Value Date/Time   CHOL 145 07/24/2023 1031  TRIG 79 07/24/2023 1031   HDL 48 07/24/2023 1031   CHOLHDL 3.0 11/16/2020 1042   CHOLHDL 2.8 02/29/2016 1018   VLDL 12 02/29/2016 1018   LDLCALC 82 07/24/2023 1031    Clinical Atherosclerotic Cardiovascular Disease (ASCVD): No  The 10-year ASCVD risk score (Arnett DK, et al., 2019) is: 39.5%   Values used to calculate the score:     Age: 65 years     Clincally relevant sex: Male     Is Non-Hispanic African American: Yes     Diabetic: Yes     Tobacco smoker: No      Systolic Blood Pressure: 140 mmHg     Is BP treated: Yes     HDL Cholesterol: 48 mg/dL     Total Cholesterol: 145 mg/dL    A/P: Diabetes longstanding and currently controlled with last A1C of 6.9% below goal < 7%. Patient is at high risk for hypoglycemia given age and high dose of basal insulin . To reduce risk of hypoglycemia, will maximize Ozempic  and decrease deescalate insulin  today. Patient is able to verbalize appropriate hypoglycemia management plan. Medication adherence appears optimal.  -Continue Ozempic  to 2 mg once weekly. *** -Decrease Lantus  to 34 units daily *** -Patient educated on purpose, proper use, and potential adverse effects of Ozempic .  -Extensively discussed pathophysiology of diabetes, recommended lifestyle interventions, dietary effects on blood sugar control.  -Counseled on s/sx of and management of hypoglycemia.  -Next A1c anticipated 01/2024.   Hypertension: - Currently uncontrolled with clinic reading above goal < 130/80 mmHg, which is consistent with home readings. Pulse is consistently > 80. Patient is on maximum doses of ARB, hydrochlorothiazide , and CCB. Will increase dose of carvedilol  today to address elevated blood pressure. Patient agreeable to plan. *** - Reviewed long term cardiovascular and renal outcomes of uncontrolled blood pressure - Reviewed appropriate blood pressure monitoring technique and reviewed goal blood pressure. Recommended to check home blood pressure and heart rate and bring a log of readings to next appointment - Recommend to increase carvedilol  to 6.25 mg BID*** - Recommend to continue valsartan -hydrochlorothiazide  320-25 mg daily, carvedilol  6.25 mg twice daily, amlodipine  10 mg daily***  Written patient instructions provided. Patient verbalized understanding of treatment plan.  Total time in face to face counseling 30 minutes.    Follow-up:  Pharmacist *** PCP 04/24/24  Juleen Oakland, PharmD PGY1 Pharmacy Resident

## 2023-12-24 ENCOUNTER — Telehealth: Payer: Self-pay | Admitting: Pharmacist

## 2023-12-24 ENCOUNTER — Telehealth: Payer: Self-pay | Admitting: Family Medicine

## 2023-12-24 ENCOUNTER — Encounter: Payer: Self-pay | Admitting: Pharmacist

## 2023-12-24 ENCOUNTER — Ambulatory Visit: Attending: Family Medicine | Admitting: Pharmacist

## 2023-12-24 ENCOUNTER — Other Ambulatory Visit: Payer: Self-pay

## 2023-12-24 ENCOUNTER — Other Ambulatory Visit: Payer: Self-pay | Admitting: Family Medicine

## 2023-12-24 VITALS — BP 116/68 | HR 73

## 2023-12-24 DIAGNOSIS — I10 Essential (primary) hypertension: Secondary | ICD-10-CM | POA: Diagnosis not present

## 2023-12-24 DIAGNOSIS — Z794 Long term (current) use of insulin: Secondary | ICD-10-CM

## 2023-12-24 DIAGNOSIS — Z7985 Long-term (current) use of injectable non-insulin antidiabetic drugs: Secondary | ICD-10-CM | POA: Diagnosis not present

## 2023-12-24 DIAGNOSIS — E1159 Type 2 diabetes mellitus with other circulatory complications: Secondary | ICD-10-CM

## 2023-12-24 DIAGNOSIS — I129 Hypertensive chronic kidney disease with stage 1 through stage 4 chronic kidney disease, or unspecified chronic kidney disease: Secondary | ICD-10-CM

## 2023-12-24 DIAGNOSIS — E11649 Type 2 diabetes mellitus with hypoglycemia without coma: Secondary | ICD-10-CM

## 2023-12-24 MED ORDER — AMLODIPINE BESYLATE 10 MG PO TABS
10.0000 mg | ORAL_TABLET | Freq: Every day | ORAL | 1 refills | Status: AC
  Filled 2023-12-24 – 2024-03-12 (×3): qty 90, 90d supply, fill #0
  Filled 2024-07-04: qty 90, 90d supply, fill #1

## 2023-12-24 MED ORDER — VALSARTAN-HYDROCHLOROTHIAZIDE 320-25 MG PO TABS
1.0000 | ORAL_TABLET | Freq: Every day | ORAL | 1 refills | Status: DC
  Filled 2023-12-24 – 2024-03-12 (×3): qty 90, 90d supply, fill #0
  Filled 2024-06-11: qty 90, 90d supply, fill #1

## 2023-12-24 MED ORDER — LANTUS SOLOSTAR 100 UNIT/ML ~~LOC~~ SOPN
38.0000 [IU] | PEN_INJECTOR | Freq: Every day | SUBCUTANEOUS | 1 refills | Status: DC
  Filled 2023-12-24 – 2024-01-08 (×2): qty 45, 118d supply, fill #0
  Filled 2024-03-12: qty 15, 39d supply, fill #0

## 2023-12-24 MED ORDER — OZEMPIC (2 MG/DOSE) 8 MG/3ML ~~LOC~~ SOPN
2.0000 mg | PEN_INJECTOR | SUBCUTANEOUS | 2 refills | Status: DC
  Filled 2023-12-24 (×2): qty 9, 84d supply, fill #0
  Filled 2024-03-12: qty 3, 28d supply, fill #1

## 2023-12-24 MED ORDER — ACCU-CHEK GUIDE TEST VI STRP
ORAL_STRIP | 6 refills | Status: AC
  Filled 2023-12-24: qty 100, 33d supply, fill #0
  Filled 2024-04-28: qty 100, 33d supply, fill #1
  Filled 2024-07-07: qty 100, 33d supply, fill #2

## 2023-12-24 NOTE — Telephone Encounter (Signed)
 Just a reminder message to call [facility] 01/08/2024 for vacation override for amlodipine , valsartan , carvedilol , and insulin .

## 2023-12-24 NOTE — Telephone Encounter (Signed)
 Noted

## 2023-12-24 NOTE — Telephone Encounter (Addendum)
 Patient has completed the application for disability parking placard. The application has been placed in Dr.Newlin's box for review on 12/24/2023.

## 2024-01-07 ENCOUNTER — Other Ambulatory Visit: Payer: Self-pay

## 2024-01-08 ENCOUNTER — Other Ambulatory Visit: Payer: Self-pay

## 2024-01-08 ENCOUNTER — Encounter: Payer: Self-pay | Admitting: Pharmacist

## 2024-01-15 ENCOUNTER — Other Ambulatory Visit: Payer: Self-pay

## 2024-01-18 ENCOUNTER — Other Ambulatory Visit: Payer: Self-pay

## 2024-01-21 ENCOUNTER — Other Ambulatory Visit: Payer: Self-pay

## 2024-02-11 ENCOUNTER — Other Ambulatory Visit: Payer: Self-pay

## 2024-02-26 ENCOUNTER — Other Ambulatory Visit: Payer: Self-pay

## 2024-02-27 ENCOUNTER — Other Ambulatory Visit: Payer: Self-pay

## 2024-02-29 ENCOUNTER — Other Ambulatory Visit: Payer: Self-pay

## 2024-03-04 ENCOUNTER — Other Ambulatory Visit: Payer: Self-pay

## 2024-03-05 ENCOUNTER — Other Ambulatory Visit: Payer: Self-pay

## 2024-03-12 ENCOUNTER — Other Ambulatory Visit: Payer: Self-pay

## 2024-03-12 ENCOUNTER — Other Ambulatory Visit: Payer: Self-pay | Admitting: Family Medicine

## 2024-03-12 DIAGNOSIS — Z794 Long term (current) use of insulin: Secondary | ICD-10-CM

## 2024-03-13 ENCOUNTER — Other Ambulatory Visit: Payer: Self-pay

## 2024-03-13 NOTE — Telephone Encounter (Signed)
 Unable to refill per protocol, Rx expired. Discontinued 10/24/23.  Requested Prescriptions  Pending Prescriptions Disp Refills   glipiZIDE  (GLUCOTROL ) 10 MG tablet 45 tablet 0    Sig: Take 0.5 tablets (5 mg total) by mouth daily before breakfast.     Endocrinology:  Diabetes - Sulfonylureas Passed - 03/13/2024  9:54 AM      Passed - HBA1C is between 0 and 7.9 and within 180 days    HbA1c, POC (controlled diabetic range)  Date Value Ref Range Status  10/24/2023 6.9 0.0 - 7.0 % Final         Passed - Cr in normal range and within 360 days    Creat  Date Value Ref Range Status  09/05/2016 0.90 0.70 - 1.25 mg/dL Final    Comment:      For patients > or = 75 years of age: The upper reference limit for Creatinine is approximately 13% higher for people identified as African-American.      Creatinine, Ser  Date Value Ref Range Status  07/24/2023 2.38 (H) 0.76 - 1.27 mg/dL Final   Creatinine, POC  Date Value Ref Range Status  10/17/2023 31.5 mg/dL Final    Comment:    Abstracted by HIM   Creatinine, Urine  Date Value Ref Range Status  09/05/2016 378 (H) 20 - 370 mg/dL Final    Comment:    Result repeated and verified. Result confirmed by automatic dilution.          Passed - Valid encounter within last 6 months    Recent Outpatient Visits           2 months ago Type 2 diabetes mellitus with hypoglycemia without coma, with long-term current use of insulin  [facility])   [facility] [place] Fleeta [place], Winslow L, RPH-CPP   3 months ago Type 2 diabetes mellitus with hypoglycemia without coma, with long-term current use of insulin  [facility])   [facility] [place] Fleeta [place], [place] L, RPH-CPP   4 months ago Type 2 diabetes mellitus with diabetic polyneuropathy, with long-term current use of insulin  [facility])   [facility] [place]. [facility] Delbert Clam, MD   5 months ago Type 2 diabetes mellitus with hypoglycemia without coma, with long-term current use of insulin  [facility]   [facility] [place]. [facility] Fleeta Morris, Garnette CROME, RPH-CPP   7 months ago Long term (current) use of insulin  [facility])    [facility] [place]. [facility] Fleeta Morris Garnette CROME, RPH-CPP

## 2024-03-18 ENCOUNTER — Telehealth: Payer: Self-pay | Admitting: Family Medicine

## 2024-03-18 ENCOUNTER — Other Ambulatory Visit: Payer: Self-pay | Admitting: Family Medicine

## 2024-03-18 ENCOUNTER — Other Ambulatory Visit: Payer: Self-pay

## 2024-03-18 DIAGNOSIS — E1169 Type 2 diabetes mellitus with other specified complication: Secondary | ICD-10-CM

## 2024-03-18 NOTE — Telephone Encounter (Signed)
 Can you please check with this patient to see what he is actually taking for diabetes?  From his last visit with Herlene he should only be on Lantus  38 units and Ozempic  2 mg daily.  It looks like he had requested a prescription for glipizide  but this is not on his med list.  Please check with him to confirm so we can send in appropriate refills to prevent his running out of medications.  Thank you.

## 2024-03-18 NOTE — Telephone Encounter (Signed)
Pt confirmed appt 9/10

## 2024-03-19 ENCOUNTER — Ambulatory Visit: Attending: Family Medicine | Admitting: Family Medicine

## 2024-03-19 ENCOUNTER — Telehealth: Payer: Self-pay | Admitting: Family Medicine

## 2024-03-19 ENCOUNTER — Encounter: Payer: Self-pay | Admitting: Family Medicine

## 2024-03-19 ENCOUNTER — Other Ambulatory Visit: Payer: Self-pay

## 2024-03-19 VITALS — BP 130/71 | HR 75 | Ht 64.0 in | Wt 140.4 lb

## 2024-03-19 DIAGNOSIS — E1169 Type 2 diabetes mellitus with other specified complication: Secondary | ICD-10-CM

## 2024-03-19 DIAGNOSIS — Z794 Long term (current) use of insulin: Secondary | ICD-10-CM

## 2024-03-19 DIAGNOSIS — E1122 Type 2 diabetes mellitus with diabetic chronic kidney disease: Secondary | ICD-10-CM

## 2024-03-19 DIAGNOSIS — M19249 Secondary osteoarthritis, unspecified hand: Secondary | ICD-10-CM | POA: Diagnosis not present

## 2024-03-19 DIAGNOSIS — N1832 Chronic kidney disease, stage 3b: Secondary | ICD-10-CM | POA: Diagnosis not present

## 2024-03-19 DIAGNOSIS — E1142 Type 2 diabetes mellitus with diabetic polyneuropathy: Secondary | ICD-10-CM

## 2024-03-19 DIAGNOSIS — I129 Hypertensive chronic kidney disease with stage 1 through stage 4 chronic kidney disease, or unspecified chronic kidney disease: Secondary | ICD-10-CM

## 2024-03-19 DIAGNOSIS — Z23 Encounter for immunization: Secondary | ICD-10-CM | POA: Diagnosis not present

## 2024-03-19 DIAGNOSIS — E785 Hyperlipidemia, unspecified: Secondary | ICD-10-CM

## 2024-03-19 LAB — POCT GLYCOSYLATED HEMOGLOBIN (HGB A1C): HbA1c POC (<> result, manual entry): >15 % (ref 4.0–5.6)

## 2024-03-19 MED ORDER — LANTUS SOLOSTAR 100 UNIT/ML ~~LOC~~ SOPN
40.0000 [IU] | PEN_INJECTOR | Freq: Every day | SUBCUTANEOUS | 1 refills | Status: DC
  Filled 2024-03-19: qty 45, 112d supply, fill #0

## 2024-03-19 MED ORDER — OZEMPIC (0.25 OR 0.5 MG/DOSE) 2 MG/3ML ~~LOC~~ SOPN
0.5000 mg | PEN_INJECTOR | SUBCUTANEOUS | 0 refills | Status: DC
  Filled 2024-03-19: qty 3, 42d supply, fill #0
  Filled 2024-03-19: qty 3, 28d supply, fill #0

## 2024-03-19 MED ORDER — OZEMPIC (0.25 OR 0.5 MG/DOSE) 2 MG/3ML ~~LOC~~ SOPN
0.2500 mg | PEN_INJECTOR | SUBCUTANEOUS | 0 refills | Status: DC
  Filled 2024-03-19: qty 3, 42d supply, fill #0
  Filled 2024-03-19: qty 3, 28d supply, fill #0

## 2024-03-19 MED ORDER — SEMAGLUTIDE (1 MG/DOSE) 4 MG/3ML ~~LOC~~ SOPN
1.0000 mg | PEN_INJECTOR | SUBCUTANEOUS | 3 refills | Status: DC
  Filled 2024-03-19 – 2024-04-29 (×2): qty 3, 28d supply, fill #0
  Filled 2024-05-26: qty 3, 28d supply, fill #1

## 2024-03-19 MED ORDER — GABAPENTIN 300 MG PO CAPS
300.0000 mg | ORAL_CAPSULE | Freq: Every day | ORAL | 1 refills | Status: DC
  Filled 2024-03-19 – 2024-06-17 (×2): qty 90, 90d supply, fill #0

## 2024-03-19 MED ORDER — ATORVASTATIN CALCIUM 40 MG PO TABS
40.0000 mg | ORAL_TABLET | Freq: Every day | ORAL | 1 refills | Status: AC
  Filled 2024-03-19: qty 90, 90d supply, fill #0

## 2024-03-19 MED ORDER — TRAMADOL HCL 50 MG PO TABS
50.0000 mg | ORAL_TABLET | Freq: Every evening | ORAL | 1 refills | Status: DC | PRN
  Filled 2024-03-19: qty 30, 30d supply, fill #0
  Filled 2024-04-28: qty 30, 30d supply, fill #1

## 2024-03-19 NOTE — Telephone Encounter (Signed)
 Please when you are back in the office can you give him a call and check on his blood sugar log and adjust his regimen accordingly.  See my last office note for context.  We had to restart Ozempic  at the lowest dose (due to being off 2 mg of Ozempic  for 2 months) in addition to Lantus  40 units.  The pharmacy had also said he picked up the Ozempic  2 mg just before his trip but apparently forgot it at home.  I want to ensure he is not taking the 2 mg as well as the 0.25 mg of Ozempic .  I discontinued the medications which he brought with him from [place] and just wanted to ensure that he is not hyperglycemic.  Thanks.

## 2024-03-19 NOTE — Patient Instructions (Signed)
 VISIT SUMMARY:  You came in today to discuss your diabetes management after returning from [place]. You had run out of your diabetes medication during your trip and were hospitalized for high blood sugar levels. We reviewed your current medications and made some adjustments to help improve your blood sugar control. We also discussed your kidney function and blood pressure management.  YOUR PLAN:  -TYPE 2 DIABETES MELLITUS WITH POOR GLYCEMIC CONTROL AND DIABETIC POLYNEUROPATHY: Your blood sugar levels have been very high, and you have some nerve damage due to diabetes. We are increasing your Lantus  dose to 40 units nightly and restarting Ozempic  at 0.25 mg weekly on Wednesdays. Please review your medications with Herlene in one week and keep a log of your blood sugar levels so we can adjust your insulin  management accordingly.  -CHRONIC KIDNEY DISEASE STAGE 3: Your kidney function is not normal, and you have had high potassium levels in the past. Controlling your diabetes is important to prevent further kidney damage. We will check your kidney function tests, including potassium, and follow up with your kidney specialist as needed.  -HYPERTENSION: Your blood pressure medications are not all present, and you are currently only taking amlodipine . Continue taking amlodipine , and we will review all your blood pressure medications at your next visit. Please check with your pharmacy for your carvedilol  and valsartan -hydrochlorothiazide  prescriptions.  -GENERAL HEALTH MAINTENANCE: We discussed the importance of getting your flu vaccination.  INSTRUCTIONS:  Please review your medications with Herlene in one week and keep a log of your blood sugar levels. We will adjust your insulin  management based on your blood sugar log. Also, check with your pharmacy for your carvedilol  and valsartan -hydrochlorothiazide  prescriptions. Follow up with your kidney specialist as needed.

## 2024-03-19 NOTE — Progress Notes (Signed)
 Subjective:  Patient ID: Jerry Serrano, male    DOB: 1951-05-25  Age: 73 y.o. MRN: [id number]  CC: Medical Management of Chronic Issues     Discussed the use of AI scribe software for clinical note transcription with the patient, who gave verbal consent to proceed.  History of Present Illness Jerry Serrano is a 73 year old male with a history ofhypertension, hyperlipidemia, type 2 diabetes mellitus, stage III CKD who presents with medication management issues after returning from [place].  He traveled to [place] for two months and ran out of his diabetes medication during the trip. He was unable to refill his prescriptions before traveling due to the pharmacy being closed on [place] Day. In [place], he was hospitalized for three days due to high blood sugar levels and was given medications there, which he continued to take upon returning to the [place] .  Prior to traveling, he was on Lantus  38 units daily and Ozempic  2 mg once a week. He did not take Ozempic  during his time in [place]. He was also taking glipizide  in the past, which had been previously stopped due to low blood sugar levels. Upon return, his A1c has increased to over 15.  He is currently taking Lantus  38 units daily but has not resumed Ozempic  since returning. He takes carvedilol  and valsartan /hydrochlorothiazide  for blood pressure, but these medications were not present at the visit. He only brought amlodipine  with him, which he is currently taking.  He has chronic kidney disease, stage three,His last appointment with the kidney specialist was in April, and his kidney function was abnormal at that time. No current pain or other concerns apart from the medication issues.    Past Medical History:  Diagnosis Date   Allergy    Arthritis    Diabetes mellitus    Hyperlipidemia    Hypertension     Past Surgical History:  Procedure Laterality Date   HERNIA REPAIR      Family History  Problem Relation Age of  Onset   Colon cancer Neg Hx    Esophageal cancer Neg Hx    Rectal cancer Neg Hx    Stomach cancer Neg Hx     Social History   Socioeconomic History   Marital status: Married    Spouse name: Not on file   Number of children: Not on file   Years of education: Not on file   Highest education level: Not on file  Occupational History   Not on file  Tobacco Use   Smoking status: Former    Types: Cigarettes   Smokeless tobacco: Never   Tobacco comments:    over 30 years ago  Vaping Use   Vaping status: Never Used  Substance and Sexual Activity   Alcohol use: No   Drug use: No   Sexual activity: Not on file  Other Topics Concern   Not on file  Social History Narrative   Not on file   Social Drivers of Health   Financial Resource Strain: Low Risk  (01/24/2023)   Overall Financial Resource Strain (CARDIA)    Difficulty of Paying Living Expenses: Not hard at all  Food Insecurity: No Food Insecurity (01/24/2023)   Hunger Vital Sign    Worried About Running Out of Food in the Last Year: Never true    Ran Out of Food in the Last Year: Never true  Transportation Needs: No Transportation Needs (01/24/2023)   PRAPARE - Transportation    Lack of Transportation (  Medical): No    Lack of Transportation (Non-Medical): No  Physical Activity: Sufficiently Active (01/24/2023)   Exercise Vital Sign    Days of Exercise per Week: 5 days    Minutes of Exercise per Session: 30 min  Stress: No Stress Concern Present (01/24/2023)   [employer] of Occupational Health - Occupational Stress Questionnaire    Feeling of Stress : Not at all  Social Connections: Moderately Integrated (01/24/2023)   Social Connection and Isolation Panel    Frequency of Communication with Friends and Family: More than three times a week    Frequency of Social Gatherings with Friends and Family: Three times a week    Attends Religious Services: 1 to 4 times per year    Active Member of Clubs or Organizations: Yes     Attends Banker Meetings: More than 4 times per year    Marital Status: Widowed    No Known Allergies  Outpatient Medications Prior to Visit  Medication Sig Dispense Refill   Accu-Chek Softclix Lancets lancets Use to check blood sugar 3 times daily. 100 each 6   amLODipine  (NORVASC ) 10 MG tablet Take 1 tablet (10 mg total) by mouth daily. 90 tablet 1   aspirin  EC 81 MG tablet Take 1 tablet (81 mg total) by mouth daily. 30 tablet 3   betamethasone  dipropionate 0.05 % cream APPLY EXTERNALLY TO THE AFFECTED AREA TWICE DAILY 30 g 0   Blood Glucose Monitoring Suppl (ACCU-CHEK GUIDE) w/Device KIT Use as directed four times daily. 1 kit 0   carvedilol  (COREG ) 6.25 MG tablet Take 1 tablet (6.25 mg total) by mouth 2 (two) times daily with a meal. 180 tablet 3   glucose blood (ACCU-CHEK GUIDE TEST) test strip Use to check blood sugar 3 times daily. 100 each 6   olopatadine (PATANOL) 0.1 % ophthalmic solution Place 1 drop into both eyes 2 (two) times daily.     sildenafil  (VIAGRA ) 50 MG tablet Take 1 tablet (50 mg total) by mouth daily as needed for erectile dysfunction. 10 tablet 1   valsartan -hydrochlorothiazide  (DIOVAN -HCT) 320-25 MG tablet Take 1 tablet by mouth daily. 90 tablet 1   atorvastatin  (LIPITOR) 40 MG tablet Take 1 tablet (40 mg total) by mouth daily. 90 tablet 1   gabapentin  (NEURONTIN ) 300 MG capsule Take 1 capsule (300 mg total) by mouth daily. 90 capsule 1   insulin  glargine (LANTUS  SOLOSTAR) 100 UNIT/ML Solostar Pen Inject 38 Units into the skin daily. 45 mL 1   traMADol  (ULTRAM ) 50 MG tablet Take 1 tablet (50 mg total) by mouth at bedtime as needed. 30 tablet 1   Semaglutide , 2 MG/DOSE, (OZEMPIC , 2 MG/DOSE,) 8 MG/3ML SOPN Inject 2 mg into the skin once a week. (Patient not taking: Reported on 03/19/2024) 9 mL 2   Facility-Administered Medications Prior to Visit  Medication Dose Route Frequency Provider Last Rate Last Admin   0.9 %  sodium chloride  infusion  500 mL  Intravenous Continuous Danis, Victory CROME III, MD         ROS Review of Systems  Constitutional:  Negative for activity change and appetite change.  HENT:  Negative for sinus pressure and sore throat.   Respiratory:  Negative for chest tightness, shortness of breath and wheezing.   Cardiovascular:  Negative for chest pain and palpitations.  Gastrointestinal:  Negative for abdominal distention, abdominal pain and constipation.  Genitourinary: Negative.   Musculoskeletal: Negative.   Psychiatric/Behavioral:  Negative for behavioral problems and dysphoric mood.  Objective:  BP 130/71   Pulse 75   Ht 5' 4 (1.626 m)   Wt 140 lb 6.4 oz (63.7 kg)   SpO2 95%   BMI 24.10 kg/m      03/19/2024    9:34 AM 12/24/2023    8:43 AM 11/19/2023   11:16 AM  BP/Weight  Systolic BP 130 116 140  Diastolic BP 71 68 84  Wt. (Lbs) 140.4    BMI 24.1 kg/m2        Physical Exam Constitutional:      Appearance: He is well-developed.  Cardiovascular:     Rate and Rhythm: Normal rate.     Heart sounds: Normal heart sounds. No murmur heard. Pulmonary:     Effort: Pulmonary effort is normal.     Breath sounds: Normal breath sounds. No wheezing or rales.  Chest:     Chest wall: No tenderness.  Abdominal:     General: Bowel sounds are normal. There is no distension.     Palpations: Abdomen is soft. There is no mass.     Tenderness: There is no abdominal tenderness.  Musculoskeletal:        General: Normal range of motion.     Right lower leg: No edema.     Left lower leg: No edema.  Neurological:     Mental Status: He is alert and oriented to person, place, and time.  Psychiatric:        Mood and Affect: Mood normal.        Latest Ref Rng & Units 07/24/2023   10:31 AM 03/05/2023   11:25 AM 09/26/2022   10:27 AM  CMP  Glucose 70 - 99 mg/dL 883  665  890   BUN 8 - 27 mg/dL 44  32  29   Creatinine 0.76 - 1.27 mg/dL 7.61  8.12  7.84   Sodium 134 - 144 mmol/L 136  134  139   Potassium 3.5  - 5.2 mmol/L 5.5  5.0  4.8   Chloride 96 - 106 mmol/L 100  99  104   CO2 20 - 29 mmol/L 20  21  17    Calcium  8.6 - 10.2 mg/dL 9.8  9.8  9.8   Total Protein 6.0 - 8.5 g/dL 7.3  7.3  7.3   Total Bilirubin 0.0 - 1.2 mg/dL 0.4  0.4  0.5   Alkaline Phos 44 - 121 IU/L 100  198  100   AST 0 - 40 IU/L 24  23  23    ALT 0 - 44 IU/L 24  37  26     Lipid Panel     Component Value Date/Time   CHOL 145 07/24/2023 1031   TRIG 79 07/24/2023 1031   HDL 48 07/24/2023 1031   CHOLHDL 3.0 11/16/2020 1042   CHOLHDL 2.8 02/29/2016 1018   VLDL 12 02/29/2016 1018   LDLCALC 82 07/24/2023 1031    CBC    Component Value Date/Time   WBC 6.1 07/18/2019 1153   WBC 7.6 05/05/2013 1116   RBC 4.73 07/18/2019 1153   RBC 4.82 05/05/2013 1116   HGB 12.6 (L) 07/18/2019 1153   HCT 37.6 07/18/2019 1153   PLT 368 07/18/2019 1153   MCV 80 07/18/2019 1153   MCH 26.6 07/18/2019 1153   MCH 28.0 05/05/2013 1116   MCHC 33.5 07/18/2019 1153   MCHC 35.7 05/05/2013 1116   RDW 17.0 (H) 07/18/2019 1153   LYMPHSABS 2.0 07/18/2019 1153   MONOABS 0.7 05/05/2013 1116  EOSABS 0.4 07/18/2019 1153   BASOSABS 0.1 07/18/2019 1153    Lab Results  Component Value Date   HGBA1C >15.0 03/19/2024    Lab Results  Component Value Date   HGBA1C >15.0 03/19/2024   HGBA1C 6.9 10/24/2023   HGBA1C 8.7 (A) 07/24/2023        Assessment & Plan Type 2 diabetes mellitus with poor glycemic control and diabetic polyneuropathy A1c > 15%. Not taking Ozempic  for two months. Current blood sugar 190 mg/dL. Diabetic polyneuropathy present. - Increase Lantus  to 40 units nightly. - Start Ozempic  0.25 mg weekly on Wednesdays. - Review medications with Herlene in one week. - Adjust insulin  management after reviewing blood sugar log.  Chronic kidney disease stage 3 Hypertensive and diabetic nephropathy Previous abnormal kidney function tests. Last potassium level high. Emphasized diabetes control to prevent further kidney damage. - Check  kidney function tests, including potassium. - Follow-up with nephrology   Hypertension associated with stage IIIb chronic kidney disease secondary to type 2 diabetes mellitus Medication discrepancies noted. Currently taking amlodipine , missing carvedilol  and valsartan -hydrochlorothiazide . - Review all blood pressure medications at next visit. - Check pharmacy for carvedilol  and valsartan -hydrochlorothiazide  prescriptions. - I have verified with the pharmacy that he did pick up his carvedilol  and valsartan  hydrochlorothiazide  prescriptions for 90-day supply last week but I think he might have forgotten his medications at home -Continue amlodipine , carvedilol , valsartan /hydrochlorothiazide  - Advised to bring in all his medications to his next office visit   Hyperlipidemia associated with type 2 diabetes mellitus Controlled Will send of lipid panel Continue statin Low-cholesterol diet        Healthcare maintenance Need for flu vaccine-flu vaccine administered  Meds ordered this encounter  Medications   atorvastatin  (LIPITOR) 40 MG tablet    Sig: Take 1 tablet (40 mg total) by mouth daily.    Dispense:  90 tablet    Refill:  1   gabapentin  (NEURONTIN ) 300 MG capsule    Sig: Take 1 capsule (300 mg total) by mouth daily.    Dispense:  90 capsule    Refill:  1   insulin  glargine (LANTUS  SOLOSTAR) 100 UNIT/ML Solostar Pen    Sig: Inject 40 Units into the skin daily.    Dispense:  45 mL    Refill:  1   traMADol  (ULTRAM ) 50 MG tablet    Sig: Take 1 tablet (50 mg total) by mouth at bedtime as needed.    Dispense:  30 tablet    Refill:  1   Semaglutide ,0.25 or 0.5MG /DOS, (OZEMPIC , 0.25 OR 0.5 MG/DOSE,) 2 MG/3ML SOPN    Sig: Inject 0.25 mg into the skin once a week. For 4 weeks then increase to 0.5mg     Dispense:  3 mL    Refill:  0    He has been without Ozempic  for 2 months so new titration restarted. Discontinue 2mg  dose   Semaglutide ,0.25 or 0.5MG /DOS, (OZEMPIC , 0.25 OR 0.5  MG/DOSE,) 2 MG/3ML SOPN    Sig: Inject 0.5 mg into the skin once a week. For 4 weeks then increase to 1mg     Dispense:  3 mL    Refill:  0   Semaglutide , 1 MG/DOSE, 4 MG/3ML SOPN    Sig: Inject 1 mg as directed once a week.    Dispense:  3 mL    Refill:  3    Follow-up: Return in about 1 month (around 04/18/2024) for Diabetes follow-up with PCP.       Corrina Sabin, MD, FAAFP. [facility] [place], [place] [phone]   03/19/2024, 1:07 PM

## 2024-03-20 NOTE — Telephone Encounter (Signed)
 Spoke with patient . Patient only needs insulin  pen needles. Patient voided that he did not know was not taking glipizide  any more, because he has not had it filled in a while. Patent  voiced that he has enough Lantus  and Ozempic .

## 2024-03-24 ENCOUNTER — Ambulatory Visit: Admitting: Pharmacist

## 2024-03-25 ENCOUNTER — Other Ambulatory Visit (HOSPITAL_BASED_OUTPATIENT_CLINIC_OR_DEPARTMENT_OTHER): Admitting: Pharmacist

## 2024-03-25 DIAGNOSIS — Z794 Long term (current) use of insulin: Secondary | ICD-10-CM

## 2024-03-25 DIAGNOSIS — Z7985 Long-term (current) use of injectable non-insulin antidiabetic drugs: Secondary | ICD-10-CM

## 2024-03-25 DIAGNOSIS — E1142 Type 2 diabetes mellitus with diabetic polyneuropathy: Secondary | ICD-10-CM

## 2024-03-25 NOTE — Progress Notes (Signed)
 S:     No chief complaint on file.  73 y.o. male who presents for diabetes evaluation, education, and management. PMH is significant for T2DM, HTN, HLD, CKD Stage III.   Patient was referred by Primary Care Provider, Dr. Delbert, on 03/19/24. A1c was >15.5% due to being without medication after a 37-month stay in [place]. During that stay, he ran out of medication. Dr. Newlin had him restart Lantus  daily and Ozempic . Of note, we had to restart 0.25 mg Ozempic  weekly due to him being without for 3 months.   Today, patient is in good spirits. He is doing much better today with no complaints. Denies any NV, abdominal pain. No changes in vision. He notes today, specifically, resolution of polyuria since restarting medications. He correctly restarted Ozempic  at the 0.25 mg dose. Verbalizes his Lantus  dose as 40 units daily. Over the last 3 days, his sugars have ranged from 55 mg/dL fasting - 805 mg/dL post-prandial. He has experienced AM hypoglycemia with objective values of 55, 58, and 59 mg/dL the last 3 days.  Family/Social History:  Former smoker No alcohol use  Current diabetes medications include: Lantus  40 units daily, Ozempic  0.25 mg weekly (Weds)  Patient denies (nighttime urination). Polyuria has resolved since restarting medications last week.  Patient denies neuropathy (nerve pain). Patient denies visual changes. Patient reports self foot exams. Everyday.  Patient reported dietary habits: Eats 1-2 meals/day. Sometimes skips meals at work. Reports that he has minimized intake of sugar/carbohydrates. He has also cut back on salt.  Breakfast: sometimes  Lunch: toasted bread, green tea with honey  Dinner: brown rice, fufu, plantains, chicken, fish, spinach, peppers Snacks: does not snack much Drinks: only water  Patient-reported exercise habits: no formal exercise outside of work. Walking all day at work.   O:  BP Readings from Last 3 Encounters:  03/19/24 130/71  12/24/23 116/68   11/19/23 (!) 140/84   Lab Results  Component Value Date   HGBA1C >15.0 03/19/2024   There were no vitals filed for this visit.  Lipid Panel     Component Value Date/Time   CHOL 145 07/24/2023 1031   TRIG 79 07/24/2023 1031   HDL 48 07/24/2023 1031   CHOLHDL 3.0 11/16/2020 1042   CHOLHDL 2.8 02/29/2016 1018   VLDL 12 02/29/2016 1018   LDLCALC 82 07/24/2023 1031    Clinical Atherosclerotic Cardiovascular Disease (ASCVD): No  The 10-year ASCVD risk score (Arnett DK, et al., 2019) is: 36.3%   Values used to calculate the score:     Age: 80 years     Clincally relevant sex: Male     Is Non-Hispanic African American: Yes     Diabetic: Yes     Tobacco smoker: No     Systolic Blood Pressure: 130 mmHg     Is BP treated: Yes     HDL Cholesterol: 48 mg/dL     Total Cholesterol: 145 mg/dL    A/P: Diabetes longstanding and currently uncontrolled with last A1C of >15.5% in the setting of running out of medications. A1c goal < 7%. Patient is at high risk for hypoglycemia and endorses several readings in the 50s since resuming medications. He is taking both the Ozempic  and Lantus  as prescribed. To reduce risk of hypoglycemia, will continue Ozempic  and decrease insulin  today. Patient is able to verbalize appropriate hypoglycemia management plan. Medication adherence appears optimal.  -Continue Ozempic  0.25 mg once weekly for 3 more doses, then increase to 0.5 mg weekly thereafter.  -  Decrease Lantus  to 36 units daily -Patient educated on purpose, proper use, and potential adverse effects of Ozempic .  -Extensively discussed pathophysiology of diabetes, recommended lifestyle interventions, dietary effects on blood sugar control.  -Counseled on s/sx of and management of hypoglycemia.  -Next A1c anticipated 04/2024  Patient verbalized understanding of treatment plan.  Total time in counseling 10 minutes.    Follow-up:  PCP 04/03/24 Me: in October  Herlene Fleeta Morris, PharmD, [place],  CPP Clinical Pharmacist [facility] [phone]

## 2024-04-03 ENCOUNTER — Ambulatory Visit: Attending: Family Medicine | Admitting: Family Medicine

## 2024-04-03 ENCOUNTER — Encounter: Payer: Self-pay | Admitting: Family Medicine

## 2024-04-03 ENCOUNTER — Other Ambulatory Visit: Payer: Self-pay

## 2024-04-03 VITALS — BP 160/84 | HR 82 | Ht 64.0 in | Wt 142.0 lb

## 2024-04-03 DIAGNOSIS — N401 Enlarged prostate with lower urinary tract symptoms: Secondary | ICD-10-CM

## 2024-04-03 DIAGNOSIS — E1122 Type 2 diabetes mellitus with diabetic chronic kidney disease: Secondary | ICD-10-CM

## 2024-04-03 DIAGNOSIS — I129 Hypertensive chronic kidney disease with stage 1 through stage 4 chronic kidney disease, or unspecified chronic kidney disease: Secondary | ICD-10-CM

## 2024-04-03 DIAGNOSIS — Z794 Long term (current) use of insulin: Secondary | ICD-10-CM | POA: Diagnosis not present

## 2024-04-03 DIAGNOSIS — R350 Frequency of micturition: Secondary | ICD-10-CM | POA: Diagnosis not present

## 2024-04-03 DIAGNOSIS — E1142 Type 2 diabetes mellitus with diabetic polyneuropathy: Secondary | ICD-10-CM

## 2024-04-03 DIAGNOSIS — N1832 Chronic kidney disease, stage 3b: Secondary | ICD-10-CM

## 2024-04-03 LAB — GLUCOSE, POCT (MANUAL RESULT ENTRY): POC Glucose: 83 mg/dL (ref 70–99)

## 2024-04-03 MED ORDER — LANTUS SOLOSTAR 100 UNIT/ML ~~LOC~~ SOPN: 36.0000 [IU] | PEN_INJECTOR | Freq: Every day | SUBCUTANEOUS | Status: DC

## 2024-04-03 MED ORDER — TAMSULOSIN HCL 0.4 MG PO CAPS
0.4000 mg | ORAL_CAPSULE | Freq: Every day | ORAL | 1 refills | Status: DC
  Filled 2024-04-03: qty 90, 90d supply, fill #0

## 2024-04-03 NOTE — Progress Notes (Signed)
 Subjective:  Patient ID: Jerry Serrano, male    DOB: 29-Mar-1951  Age: 73 y.o. MRN: [id number]  CC: Diabetes     Discussed the use of AI scribe software for clinical note transcription with the patient, who gave verbal consent to proceed.  History of Present Illness Jerry Serrano is a 73 year old male with  with a history ofhypertension, hyperlipidemia, type 2 diabetes mellitus, stage III CKD  who presents for follow-up of blood sugar and blood pressure management.  Blood sugar levels have improved with home readings of 89 mg/dL in the morning and 71 to 89 mg/dL in the evenings. He is on 36 units of Lantus  at night (even though his med list reveals 40 units) and 0.25 mg of Ozempic  weekly, having started Ozempic  two weeks ago.  Denies presence of hypoglycemia.  Blood pressure remains elevated at home with readings of 150/98 mmHg and 145/92 mmHg. Current medications include amlodipine , carvedilol , and valsartan /hydrochlorothiazide . Previous visit blood pressure was 130/71 mmHg at his last office visit.  He experiences frequent urination, nocturia, and a sensation of incomplete bladder emptying. No constipation or other urinary issues.    Past Medical History:  Diagnosis Date   Allergy    Arthritis    Diabetes mellitus    Hyperlipidemia    Hypertension     Past Surgical History:  Procedure Laterality Date   HERNIA REPAIR      Family History  Problem Relation Age of Onset   Colon cancer Neg Hx    Esophageal cancer Neg Hx    Rectal cancer Neg Hx    Stomach cancer Neg Hx     Social History   Socioeconomic History   Marital status: Married    Spouse name: Not on file   Number of children: Not on file   Years of education: Not on file   Highest education level: Not on file  Occupational History   Not on file  Tobacco Use   Smoking status: Former    Types: Cigarettes   Smokeless tobacco: Never   Tobacco comments:    over 30 years ago  Vaping Use   Vaping  status: Never Used  Substance and Sexual Activity   Alcohol use: No   Drug use: No   Sexual activity: Not on file  Other Topics Concern   Not on file  Social History Narrative   Not on file   Social Drivers of Health   Financial Resource Strain: Low Risk  (01/24/2023)   Overall Financial Resource Strain (CARDIA)    Difficulty of Paying Living Expenses: Not hard at all  Food Insecurity: No Food Insecurity (01/24/2023)   Hunger Vital Sign    Worried About Running Out of Food in the Last Year: Never true    Ran Out of Food in the Last Year: Never true  Transportation Needs: No Transportation Needs (01/24/2023)   PRAPARE - Administrator, Civil Service (Medical): No    Lack of Transportation (Non-Medical): No  Physical Activity: Sufficiently Active (01/24/2023)   Exercise Vital Sign    Days of Exercise per Week: 5 days    Minutes of Exercise per Session: 30 min  Stress: No Stress Concern Present (01/24/2023)   [employer] of Occupational Health - Occupational Stress Questionnaire    Feeling of Stress : Not at all  Social Connections: Moderately Integrated (01/24/2023)   Social Connection and Isolation Panel    Frequency of Communication with Friends and Family: More  than three times a week    Frequency of Social Gatherings with Friends and Family: Three times a week    Attends Religious Services: 1 to 4 times per year    Active Member of Clubs or Organizations: Yes    Attends Banker Meetings: More than 4 times per year    Marital Status: Widowed    No Known Allergies  Outpatient Medications Prior to Visit  Medication Sig Dispense Refill   Accu-Chek Softclix Lancets lancets Use to check blood sugar 3 times daily. 100 each 6   amLODipine  (NORVASC ) 10 MG tablet Take 1 tablet (10 mg total) by mouth daily. 90 tablet 1   aspirin  EC 81 MG tablet Take 1 tablet (81 mg total) by mouth daily. 30 tablet 3   atorvastatin  (LIPITOR) 40 MG tablet Take 1 tablet (40  mg total) by mouth daily. 90 tablet 1   betamethasone  dipropionate 0.05 % cream APPLY EXTERNALLY TO THE AFFECTED AREA TWICE DAILY 30 g 0   Blood Glucose Monitoring Suppl (ACCU-CHEK GUIDE) w/Device KIT Use as directed four times daily. 1 kit 0   carvedilol  (COREG ) 6.25 MG tablet Take 1 tablet (6.25 mg total) by mouth 2 (two) times daily with a meal. 180 tablet 3   gabapentin  (NEURONTIN ) 300 MG capsule Take 1 capsule (300 mg total) by mouth daily. 90 capsule 1   glucose blood (ACCU-CHEK GUIDE TEST) test strip Use to check blood sugar 3 times daily. 100 each 6   olopatadine (PATANOL) 0.1 % ophthalmic solution Place 1 drop into both eyes 2 (two) times daily.     Semaglutide , 1 MG/DOSE, 4 MG/3ML SOPN Inject 1 mg as directed once a week. 3 mL 3   Semaglutide ,0.25 or 0.5MG /DOS, (OZEMPIC , 0.25 OR 0.5 MG/DOSE,) 2 MG/3ML SOPN Inject 0.25 mg into the skin once a week. For 4 weeks then increase to 0.5mg  3 mL 0   Semaglutide ,0.25 or 0.5MG /DOS, (OZEMPIC , 0.25 OR 0.5 MG/DOSE,) 2 MG/3ML SOPN Inject 0.5 mg into the skin once a week. For 4 weeks then increase to 1mg  3 mL 0   sildenafil  (VIAGRA ) 50 MG tablet Take 1 tablet (50 mg total) by mouth daily as needed for erectile dysfunction. 10 tablet 1   traMADol  (ULTRAM ) 50 MG tablet Take 1 tablet (50 mg total) by mouth at bedtime as needed. 30 tablet 1   valsartan -hydrochlorothiazide  (DIOVAN -HCT) 320-25 MG tablet Take 1 tablet by mouth daily. 90 tablet 1   insulin  glargine (LANTUS  SOLOSTAR) 100 UNIT/ML Solostar Pen Inject 40 Units into the skin daily. 45 mL 1   Facility-Administered Medications Prior to Visit  Medication Dose Route Frequency Provider Last Rate Last Admin   0.9 %  sodium chloride  infusion  500 mL Intravenous Continuous Danis, Victory CROME III, MD         ROS Review of Systems  Constitutional:  Negative for activity change and appetite change.  HENT:  Negative for sinus pressure and sore throat.   Respiratory:  Negative for chest tightness, shortness of  breath and wheezing.   Cardiovascular:  Negative for chest pain and palpitations.  Gastrointestinal:  Negative for abdominal distention, abdominal pain and constipation.  Genitourinary: Negative.   Musculoskeletal: Negative.   Psychiatric/Behavioral:  Negative for behavioral problems and dysphoric mood.     Objective:  BP (!) 160/84   Pulse 82   Ht 5' 4 (1.626 m)   Wt 142 lb (64.4 kg)   SpO2 98%   BMI 24.37 kg/m  04/03/2024   10:42 AM 04/03/2024   10:09 AM 03/19/2024    9:34 AM  BP/Weight  Systolic BP 160 161 130  Diastolic BP 84 83 71  Wt. (Lbs)  142 140.4  BMI  24.37 kg/m2 24.1 kg/m2      Physical Exam Constitutional:      Appearance: He is well-developed.  Cardiovascular:     Rate and Rhythm: Normal rate.     Heart sounds: Normal heart sounds. No murmur heard. Pulmonary:     Effort: Pulmonary effort is normal.     Breath sounds: Normal breath sounds. No wheezing or rales.  Chest:     Chest wall: No tenderness.  Abdominal:     General: Bowel sounds are normal. There is no distension.     Palpations: Abdomen is soft. There is no mass.     Tenderness: There is no abdominal tenderness.  Musculoskeletal:        General: Normal range of motion.     Right lower leg: No edema.     Left lower leg: No edema.  Neurological:     Mental Status: He is alert and oriented to person, place, and time.  Psychiatric:        Mood and Affect: Mood normal.        Latest Ref Rng & Units 07/24/2023   10:31 AM 03/05/2023   11:25 AM 09/26/2022   10:27 AM  CMP  Glucose 70 - 99 mg/dL 883  665  890   BUN 8 - 27 mg/dL 44  32  29   Creatinine 0.76 - 1.27 mg/dL 7.61  8.12  7.84   Sodium 134 - 144 mmol/L 136  134  139   Potassium 3.5 - 5.2 mmol/L 5.5  5.0  4.8   Chloride 96 - 106 mmol/L 100  99  104   CO2 20 - 29 mmol/L 20  21  17    Calcium  8.6 - 10.2 mg/dL 9.8  9.8  9.8   Total Protein 6.0 - 8.5 g/dL 7.3  7.3  7.3   Total Bilirubin 0.0 - 1.2 mg/dL 0.4  0.4  0.5   Alkaline  Phos 44 - 121 IU/L 100  198  100   AST 0 - 40 IU/L 24  23  23    ALT 0 - 44 IU/L 24  37  26     Lipid Panel     Component Value Date/Time   CHOL 145 07/24/2023 1031   TRIG 79 07/24/2023 1031   HDL 48 07/24/2023 1031   CHOLHDL 3.0 11/16/2020 1042   CHOLHDL 2.8 02/29/2016 1018   VLDL 12 02/29/2016 1018   LDLCALC 82 07/24/2023 1031    CBC    Component Value Date/Time   WBC 6.1 07/18/2019 1153   WBC 7.6 05/05/2013 1116   RBC 4.73 07/18/2019 1153   RBC 4.82 05/05/2013 1116   HGB 12.6 (L) 07/18/2019 1153   HCT 37.6 07/18/2019 1153   PLT 368 07/18/2019 1153   MCV 80 07/18/2019 1153   MCH 26.6 07/18/2019 1153   MCH 28.0 05/05/2013 1116   MCHC 33.5 07/18/2019 1153   MCHC 35.7 05/05/2013 1116   RDW 17.0 (H) 07/18/2019 1153   LYMPHSABS 2.0 07/18/2019 1153   MONOABS 0.7 05/05/2013 1116   EOSABS 0.4 07/18/2019 1153   BASOSABS 0.1 07/18/2019 1153    Lab Results  Component Value Date   HGBA1C >15.0 03/19/2024       Assessment & Plan Type 2 diabetes  mellitus with diabetic polyneuropathy Uncontrolled with A1c of greater than 15 from 03/2024 Blood glucose levels: readings 71-89 mg/dL indicating improvement - Next A1c anticipated in 06/2024 - Continue Lantus  36 units nightly until October 8, then reduce to 32 units with Ozempic  increase to 0.5 mg. - Continue Ozempic  0.25 mg weekly for two more weeks weeks, then increase to 0.5 mg weekly starting October 8. - Follow up on October 28 for diabetes management.  Hypertension associated with stage 3 chronic kidney disease Home blood pressure elevated, readings 150/98 mmHg and 145/92 mmHg. Current medications include amlodipine , carvedilol , valsartan -hydrochlorothiazide . Flomax  added, may lower blood pressure. - Order blood tests to assess current status. - Recheck blood pressure after sitting. - Monitor blood pressure closely, especially after starting Flomax . - Hold off on adding another antihypertensive medication.  Benign  prostatic hyperplasia with lower urinary tract symptoms Frequent urination, nocturia, incomplete bladder emptying suggest BPH. - Prescribe Flomax  to reduce urinary symptoms and shrink prostate. - He does have an elevated PSA frm last year for which I had referred him to urology      Meds ordered this encounter  Medications   tamsulosin  (FLOMAX ) 0.4 MG CAPS capsule    Sig: Take 1 capsule (0.4 mg total) by mouth daily.    Dispense:  90 capsule    Refill:  1   insulin  glargine (LANTUS  SOLOSTAR) 100 UNIT/ML Solostar Pen    Sig: Inject 36 Units into the skin daily.    Follow-up: Return for previously scheduled appointment.       Corrina Sabin, MD, FAAFP. [facility] [place], [place] [phone]   04/03/2024, 12:57 PM

## 2024-04-03 NOTE — Patient Instructions (Signed)
 On 04/16/24 start Ozempic  0.5 mg and reduce Lantus  to 32 units

## 2024-04-05 LAB — CMP14+EGFR
ALT: 41 IU/L (ref 0–44)
AST: 53 IU/L — ABNORMAL HIGH (ref 0–40)
Albumin: 4.8 g/dL (ref 3.8–4.8)
Alkaline Phosphatase: 111 IU/L (ref 47–123)
BUN/Creatinine Ratio: 12 (ref 10–24)
BUN: 20 mg/dL (ref 8–27)
Bilirubin Total: 0.6 mg/dL (ref 0.0–1.2)
CO2: 20 mmol/L (ref 20–29)
Calcium: 10.5 mg/dL — ABNORMAL HIGH (ref 8.6–10.2)
Chloride: 103 mmol/L (ref 96–106)
Creatinine, Ser: 1.63 mg/dL — ABNORMAL HIGH (ref 0.76–1.27)
Globulin, Total: 3 g/dL (ref 1.5–4.5)
Glucose: 82 mg/dL (ref 70–99)
Potassium: 4.8 mmol/L (ref 3.5–5.2)
Sodium: 140 mmol/L (ref 134–144)
Total Protein: 7.8 g/dL (ref 6.0–8.5)
eGFR: 44 mL/min/1.73 — ABNORMAL LOW (ref >59)

## 2024-04-05 LAB — MICROALBUMIN / CREATININE URINE RATIO
Creatinine, Urine: 62.9 mg/dL
Microalb/Creat Ratio: 67 mg/g{creat} — ABNORMAL HIGH (ref 0–29)
Microalbumin, Urine: 42.2 ug/mL

## 2024-04-05 LAB — LP+NON-HDL CHOLESTEROL
Cholesterol, Total: 159 mg/dL (ref 100–199)
HDL: 74 mg/dL (ref >39)
LDL Chol Calc (NIH): 73 mg/dL (ref 0–99)
Total Non-HDL-Chol (LDL+VLDL): 85 mg/dL (ref 0–129)
Triglycerides: 60 mg/dL (ref 0–149)
VLDL Cholesterol Cal: 12 mg/dL (ref 5–40)

## 2024-04-07 ENCOUNTER — Ambulatory Visit: Payer: Self-pay | Admitting: Family Medicine

## 2024-04-08 ENCOUNTER — Ambulatory Visit: Payer: Medicare Other

## 2024-04-08 NOTE — Telephone Encounter (Signed)
 Copied from CRM [id number]. Topic: Clinical - Lab/Test Results >> Apr 08, 2024  2:22 PM Fonda T wrote: Reason for CRM: Received call from patient, returning call to office to discuss lab results in detail.  Patient can be reached at [phone] to discuss further.

## 2024-04-09 DIAGNOSIS — N1832 Chronic kidney disease, stage 3b: Secondary | ICD-10-CM | POA: Diagnosis not present

## 2024-04-14 ENCOUNTER — Other Ambulatory Visit: Payer: Self-pay

## 2024-04-14 DIAGNOSIS — N1832 Chronic kidney disease, stage 3b: Secondary | ICD-10-CM | POA: Diagnosis not present

## 2024-04-14 DIAGNOSIS — E875 Hyperkalemia: Secondary | ICD-10-CM | POA: Diagnosis not present

## 2024-04-14 DIAGNOSIS — I129 Hypertensive chronic kidney disease with stage 1 through stage 4 chronic kidney disease, or unspecified chronic kidney disease: Secondary | ICD-10-CM | POA: Diagnosis not present

## 2024-04-14 DIAGNOSIS — E559 Vitamin D deficiency, unspecified: Secondary | ICD-10-CM | POA: Diagnosis not present

## 2024-04-14 DIAGNOSIS — E1122 Type 2 diabetes mellitus with diabetic chronic kidney disease: Secondary | ICD-10-CM | POA: Diagnosis not present

## 2024-04-14 MED ORDER — EMPAGLIFLOZIN 10 MG PO TABS
10.0000 mg | ORAL_TABLET | Freq: Every day | ORAL | 5 refills | Status: DC
  Filled 2024-04-14: qty 30, 30d supply, fill #0

## 2024-04-16 ENCOUNTER — Other Ambulatory Visit: Payer: Self-pay

## 2024-04-24 ENCOUNTER — Ambulatory Visit: Admitting: Family Medicine

## 2024-04-27 NOTE — Progress Notes (Unsigned)
 S:    No chief complaint on file.  73 y.o. male who presents for diabetes evaluation, education, and management. PMH is significant for T2DM, HTN, HLD, CKD Stage III.   Patient was referred by Primary Care Provider, Dr. Delbert, on 03/19/24. A1c was >15.5% due to being without medication after a 82-month stay in [place]. During that stay, he ran out of medication. Dr. Newlin had him restart Lantus  daily and Ozempic . Of note, we had to restart 0.25 mg Ozempic  weekly due to him being without for 3 months.   Patient last saw pharmacy on 03/25/24 via telephonic outreach. Patient reported having fasting hypoglycemic values in the 50s. Lantus  decreased to 36 unit and Ozempic  increased to 0.5 mg weekly.  Today, patient is in good spirits. He states that he is doing well. He has no major complaints today. Denies any side effects with Ozempic . Denies any hypoglycemic events or values. Patient noted that Farxiga is expensive for him. He paid $80 for a 30 day supply.  He checks his blood sugars every morning. Home BG readings are normally in the 100s and occasionally will see BG in the 80s. Today his BG was elevated at 144 mg/dL, but he reported eating strawberries today.   Patient checks BP at home as well. Recalled last BP ~135/66 mmHg with HR in 70s. He was not too confident on the actual numbers, but he thinks that the numbers are roughly around there.  Family/Social History:  Former smoker No alcohol use  Current diabetes medications include: Lantus  36 units daily, Ozempic  0.5 mg weekly (Weds), Jardiance 10 mg  Patient denies (nighttime urination). Polyuria has resolved since restarting medications last week.  Patient denies neuropathy (nerve pain). Patient denies visual changes. Patient reports self foot exams. Everyday.  Patient reported dietary habits: Eats 1-2 meals/day. Sometimes skips meals at work. Reports that he has minimized intake of sugar/carbohydrates. He has also cut back on salt.   Breakfast: sometimes  Lunch: toasted bread, green tea with honey  Dinner: brown rice, fufu, plantains, chicken, fish, spinach, peppers Snacks: does not snack much Drinks: only water  Patient-reported exercise habits: no formal exercise outside of work. Walking all day at work.   O:  BP Readings from Last 3 Encounters:  04/03/24 (!) 160/84  03/19/24 130/71  12/24/23 116/68   Lab Results  Component Value Date   HGBA1C >15.0 03/19/2024   There were no vitals filed for this visit.  Lipid Panel     Component Value Date/Time   CHOL 159 04/03/2024 1109   TRIG 60 04/03/2024 1109   HDL 74 04/03/2024 1109   CHOLHDL 3.0 11/16/2020 1042   CHOLHDL 2.8 02/29/2016 1018   VLDL 12 02/29/2016 1018   LDLCALC 73 04/03/2024 1109    Clinical Atherosclerotic Cardiovascular Disease (ASCVD): No  The 10-year ASCVD risk score (Arnett DK, et al., 2019) is: 45.3%   Values used to calculate the score:     Age: 47 years     Clincally relevant sex: Male     Is Non-Hispanic African American: Yes     Diabetic: Yes     Tobacco smoker: No     Systolic Blood Pressure: 160 mmHg     Is BP treated: Yes     HDL Cholesterol: 74 mg/dL     Total Cholesterol: 159 mg/dL    A/P: Diabetes longstanding and currently uncontrolled with last A1C of >15.5% in the setting of running out of medications. A1c goal < 7%. No  recent issues with hypoglycemia or GI issues. Although being unsymptomatic, patient is able to verbalize appropriate hypoglycemia management plan. Medication adherence appears optimal.  -Increase Ozempic  0.5 mg to 1 mg weekly. Patient was previously stable on 2 mg before leaving for [place]. Will continue to work tolerability back up to achieve A1c goal. Patient is currently on Novo Ozempic  PAP which will be discontinuing in 2026. Will have to complete Extra Help application with patient and consider other GLP alterantives if necessary. -Continue Lantus  to 36 units daily.  -Discontinue Jaridance 10 mg  and switch to Farxiga 10 mg. Patient noted that Bernadine was expensive. Will switch him over to [place] with patient assistance program. Extra Help application will qualify here as well. -Patient educated on purpose, proper use, and potential adverse effects of Ozempic .  -Extensively discussed pathophysiology of diabetes, recommended lifestyle interventions, dietary effects on blood sugar control.  -Counseled on s/sx of and management of hypoglycemia.  -Next A1c anticipated 06/2024.  Patient verbalized understanding of treatment plan.  Total time in counseling 20 minutes.    Follow-up:  PCP visit on 06/18/24 with Dr. Delbert Pharmacist: 06/02/24  Jenkins Graces, PharmD PGY1 Pharmacy Resident [phone]

## 2024-04-28 ENCOUNTER — Other Ambulatory Visit: Payer: Self-pay

## 2024-04-28 ENCOUNTER — Ambulatory Visit: Attending: Family Medicine | Admitting: Pharmacist

## 2024-04-28 ENCOUNTER — Encounter: Payer: Self-pay | Admitting: Pharmacist

## 2024-04-28 DIAGNOSIS — Z7985 Long-term (current) use of injectable non-insulin antidiabetic drugs: Secondary | ICD-10-CM

## 2024-04-28 DIAGNOSIS — E1142 Type 2 diabetes mellitus with diabetic polyneuropathy: Secondary | ICD-10-CM

## 2024-04-28 DIAGNOSIS — Z794 Long term (current) use of insulin: Secondary | ICD-10-CM | POA: Diagnosis not present

## 2024-04-28 DIAGNOSIS — Z7984 Long term (current) use of oral hypoglycemic drugs: Secondary | ICD-10-CM | POA: Diagnosis not present

## 2024-04-28 MED ORDER — DAPAGLIFLOZIN PROPANEDIOL 10 MG PO TABS
10.0000 mg | ORAL_TABLET | Freq: Every day | ORAL | 1 refills | Status: DC
  Filled 2024-04-28: qty 90, 90d supply, fill #0

## 2024-04-28 MED ORDER — ACCU-CHEK GUIDE W/DEVICE KIT
PACK | 0 refills | Status: AC
  Filled 2024-04-28: qty 1, 365d supply, fill #0

## 2024-04-28 MED ORDER — ACCU-CHEK SOFTCLIX LANCETS MISC
6 refills | Status: AC
  Filled 2024-04-28: qty 100, 33d supply, fill #0
  Filled 2024-07-07: qty 100, 33d supply, fill #1

## 2024-04-29 ENCOUNTER — Other Ambulatory Visit: Payer: Self-pay

## 2024-05-08 ENCOUNTER — Other Ambulatory Visit: Payer: Self-pay

## 2024-05-15 ENCOUNTER — Other Ambulatory Visit: Payer: Self-pay | Admitting: Pharmacist

## 2024-05-15 ENCOUNTER — Other Ambulatory Visit: Payer: Self-pay

## 2024-05-15 NOTE — Progress Notes (Signed)
 Pharmacy Quality Measure Review  This patient is appearing on a report for being at risk of failing the adherence measure for hypertension (ACEi/ARB) medications this calendar year.   Medication: valsartan -hydrochlorothiazide  Last fill date: 03/12/2024 for 90 day supply  Insurance report was not up to date. No action needed at this time. Reminder set for next fill.   Jerry Serrano, PharmD, JAQUELINE, CPP Clinical Pharmacist [facility] [phone]

## 2024-05-21 NOTE — Progress Notes (Signed)
 Jerry Serrano                                          MRN: [id number]   05/21/2024   The VBCI Quality Team Specialist reviewed this patient medical record for the purposes of chart review for care gap closure. The following were reviewed: chart review for care gap closure-glycemic status assessment.    VBCI Quality Team

## 2024-05-26 ENCOUNTER — Other Ambulatory Visit: Payer: Self-pay

## 2024-05-26 ENCOUNTER — Other Ambulatory Visit (HOSPITAL_BASED_OUTPATIENT_CLINIC_OR_DEPARTMENT_OTHER): Payer: Self-pay

## 2024-05-29 ENCOUNTER — Other Ambulatory Visit: Payer: Self-pay

## 2024-05-29 MED ORDER — EMPAGLIFLOZIN 10 MG PO TABS
10.0000 mg | ORAL_TABLET | Freq: Every day | ORAL | 5 refills | Status: AC
Start: 2024-05-29 — End: ?
  Filled 2024-05-29: qty 30, 30d supply, fill #0

## 2024-05-30 ENCOUNTER — Telehealth: Payer: Self-pay | Admitting: Family Medicine

## 2024-05-30 NOTE — Telephone Encounter (Signed)
 Confirmed appt for 11/24

## 2024-06-01 NOTE — Progress Notes (Unsigned)
 S:    No chief complaint on file.  73 y.o. male who presents for diabetes evaluation, education, and management. PMH is significant for T2DM, HTN, HLD, CKD Stage III.   Patient was referred by Primary Care Provider, Dr. Delbert, on 03/19/24. A1c was >15.5% due to being without medication after a 29-month stay in [place]. During that stay, he ran out of medication. Dr. Newlin had him restart Lantus  daily and Ozempic . Of note, we had to restart 0.25 mg Ozempic  weekly due to him being without for 3 months.   Patient last saw pharmacy on 04/28/24. Patient denied having hypoglycemia. Ozempic  increased to 1 mg weekly. Patient reported difficulty affording Jardiance . Farxiga  was initiated for patient assistance. Of note, patient is on Ozempic  MAP but this is ending in 2026. It was recommended to bring in documentation for Medicare Extra Help and we would assist him in applying today.  Today, patient is in good spirits. He states that he is doing well. He has no major complaints today. Denies any side effects with Ozempic . Denies any hypoglycemic events or values. FBG reading of 99 mg/dL this morning. Of note, he checks his BP in the morning along with BG. Reported home readings of 130s/80s mmHg.  Family/Social History:  Former smoker No alcohol use  Current diabetes medications include: Lantus  36 units daily (32 units), Ozempic  1 mg [place] weekly (Weds), Jardiance  10 mg   Patient denies (nighttime urination). Polyuria has resolved since restarting medications last week.  Patient denies neuropathy (nerve pain). Patient denies visual changes. Patient reports self foot exams. Everyday.  Patient reported dietary habits: Eats 1-2 meals/day. Sometimes skips meals at work. Reports that he has minimized intake of sugar/carbohydrates. He has also cut back on salt.  Breakfast: sometimes  Lunch: toasted bread, green tea with honey  Dinner: brown rice, tutu, plantains, chicken, fish, spinach, peppers Snacks:  does not snack much Drinks: only water (a lot)  Patient-reported exercise habits: no formal exercise outside of work. Walking all day at work.   O:  BP Readings from Last 3 Encounters:  04/03/24 (!) 160/84  03/19/24 130/71  12/24/23 116/68   Lab Results  Component Value Date   HGBA1C >15.0 03/19/2024   There were no vitals filed for this visit.  Lipid Panel     Component Value Date/Time   CHOL 159 04/03/2024 1109   TRIG 60 04/03/2024 1109   HDL 74 04/03/2024 1109   CHOLHDL 3.0 11/16/2020 1042   CHOLHDL 2.8 02/29/2016 1018   VLDL 12 02/29/2016 1018   LDLCALC 73 04/03/2024 1109    Clinical Atherosclerotic Cardiovascular Disease (ASCVD): No  The 10-year ASCVD risk score (Arnett DK, et al., 2019) is: 45.3%   Values used to calculate the score:     Age: 31 years     Clincally relevant sex: Male     Is Non-Hispanic African American: Yes     Diabetic: Yes     Tobacco smoker: No     Systolic Blood Pressure: 160 mmHg     Is BP treated: Yes     HDL Cholesterol: 74 mg/dL     Total Cholesterol: 159 mg/dL    A/P: Diabetes longstanding and currently uncontrolled with last A1C of >15.5% in the setting of running out of medications. A1c goal < 7%. No recent issues with hypoglycemia or GI issues. Although being unsymptomatic, patient is able to verbalize appropriate hypoglycemia management plan. Medication adherence appears optimal.  -Increase Ozempic  1 mg to 2 mg  weekly. Patient was previously stable on 2 mg before leaving for [place]. Will continue to work tolerability back up to achieve A1c goal. INSTRUCTED PATIENT TO BRING IN INCOME PAPERWORK AT PCP APPOINTMENT. Patient will need to fill out LIS/Extra Help application. With 2026 coming up, patient could lose coverage of Ozempic . -DECREASE Lantus  to 28 units daily.  -Discontinue Jaridance 10 mg and switch to Farxiga  10 mg. Patient noted that Jardiance  was expensive. HIGHLY RECOMMENDED patient see [place] after appointment to sign up for  Farxiga  [place] application. -Patient educated on purpose, proper use, and potential adverse effects of Ozempic .  -Extensively discussed pathophysiology of diabetes, recommended lifestyle interventions, dietary effects on blood sugar control.  -Counseled on s/sx of and management of hypoglycemia.  -Next A1c anticipated 06/2024.  Hypertension is longstanding and uncontrolled with goal BP of < 130/80 mmHg. Last clinic BP 160/84 mmHg back in September 2025. Patient reports taking medications every day, but typically takes his medications after his appointments. RECOMMENDED taking medications in the morning everyday even on days with dr appointments. -Continue BP medication regimen. -Recommended checking BP twice a day (once in the morning and once in the evening).  Patient verbalized understanding of treatment plan.  Total time in counseling 30 minutes.    Follow-up:  PCP visit on 06/18/24 with Dr. Delbert Pharmacist: 07/07/24  Jenkins Graces, PharmD PGY1 Pharmacy Resident [phone]

## 2024-06-02 ENCOUNTER — Other Ambulatory Visit (HOSPITAL_COMMUNITY): Payer: Self-pay

## 2024-06-02 ENCOUNTER — Other Ambulatory Visit: Payer: Self-pay

## 2024-06-02 ENCOUNTER — Encounter: Payer: Self-pay | Admitting: Pharmacist

## 2024-06-02 ENCOUNTER — Ambulatory Visit: Attending: Family Medicine | Admitting: Pharmacist

## 2024-06-02 DIAGNOSIS — Z794 Long term (current) use of insulin: Secondary | ICD-10-CM | POA: Diagnosis not present

## 2024-06-02 DIAGNOSIS — E1142 Type 2 diabetes mellitus with diabetic polyneuropathy: Secondary | ICD-10-CM

## 2024-06-02 MED ORDER — LANTUS SOLOSTAR 100 UNIT/ML ~~LOC~~ SOPN
28.0000 [IU] | PEN_INJECTOR | Freq: Every day | SUBCUTANEOUS | 2 refills | Status: AC
  Filled 2024-06-02: qty 15, 53d supply, fill #0
  Filled 2024-08-01: qty 30, 107d supply, fill #0

## 2024-06-02 MED ORDER — SEMAGLUTIDE (2 MG/DOSE) 8 MG/3ML ~~LOC~~ SOPN
2.0000 mg | PEN_INJECTOR | SUBCUTANEOUS | 0 refills | Status: DC
  Filled 2024-06-02: qty 9, fill #0
  Filled 2024-06-02: qty 9, 84d supply, fill #0
  Filled ????-??-??: fill #0

## 2024-06-02 NOTE — Patient Instructions (Addendum)
 PLEASE BRING TAX FORM, PAY STUB, OR SOCIAL SECURITY BENEFIT FOR EXTRA HELP APPLICATION

## 2024-06-11 ENCOUNTER — Encounter: Payer: Self-pay | Admitting: Pharmacist

## 2024-06-11 ENCOUNTER — Other Ambulatory Visit: Payer: Self-pay

## 2024-06-11 ENCOUNTER — Other Ambulatory Visit: Payer: Self-pay | Admitting: Pharmacist

## 2024-06-11 NOTE — Progress Notes (Signed)
 Pharmacy Quality Measure Review  This patient is appearing on a report for being at risk of failing the adherence measure for hypertension (ACEi/ARB) medications this calendar year.   Medication: valsartan -hydrochlorothiazide  Last fill date: 03/12/2024 for 90 day supply  Insurance report was not up to date. No action needed at this time. Reminder set for next fill.   Herlene Fleeta Morris, PharmD, JAQUELINE, CPP Clinical Pharmacist [facility] [phone]

## 2024-06-13 ENCOUNTER — Other Ambulatory Visit: Payer: Self-pay

## 2024-06-13 ENCOUNTER — Other Ambulatory Visit: Payer: Self-pay | Admitting: Family Medicine

## 2024-06-13 DIAGNOSIS — M19249 Secondary osteoarthritis, unspecified hand: Secondary | ICD-10-CM

## 2024-06-13 MED ORDER — TRAMADOL HCL 50 MG PO TABS
50.0000 mg | ORAL_TABLET | Freq: Every evening | ORAL | 1 refills | Status: AC | PRN
  Filled 2024-06-13: qty 30, 30d supply, fill #0
  Filled 2024-07-29: qty 30, 30d supply, fill #1

## 2024-06-17 ENCOUNTER — Other Ambulatory Visit: Payer: Self-pay

## 2024-06-17 ENCOUNTER — Other Ambulatory Visit: Payer: Self-pay | Admitting: Family Medicine

## 2024-06-17 DIAGNOSIS — I129 Hypertensive chronic kidney disease with stage 1 through stage 4 chronic kidney disease, or unspecified chronic kidney disease: Secondary | ICD-10-CM

## 2024-06-18 ENCOUNTER — Ambulatory Visit: Payer: Self-pay | Attending: Family Medicine | Admitting: Family Medicine

## 2024-06-18 ENCOUNTER — Encounter: Payer: Self-pay | Admitting: Family Medicine

## 2024-06-18 ENCOUNTER — Other Ambulatory Visit: Payer: Self-pay

## 2024-06-18 VITALS — BP 119/68 | HR 77 | Temp 98.2°F | Ht 64.0 in | Wt 143.0 lb

## 2024-06-18 DIAGNOSIS — I129 Hypertensive chronic kidney disease with stage 1 through stage 4 chronic kidney disease, or unspecified chronic kidney disease: Secondary | ICD-10-CM | POA: Diagnosis not present

## 2024-06-18 DIAGNOSIS — E1122 Type 2 diabetes mellitus with diabetic chronic kidney disease: Secondary | ICD-10-CM

## 2024-06-18 DIAGNOSIS — Z794 Long term (current) use of insulin: Secondary | ICD-10-CM

## 2024-06-18 DIAGNOSIS — N1832 Chronic kidney disease, stage 3b: Secondary | ICD-10-CM

## 2024-06-18 DIAGNOSIS — R350 Frequency of micturition: Secondary | ICD-10-CM

## 2024-06-18 DIAGNOSIS — E1142 Type 2 diabetes mellitus with diabetic polyneuropathy: Secondary | ICD-10-CM | POA: Diagnosis not present

## 2024-06-18 DIAGNOSIS — R972 Elevated prostate specific antigen [PSA]: Secondary | ICD-10-CM | POA: Diagnosis not present

## 2024-06-18 DIAGNOSIS — N401 Enlarged prostate with lower urinary tract symptoms: Secondary | ICD-10-CM

## 2024-06-18 LAB — POCT GLYCOSYLATED HEMOGLOBIN (HGB A1C): HbA1c, POC (controlled diabetic range): 7.7 % — AB (ref 0.0–7.0)

## 2024-06-18 MED ORDER — HYDROCORTISONE 1 % EX OINT
1.0000 | TOPICAL_OINTMENT | Freq: Two times a day (BID) | CUTANEOUS | 0 refills | Status: AC
  Filled 2024-06-18: qty 28.35, 30d supply, fill #0

## 2024-06-18 MED ORDER — SEMAGLUTIDE (2 MG/DOSE) 8 MG/3ML ~~LOC~~ SOPN
2.0000 mg | PEN_INJECTOR | SUBCUTANEOUS | 1 refills | Status: AC
  Filled 2024-06-18: qty 9, 84d supply, fill #0

## 2024-06-18 MED ORDER — VALSARTAN-HYDROCHLOROTHIAZIDE 320-25 MG PO TABS
1.0000 | ORAL_TABLET | Freq: Every day | ORAL | 1 refills | Status: AC
  Filled 2024-06-18 – 2024-06-21 (×2): qty 90, 90d supply, fill #0

## 2024-06-18 MED ORDER — GABAPENTIN 300 MG PO CAPS
300.0000 mg | ORAL_CAPSULE | Freq: Every day | ORAL | 1 refills | Status: AC
  Filled 2024-07-07: qty 90, 90d supply, fill #0

## 2024-06-18 MED ORDER — TAMSULOSIN HCL 0.4 MG PO CAPS
0.8000 mg | ORAL_CAPSULE | Freq: Every day | ORAL | 1 refills | Status: AC
  Filled 2024-06-18: qty 180, 90d supply, fill #0
  Filled 2024-06-26: qty 180, 90d supply, fill #1
  Filled 2024-07-07: qty 180, 90d supply, fill #0

## 2024-06-18 NOTE — Progress Notes (Signed)
 Subjective:  Patient ID: Jerry Serrano, male    DOB: 15-Aug-1950  Age: 73 y.o. MRN: [id number]  CC: Medical Management of Chronic Issues (Frequent urination x5 months )     Discussed the use of AI scribe software for clinical note transcription with the patient, who gave verbal consent to proceed.  History of Present Illness Jerry Serrano is a 73 year old male with a history of hypertension, hyperlipidemia, type 2 diabetes mellitus, stage III CKD  who presents for follow-up and medication refills.  He has had no hypoglycemic episodes and takes his diabetes medications as prescribed. He requests refills today.  He reports daytime and nighttime urinary frequency without dysuria, straining, or other urinary complications. He takes tamsulosin  0.4 mg for presumed enlarged prostate and continues Jardiance . Seen by [facility], Dr. Patrcia in 06/2023 and per notes his prostate is large but considered normal anatomic variant.  Consider finasteride or if bothersome simple prostatectomy if retention recurs but per notes decision was made to hold off.  He takes his blood pressure medication as prescribed. He uses hydrocortisone  1% ointment for intermittent rashes in his hands and is requesting a refill.  He uses tramadol  for back pain as needed and sometimes also takes it when  he has no back pain.  Pain typically occurs in his lower back at the end of the workday.  He works at the [employer].    Past Medical History:  Diagnosis Date   Allergy    Arthritis    Diabetes mellitus    Hyperlipidemia    Hypertension     Past Surgical History:  Procedure Laterality Date   HERNIA REPAIR      Family History  Problem Relation Age of Onset   Colon cancer Neg Hx    Esophageal cancer Neg Hx    Rectal cancer Neg Hx    Stomach cancer Neg Hx     Social History   Socioeconomic History   Marital status: Married    Spouse name: Not on file   Number of children: Not on file   Years  of education: Not on file   Highest education level: Not on file  Occupational History   Not on file  Tobacco Use   Smoking status: Former    Types: Cigarettes   Smokeless tobacco: Never   Tobacco comments:    over 30 years ago  Vaping Use   Vaping status: Never Used  Substance and Sexual Activity   Alcohol use: No   Drug use: No   Sexual activity: Not on file  Other Topics Concern   Not on file  Social History Narrative   Not on file   Social Drivers of Health   Financial Resource Strain: Low Risk  (01/24/2023)   Overall Financial Resource Strain (CARDIA)    Difficulty of Paying Living Expenses: Not hard at all  Food Insecurity: No Food Insecurity (01/24/2023)   Hunger Vital Sign    Worried About Running Out of Food in the Last Year: Never true    Ran Out of Food in the Last Year: Never true  Transportation Needs: No Transportation Needs (01/24/2023)   PRAPARE - Administrator, Civil Service (Medical): No    Lack of Transportation (Non-Medical): No  Physical Activity: Sufficiently Active (01/24/2023)   Exercise Vital Sign    Days of Exercise per Week: 5 days    Minutes of Exercise per Session: 30 min  Stress: No Stress Concern Present (  01/24/2023)   [employer] - Occupational Stress Questionnaire    Feeling of Stress : Not at all  Social Connections: Moderately Integrated (01/24/2023)   Social Connection and Isolation Panel    Frequency of Communication with Friends and Family: More than three times a week    Frequency of Social Gatherings with Friends and Family: Three times a week    Attends Religious Services: 1 to 4 times per year    Active Member of Clubs or Organizations: Yes    Attends Banker Meetings: More than 4 times per year    Marital Status: Widowed    No Known Allergies  Outpatient Medications Prior to Visit  Medication Sig Dispense Refill   amLODipine  (NORVASC ) 10 MG tablet Take 1 tablet (10 mg  total) by mouth daily. 90 tablet 1   aspirin  EC 81 MG tablet Take 1 tablet (81 mg total) by mouth daily. 30 tablet 3   atorvastatin  (LIPITOR) 40 MG tablet Take 1 tablet (40 mg total) by mouth daily. 90 tablet 1   Blood Glucose Monitoring Suppl (ACCU-CHEK GUIDE) w/Device KIT Use as directed four times daily. 1 kit 0   carvedilol  (COREG ) 6.25 MG tablet Take 1 tablet (6.25 mg total) by mouth 2 (two) times daily with a meal. 180 tablet 3   dapagliflozin  propanediol (FARXIGA ) 10 MG TABS tablet Take 1 tablet (10 mg total) by mouth daily. 90 tablet 1   glucose blood (ACCU-CHEK GUIDE TEST) test strip Use to check blood sugar 3 times daily. 100 each 6   insulin  glargine (LANTUS  SOLOSTAR) 100 UNIT/ML Solostar Pen Inject 28 Units into the skin daily. 15 mL 2   olopatadine (PATANOL) 0.1 % ophthalmic solution Place 1 drop into both eyes 2 (two) times daily.     traMADol  (ULTRAM ) 50 MG tablet Take 1 tablet (50 mg total) by mouth at bedtime as needed. 30 tablet 1   tamsulosin  (FLOMAX ) 0.4 MG CAPS capsule Take 1 capsule (0.4 mg total) by mouth daily. 90 capsule 1   valsartan -hydrochlorothiazide  (DIOVAN -HCT) 320-25 MG tablet Take 1 tablet by mouth daily. 90 tablet 1   Accu-Chek Softclix Lancets lancets Use to check blood sugar 3 times daily. 100 each 6   empagliflozin  (JARDIANCE ) 10 MG TABS tablet Take 1 tablet (10 mg total) by mouth daily. (Patient not taking: Reported on 06/18/2024) 30 tablet 5   sildenafil  (VIAGRA ) 50 MG tablet Take 1 tablet (50 mg total) by mouth daily as needed for erectile dysfunction. (Patient not taking: Reported on 06/18/2024) 10 tablet 1   gabapentin  (NEURONTIN ) 300 MG capsule Take 1 capsule (300 mg total) by mouth daily. 90 capsule 1   Semaglutide , 2 MG/DOSE, 8 MG/3ML SOPN Inject 2 mg as directed once a week. 9 mL 0   Facility-Administered Medications Prior to Visit  Medication Dose Route Frequency Provider Last Rate Last Admin   0.9 %  sodium chloride  infusion  500 mL Intravenous  Continuous Danis, Victory CROME III, MD         ROS Review of Systems  Constitutional:  Negative for activity change and appetite change.  HENT:  Negative for sinus pressure and sore throat.   Respiratory:  Negative for chest tightness, shortness of breath and wheezing.   Cardiovascular:  Negative for chest pain and palpitations.  Gastrointestinal:  Negative for abdominal distention, abdominal pain and constipation.  Genitourinary:  Positive for frequency.  Musculoskeletal: Negative.   Psychiatric/Behavioral:  Negative for behavioral problems and dysphoric mood.  Objective:  BP 119/68   Pulse 77   Temp 98.2 F (36.8 C) (Oral)   Ht 5' 4 (1.626 m)   Wt 143 lb (64.9 kg)   SpO2 98%   BMI 24.55 kg/m      06/18/2024   10:22 AM 04/03/2024   10:42 AM 04/03/2024   10:09 AM  BP/Weight  Systolic BP 119 160 161  Diastolic BP 68 84 83  Wt. (Lbs) 143  142  BMI 24.55 kg/m2  24.37 kg/m2      Physical Exam Constitutional:      Appearance: He is well-developed.  Cardiovascular:     Rate and Rhythm: Normal rate.     Heart sounds: Normal heart sounds. No murmur heard. Pulmonary:     Effort: Pulmonary effort is normal.     Breath sounds: Normal breath sounds. No wheezing or rales.  Chest:     Chest wall: No tenderness.  Abdominal:     General: Bowel sounds are normal. There is no distension.     Palpations: Abdomen is soft. There is no mass.     Tenderness: There is no abdominal tenderness.  Musculoskeletal:        General: Normal range of motion.     Right lower leg: No edema.     Left lower leg: No edema.  Neurological:     Mental Status: He is alert and oriented to person, place, and time.  Psychiatric:        Mood and Affect: Mood normal.        Latest Ref Rng & Units 04/03/2024   11:09 AM 07/24/2023   10:31 AM 03/05/2023   11:25 AM  CMP  Glucose 70 - 99 mg/dL 82  883  665   BUN 8 - 27 mg/dL 20  44  32   Creatinine 0.76 - 1.27 mg/dL 8.36  7.61  8.12   Sodium 134 -  144 mmol/L 140  136  134   Potassium 3.5 - 5.2 mmol/L 4.8  5.5  5.0   Chloride 96 - 106 mmol/L 103  100  99   CO2 20 - 29 mmol/L 20  20  21    Calcium  8.6 - 10.2 mg/dL 89.4  9.8  9.8   Total Protein 6.0 - 8.5 g/dL 7.8  7.3  7.3   Total Bilirubin 0.0 - 1.2 mg/dL 0.6  0.4  0.4   Alkaline Phos 47 - 123 IU/L 111  100  198   AST 0 - 40 IU/L 53  24  23   ALT 0 - 44 IU/L 41  24  37     Lipid Panel     Component Value Date/Time   CHOL 159 04/03/2024 1109   TRIG 60 04/03/2024 1109   HDL 74 04/03/2024 1109   CHOLHDL 3.0 11/16/2020 1042   CHOLHDL 2.8 02/29/2016 1018   VLDL 12 02/29/2016 1018   LDLCALC 73 04/03/2024 1109    CBC    Component Value Date/Time   WBC 6.1 07/18/2019 1153   WBC 7.6 05/05/2013 1116   RBC 4.73 07/18/2019 1153   RBC 4.82 05/05/2013 1116   HGB 12.6 (L) 07/18/2019 1153   HCT 37.6 07/18/2019 1153   PLT 368 07/18/2019 1153   MCV 80 07/18/2019 1153   MCH 26.6 07/18/2019 1153   MCH 28.0 05/05/2013 1116   MCHC 33.5 07/18/2019 1153   MCHC 35.7 05/05/2013 1116   RDW 17.0 (H) 07/18/2019 1153   LYMPHSABS 2.0 07/18/2019 1153  MONOABS 0.7 05/05/2013 1116   EOSABS 0.4 07/18/2019 1153   BASOSABS 0.1 07/18/2019 1153    Lab Results  Component Value Date   HGBA1C 7.7 (A) 06/18/2024    Lab Results  Component Value Date   HGBA1C 7.7 (A) 06/18/2024   HGBA1C >15.0 03/19/2024   HGBA1C 6.9 10/24/2023       Assessment & Plan 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 No hypoglycemia reported. - Continue current diabetes medications. - Refilled diabetes medications. -Counseled on Diabetic diet, the healthy plate, 849 minutes of moderate intensity exercise/week Blood sugar logs with fasting goals of 80-120 mg/dl, random of less than 819 and in the event of sugars less than 60 mg/dl or greater than 599 mg/dl encouraged to notify the clinic. Advised on the need for annual  eye exams, annual foot exams, Pneumonia vaccine.   Hypertension associated with stage 3 chronic kidney disease Blood pressure well controlled with current regimen. -Combination of hypertensive and diabetic nephropathy - Continue current blood pressure medications. - Continue to follow-up with nephrology -Avoid nephrotoxins  Benign prostatic hyperplasia with lower urinary tract symptoms Experiencing excessive urination. On tamsulosin  0.4 mg. No straining or complications. - Increased tamsulosin  dose to 0.8 mg. - Ordered PSA level test.  Elevated prostate specific antigen (PSA) Previous PSA elevated at 7.4 and urologist referral made. -He was seen by urology and patient states he was informed everything was good. - Ordered PSA level test especially in the setting of ongoing urinary symptoms -Consider adding finasteride at next visit if symptoms persist.     Meds ordered this encounter  Medications   gabapentin  (NEURONTIN ) 300 MG capsule    Sig: Take 1 capsule (300 mg total) by mouth daily.    Dispense:  90 capsule    Refill:  1   Semaglutide , 2 MG/DOSE, 8 MG/3ML SOPN    Sig: Inject 2 mg as directed once a week.    Dispense:  9 mL    Refill:  1   valsartan -hydrochlorothiazide  (DIOVAN -HCT) 320-25 MG tablet    Sig: Take 1 tablet by mouth daily.    Dispense:  90 tablet    Refill:  1   hydrocortisone  1 % ointment    Sig: Apply 1 Application topically 2 (two) times daily.    Dispense:  30 g    Refill:  0   tamsulosin  (FLOMAX ) 0.4 MG CAPS capsule    Sig: Take 2 capsules (0.8 mg total) by mouth daily.    Dispense:  180 capsule    Refill:  1    Dose increase    Follow-up: Return in about 3 months (around 09/16/2024) for Chronic medical conditions.       Corrina Sabin, MD, FAAFP. [facility] [place], [place] [phone]   06/18/2024, 1:24 PM

## 2024-06-18 NOTE — Patient Instructions (Signed)
 VISIT SUMMARY:  You came in today for a follow-up visit and medication refills. We discussed your diabetes, hypertension, urinary frequency, and back pain. Your diabetes and blood pressure are well controlled with your current medications. We also talked about your urinary symptoms and made some adjustments to your treatment plan.  YOUR PLAN:  -TYPE 2 DIABETES MELLITUS WITH DIABETIC POLYNEUROPATHY AND CHRONIC KIDNEY DISEASE: Your diabetes is well controlled, and your A1c has improved to 7.7. This means your blood sugar levels are in a better range. Continue taking your current diabetes medications as prescribed. We have refilled your diabetes medications today.  -HYPERTENSION ASSOCIATED WITH STAGE 3 CHRONIC KIDNEY DISEASE: Your blood pressure is well controlled with your current medications. Hypertension means high blood pressure, which can affect your kidneys. Continue taking your current blood pressure medications as prescribed.  -BENIGN PROSTATIC HYPERPLASIA WITH LOWER URINARY TRACT SYMPTOMS: You are experiencing frequent urination, which is likely due to an enlarged prostate. We have increased your tamsulosin  dose to 0.8 mg to help with these symptoms. Benign prostatic hyperplasia is a non-cancerous enlargement of the prostate gland. We have also ordered a PSA level test to check your prostate health.  -ELEVATED PROSTATE SPECIFIC ANTIGEN (PSA): Your previous PSA levels were elevated, which can be a sign of prostate issues. We have ordered another PSA level test and referred you to a urologist for further evaluation. PSA is a protein produced by the prostate gland, and elevated levels can indicate prostate problems.  INSTRUCTIONS:  Please continue taking all your medications as prescribed. We have increased your tamsulosin  dose to 0.8 mg, so please take the new dose as directed. We have ordered a PSA level test, so please complete this test as soon as possible. Follow up with the urologist as  recommended. If you have any new symptoms or concerns, please contact our office.

## 2024-06-19 ENCOUNTER — Other Ambulatory Visit: Payer: Self-pay

## 2024-06-19 ENCOUNTER — Ambulatory Visit: Payer: Self-pay | Admitting: Family Medicine

## 2024-06-19 DIAGNOSIS — R972 Elevated prostate specific antigen [PSA]: Secondary | ICD-10-CM

## 2024-06-19 LAB — PSA, TOTAL AND FREE
PSA, Free Pct: 39.2 %
PSA, Free: 3.29 ng/mL
Prostate Specific Ag, Serum: 8.4 ng/mL — ABNORMAL HIGH (ref 0.0–4.0)

## 2024-06-20 ENCOUNTER — Other Ambulatory Visit: Payer: Self-pay

## 2024-06-23 ENCOUNTER — Other Ambulatory Visit: Payer: Self-pay

## 2024-06-24 ENCOUNTER — Other Ambulatory Visit: Payer: Self-pay

## 2024-06-27 ENCOUNTER — Other Ambulatory Visit: Payer: Self-pay

## 2024-07-07 ENCOUNTER — Other Ambulatory Visit: Payer: Self-pay

## 2024-07-07 ENCOUNTER — Ambulatory Visit: Attending: Family Medicine | Admitting: Pharmacist

## 2024-07-07 ENCOUNTER — Encounter: Payer: Self-pay | Admitting: Pharmacist

## 2024-07-07 ENCOUNTER — Telehealth: Payer: Self-pay | Admitting: Pharmacist

## 2024-07-07 DIAGNOSIS — Z794 Long term (current) use of insulin: Secondary | ICD-10-CM

## 2024-07-07 DIAGNOSIS — Z7984 Long term (current) use of oral hypoglycemic drugs: Secondary | ICD-10-CM | POA: Diagnosis not present

## 2024-07-07 DIAGNOSIS — E1142 Type 2 diabetes mellitus with diabetic polyneuropathy: Secondary | ICD-10-CM

## 2024-07-07 DIAGNOSIS — Z7985 Long-term (current) use of injectable non-insulin antidiabetic drugs: Secondary | ICD-10-CM | POA: Diagnosis not present

## 2024-07-07 MED ORDER — PEN NEEDLES 32G X 4 MM MISC
2 refills | Status: AC
  Filled 2024-07-07: qty 100, 90d supply, fill #0

## 2024-07-07 MED ORDER — TRULICITY 1.5 MG/0.5ML ~~LOC~~ SOAJ
1.5000 mg | SUBCUTANEOUS | 1 refills | Status: AC
  Filled 2024-07-07: qty 6, 84d supply, fill #0

## 2024-07-07 NOTE — Telephone Encounter (Signed)
 Can we apply for MAP for Trulicity and Lantus ?

## 2024-07-07 NOTE — Progress Notes (Signed)
 "   S:    No chief complaint on file.  73 y.o. male who presents for diabetes evaluation, education, and management. PMH is significant for T2DM, HTN, HLD, CKD Stage III.   Patient was referred by Primary Care Provider, Dr. Delbert, on 06/18/24. A1c was 7.7%, down from >15.5% in September. Dr. Newlin had him continue his current regimen.  Today, patient is in good spirits. He states that he is doing well. He has no major complaints today. Denies any side effects with Ozempic . He endorses adherence to this, Lantus  28 units daily, and Jardiance  once daily. He was recently approved for Jardiance  patient assistance and this is shipped to his home. His Ozempic  MAP program ends at the end of this month.  Family/Social History:  Former smoker No alcohol use  Current diabetes medications include: Lantus  28 units daily, Ozempic  2 mg Shaniko weekly (Weds), Jardiance  10 mg   Patient denies polyuria. Patient denies neuropathy (nerve pain). Patient denies visual changes. Patient reports self foot exams. Everyday.  Patient reported dietary habits: Eats 1-2 meals/day. Sometimes skips meals at work. Reports that he has minimized intake of sugar/carbohydrates. He has also cut back on salt.  Breakfast: sometimes  Lunch: toasted bread, green tea with honey  Dinner: brown rice, tutu, plantains, chicken, fish, spinach, peppers Snacks: does not snack much Drinks: only water (a lot)  Patient-reported exercise habits: no formal exercise outside of work. Walking all day at work.   Reported home CBGs:  70s - 120s at home   O:  BP Readings from Last 3 Encounters:  06/18/24 119/68  04/03/24 (!) 160/84  03/19/24 130/71   Lab Results  Component Value Date   HGBA1C 7.7 (A) 06/18/2024   There were no vitals filed for this visit.  Lipid Panel     Component Value Date/Time   CHOL 159 04/03/2024 1109   TRIG 60 04/03/2024 1109   HDL 74 04/03/2024 1109   CHOLHDL 3.0 11/16/2020 1042   CHOLHDL 2.8 02/29/2016  1018   VLDL 12 02/29/2016 1018   LDLCALC 73 04/03/2024 1109    Clinical Atherosclerotic Cardiovascular Disease (ASCVD): No  The 10-year ASCVD risk score (Arnett DK, et al., 2019) is: 29%   Values used to calculate the score:     Age: 45 years     Clinically relevant sex: Male     Is Non-Hispanic African American: Yes     Diabetic: Yes     Tobacco smoker: No     Systolic Blood Pressure: 119 mmHg     Is BP treated: Yes     HDL Cholesterol: 74 mg/dL     Total Cholesterol: 159 mg/dL    A/P: Diabetes longstanding and currently close to goal with last A1C of 7.7%, down from >15.5% in September of this year. A1c goal < 7%. No recent issues with hypoglycemia. Although being unsymptomatic, patient is able to verbalize appropriate hypoglycemia management plan. Medication adherence appears optimal.  -Continue 2 mg weekly until current supply is exhausted. Thankfully, Trulicity MAP is available for Medicare patients who qualify. We will have him fill out an application today. Once approved he will begin 1.5 mg weekly in place of the Ozempic . -Continue Lantus  28 units daily.  -Continue Jardiance  10 mg daily.  -Patient educated on purpose, proper use, and potential adverse effects of Ozempic .  -Extensively discussed pathophysiology of diabetes, recommended lifestyle interventions, dietary effects on blood sugar control.  -Counseled on s/sx of and management of hypoglycemia.  -Next A1c anticipated  09/2024.  Patient verbalized understanding of treatment plan.  Total time in counseling 30 minutes.    Follow-up:  PCP visit 09/16/24 with Dr. Delbert Pharmacist: in Feb to make sure MAP for Trulicity was approved.   Herlene Fleeta Morris, PharmD, JAQUELINE, CPP Clinical Pharmacist [facility] [phone]   "

## 2024-07-08 ENCOUNTER — Other Ambulatory Visit: Payer: Self-pay

## 2024-07-11 ENCOUNTER — Other Ambulatory Visit: Payer: Self-pay

## 2024-07-14 ENCOUNTER — Other Ambulatory Visit: Payer: Self-pay

## 2024-07-17 ENCOUNTER — Other Ambulatory Visit: Payer: Self-pay

## 2024-07-21 ENCOUNTER — Other Ambulatory Visit: Payer: Self-pay

## 2024-07-21 MED ORDER — FINASTERIDE 5 MG PO TABS
5.0000 mg | ORAL_TABLET | Freq: Every day | ORAL | 3 refills | Status: AC
  Filled 2024-07-21: qty 90, 90d supply, fill #0

## 2024-07-21 MED ORDER — SILDENAFIL CITRATE 100 MG PO TABS
100.0000 mg | ORAL_TABLET | Freq: Every day | ORAL | 11 refills | Status: AC | PRN
  Filled 2024-07-21: qty 15, 15d supply, fill #0

## 2024-07-22 ENCOUNTER — Other Ambulatory Visit: Payer: Self-pay

## 2024-07-29 ENCOUNTER — Other Ambulatory Visit: Payer: Self-pay

## 2024-07-29 ENCOUNTER — Ambulatory Visit: Attending: Family Medicine

## 2024-08-01 ENCOUNTER — Other Ambulatory Visit: Payer: Self-pay

## 2024-08-04 ENCOUNTER — Other Ambulatory Visit: Payer: Self-pay

## 2024-08-13 ENCOUNTER — Other Ambulatory Visit: Payer: Self-pay

## 2024-08-15 ENCOUNTER — Other Ambulatory Visit: Payer: Self-pay

## 2024-09-01 ENCOUNTER — Ambulatory Visit: Payer: Self-pay | Admitting: Pharmacist

## 2024-09-16 ENCOUNTER — Ambulatory Visit: Admitting: Family Medicine
